# Patient Record
Sex: Male | Born: 1940 | Race: White | Hispanic: No | Marital: Married | Smoking: Never smoker
Health system: Southern US, Community
[De-identification: ages and names within clinical notes are randomized; demographics above are authoritative.]

## PROBLEM LIST (undated history)

## (undated) DIAGNOSIS — B029 Zoster without complications: Secondary | ICD-10-CM

## (undated) DIAGNOSIS — D649 Anemia, unspecified: Secondary | ICD-10-CM

## (undated) DIAGNOSIS — I5030 Unspecified diastolic (congestive) heart failure: Secondary | ICD-10-CM

## (undated) DIAGNOSIS — I482 Chronic atrial fibrillation, unspecified: Secondary | ICD-10-CM

## (undated) DIAGNOSIS — Z96641 Presence of right artificial hip joint: Secondary | ICD-10-CM

## (undated) DIAGNOSIS — R600 Localized edema: Secondary | ICD-10-CM

## (undated) HISTORY — PX: TOTAL HIP ARTHROPLASTY: SHX124

## (undated) HISTORY — PX: APPENDECTOMY: SHX54

## (undated) HISTORY — PX: CHOLECYSTECTOMY: SHX55

---

## 1998-08-22 ENCOUNTER — Encounter: Payer: Self-pay | Admitting: General Surgery

## 1998-08-27 ENCOUNTER — Ambulatory Visit (HOSPITAL_COMMUNITY): Admission: RE | Admit: 1998-08-27 | Discharge: 1998-08-27 | Payer: Self-pay | Admitting: General Surgery

## 2000-07-31 ENCOUNTER — Encounter: Payer: Self-pay | Admitting: Emergency Medicine

## 2000-07-31 ENCOUNTER — Emergency Department (HOSPITAL_COMMUNITY): Admission: EM | Admit: 2000-07-31 | Discharge: 2000-07-31 | Payer: Self-pay | Admitting: Emergency Medicine

## 2001-12-19 ENCOUNTER — Inpatient Hospital Stay (HOSPITAL_COMMUNITY): Admission: EM | Admit: 2001-12-19 | Discharge: 2001-12-21 | Payer: Self-pay | Admitting: *Deleted

## 2002-01-16 ENCOUNTER — Inpatient Hospital Stay (HOSPITAL_COMMUNITY): Admission: EM | Admit: 2002-01-16 | Discharge: 2002-01-25 | Payer: Self-pay | Admitting: Emergency Medicine

## 2002-02-20 ENCOUNTER — Ambulatory Visit (HOSPITAL_COMMUNITY): Admission: RE | Admit: 2002-02-20 | Discharge: 2002-02-20 | Payer: Self-pay | Admitting: *Deleted

## 2002-02-20 ENCOUNTER — Encounter: Payer: Self-pay | Admitting: *Deleted

## 2002-06-01 ENCOUNTER — Encounter: Payer: Self-pay | Admitting: Endocrinology

## 2002-06-01 ENCOUNTER — Ambulatory Visit (HOSPITAL_COMMUNITY): Admission: RE | Admit: 2002-06-01 | Discharge: 2002-06-01 | Payer: Self-pay | Admitting: Endocrinology

## 2002-06-04 ENCOUNTER — Emergency Department (HOSPITAL_COMMUNITY): Admission: EM | Admit: 2002-06-04 | Discharge: 2002-06-04 | Payer: Self-pay | Admitting: *Deleted

## 2003-08-27 ENCOUNTER — Ambulatory Visit (HOSPITAL_BASED_OUTPATIENT_CLINIC_OR_DEPARTMENT_OTHER): Admission: RE | Admit: 2003-08-27 | Discharge: 2003-08-27 | Payer: Self-pay | Admitting: Cardiology

## 2003-10-12 ENCOUNTER — Ambulatory Visit (HOSPITAL_COMMUNITY): Admission: AD | Admit: 2003-10-12 | Discharge: 2003-10-14 | Payer: Self-pay | Admitting: Internal Medicine

## 2003-12-11 ENCOUNTER — Inpatient Hospital Stay (HOSPITAL_COMMUNITY): Admission: AD | Admit: 2003-12-11 | Discharge: 2003-12-14 | Payer: Self-pay | Admitting: Internal Medicine

## 2004-08-07 ENCOUNTER — Ambulatory Visit: Payer: Self-pay | Admitting: Cardiology

## 2004-09-19 ENCOUNTER — Ambulatory Visit: Payer: Self-pay | Admitting: Internal Medicine

## 2005-01-15 ENCOUNTER — Ambulatory Visit: Payer: Self-pay

## 2005-02-26 ENCOUNTER — Ambulatory Visit: Payer: Self-pay | Admitting: Cardiology

## 2005-04-06 ENCOUNTER — Ambulatory Visit: Payer: Self-pay | Admitting: Cardiology

## 2005-04-23 ENCOUNTER — Ambulatory Visit: Payer: Self-pay | Admitting: Internal Medicine

## 2005-06-23 ENCOUNTER — Ambulatory Visit: Payer: Self-pay | Admitting: *Deleted

## 2005-08-19 ENCOUNTER — Ambulatory Visit: Payer: Self-pay | Admitting: Cardiology

## 2005-09-02 ENCOUNTER — Ambulatory Visit: Payer: Self-pay | Admitting: Cardiology

## 2005-10-07 ENCOUNTER — Ambulatory Visit: Payer: Self-pay | Admitting: Cardiovascular Disease

## 2005-11-25 ENCOUNTER — Ambulatory Visit: Payer: Self-pay | Admitting: Cardiology

## 2006-01-08 ENCOUNTER — Ambulatory Visit: Payer: Self-pay | Admitting: Internal Medicine

## 2006-02-24 ENCOUNTER — Ambulatory Visit: Payer: Self-pay | Admitting: Cardiology

## 2006-04-12 ENCOUNTER — Ambulatory Visit: Payer: Self-pay | Admitting: Cardiology

## 2006-05-21 ENCOUNTER — Ambulatory Visit: Payer: Self-pay | Admitting: Cardiology

## 2006-07-09 ENCOUNTER — Ambulatory Visit: Payer: Self-pay | Admitting: Internal Medicine

## 2006-09-17 ENCOUNTER — Ambulatory Visit: Payer: Self-pay | Admitting: Cardiology

## 2006-11-05 ENCOUNTER — Ambulatory Visit: Payer: Self-pay | Admitting: Internal Medicine

## 2006-11-12 ENCOUNTER — Ambulatory Visit: Payer: Self-pay | Admitting: Internal Medicine

## 2006-11-23 ENCOUNTER — Encounter: Payer: Self-pay | Admitting: Internal Medicine

## 2006-11-23 ENCOUNTER — Inpatient Hospital Stay (HOSPITAL_COMMUNITY): Admission: EM | Admit: 2006-11-23 | Discharge: 2006-11-24 | Payer: Self-pay | Admitting: Emergency Medicine

## 2006-11-23 ENCOUNTER — Ambulatory Visit: Payer: Self-pay | Admitting: Internal Medicine

## 2006-12-20 ENCOUNTER — Ambulatory Visit: Payer: Self-pay | Admitting: Cardiovascular Disease

## 2006-12-20 ENCOUNTER — Ambulatory Visit: Payer: Self-pay | Admitting: Internal Medicine

## 2007-02-11 ENCOUNTER — Ambulatory Visit: Payer: Self-pay | Admitting: Cardiovascular Disease

## 2007-04-01 ENCOUNTER — Ambulatory Visit: Payer: Self-pay | Admitting: Cardiology

## 2007-04-29 ENCOUNTER — Ambulatory Visit: Payer: Self-pay | Admitting: Cardiology

## 2007-05-27 ENCOUNTER — Ambulatory Visit: Payer: Self-pay | Admitting: Cardiology

## 2007-06-23 ENCOUNTER — Ambulatory Visit: Payer: Self-pay | Admitting: Cardiology

## 2007-06-23 ENCOUNTER — Ambulatory Visit: Payer: Self-pay | Admitting: Internal Medicine

## 2007-06-23 LAB — CONVERTED CEMR LAB
BUN: 23 mg/dL (ref 6–23)
Basophils Relative: 0.8 % (ref 0.0–1.0)
CO2: 27 meq/L (ref 19–32)
Chloride: 109 meq/L (ref 96–112)
Creatinine, Ser: 1.3 mg/dL (ref 0.4–1.5)
Eosinophils Absolute: 0.2 10*3/uL (ref 0.0–0.6)
Eosinophils Relative: 3.4 % (ref 0.0–5.0)
GFR calc Af Amer: 71 mL/min
GFR calc non Af Amer: 59 mL/min
Glucose, Bld: 176 mg/dL — ABNORMAL HIGH (ref 70–99)
HCT: 32.7 % — ABNORMAL LOW (ref 39.0–52.0)
Hemoglobin: 11.4 g/dL — ABNORMAL LOW (ref 13.0–17.0)
INR: 1.9 — ABNORMAL HIGH (ref 0.8–1.0)
Lymphocytes Relative: 22.8 % (ref 12.0–46.0)
MCHC: 35 g/dL (ref 30.0–36.0)
MCV: 92.7 fL (ref 78.0–100.0)
Monocytes Absolute: 0.5 10*3/uL (ref 0.2–0.7)
Monocytes Relative: 8.8 % (ref 3.0–11.0)
Neutro Abs: 3.5 10*3/uL (ref 1.4–7.7)
Neutrophils Relative %: 64.2 % (ref 43.0–77.0)
Platelets: 167 10*3/uL (ref 150–400)
Potassium: 3.9 meq/L (ref 3.5–5.1)
Prothrombin Time: 16.9 s — ABNORMAL HIGH (ref 10.9–13.3)
RBC: 3.53 M/uL — ABNORMAL LOW (ref 4.22–5.81)
Sodium: 142 meq/L (ref 135–145)
WBC: 5.5 10*3/uL (ref 4.5–10.5)
aPTT: 32.7 s — ABNORMAL HIGH (ref 21.7–29.8)

## 2007-06-27 ENCOUNTER — Ambulatory Visit: Payer: Self-pay | Admitting: Cardiology

## 2007-07-08 ENCOUNTER — Ambulatory Visit: Payer: Self-pay | Admitting: Cardiovascular Disease

## 2007-08-11 ENCOUNTER — Ambulatory Visit: Payer: Self-pay | Admitting: Cardiology

## 2007-09-08 ENCOUNTER — Ambulatory Visit: Payer: Self-pay | Admitting: Cardiology

## 2007-10-28 ENCOUNTER — Ambulatory Visit: Payer: Self-pay | Admitting: Internal Medicine

## 2008-01-06 ENCOUNTER — Ambulatory Visit: Payer: Self-pay | Admitting: Internal Medicine

## 2008-01-13 ENCOUNTER — Ambulatory Visit: Payer: Self-pay | Admitting: Cardiology

## 2008-02-23 ENCOUNTER — Ambulatory Visit: Payer: Self-pay | Admitting: Internal Medicine

## 2008-04-13 ENCOUNTER — Ambulatory Visit: Payer: Self-pay | Admitting: Cardiovascular Disease

## 2008-06-01 ENCOUNTER — Ambulatory Visit: Payer: Self-pay | Admitting: Internal Medicine

## 2008-07-02 ENCOUNTER — Ambulatory Visit: Payer: Self-pay | Admitting: Internal Medicine

## 2008-07-20 ENCOUNTER — Ambulatory Visit: Payer: Self-pay | Admitting: Cardiology

## 2008-09-19 ENCOUNTER — Ambulatory Visit: Payer: Self-pay | Admitting: Cardiology

## 2008-11-16 ENCOUNTER — Ambulatory Visit: Payer: Self-pay | Admitting: Internal Medicine

## 2008-11-26 ENCOUNTER — Ambulatory Visit: Payer: Self-pay | Admitting: Internal Medicine

## 2008-12-28 ENCOUNTER — Ambulatory Visit: Payer: Self-pay | Admitting: Cardiology

## 2009-01-15 ENCOUNTER — Telehealth (INDEPENDENT_AMBULATORY_CARE_PROVIDER_SITE_OTHER): Payer: Self-pay | Admitting: *Deleted

## 2009-01-15 DIAGNOSIS — I4891 Unspecified atrial fibrillation: Secondary | ICD-10-CM

## 2009-01-15 HISTORY — DX: Unspecified atrial fibrillation: I48.91

## 2009-01-29 ENCOUNTER — Telehealth (INDEPENDENT_AMBULATORY_CARE_PROVIDER_SITE_OTHER): Payer: Self-pay | Admitting: Radiology

## 2009-01-30 ENCOUNTER — Encounter: Payer: Self-pay | Admitting: Cardiology

## 2009-01-30 ENCOUNTER — Ambulatory Visit: Payer: Self-pay

## 2009-01-31 ENCOUNTER — Ambulatory Visit: Payer: Self-pay

## 2009-02-06 ENCOUNTER — Telehealth (INDEPENDENT_AMBULATORY_CARE_PROVIDER_SITE_OTHER): Payer: Self-pay | Admitting: *Deleted

## 2009-02-06 DIAGNOSIS — E1321 Other specified diabetes mellitus with diabetic nephropathy: Secondary | ICD-10-CM | POA: Insufficient documentation

## 2009-02-06 DIAGNOSIS — I10 Essential (primary) hypertension: Secondary | ICD-10-CM | POA: Insufficient documentation

## 2009-02-06 DIAGNOSIS — E669 Obesity, unspecified: Secondary | ICD-10-CM

## 2009-02-06 DIAGNOSIS — E119 Type 2 diabetes mellitus without complications: Secondary | ICD-10-CM

## 2009-02-06 HISTORY — DX: Other specified diabetes mellitus with diabetic nephropathy: E13.21

## 2009-02-06 HISTORY — DX: Essential (primary) hypertension: I10

## 2009-02-06 HISTORY — DX: Obesity, unspecified: E66.9

## 2009-02-07 ENCOUNTER — Ambulatory Visit: Payer: Self-pay | Admitting: Internal Medicine

## 2009-02-07 DIAGNOSIS — I5032 Chronic diastolic (congestive) heart failure: Secondary | ICD-10-CM

## 2009-02-07 HISTORY — DX: Chronic diastolic (congestive) heart failure: I50.32

## 2009-02-15 ENCOUNTER — Ambulatory Visit: Payer: Self-pay | Admitting: Cardiology

## 2009-02-15 LAB — CONVERTED CEMR LAB
POC INR: 3.2
Protime: 21.6

## 2009-02-19 ENCOUNTER — Encounter: Payer: Self-pay | Admitting: *Deleted

## 2009-03-27 ENCOUNTER — Encounter: Payer: Self-pay | Admitting: *Deleted

## 2009-04-30 ENCOUNTER — Encounter: Payer: Self-pay | Admitting: Cardiology

## 2009-05-07 ENCOUNTER — Encounter (INDEPENDENT_AMBULATORY_CARE_PROVIDER_SITE_OTHER): Payer: Self-pay | Admitting: *Deleted

## 2009-06-07 ENCOUNTER — Ambulatory Visit: Payer: Self-pay | Admitting: Internal Medicine

## 2009-06-07 LAB — CONVERTED CEMR LAB: POC INR: 2.9

## 2009-07-09 ENCOUNTER — Ambulatory Visit: Payer: Self-pay | Admitting: Cardiology

## 2009-07-09 ENCOUNTER — Ambulatory Visit: Payer: Self-pay | Admitting: Internal Medicine

## 2009-07-09 LAB — CONVERTED CEMR LAB: POC INR: 2.5

## 2009-07-18 ENCOUNTER — Telehealth: Payer: Self-pay | Admitting: Internal Medicine

## 2009-08-27 ENCOUNTER — Ambulatory Visit: Payer: Self-pay | Admitting: Cardiovascular Disease

## 2009-08-27 LAB — CONVERTED CEMR LAB: POC INR: 1.6

## 2009-10-15 ENCOUNTER — Ambulatory Visit: Payer: Self-pay | Admitting: Cardiovascular Disease

## 2009-10-15 LAB — CONVERTED CEMR LAB
INR: 2.5
POC INR: 2.5

## 2009-11-18 ENCOUNTER — Encounter: Payer: Self-pay | Admitting: Internal Medicine

## 2009-12-03 ENCOUNTER — Ambulatory Visit: Payer: Self-pay | Admitting: Cardiology

## 2009-12-03 LAB — CONVERTED CEMR LAB: POC INR: 3.1

## 2009-12-18 ENCOUNTER — Ambulatory Visit: Payer: Self-pay | Admitting: Internal Medicine

## 2009-12-18 ENCOUNTER — Inpatient Hospital Stay (HOSPITAL_COMMUNITY): Admission: EM | Admit: 2009-12-18 | Discharge: 2009-12-20 | Payer: Self-pay | Admitting: Emergency Medicine

## 2009-12-19 ENCOUNTER — Encounter: Payer: Self-pay | Admitting: Internal Medicine

## 2009-12-24 ENCOUNTER — Ambulatory Visit: Payer: Self-pay | Admitting: Internal Medicine

## 2009-12-25 ENCOUNTER — Telehealth: Payer: Self-pay | Admitting: Internal Medicine

## 2009-12-26 ENCOUNTER — Encounter: Payer: Self-pay | Admitting: Internal Medicine

## 2010-01-01 ENCOUNTER — Encounter: Payer: Self-pay | Admitting: Internal Medicine

## 2010-02-06 ENCOUNTER — Encounter (INDEPENDENT_AMBULATORY_CARE_PROVIDER_SITE_OTHER): Payer: Self-pay | Admitting: Pharmacist

## 2010-04-01 ENCOUNTER — Telehealth: Payer: Self-pay | Admitting: Internal Medicine

## 2010-04-01 ENCOUNTER — Encounter (INDEPENDENT_AMBULATORY_CARE_PROVIDER_SITE_OTHER): Payer: Self-pay | Admitting: Pharmacist

## 2010-04-07 ENCOUNTER — Telehealth (INDEPENDENT_AMBULATORY_CARE_PROVIDER_SITE_OTHER): Payer: Self-pay

## 2010-04-22 ENCOUNTER — Ambulatory Visit: Payer: Self-pay | Admitting: Cardiology

## 2010-04-22 LAB — CONVERTED CEMR LAB: POC INR: 1.5

## 2010-04-28 ENCOUNTER — Telehealth: Payer: Self-pay | Admitting: Internal Medicine

## 2010-06-10 ENCOUNTER — Ambulatory Visit: Payer: Self-pay | Admitting: Cardiology

## 2010-06-10 ENCOUNTER — Ambulatory Visit: Payer: Self-pay | Admitting: Internal Medicine

## 2010-06-10 LAB — CONVERTED CEMR LAB: POC INR: 1.6

## 2010-06-11 ENCOUNTER — Encounter: Payer: Self-pay | Admitting: Internal Medicine

## 2010-06-25 ENCOUNTER — Telehealth: Payer: Self-pay | Admitting: Internal Medicine

## 2010-10-17 ENCOUNTER — Encounter
Admission: RE | Admit: 2010-10-17 | Discharge: 2010-10-17 | Payer: Self-pay | Source: Home / Self Care | Attending: Endocrinology | Admitting: Endocrinology

## 2010-10-19 LAB — CONVERTED CEMR LAB
BUN: 27 mg/dL — ABNORMAL HIGH (ref 6–23)
CO2: 28 meq/L (ref 19–32)
Calcium: 9.8 mg/dL (ref 8.4–10.5)
Chloride: 103 meq/L (ref 96–112)
Creatinine, Ser: 1.5 mg/dL (ref 0.4–1.5)
GFR calc non Af Amer: 48.92 mL/min (ref >60)
Glucose, Bld: 96 mg/dL (ref 70–99)
Magnesium: 2.1 mg/dL (ref 1.5–2.5)
Potassium: 5 meq/L (ref 3.5–5.1)
Sodium: 139 meq/L (ref 135–145)

## 2010-10-23 NOTE — Medication Information (Signed)
Summary: coumadin check/mt  Anticoagulant Therapy  Managed by: Porfirio Oar, PharmD Referring MD: Parthenia Ames MD: Olevia Perches MD, Bruce Indication 1: Atrial Fibrillation (ICD-427.31) Lab Used: [facility] Site: [facility] INR POC 1.5 INR RANGE 2 - 3  Dietary changes: no    Health status changes: no    Bleeding/hemorrhagic complications: no    Recent/future hospitalizations: no    Any changes in medication regimen? no    Recent/future dental: no  Any missed doses?: no       Is patient compliant with meds? yes      Comments: Pt will be gone to [place] until September.  Will have neighbor check INR in 1-2 weeks.    Allergies: 1)  ! * Ivp Dye 2)  ! Sulfa  Anticoagulation Management History:      The patient is taking warfarin and comes in today for a routine follow up visit.  Positive risk factors for bleeding include an age of 70 years or older and presence of serious comorbidities.  The bleeding index is 'intermediate risk'.  Positive CHADS2 values include History of CHF, History of HTN, and History of Diabetes.  Negative CHADS2 values include Age > 21 years old.  The start date was 12/20/2001.  His last INR was 2.5.  Anticoagulation responsible provider: Olevia Perches MD, Darnell Level.  INR POC: 1.5.  Cuvette Lot#: [id number].  Exp: 06/2011.    Anticoagulation Management Assessment/Plan:      The patient's current anticoagulation dose is Coumadin 5 mg tabs: Take as directed by coumadin clinic..  The target INR is 2 - 3.  The next INR is due 06/10/2010.  Anticoagulation instructions were given to patient.  Results were reviewed/authorized by Porfirio Oar, PharmD.  He was notified by Porfirio Oar PharmD.         Prior Anticoagulation Instructions: INR 3.1  Hold today's dose.  Then continue taking 1 tablet daily.  Return to clinic in 7 weeks.   Current Anticoagulation Instructions: INR 1.5  Take 2 tablets today, 1 1/2 tablets on Wednesday then resume same dose of 1 tablet every day.     Prescriptions: COUMADIN 5 MG TABS (WARFARIN SODIUM) Take as directed by coumadin clinic.  #90 x 1   Entered by:   Porfirio Oar PharmD   Authorized by:   Nikki Dom, MD, [facility]   Signed by:   Porfirio Oar PharmD on 04/22/2010   Method used:   Electronically to        [facility]* (retail)             ,          Ph: [phone]       Fax: [phone]   RxID:   [id number]

## 2010-10-23 NOTE — Letter (Signed)
Summary: [facility]  [facility]   Imported By: Marilynne Drivers 03/19/2010 09:27:15  _____________________________________________________________________  External Attachment:    Type:   Image     Comment:   External Document

## 2010-10-23 NOTE — Letter (Signed)
Summary: [facility] - Delinquent Coumadin 1  Coumadin  [address] [place]   [place], [place] [address]   Phone: [phone]  Fax: [phone]     Feb 06, 2010 MRN: [id number]   [place], [place]  [address]   Dear Eric [name],  This letter is being sent to you as a reminder that it is necessary for you to get your INR/PT checked regularly so that we can optimize your care.  Our records indicate that you were scheduled to have a test done recently.  As of today, we have not received the results of this test.  It is very important that you have your INR checked.  Please call our office at the number listed above to schedule an appointment at your earliest convenience.    If you have recently had your protime checked or have discontinued this medication, please contact our office at the above phone number to clarify this issue.  Thank you for this prompt attention to this important health care matter.  Sincerely,   [facility]

## 2010-10-23 NOTE — Medication Information (Signed)
Summary: rov/sp  Anticoagulant Therapy  Managed by: Mammie Lorenzo, PharmD Referring MD: Parthenia Ames MD: Verl Blalock MD,Thomas Indication 1: Atrial Fibrillation (ICD-427.31) Lab Used: [place] Site: [facility] INR POC 1.6 INR RANGE 2 - 3  Dietary changes: no    Health status changes: no    Bleeding/hemorrhagic complications: no    Recent/future hospitalizations: no    Any changes in medication regimen? no    Recent/future dental: no  Any missed doses?: no       Is patient compliant with meds? yes      Comments: Patient lives out of town. 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 Strongly advised him to come back sooner than 7 weeks so we can recheck. He declined and stated that 7 weeks was the best he could do. Has a retired physician that lives next door with an INR machine. Told him to have the physician check it and to call us with the result in 2 weeks to make sure he is back up.   Allergies: 1)  ! * Ivp Dye 2)  ! Sulfa  Anticoagulation Management History:      The patient is taking warfarin and comes in today for a routine follow up visit.  Positive risk factors for bleeding include an age of 89 years or older and presence of serious comorbidities.  The bleeding index is 'intermediate risk'.  Positive CHADS2 values include History of CHF, History of HTN, and History of Diabetes.  Negative CHADS2 values include Age > 2 years old.  The start date was 12/20/2001.  His last INR was 2.5.  Anticoagulation responsible Clarrissa Shimkus: Wall MD,Thomas.  INR POC: 1.6.  Cuvette Lot#: [id number].  Exp: 06/2011.    Anticoagulation Management Assessment/Plan:      The patient's current anticoagulation dose is Coumadin 5 mg tabs: Take as directed by coumadin clinic..  The target INR is 2 - 3.  The next INR is due 07/29/2010.  Anticoagulation instructions were given to patient.  Results were reviewed/authorized by Mammie Lorenzo, PharmD.         Prior Anticoagulation Instructions: INR  1.5  Take 2 tablets today, 1 1/2 tablets on Wednesday then resume same dose of 1 tablet every day.    Current Anticoagulation Instructions: INR 1.6  Take 2 tablets today as a 1 time booster. Change your wednesday dose to 1.5 tablets instead of just 1 tablet. We'll see you in 7 weeks.

## 2010-10-23 NOTE — Assessment & Plan Note (Signed)
Summary: 4 month return.amber   Visit Type:  Follow-up Referring Martise Waddell:  Elsie Saas Primary Tonnie Friedel:  Margaretmary Dys   History of Present Illness: Eric [name] is seen in followup for atrial fibrillation occurring in the context of diabetes hypertension and morbid obesity. He is taking Tikosyn.   Review of hospital  records and office labs demonstrates no K since April   He  has been actively engaged in physical therapy and has lost 30 lbs  His goal is anothe 50   His atrial fibrillation is quite paroxysmal; he is quite well able to discern when he is in sinus and when he is not. He does not have significant symptoms associated with his episodes of atrial fibrillation; this dancing distinction to his presentation 7 or 8 years ago when he was quite sick with atrial fibrillation. His records will need to be reviewed   Current Medications (verified): 1)  Tikosyn 250 Mcg Caps (Dofetilide) .... Take One Tablet Every 12 Hours 2)  Celebrex 200 Mg Caps (Celecoxib) .... Take One Tablet Once Daily 3)  Simvastatin 80 Mg Tabs (Simvastatin) .... Take One Tablet Daily With Dinner Meal 4)  Diltiazem Hcl Cr 240 Mg Xr24h-Cap (Diltiazem Hcl) .... Take One Tablet Two Times A Day 5)  Levoxyl 75 Mcg Tabs (Levothyroxine Sodium) .... Take One Tablet Once Daily 6)  Spironolactone 50 Mg Tabs (Spironolactone) .... Take One Tablet Once Daily 7)  Actos 45 Mg Tabs (Pioglitazone Hcl) .... Take One Tablet Once Daily 8)  Furosemide 40 Mg Tabs (Furosemide) .... Take One Tablet Am 9)  Allopurinol 300 Mg Tabs (Allopurinol) .... Take One Tablet Once Daily 10)  Omeprazole 20 Mg Cpdr (Omeprazole) .... Take One Tablet Once Daily 11)  Lantus 100 Unit/ml Soln (Insulin Glargine) .... Take 34 Units Once Daily 12)  Novolog 100 Unit/ml Soln (Insulin Aspart) .... Take 12 Units Before Every Meal 13)  Aspirin Adult Low Strength 81 Mg Tbec (Aspirin) .... Take One Tablet Once Daily 14)  Eq Fiber Therapy 500 Mg Tabs  (Methylcellulose (Laxative)) .... Take 2 Tablets Daily 15)  Multivitamins  Tabs (Multiple Vitamin) .... Take One Tablet Once Daily 16)  Mag-Oxide 400 Mg Tabs (Magnesium Oxide) .... Take 2 Tablets With Breakfast 17)  Calcium 600 600 Mg Tabs (Calcium Carbonate) .... Take One Tablet Once Daily 18)  Tylenol Pm Extra Strength 500-25 Mg Tabs (Diphenhydramine-Apap (Sleep)) .... Take Two Tablets At Bedtime 19)  Coumadin 5 Mg Tabs (Warfarin Sodium) .... Take As Directed By Coumadin Clinic.  Allergies: 1)  ! * Ivp Dye 2)  ! Sulfa  Past History:  Past Medical History: Last updated: 02/06/2009  1. Cardiac catheterization, March 2003, 30% proximal LAD, 60% mid LAD,      30% mid RCA, LV pressure 138/38.  2. Echocardiogram, May 2003, left ventricular ejection fraction 50% to      55%, dilated left atrium, mild pulmonary regurgitation.  3. 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  4. History of congestive heart failure, likely secondary to diastolic      dysfunction.  5. Diabetes mellitus type 2.  6. Hypertension.  7. Obstructive sleep apnea.  8. Gout.  9. Hypothyroidism.  Vital Signs:  Patient profile:   70 year old male Height:      72 inches Weight:      297 pounds BMI:     40.43 Pulse rate:   60 / minute BP  sitting:   132 / 60  (left arm)  Vitals Entered By: Margaretmary Bayley CMA (June 10, 2010 3:22 PM)  Physical Exam  General:  The patient morbidly obese was alert and oriented in no acute distress. HEENT Normal.  Neck veins were flat, carotids were brisk.  Lungs were clear.  Heart sounds were regular without murmurs or gallops.  Abdomen was soft with active bowel sounds. There is no clubbing cyanosis or edema. Skin Warm and dry with venous insufficiency changes    EKG  Procedure date:  06/10/2010  Findings:      sinus rhythm at 60 Intervals 0.21/0.10/0.44 Otherwise  normal  Impression & Recommendations:  Problem # 1:  ATRIAL FIBRILLATION (ICD-427.31) Mr. Hundertmark will continue on Tikosyn. We will check his magnesium and b-met for Tikosyn.  a long discussion today regarding Coumadin versus Pradaxa including the risks and benefits. He would like to begin to take Pradaxa. We have given him one month of samples.  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) heart failure is stable at this point he is euvolemic Orders: TLB-BMP (Basic Metabolic Panel-BMET) (99991111) TLB-Magnesium (Mg) (83735-MG)  Problem # 3:  ESSENTIAL HYPERTENSION, BENIGN (ICD-401.1) blood pressure is stable; will continue current meds  Orders: TLB-BMP (Basic Metabolic Panel-BMET) (99991111) TLB-Magnesium (Mg) (83735-MG)  Problem # 4:  OBESITY (ICD-278.00) continue to work on losing weight  Patient Instructions: 1)  Your physician recommends that you schedule a follow-up appointment in: 6 months 2)  Your physician has recommended you make the following change in your medication: stop aspirin and began Pradaxa side effects reviewed

## 2010-10-23 NOTE — Medication Information (Signed)
Summary: rov/ez  Anticoagulant Therapy  Managed by: Margaretha Sheffield, PharmD Referring MD: Parthenia Ames MD: Ron Parker MD, Dellis Filbert Indication 1: Atrial Fibrillation (ICD-427.31) Lab Used: [facility] Site: [facility] INR POC 3.1 INR RANGE 2 - 3  Dietary changes: no    Health status changes: no    Bleeding/hemorrhagic complications: no    Recent/future hospitalizations: no    Any changes in medication regimen? no    Recent/future dental: no  Any missed doses?: no       Is patient compliant with meds? yes       Allergies: 1)  ! * Ivp Dye  Anticoagulation Management History:      The patient is taking warfarin and comes in today for a routine follow up visit.  Positive risk factors for bleeding include an age of 70 years or older and presence of serious comorbidities.  The bleeding index is 'intermediate risk'.  Positive CHADS2 values include History of CHF, History of HTN, and History of Diabetes.  Negative CHADS2 values include Age > 70 years old.  The start date was 12/20/2001.  His last INR was 2.5.  Anticoagulation responsible provider: Ron Parker MD, Dellis Filbert.  INR POC: 3.1.  Cuvette Lot#: [id number].  Exp: 01/2011.    Anticoagulation Management Assessment/Plan:      The patient's current anticoagulation dose is Coumadin 5 mg tabs: Take as directed by coumadin clinic..  The target INR is 2 - 3.  The next INR is due 01/21/2010.  Anticoagulation instructions were given to patient.  Results were reviewed/authorized by Margaretha Sheffield, PharmD.  He was notified by Margaretha Sheffield.         Prior Anticoagulation Instructions: INR: 2.5 Continue with same dosage of 5mg  tablet daily Recheck in 4 weeks Patient would like to come in 7 weeks because lives in [place] and has a Dr to check INR every 3 weeks  Current Anticoagulation Instructions: INR 3.1  Hold today's dose.  Then continue taking 1 tablet daily.  Return to clinic in 7 weeks.

## 2010-10-23 NOTE — Letter (Signed)
Summary: Physical Therapy Form  Physical Therapy Form   Imported By: Sallee Provencal 12/31/2009 14:10:55  _____________________________________________________________________  External Attachment:    Type:   Image     Comment:   External Document

## 2010-10-23 NOTE — Progress Notes (Signed)
Summary: refill meds  Phone Note Call from Patient Call back at Home Phone [phone]   Caller: Patient Reason for Call: Talk to Nurse Summary of Call: per pt he is on pradaxa. pt needs his pradaxa called [facility]. he was taking samples for now. please call in ASAP. He has about 12 days left take 7 days to get this to him Initial call taken by: Lorraine Lax,  June 25, 2010 3:35 PM  Follow-up for Phone Call        06/25/10 1600 pt line busy. Joan Flores, RN, BSN  Additional Follow-up for Phone Call Additional follow up Details #1::        pt meds ordered. Joan Flores, RN, BSN    New/Updated Medications: PRADAXA 150 MG CAPS (DABIGATRAN ETEXILATE MESYLATE) two times a day Prescriptions: PRADAXA 150 MG CAPS (DABIGATRAN ETEXILATE MESYLATE) two times a day  #180 x 3   Entered by:   Joan Flores RN   Authorized by:   Nikki Dom, MD, [employer]   Signed by:   Joan Flores RN on 06/25/2010   Method used:   Electronically to        [facility]* (retail)             ,          Ph: [phone]       Fax: [phone]   RxID:   [id number]

## 2010-10-23 NOTE — Progress Notes (Signed)
Summary: wants letter to get off jury duty  Phone Note Call from Patient   Caller: Patient Reason for Call: Talk to Nurse Summary of Call: pt calling to see if he can get a letter to get off jury duty-pls call [phone] Initial call taken by: Lorenda Hatchet,  April 28, 2010 11:00 AM  Follow-up for Phone Call        pt would like letter to excuse him from jury duty, he is sch to report on 8/30 to [facility], his Juror # [phone], will have Dr Caryl Comes completed and pt request letter then be mailed to him at his home so he can attach it to his form to mail in [place], RN  April 28, 2010 12:10 PM   Additional Follow-up for Phone Call Additional follow up Details #1::        not sure taht medical justifiaton for relaease is indicated  thanks Additional Follow-up by: Nikki Dom, MD, [facility],  April 28, 2010 2:05 PM    Additional Follow-up for Phone Call Additional follow up Details #2::    pt aware Fredia Beets, RN  April 29, 2010 11:12 AM

## 2010-10-23 NOTE — Letter (Signed)
Summary: [facility] - Delinquent Coumadin 2  Coumadin  [address] [place]   [place], [place] [address]   Phone: [phone]  Fax: [phone]     April 01, 2010 MRN: [id number]   Eric [name],   [id number]   Dear [name],  We have attempted to contact you by phone and letter on multiple occasions to contact our office for important blood work associated with the blood thinner, warfarin (Coumadin).  Warfarin is a very important drug that can cause life threatening side effects including, bleeding, and thus requires close laboratory monitoring.  We are unable to accept responsibility for blood thinner-related health problems you may develop because you have not followed our recommendations for appropriate monitoring.  These may include abnormal bleeding occurrences and/or development of blood clots (stroke, heart attack, blood clots in legs or lungs, etc.).  We need for you to contact this office at the number listed above to schedule and complete this very important blood work.  Thank you for your assistance in this urgent matter.  Sincerely,  [facility]

## 2010-10-23 NOTE — Progress Notes (Signed)
Summary: refill meds  Phone Note Refill Request Call back at Home Phone [phone] Message from:  Patient on April 01, 2010 11:27 AM  Refills Requested: Medication #1:  TIKOSYN 250 MCG CAPS take one tablet every 12 hours   Supply Requested: 3 months  Medication #2:  FUROSEMIDE 40 MG TABS take one tablet am   Supply Requested: 3 months [facility] mail order 90 day supply with 3 refill    Method Requested: Fax to [facility] Initial call taken by: Neil Crouch,  April 01, 2010 11:27 AM  Follow-up for Phone Call        Rx faxed to pharmacy Follow-up by: Sidney Ace,  April 01, 2010 2:11 PM    Prescriptions: FUROSEMIDE 40 MG TABS (FUROSEMIDE) take one tablet am  #90 Tablet x 2   Entered by:   Sidney Ace   Authorized by:   Nikki Dom, MD, [facility]   Signed by:   Sidney Ace on 04/01/2010   Method used:   Faxed to ...       [facility]* (retail)             ,          Ph: [phone]       Fax: [phone]   RxID:   [id number] TIKOSYN 250 MCG CAPS (DOFETILIDE) take one tablet every 12 hours  #180 Capsule x 0   Entered by:   Sidney Ace   Authorized by:   Nikki Dom, MD, [facility]   Signed by:   Sidney Ace on 04/01/2010   Method used:   Faxed to ...       [facility] (retail)             ,          Ph: [phone]       Fax: [phone]   RxID:   [id number]

## 2010-10-23 NOTE — Medication Information (Signed)
Summary: rov/ewj  Anticoagulant Therapy  Managed by: Alinda Deem, PharmD, BCPS, CPP Referring MD: Parthenia Ames MD: Burt Knack MD, Legrand Como Indication 1: Atrial Fibrillation (ICD-427.31) Lab Used: [facility] Site: [facility] INR POC 2.5 INR RANGE 2 - 3  Dietary changes: no    Health status changes: no    Bleeding/hemorrhagic complications: no    Recent/future hospitalizations: no    Any changes in medication regimen? no    Recent/future dental: no  Any missed doses?: no       Is patient compliant with meds? yes       Allergies (verified): 1)  ! * Ivp Dye  Anticoagulation Management History:      The patient is taking warfarin and comes in today for a routine follow up visit.  Positive risk factors for bleeding include an age of 70 years or older and presence of serious comorbidities.  The bleeding index is 'intermediate risk'.  Positive CHADS2 values include History of CHF, History of HTN, and History of Diabetes.  Negative CHADS2 values include Age > 39 years old.  The start date was 12/20/2001.  His last INR was 1.9 RATIO and today's INR is 2.5.  Anticoagulation responsible provider: Burt Knack MD, Legrand Como.  INR POC: 2.5.  Cuvette Lot#: [id number].  Exp: 12/2010.    Anticoagulation Management Assessment/Plan:      The patient's current anticoagulation dose is Coumadin 5 mg tabs: Take as directed by coumadin clinic..  The target INR is 2 - 3.  The next INR is due 12/03/2009.  Anticoagulation instructions were given to patient.  Results were reviewed/authorized by Alinda Deem, PharmD, BCPS, CPP.  He was notified by Merlyn Albert, Pharm D Candidate.         Prior Anticoagulation Instructions: INR 1.6  Take 1.5 tablets today then resume same dosage 5mg  daily.    Current Anticoagulation Instructions: INR: 2.5 Continue with same dosage of 5mg  tablet daily Recheck in 4 weeks Patient would like to come in 7 weeks because lives in [place] and has a Dr to check INR every 3  weeks

## 2010-10-23 NOTE — Medication Information (Signed)
Summary: Coumadin Clinic  Anticoagulant Therapy  Managed by: Inactive Referring MD: Olin Pia Supervising MD: Verl Blalock MD,Thomas Indication 1: Atrial Fibrillation (ICD-427.31) Lab Used: [facility] Site: [facility] INR RANGE 2 - 3          Comments: Pt saw Dr. Caryl Comes on 9/20.  Switched from Coumadin to Pradaxa.   Allergies: 1)  ! * Ivp Dye 2)  ! Sulfa  Anticoagulation Management History:      Positive risk factors for bleeding include an age of 70 years or older and presence of serious comorbidities.  The bleeding index is 'intermediate risk'.  Positive CHADS2 values include History of CHF, History of HTN, and History of Diabetes.  Negative CHADS2 values include Age > 75 years old.  The start date was 12/20/2001.  His last INR was 2.5.  Anticoagulation responsible Avaiah Stempel: Wall MD,Thomas.  Exp: 06/2011.    Anticoagulation Management Assessment/Plan:      The patient's current anticoagulation dose is Coumadin 5 mg tabs: Take as directed by coumadin clinic..  The target INR is 2 - 3.  The next INR is due 07/29/2010.  Anticoagulation instructions were given to patient.  Results were reviewed/authorized by Inactive.         Prior Anticoagulation Instructions: INR 1.6  Take 2 tablets today as a 1 time booster. Change your wednesday dose to 1.5 tablets instead of just 1 tablet. We'll see you in 7 weeks.

## 2010-10-23 NOTE — Letter (Signed)
Summary: [facility]  [facility]   Imported By: Marilynne Drivers 12/27/2009 09:30:10  _____________________________________________________________________  External Attachment:    Type:   Image     Comment:   External Document

## 2010-10-23 NOTE — Progress Notes (Signed)
Summary: Overdue for coumadin follow-up  Phone Note Outgoing Call   Call placed by: Freddrick March RN,  April 07, 2010 11:20 AM Call placed to: Patient Summary of Call: Called spoke with pt.  Pt states he has recently undergone surgery for CA and is recovering at present.  Pt has been having PT/INR checked in [place], his next door neighbor checks and he has been right target.  Pt is in town at present and he is going to schedule a coumadin visit and an appt with Dr Caryl Comes on same day in the next week or so.   Initial call taken by: Freddrick March RN,  April 07, 2010 11:27 AM

## 2010-10-23 NOTE — Assessment & Plan Note (Signed)
Summary: per check out/sf      Allergies Added: ! SULFA  Referring Provider:  Elsie Saas  CC:  abdominal pain.  History of Present Illness: Eric [name] is seen in followup for atrial fibrillation occurring in the context of diabetes hypertension and morbid obesity. He is taking Tikosyn.   History salient hospitalized for acute renal cell that occurred in the context of Celebrex, antibiotics, and rapid atrial fibrillation.  That all resolved and he was discharged a few days ago. His atrial fibrillation is quite paroxysmal; he is quite well able to discern when he is in sinus and when he is not. He does not have significant symptoms associated with his episodes of atrial fibrillation; this dancing distinction to his presentation 7 or 8 years ago when he was quite sick with atrial fibrillation. His records will need to be reviewed  Current Medications (verified): 1)  Tikosyn 250 Mcg Caps (Dofetilide) .... Take One Tablet Every 12 Hours 2)  Celebrex 200 Mg Caps (Celecoxib) .... Take One Tablet Once Daily 3)  Simvastatin 80 Mg Tabs (Simvastatin) .... Take One Tablet Daily With Dinner Meal 4)  Diltiazem Hcl Cr 240 Mg Xr24h-Cap (Diltiazem Hcl) .... Take One Tablet Two Times A Day 5)  Levoxyl 75 Mcg Tabs (Levothyroxine Sodium) .... Take One Tablet Once Daily 6)  Spironolactone 50 Mg Tabs (Spironolactone) .... Take One Tablet Once Daily 7)  Actos 45 Mg Tabs (Pioglitazone Hcl) .... Take One Tablet Once Daily 8)  Furosemide 40 Mg Tabs (Furosemide) .... Take One Tablet Am 9)  Allopurinol 300 Mg Tabs (Allopurinol) .... Take One Tablet Once Daily 10)  Omeprazole 20 Mg Cpdr (Omeprazole) .... Take One Tablet Once Daily 11)  Lantus 100 Unit/ml Soln (Insulin Glargine) .... Take 34 Units Once Daily 12)  Novolog 100 Unit/ml Soln (Insulin Aspart) .... Take 12 Units Before Every Meal 13)  Aspirin Adult Low Strength 81 Mg Tbec (Aspirin) .... Take One Tablet Once Daily 14)  Eq Fiber Therapy 500 Mg Tabs  (Methylcellulose (Laxative)) .... Take 2 Tablets Daily 15)  Multivitamins  Tabs (Multiple Vitamin) .... Take One Tablet Once Daily 16)  Mag-Oxide 400 Mg Tabs (Magnesium Oxide) .... Take 2 Tablets With Breakfast 17)  Calcium 600 600 Mg Tabs (Calcium Carbonate) .... Take One Tablet Once Daily 18)  Tylenol Pm Extra Strength 500-25 Mg Tabs (Diphenhydramine-Apap (Sleep)) .... Take Two Tablets At Bedtime 19)  Coumadin 5 Mg Tabs (Warfarin Sodium) .... Take As Directed By Coumadin Clinic.  Allergies (verified): 1)  ! * Ivp Dye 2)  ! Sulfa  Past History:  Past Medical History: Last updated: 02-19-2009  1. Cardiac catheterization, March 2003, 30% proximal LAD, 60% mid LAD,      30% mid RCA, LV pressure 138/38.  2. Echocardiogram, May 2003, left ventricular ejection fraction 50% to      55%, dilated left atrium, mild pulmonary regurgitation.  3. 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  4. History of congestive heart failure, likely secondary to diastolic      dysfunction.  5. Diabetes mellitus type 2.  6. Hypertension.  7. Obstructive sleep apnea.  8. Gout.  9. Hypothyroidism.  Family History: Last updated: 02/19/2009  Mother died at age 57 of congestive heart failure.  Father died of Alzheimer's disease at an older age.  He is an only  child.  Social History: Last updated: 2009/02/19 Retired - [employer] Married  Tobacco Use - Yes. - 30-40 ppy Alcohol Use - yes-admits often binging Regular Exercise - no Drug Use - no  Vital Signs:  Patient profile:   70 year old male Height:      72 inches Weight:      308 pounds BMI:     41.92 Pulse rate:   61 / minute Pulse rhythm:   regular BP sitting:   123 / 58  (left arm) Cuff size:   large  Vitals Entered By: Doug Sou CMA (December 24, 2009 3:26 PM)  Physical Exam  General:  The patient morbidly obese was alert and oriented in  no acute distress. HEENT Normal.  Neck veins were flat, carotids were brisk.  Lungs were clear.  Heart sounds were regular without murmurs or gallops.  Abdomen was soft with active bowel sounds. There is no clubbing cyanosis or edema. Skin Warm and dry     EKG  Procedure date:  12/24/2009  Findings:      sus rhythm at 65 Intervals 0.21/0.10/0.42 Axis is 52 otherwise  Impression & Recommendations:  Problem # 1:  ATRIAL FIBRILLATION (ICD-427.31) The patient has paroxysmal atrial fibrillation. He is on Coumadin. We will discontinue his aspirin.  We will review at his next visit the old data regarding his symptoms when he presented with atrial fibrillation. This may help [place] make a decision regarding rate versus rhythm control.  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

## 2010-10-23 NOTE — Progress Notes (Signed)
Summary: NEED A PRESCRIPTION FOR PHYSICAL THERAPY   Phone Note From Other Clinic Call back at [phone]   Caller: [facility] Summary of Call: MRS.JACKIE IS CALLING TO GET A ORDER FOR PT TO HAVE PHYSICAL THERAPY NEED A( PRESCRIPTION 0 Initial call taken by: Delsa Sale,  December 25, 2009 2:56 PM  Follow-up for Phone Call        Spoke with Sharyn Lull at above number. She states pt would like to have PT at their office. She will fax referral form to [place].  May also send prescription in place of referral.  Fax number is [phone].  Will discuss with Dr. Caryl Comes. Thompson Grayer, RN, BSN  December 26, 2009 8:42 AM Spoke with pt and verified he would like to attend PT at [facility].  Referral faxed. Follow-up by: Thompson Grayer, RN, BSN,  December 27, 2009 9:09 AM

## 2010-11-14 ENCOUNTER — Encounter (INDEPENDENT_AMBULATORY_CARE_PROVIDER_SITE_OTHER): Payer: Self-pay | Admitting: *Deleted

## 2010-11-18 NOTE — Letter (Signed)
Summary: Appointment - [place], [place]  [address]   [place], [place] [address]   Phone: [phone]  Fax: [phone]     November 14, 2010 MRN: [id number]   Eric [name] [address] [place], [place]  [address]   Dear Mr. LOOK,   Due to a change in our office schedule, your appointment on 11-26-2010                     at  3:30 p.m.  must be changed.  It is very important that we reach you to reschedule this appointment. We look forward to participating in your health care needs. Please contact us at the number listed above at your earliest convenience to reschedule this appointment.     Sincerely,      [place] Scheduling Team

## 2010-11-26 ENCOUNTER — Ambulatory Visit: Payer: Self-pay | Admitting: Internal Medicine

## 2010-11-27 ENCOUNTER — Telehealth (INDEPENDENT_AMBULATORY_CARE_PROVIDER_SITE_OTHER): Payer: Self-pay | Admitting: *Deleted

## 2010-12-02 NOTE — Progress Notes (Signed)
  Lov,Cath faxed to [facility] @ [phone] [facility]  November 27, 2010 10:34 AM

## 2010-12-10 LAB — BASIC METABOLIC PANEL
BUN: 25 mg/dL — ABNORMAL HIGH (ref 6–23)
CO2: 23 mEq/L (ref 19–32)
Calcium: 8.8 mg/dL (ref 8.4–10.5)
Chloride: 109 mEq/L (ref 96–112)
Creatinine, Ser: 1.48 mg/dL (ref 0.4–1.5)
GFR calc Af Amer: 57 mL/min — ABNORMAL LOW (ref >60)
GFR calc non Af Amer: 47 mL/min — ABNORMAL LOW (ref >60)
Glucose, Bld: 165 mg/dL — ABNORMAL HIGH (ref 70–99)
Potassium: 4.6 mEq/L (ref 3.5–5.1)
Sodium: 137 mEq/L (ref 135–145)

## 2010-12-10 LAB — CBC
HCT: 29 % — ABNORMAL LOW (ref 39.0–52.0)
Hemoglobin: 10.2 g/dL — ABNORMAL LOW (ref 13.0–17.0)
MCHC: 35.1 g/dL (ref 30.0–36.0)
MCV: 90 fL (ref 78.0–100.0)
Platelets: 139 10*3/uL — ABNORMAL LOW (ref 150–400)
RBC: 3.22 MIL/uL — ABNORMAL LOW (ref 4.22–5.81)
RDW: 19.6 % — ABNORMAL HIGH (ref 11.5–15.5)
WBC: 5.6 10*3/uL (ref 4.0–10.5)

## 2010-12-10 LAB — GLUCOSE, CAPILLARY
Glucose-Capillary: 190 mg/dL — ABNORMAL HIGH (ref 70–99)
Glucose-Capillary: 194 mg/dL — ABNORMAL HIGH (ref 70–99)

## 2010-12-12 LAB — CROSSMATCH
ABO/RH(D): O POS
Antibody Screen: NEGATIVE

## 2010-12-12 LAB — URINALYSIS, ROUTINE W REFLEX MICROSCOPIC
Bilirubin Urine: NEGATIVE
Hgb urine dipstick: NEGATIVE
Nitrite: NEGATIVE
Specific Gravity, Urine: 1.011 (ref 1.005–1.030)
pH: 7.5 (ref 5.0–8.0)

## 2010-12-12 LAB — BASIC METABOLIC PANEL
Calcium: 8.3 mg/dL — ABNORMAL LOW (ref 8.4–10.5)
GFR calc Af Amer: 30 mL/min — ABNORMAL LOW (ref >60)
GFR calc Af Amer: 34 mL/min — ABNORMAL LOW (ref >60)
GFR calc non Af Amer: 24 mL/min — ABNORMAL LOW (ref >60)
GFR calc non Af Amer: 28 mL/min — ABNORMAL LOW (ref >60)
Glucose, Bld: 312 mg/dL — ABNORMAL HIGH (ref 70–99)
Potassium: 4.7 mEq/L (ref 3.5–5.1)
Sodium: 133 mEq/L — ABNORMAL LOW (ref 135–145)
Sodium: 138 mEq/L (ref 135–145)

## 2010-12-12 LAB — CBC
HCT: 29.2 % — ABNORMAL LOW (ref 39.0–52.0)
Hemoglobin: 10.4 g/dL — ABNORMAL LOW (ref 13.0–17.0)
Hemoglobin: 8.7 g/dL — ABNORMAL LOW (ref 13.0–17.0)
MCHC: 35.6 g/dL (ref 30.0–36.0)
MCV: 91.2 fL (ref 78.0–100.0)
Platelets: 163 10*3/uL (ref 150–400)
RBC: 2.73 MIL/uL — ABNORMAL LOW (ref 4.22–5.81)
RBC: 3.21 MIL/uL — ABNORMAL LOW (ref 4.22–5.81)
RDW: 19.1 % — ABNORMAL HIGH (ref 11.5–15.5)
WBC: 6 10*3/uL (ref 4.0–10.5)
WBC: 6.8 10*3/uL (ref 4.0–10.5)

## 2010-12-12 LAB — POCT I-STAT, CHEM 8
BUN: 61 mg/dL — ABNORMAL HIGH (ref 6–23)
Calcium, Ion: 1.16 mmol/L (ref 1.12–1.32)
TCO2: 21 mmol/L (ref 0–100)

## 2010-12-12 LAB — PROTIME-INR
INR: 2.85 — ABNORMAL HIGH (ref 0.00–1.49)
INR: 3.02 — ABNORMAL HIGH (ref 0.00–1.49)
Prothrombin Time: 29.7 seconds — ABNORMAL HIGH (ref 11.6–15.2)
Prothrombin Time: 31.1 seconds — ABNORMAL HIGH (ref 11.6–15.2)

## 2010-12-12 LAB — HEMOGLOBIN A1C
Hgb A1c MFr Bld: 8.2 % — ABNORMAL HIGH (ref 4.6–6.1)
Mean Plasma Glucose: 189 mg/dL

## 2010-12-12 LAB — DIFFERENTIAL
Basophils Absolute: 0.1 10*3/uL (ref 0.0–0.1)
Basophils Relative: 1 % (ref 0–1)
Eosinophils Absolute: 0.2 10*3/uL (ref 0.0–0.7)
Eosinophils Relative: 3 % (ref 0–5)
Lymphocytes Relative: 16 % (ref 12–46)
Lymphs Abs: 1.1 10*3/uL (ref 0.7–4.0)
Monocytes Absolute: 0.5 10*3/uL (ref 0.1–1.0)
Monocytes Relative: 8 % (ref 3–12)
Neutro Abs: 4.9 10*3/uL (ref 1.7–7.7)
Neutrophils Relative %: 73 % (ref 43–77)

## 2010-12-12 LAB — TSH: TSH: 5.553 u[IU]/mL — ABNORMAL HIGH (ref 0.350–4.500)

## 2010-12-12 LAB — GLUCOSE, CAPILLARY
Glucose-Capillary: 205 mg/dL — ABNORMAL HIGH (ref 70–99)
Glucose-Capillary: 220 mg/dL — ABNORMAL HIGH (ref 70–99)

## 2010-12-12 LAB — TROPONIN I: Troponin I: <0.01 ng/mL (ref 0.00–0.06)

## 2010-12-12 LAB — POTASSIUM: Potassium: 6.2 mEq/L — ABNORMAL HIGH (ref 3.5–5.1)

## 2010-12-24 ENCOUNTER — Encounter: Payer: Self-pay | Admitting: Internal Medicine

## 2011-01-08 ENCOUNTER — Telehealth: Payer: Self-pay | Admitting: Internal Medicine

## 2011-01-08 NOTE — Telephone Encounter (Signed)
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  His physician at [facility] is not enrolled in [facility] program and for that reason I gave one fill until pt can contact new cardio office and get physician that can fill.

## 2011-02-03 NOTE — Assessment & Plan Note (Signed)
[place]                         ELECTROPHYSIOLOGY OFFICE NOTE   NAME:COBLEJerin, [name]                          MRN:          [id number]  DATE:07/02/2008                            DOB:          06-02-1941    Eric [name] came in followup for obesity and atrial fibrillation with  controlled ventricular response.  He has no complaints at this point of  chest pain or shortness of breath.  He has intercurrently stopped  drinking alcohol.  His medication list is reviewed.  I cannot tell if he  is any longer on Tikosyn.   His weight was 321, his blood pressure was 147/62, his pulse was 66 and  irregular.  His lungs were clear.  Heart sounds were irregular.  Extremities had trace edema.   Electrocardiogram demonstrated atrial fibrillation at a rate of 78 with  intervals of -/0.10/0.40.  The axis was 53 degrees.   IMPRESSION:  1. Paroxysmal, now persistent, atrial fibrillation.  2. Obesity.  3. Diabetes.  4. Hypertension.  5. Coumadin therapy for the above.   I have encouraged Eric [name] to work on [employer] as his next major project.  He agrees to this.  We will plan to see him again in one year's time.     Deboraha Sprang, MD, [facility]  Electronically Signed    SCK/MedQ  DD: 07/02/2008  DT: 07/03/2008  Job #: [id number]

## 2011-02-03 NOTE — Assessment & Plan Note (Signed)
[facility] OFFICE NOTE   NAME:COBLEKyland, [name]                          MRN:          [id number]  DATE:06/23/2007                            DOB:          [birth year]    [name] comes in today.  He is in atrial fibrillation.  He has had  problems with paroxysmal atrial fibrillation which has been going on for  sometime. The involvement that he had with the police in March has gone  through a variety of stages and he suggests that these episodes have  more frequent since then.  He was not aware of his atrial fibrillation  until he was told about it today.   CURRENT MEDICATIONS:  Aspirin, insulin, Amaryl, Zocor, cardia 240, Actos  5, allopurinol, Coumadin, and Tikosyn 250 twice daily.   PHYSICAL EXAMINATION:  VITAL SIGNS: Blood pressure 115/78, pulse 114 and  regular.  LUNGS:  Clear.  CARDIAC:  Heart sounds were regular.  EXTREMITIES:  Without edema.   IMPRESSION:  1. Paroxysmal atrial fibrillation.  2. Morbid obesity.  3. Diabetes.  4. Hypertension.   Mr. Zajac is back in atrial fibrillation.  His INR is subtherapeutic at  1.7.  We will plan to bring him to the hospital next Tuesday for TEE  guided cardioversion.  Will need to plan to check his INR on Monday to  make sure that it is therapeutic.   I will see him again otherwise in 12 months.     Deboraha Sprang, MD, [facility]  Electronically Signed    SCK/MedQ  DD: 06/23/2007  DT: 06/23/2007  Job #: [id number]

## 2011-02-06 NOTE — Discharge Summary (Signed)
NAME:  Eric [name], Eric [name]                           ACCOUNT NO.:  [id number]   MEDICAL RECORD NO.:  [id number]                   PATIENT TYPE:  INP   LOCATION:  [id number]                                 FACILITY:  [facility]   PHYSICIAN:  Deboraha Sprang, M.D.               DATE OF BIRTH:  April 11, 1941   DATE OF ADMISSION:  12/11/2003  DATE OF DISCHARGE:  12/14/2003                                 DISCHARGE SUMMARY   DISCHARGE DIAGNOSES:  1. Atrial flutter.     a. Status post failed ablation.     b. Tikosyn therapy initiated this admission.     c. Status post direct current cardioversion December 13, 2003, normal sinus        rhythm.  2. History of atrial fibrillation.  3. Anticoagulation therapy with Coumadin.  4. History of diastolic dysfunction.  5. Nonobstructive coronary artery disease.  6. Normal left ventricular systolic function.  7. Diabetes mellitus type 2.  8. Obstructive sleep apnea.  9. Obesity.   HOSPITAL COURSE:  Please see the office note from Dr. Caryl Comes on November 26, 2003, for complete details.  Briefly, this 70 year old male patient followed  by Dr. Caryl Comes with a history of atrial flutter had recently undergone  attempted flutter ablation.  The attempt failed.  The patient opted for  proceeding with antiarrhythmic therapy.  He was brought into the hospital  electively on December 11, 2003, for initiation of Tikosyn therapy.  This was  started at 500 mcg b.i.d.  Dr. Caryl Comes added Aldactone to the patient's  medical therapy on December 12, 2003, as the patient does have a history of  diastolic dysfunction.  Potassium supplementation was added to his medical  regimen as well secondary to hypokalemia.  Dr. Electa Sniff followed the patient  for his diabetes and his anemia while in the hospital.  The patient's QTC  was prolonged on December 13, 2003, at 520 msec.  His Tikosyn dose was  decreased to 250 mcg b.i.d.  On the morning of March 24 his QTC was 450 msec  and his INR was 2.2.  The patient  underwent DC cardioversion and converted  to normal sterile fashion.  On the morning of December 14, 2003, the patient  was seen by Dr. Verl Blalock.  The patient remained in normal sinus rhythm.  His  potassium was normal at 4.2.  The patient was discharged to home and would  need early follow-up with Dr. Caryl Comes.   LABORATORY DATA:  On December 14, 2003, white count 5400, hemoglobin 10.8,  hematocrit 30.2, platelets 159,000.  At discharge, INR 2.1.  Sodium 137,  potassium 4.2, chloride 102, CO2 28, glucose 143, BUN 27, creatinine 1.4,  calcium 9.1.  Magnesium on admission 2.2.   DISCHARGE MEDICATIONS:  1. Tikosyn 250 mcg b.i.d.  2. Spironolactone 25 mg daily.  3. K-Dur 20 mEq daily.  4. Nexium 40 mg daily.  5.  Actos 45 mg daily.  6. Allopurinol 300 mg daily.  7. Aspirin 81 mg daily.  8. Atacand 32 mg daily.  9. Amaryl 4 mg daily.  10.      Zocor 40 mg q.h.s.  11.      Cartia XT 240 mg b.i.d.  12.      Furosemide 40 mg daily.  13.      Novolin 70/30 insulin.  14.      Coumadin as taken previously.  15.      Niaspan 500 mg q.h.s.  16.      Ambien 10 mg q.h.s.   DIET:  He is to continue his same diabetic diet.   FOLLOW-UP:  The patient will follow up with Dr. Caryl Comes in the next couple of  weeks.  He has been asked to call our office on Monday to arrange that  appointment.      Richardson Dopp, P.A.                        Deboraha Sprang, M.D.    SW/MEDQ  D:  12/14/2003  T:  12/14/2003  Job:  [id number]   cc:   Parke Poisson. Electa Sniff, M.D.  [id number] [place]  [place] [address]  Fax: [phone]

## 2011-02-06 NOTE — H&P (Signed)
[place]  Patient:    Eric [name], Eric [name] Visit Number: [id number] MRN: [id number]          Service Type: MED Location: [phone] Attending Physician:  Eric [name] Dictated by:   Eric [name], M.D. [facility] Admit Date:  01/16/2002 Discharge Date: 01/25/2002                           History and Physical  HISTORY OF PRESENT ILLNESS: Eric [name] is a 70 year old patient of Dr. Lyndel [name] and Dr. Electa [name].  He has a history of atrial fibrillation and he just had a recent TE-guided cardioversion and is on Coumadin.  His EF is in the 45-50% range but he has an elevated EDP with significant history of diastolic dysfunction.  He has been catheterized in the near past and does not have significant coronary disease.  The patient has had recurrent A fib.  He saw Dr. Electa [name] in the office on Monday.  He has been having increasing shortness of breath and clearly has developed a pneumonia or bronchitis with a fever and purulent sputum.  He has not had any blood in his sputum and his INRs have been therapeutic.  He presents to the ER for work-up of his shortness of breath.  PAST MEDICAL HISTORY:  1. Insulin-dependent diabetes.  2. Gout.  SOCIAL HISTORY: He is married.  He quit cigarettes 10 years ago.  He is a retired Radio producer.  MEDICATIONS: As listed in the chart.  1. He is on multiple inhalers including Pulmicort.  2. Lasix 40 mg b.i.d.  3. Accupril 20 mg q.d.  4. Actos 45 mg in the morning.  5. Aspirin q.d.  6. Coumadin 10 mg q.d.  7. Amaryl 4 mg q.d.  8. Nexium 40 mg q.d.  PHYSICAL EXAMINATION:  VITAL SIGNS: Remarkable for low-grade fever of 100.8 degrees.  He is in A fib at a rate of 90.  CHEST: His lungs have significant rhonchi at both bases, left greater than right.  CARDIAC: Heart sounds are distant.  ABDOMEN: Benign.  EXTREMITIES: Intact pulses.  Trace edema.  LABORATORY DATA: His EKG shows atrial  fibrillation with nonspecific ST-T wave changes.  His laboratories and chest x-ray are pending.  IMPRESSION: Eric [name] appears to have flipped back into atrial fibrillation due to pneumonia.  We will start him on Tequin and have Dr. Melvyn [name] see him as he has significant chronic obstructive pulmonary disease with obvious bronchitis pneumonia.  In regard to his atrial fibrillation, we will continue to rate control him with calcium blockers.  We will avoid Sotalol or beta-blockers given the significant chronic obstructive pulmonary disease.  Dr. Melvyn [name] will take care of his pulmonary status as well as bronchitis pneumonia.  Tequin is started in the emergency room.  Will have to watch his pro time while he is being treated for his pneumonia.  In the long run, since he does not have critical coronary disease and low-normal ejection fraction, he may be a candidate for an alternative antiarrhythmic such as flecainide.  Further recommendations to be made by Dr. Lyndel [name] when he sees the patient in the morning. Dictated by:   Eric [name], M.D. [place] Attending Physician:  Eric [name] DD:  01/16/02 TD:  01/17/02 Job: [id number]

## 2011-02-06 NOTE — Op Note (Signed)
NAMEDELONTE, NEDEAU                             ACCOUNT NO.:  [id number]   MEDICAL RECORD NO.:  [id number]                   PATIENT TYPE:  OIB   LOCATION:  2907                                 FACILITY:  [facility]   PHYSICIAN:  Deboraha Sprang, M.D.               DATE OF BIRTH:  [birth year]   DATE OF PROCEDURE:  10/12/2003  DATE OF DISCHARGE:                                 OPERATIVE REPORT   PREOPERATIVE DIAGNOSIS:  Atrial flutter.   POSTOPERATIVE DIAGNOSIS:  Atrial flutter.   PROCEDURE:  Invasive electrophysiological study, electroanatomical mapping,  radiofrequency catheter ablation, and therapy reassessment.   Following the obtaining of informed consent the patient was brought to the  electrophysiology laboratory and placed on the fluoroscopic table in supine  position.  He was submitted to general anesthesia under the care of Dr.  Glennon Mac.  After routine prep and drape, cardiac catheterization was  performed.  A 5 French quadripolar catheter was inserted via the left  femoral vein, which turned out to be a branch of the femoral junction to the  AV junction.  The left femoral vein was again cannulated more medially with  a 10 French sheath, which allowed for passage of the ESI endocardial array  catheter to the right atrium.  A 6 French octapolar catheter was inserted  via the right femoral vein to the coronary sinus.  A 7 French duodecapolar  catheter was inserted via the left femoral vein (AV junction sheath) to  sites in the tricuspid annulus.  A 7 French 8 mm deflectable-tip ablation  catheter was ablated via a Louretta Shorten as well as a flutter sheath to mapping  sites in the posterior septal space.   Surface leads I, aVF, and V1 were monitored continuously throughout the  procedure.  Following the insertion of the catheters a stimulation protocol  included incremental atrial pacing, incremental ventricular pacing, single  atrial extrastimuli at a pace cycle length of 600 msec,  entrainment mapping  from the coronary sinus and from the tricuspid annulus.   RESULTS:   SURFACE ELECTROCARDIOGRAM:  Rhythm:  Atrial flutter (initial), sinus rhythm  (final).  Cycle length:  495 msec (initial), 765 msec (final).  PR interval:  N/A (initial), 184 msec (final).  QRS duration:  84 msec (initial), 88 msec (final).  QT interval:  384 msec (initial), 385 msec (final).  P-wave duration:  N/A (initial), 100 msec (final).  Bundle branch block:  Absent (initial), absent (final).  Pre-excitation:  Absent (initial), absent (final).   There was transient right bundle branch block with catheter manipulation,  which resolved spontaneously.   The AH interval was measured but, unfortunately, not recorded on the sheets  and the computer is now off, but the HV interval was 59 msec at the end of  the procedure.   AV Wenckebach was 500 msec, VA conduction was dissociated at 600 msec.   The  AV nodal effective refractory period at a pace cycle length of 600 msec  was 380 msec, and conduction was continuous.   There was no evidence of accessory pathway.   ARRHYTHMIAS INDUCED:  The patient presented to the lab in atrial flutter.  Electroanatomical mapping as well as activation mapping demonstrated isthmus  dependence, although there was very low-amplitude signal and I wonder  whether we did not miss some of the right atrium with our map.  The atrial  cycle length was 250 msec, and it could be entrained from the tricuspid  annulus but not from the coronary sinus.  At one application of RF energy,  the patient developed atrial fibrillation and was subsequently cardioverted  x2 with 200 joules and 360 joules to restore sinus rhythm.   RADIOFREQUENCY ENERGY APPLICATIONS:  A total of 38 minutes 19 seconds of  radio wave energy was applied in multiple applications between the tricuspid  annulus and the inferior vena cava.  Lines were drawn at 4 o'clock, 5  o'clock, 6 o'clock, and 7  o'clock, all of which failed to terminate atrial  flutter.  Double potentials could be seen in the proximal (IVC) portion of  the isthmus but not in the more distal portion.  Despite multiple  applications of energy and identification by the [facility] system as to location,  significant atrial amplitude could still be seen.  There was a clear  eustachian ridge, and I interpreted this as there being a thick eustachian  ridge through which we could not successfully create a transmural lesion.  This persisted across the scope of time as outlined above.   FLUOROSCOPY TIME:  A time of 55 minutes of fluoroscopy time was utilized at  7-1/2 frames per second.   IMPRESSION:  1. Normal sinus function.  2. Sustained atrial flutter, which was cardioverted but isthmus block could     not be obtained.  3. Normal AV nodal function.  4. Normal His-Purkinje system function.  5. No accessory pathway.  6. Normal ventricular response to programmed stimulation as above.   SUMMARY AND CONCLUSION:  The results of electrophysiological testing  confirmed isthmus-dependent flutter as the mechanism underlying Mr. Aguilar's  arrhythmia.  Cardioversion was successful; however, interruption of  cavotricuspid isthmus conduction could not be accomplished.  This was  secondary to a thick eustachian ridge which prevented [place] from successfully  creating transmural lesions.   We will plan to follow him up over time and see how it is that he tolerates  this and the frequency of his episodes.  As he has atrial fibrillation and  amiodarone for same, there is no anticipated discontinuation of his  Coumadin, which might prompt a more aggressive interventional approach.                                               Deboraha Sprang, M.D.    SCK/MEDQ  D:  10/12/2003  T:  10/13/2003  Job:  [id number]   cc:   Parke Poisson. Electa Sniff, M.D.  [id number], [place] [address]  Fax: [phone]  Electrophysiology  Laboratory

## 2011-02-06 NOTE — H&P (Signed)
[place]  Patient:    Eric [name], Eric [name] Visit Number: [id number] MRN: [id number]          Service Type: MED Location: [phone] Attending Physician:  Christy Sartorius Dictated by:   Christy Sartorius, M.D. [facility] Admit Date:  12/19/2001   CC:         Parke Poisson. Electa Sniff, M.D.   History and Physical  CHIEF COMPLAINT:  Shortness of breath, fatigue and lower extremity edema.  HISTORY:  The patient is a 70 year old white male with no prior cardiac history, who presents with four- to six-week history of increasing fatigue, weakness, nondescript chest discomfort and lower extremity edema.  The patient notes a flu-like symptom approximately four to six weeks ago that lasted two weeks; this was associated with head congestion, headaches, upper respiratory congestion, postnasal drip and nonproductive cough; he also felt some shortness of breath.  This resolved after two weeks, however, he continued to have dyspnea on exertion, paroxysmal nocturnal dyspnea and fatigue.  Over the ensuing weeks, he had progressive weight gain, lower extremity edema and poor exercise tolerance.  He had chest pressure at night but no exertional chest pain.  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  He notes just going out for approximately 3 to 4 seconds and he feels he has had poor memory and recall since.  He denies any palpitations.  No exertional chest pain.  No shortness of breath at rest.  He does not significant diuresis, particularly at night, more with his dyspnea.  He feels that he may have had a fever approximately four to six weeks ago when he had his initial flu-like symptoms; he has not had any recently.  He denies any nausea or vomiting.  Review of systems is otherwise noncontributory.  PAST MEDICAL HISTORY:  Notable for diabetes since 1994, gout.  ALLERGIES:   None.  MEDICATIONS: 1. Accupril 20 mg a day. 2. Actos 45 mg q.d. 3. Albuterol MDI as needed. 4. Rhinocort nasal spray. 5. Glucophage 5 mg b.i.d. 6. Allopurinol 300 mg q.d. 7. Amaryl 4 mg q.d.  SOCIAL HISTORY:  The patient lives in [place].  He is married.  He is a retired Engineer, agricultural; he does work part-time as a Surveyor, mining.  He has a history of tobacco use, quit in the 1990s.  He uses alcohol socially.  He denies drug use.  FAMILY HISTORY:  Family history is notable for congestive heart failure in his mother.  PHYSICAL EXAMINATION:  GENERAL:  He is a well-developed, well-nourished white male in no acute distress.  VITAL SIGNS:  Temperature 96.9; pulse of 120, in atrial fibrillation; blood pressure is 142/96; respirations 18.  HEENT:  Pupils are equal, round and reactive to light.  Extraocular muscles are intact.  Oropharynx:  No lesion.  Cranial nerves II-XII are intact.  NECK:  Supple.  He does have mild jugular venous distention.  HEART:  Exam reveals a regular rate, tachycardic.  No murmurs.  LUNGS:  Relatively clear to auscultation.  ABDOMEN:  Soft and nontender.  EXTREMITIES:  There is 2+ pitting edema to the knees.  Peripheral pulses are 2+ and equal bilaterally.  Motor exam is 5/5.  Sensory is intact to touch. Skin shows no rashes.  LABORATORY AND ACCESSORY DATA:  Chest x-ray shows mild cardiac enlargement with mild congestive heart failure.  ECG shows atrial fibrillation of 116 with no  acute ischemic changes.  Laboratory data shows a white count of 4.8, hemoglobin 11.5, hematocrit 32.6, platelets of 164,000.  Sodium is 141, potassium 4.0, chloride 106, bicarb 28, BUN is 14, creatinine 1.0, glucose 185.  BNP is 145.  LFTs within normal limits.  Albumin is 4.0.  CK is 4.1.  PTT is 35, PT is 16.4, INR is 1.5.  ASSESSMENT AND PLAN:  The patient is a 70 year old gentleman who presents with new-onset atrial fibrillation.  It is unclear whether he had  initial respiratory infection as the inciting event or whether his initial symptoms were due to fibrillation.  The patient will be admitted to the hospital for rate control with calcium and beta blockers as tolerated.  We will continue oxygen if needed.  We will diurese patient with Lasix and follow his potassium closely.  We will check an echocardiogram for left ventricular and valvular function.  We will also anticoagulate patient with Lovenox.  We will check patients hemoglobin A1c, given his history of diabetes, and follow his blood sugars while hospitalized.  We will also guaiac his stools to rule out any significant gastrointestinal blood loss.  Further assessment of patients coronary arteries with a cardiac catheterization will be indicated to rule out coronary ischemia as an inciting event.  We will also check his CPKs to ensure that he has not had a recent myocardial event.  We will then consider early versus late transesophageal echocardiography and direct-current cardioversion. The patient is agreeable to early transesophageal echocardiography and as he has been symptomatic with fluid retention and poor exercise tolerance, I believe that it would be beneficial to return him to normal sinus rhythm as soon as possible, provided he has no intracardiac thrombus.  We will also obtain thyroid function tests.  The risks, benefits and alternatives of cardiac catheterization were discussed with patient and he understands and agrees to proceed, as well as the risks of atrial fibrillation, direct-current cardioversion and anticoagulation. Dictated by:   Christy Sartorius, M.D. [place] Attending Physician:  Christy Sartorius DD:  12/19/01 TD:  12/19/01 Job: [id number]

## 2011-02-06 NOTE — Discharge Summary (Signed)
Eric [name], Eric [name]                 ACCOUNT NO.:  [id number]   MEDICAL RECORD NO.:  [id number]          PATIENT TYPE:  INP   LOCATION:  [id number]                         FACILITY:  [facility]   PHYSICIAN:  Deboraha Sprang, MD, FACCDATE OF BIRTH:  09-18-1941   DATE OF ADMISSION:  11/23/2006  DATE OF DISCHARGE:  11/24/2006                               DISCHARGE SUMMARY   THIS PATIENT HAS ALLERGY TO CONTRAST MEDIA WHICH CAUSE RENAL FAILURE.   Dictation ended at this point.      Sueanne Margarita, [place]      Deboraha Sprang, MD, [facility]  Electronically Signed    GM/MEDQ  D:  11/24/2006  T:  11/24/2006  Job:  [id number]   cc:   Parke Poisson. Electa Sniff, M.D.
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
## 2011-02-06 NOTE — Op Note (Signed)
NAME:  Eric [name], Eric [name]                           ACCOUNT NO.:  [id number]   MEDICAL RECORD NO.:  [id number]                   PATIENT TYPE:  INP   LOCATION:  [id number]                                 FACILITY:  [facility]   PHYSICIAN:  Deboraha Sprang, M.D.               DATE OF BIRTH:  07-Jul-1941   DATE OF PROCEDURE:  12/13/2003  DATE OF DISCHARGE:                                 OPERATIVE REPORT   PREOPERATIVE DIAGNOSIS:  Atrial flutter.   POSTOPERATIVE DIAGNOSIS:  Sinus rhythm.   PROCEDURE:  DC cardioversion.   The patient submitted to anesthesia under the care of Dr. Sherren Kerns.  The  patient received a total of 350 mg of sodium Pentothal.   Initially, 125 joule shock was delivered synchronously into atrial flutter,  failing to terminate atrial flutter.   The anterior patch was moved a little bit more medially and a 200 joule  shock was then delivered synchronously with biphasic wave forms, terminating  atrial flutter and restoring sinus rhythm.   The patient tolerated the procedure with modest scream, otherwise without  complication.                                               Deboraha Sprang, M.D.    SCK/MEDQ  D:  12/13/2003  T:  12/14/2003  Job:  [id number]

## 2011-02-06 NOTE — Discharge Summary (Signed)
[place]. [facility]  Patient:    Eric [name], Eric [name] Visit Number: [id number] MRN: [id number]          Service Type: MED Location: [phone] Attending Physician:  Bosie Clos Dictated by:   Darnelle Going, N.P. Admit Date:  01/17/2072 Discharge Date: 01/26/2072   CC:         Parke Poisson. Electa Sniff, M.D.   Discharge Summary  DATE OF BIRTH:  07/06/1941.  REASON FOR ADMISSION:  Dyspnea and fatigue in the setting of history of atrial fibrillation.  DISCHARGE DIAGNOSES: 1. Atrial fibrillation, status post transesophageal-guided cardioversion this    admission with failure to revert to normal sinus rhythm. 2. Pneumonia treated this admission with antibiotic therapy with good    resolution. 3. Coumadin therapy for atrial fibrillation, therapeutic INRs. 4. Insulin-dependent diabetes mellitus. 5. Gout. 6. Gastroesophageal reflux disease. 7. Chronic obstructive pulmonary disease.  HISTORY OF PRESENT ILLNESS:  This delightful 70 year old gentleman with medical history outlined above is a patient of Dr. Christy Sartorius and Dr. Parke Poisson. [place].  He has had recurrent atrial fibrillation and the most recent onset precipitating this admission was combined with increasing shortness of breath and development of pneumonia with a fever and purulent sputum.  Given his history, it was elected to be prudent to admit him.  HOSPITAL COURSE:  The patient was admitted and his rate was controlled with calcium channel blockers.  Dr. Christena Deem. Wert was called in to see him for treatment of his significant COPD and treated his bronchitis with antibiotic and inhaled steroid therapy.  Amiodarone was started for the patients rate as well as gentle diuresis.  The patients coagulation labs were followed very closely due to the variety of medications that he was on.  The patient experienced some transient renal insufficiency after the initial diuresis was completed with BUN of  46 and creatinine of 3.2 on day three of this admission.  His Glucophage and Reglan were held and his IV antibiotic therapy was reduced.  Both his Cozaar and allopurinol were held.  His renal insufficiency gradually improved over the course of his hospitalization.  The patient also experienced some transient anemia for which iron studies and stool guaiac were performed.  He was treated for this with ferrous sulfate with modest improvement.  In order to more fully evaluate his renal insufficiency a MRA of the abdomen was performed to image the renal arteries and there was no evidence found of renal artery stenosis or significant vascular disease.  An echocardiogram was performed and it was within normal limits.  An ejection fraction being 50-55%.  The patient had a variety of laboratory abnormalities throughout his hospitalization including elevated CBGs and transient hypokalemia and hyponatremia.  These were all treated, adjusted, and improved by the day of discharge.  On Jan 25, 2072, an direct current cardioversion was attempted by Dr. Collier Salina C. Nishan.  The patient converted twice for a short period of time and then returned to atrial fibrillation.  It was decided at this point to reattempt cardioversion in four weeks using a biphasic defibrillator.  This plan was explained in detail to the patients wife.  PHYSICAL EXAMINATION:  GENERAL:  On the day of discharge, the patient offered no complaints of chest pain or shortness of breath.  He did remain in atrial fibrillation.  VITAL SIGNS:  Blood pressure 110/80, pulse 72, respirations 20, temperature afebrile.  Oxygen saturation 97% on room air.  GENERAL:  NAD.  NECK:  Without JVD or bruit.  CHEST:  Some inspiratory and expiratory wheezes heard bilaterally at the bases.  No rhonchi or rales.  HEART:  Irregularly irregular.  Clear S1 and S2.  No obvious murmur.  ABDOMEN:  Soft and nontender.  Bowel sounds  present.  EXTREMITIES:  There is 1/2+ pitting edema bilaterally with some dependent rubor observed.  LABORATORY DATA:  A 12-lead EKG shows atrial fibrillation with a competing junctional pacemaker rate of 78.  Chest x-ray revealed cardiomegaly and bilateral pulmonary infiltrates.  A CT of the sinus revealed acute sinusitis involving the ethmoids bilaterally with mucosal thickening.  The MRA of the abdomen is as above.  No evidence of renal artery stenosis.  INR at discharge is 2.0.  PT is 21.0.  BMET at discharge shows sodium 139, potassium 4.4, chloride 108, carbon dioxide 26, BUN 28, creatinine 1.4, glucose 152.  Hemogram at discharge shows WBC 7.0, hemoglobin 10.2, hematocrit 28.6, platelets 148,000.  Beta natriuretic peptide is 101.  CK-MBs were negative x 12.  Hemoglobin A1C is 5.8.  Liver laboratory panel within normal limits.  Total bilirubin elevated at 2.0.  TSH is within normal limits.  DISPOSITION:  The patient is discharged to home in the care of his wife on the following medications.  DISCHARGE MEDICATIONS:  1. Actos 45 mg 1 q.d.  2. Metformin as before.  3. Allopurinol 300 mg 1 q.d.  4. Aspirin 325 mg 1 q.d.  5. Humibid DM 2 tablets b.i.d. with plenty of water.  6. Ferrous sulfate 325 mg 1 t.i.d.  7. Insulin 70/30 as before.  8. Pepcid 20 mg b.i.d.  9. Afrin nasal spray 1 spray prior to Nizoral nasal spray 2 sprays each     nostril q.d. 10. Advair 1 puff b.i.d. 11. Cardizem CD 180 mg 1 b.i.d. 12. Lasix 40 mg 1 q.d. 13. Amiodarone 400 mg 1 b.i.d. 14. Ceftin 500 mg 1 b.i.d. 15. Levsin times five days. 16. Coumadin 7.5 mg q.d.  ACTIVITY:  Gentle exercise for 30 minutes a day.  DIET:  Recommended low salt, low cholesterol.  SPECIAL INSTRUCTIONS:  The patient has had trouble contacting the office before.  So, he was given both numbers and instructed to contact us if he has chest pain, shortness of breath, or any  signs of worsening of pulmonary  status.  He  will follow up with the P.A. on Feb 17, 2072 at [place] and he will go to the [facility] on Friday, Jan 28, 2072.  His INR sheet is faxed to [facility] and a message is left for her to call him with a precise time.  He was call the interim with any problems, questions, concerns, change or increasing symptoms. Dictated by:   Darnelle Going, N.P. Attending Physician:  Bosie Clos DD:  01/25/02 TD:  01/25/02 Job: [id number]

## 2011-02-06 NOTE — Discharge Summary (Signed)
Eric [name], Eric [name]                 ACCOUNT NO.:  [id number]   MEDICAL RECORD NO.:  [id number]          PATIENT TYPE:  INP   LOCATION:  [id number]                         FACILITY:  [facility]   PHYSICIAN:  Sueanne Margarita, PA   DATE OF BIRTH:  11/26/1940   DATE OF ADMISSION:  11/22/2006  DATE OF DISCHARGE:  11/24/2006                               DISCHARGE SUMMARY   ADDENDUM TO DISCHARGE SUMMARY:   HISTORY OF PRESENT ILLNESS:  The patient was admitted with chest pain  having been ruled out for myocardial infarction.  A 2-D echocardiogram  was performed showing ejection fraction 60-65%.  Troponin I studies were  negative x3.  The patient will return to the office for outpatient  stress test on March 11 at 12:30.  The reason for this dictation is that  the patient was placed on lisinopril 5 mg daily when he presented to the  emergency room.  This was not one of his home medications, but the  patient does have diastolic dysfunction and I had discussed with the  patient that this might be an apt medication for this patient to take,  but that I would reserve regimen on this until the patient saw Dr. Caryl Comes  March 31, and that I would dictate a note to that effect which I am  doing right now.  The patient may benefit from ACE I inhibitors.      Sueanne Margarita, PA     GM/MEDQ  D:  11/24/2006  T:  11/24/2006  Job:  [id number]   cc:   Deboraha Sprang, MD, [facility]
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
## 2011-02-06 NOTE — Discharge Summary (Signed)
[place]. [facility]  Patient:    Eric [name], Eric [name] Visit Number: [id number] MRN: [id number]          Service Type: MED Location: [phone] Attending Physician:  Christy Sartorius Dictated by:   Darnelle Going, N.P. Admit Date:  12/19/2001 Disc. Date: 12/21/01   CC:         Parke Poisson. Electa Sniff, M.D.   Discharge Summary  DATE OF BIRTH:  Aug 13, 1941  REASON FOR ADMISSION:  Dyspnea, fatigue and lower extremity edema.  HISTORY OF PRESENT ILLNESS:  This delightful, 70 year old gentleman with no prior cardiac history presented with four to six weeks of increasing dyspnea and fatigue with chest discomfort accompanied by some progressive lower extremity edema.  He also had at the time some associated head congestion, headaches and upper respiratory symptoms and thought the dyspnea was related to seasonal allergies.  However, his symptoms persisted past resolution of an upper respiratory infection.  There was some chest pressure at night, but no exertional chest pain.  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  He presented to our service for further evaluation and treatment.  HOSPITAL COURSE:  On arrival, the patient was found to be in atrial fibrillation with RVR.  He was given an IV Cardizem bolus and drip.  Some gentle diuresis was begun and a 2D echocardiogram was scheduled.  He was placed on Lovenox per pharmacy protocol.  His cardiac enzymes were normal.  He was taken to the catheterization lab for angiography to rule out ischemia, given his multiple cardiac risk factors.  He was found to have an increased left ventricular end-diastolic pressure and moderate coronary artery disease deemed insignificantly hemodynamically.  He recovered from his catheterization uneventfully.  Dr. Electa Sniff consulted on the patient prior to a planned TEE cardioversion to investigate the  patients history of intermittent solid food dysphagia.  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  The patient was taken to TEE by Dr. Stanford Breed on April 1.  Findings were mild global reduction and left ventricular function, mild left atrial enlargement with no left atrial thrombus seen.  There was a small pericardial effusion. The patient was subsequently cardioverted with restoration of normal sinus rhythm.  The patient tolerated all procedures well.  PHYSICAL EXAMINATION:  GENERAL:  On the day of discharge, the patient reported no complaints of chest pain or shortness of breath.  He felt quite a bit better, but still had some lower extremity edema.  VITAL SIGNS:  Blood pressure 130/80, pulse 75, respiratory rate 20, saturations 95% on room air, temperature 98.1.  HEART:  Regular rate and rhythm without murmurs, rubs or gallops.  LUNGS:  Clear to auscultation bilaterally.  ABDOMEN:  Nontender.  EXTREMITIES:  1+ bilateral pitting edema.  LABORATORY DATA AND X-RAY FINDINGS:  Chemistries with sodium 140, potassium 3.6, chloride 104, CO2 25, BUN 19, creatinine 1.1, glucose 87.  PT at discharge was 16.4, INR 1.5.  Troponins were negative x3.  Lipid profile with total cholesterol 137, triglycerides high at 201, HDL low at 30, LDL 67. Vitamin B12 318.  FT4 1.15, T3 108.5.  Total bilirubin high at 2.4 on liver panel, direct bilirubin high at 0.4 and indirect bilirubin high at 2.0.  AST, ALT and Alk phos within normal limits.  EKG obtained on  admission showed atrial fibrillation with a rapid ventricular response, rate of 116 with intraventricular conduction delay.  This is new from previous studies.  Transesophageal echocardiogram results as reported above.  DISPOSITION:  The patient is discharged to home in the care of his wife.  DISCHARGE MEDICATIONS:  1. Coumadin  10 mg q.d.  2. Aspirin 325 mg one q.d.  3. Amaryl 4 mg one q.d.  4. Accupril 20 mg one q.d.  5. Actos 45 mg one q.d.  6. Allopurinol 300 mg one q.d.  7. Pulmicort inhaler two puffs b.i.d.  8. Insulin as before.  9. Lasix 40 mg b.i.d. 10. Lovenox 140 mg subcu b.i.d. x1 week.  The patients prescription plan does     cover this in 150 mg syringes and he agrees to waste 10 mg with each     syringe.  ACTIVITY:  No driving, heavy lifting, strenuous activity or tub baths x2 days.  DIET:  Low fat, low salt cholesterol.  SPECIAL INSTRUCTIONS:  The patient agrees to call the office is groin wound becomes hard or painful.  FOLLOWUP:  He will followup Thursday at the Coumadin clinic on April 4, at 11:30 a.m. and see Stanton Kidney.  Records were faxed to her today.  Follow up with Dr. Lyndel Safe on May 1, at 10:45 a.m.  He will come in early at 8:30 a.m. for an echocardiogram, EKG and BMP blood work.  He knows to call in the interim with any problems, questions or concerns, change or increase in symptoms. Dictated by:   Darnelle Going, N.P. Attending Physician:  Christy Sartorius DD:  12/21/01 TD:  12/21/01 Job: [id number]

## 2011-02-06 NOTE — Cardiovascular Report (Signed)
[place]. [facility]  Patient:    Eric [name], Eric [name] Visit Number: [id number] MRN: [id number]          Service Type: MED Location: [phone] Attending Physician:  Christy Sartorius Dictated by:   Allene Dillon, M.D. [facility] Proc. Date: 12/19/01 Admit Date:  12/19/2001   CC:         Parke Poisson. Electa Sniff, M.D.  Christy Sartorius, M.D. [facility]  Cardiac Catheterization Lab   Cardiac Catheterization  PROCEDURES PERFORMED:  Left heart catheterization with coronary angiography.  CARDIOLOGIST:  Allene Dillon, M.D.  INDICATIONS:  [name] is a 70 year old male who presented with atrial fibrillation, congestive heart failure and chest pain.  He was referred by Dr. Lyndel Safe for cardiac catheterization.  DESCRIPTION OF PROCEDURE:  A 6 French sheath was placed in the right femoral artery.  artery.  Standard Judkins 6 French catheters were utilized.  Left ventricular end-diastolic pressure was measured with an angled pigtail catheter.  Left ventriculography was not performed due to significantly elevated left ventricular end-diastolic pressure.   Contrast was Omnipaque. There were no complications.  RESULTS:  HEMODYNAMICS:  Left ventricular pressure 138/38, aortic pressure 138/90.  LEFT VENTRICULOGRAM:  The left ventriculography was not performed as described above.  CORONARY ARTERIOGRAPHY:  (Right dominant).  Left main:  Normal.  Left anterior descending:  The LAD has a 30% stenosis in the proximal vessel and a tubular 60% stenosis in the mid to distal vessel.  The LAD gives rise to a small first diagonal and a normal size second diagonal.  The second diagonal has a 20% stenosis.  Left circumflex:  The left circumflex coronary gives rise to a small first obtuse marginal, small second obtuse marginal and a large third obtuse marginal.  The OM-III has a 20% stenosis proximally.  Right coronary artery:  The right coronary artery is a dominant vessel.  There is a diffuse 30%  stenosis in the mid vessel.  The distal vessel has a tubular 30% stenosis.  The distal right coronary artery gives rise to a small posterior descending artery and two normal size posterior lateral branches.  IMPRESSION: 1. Significantly elevated left ventricular end-diastolic pressure. 2. Moderate coronary artery disease involving the left anterior descending    artery as described.  This does not appear to be hemodynamically    significant.  However, as there is a concern for possible ischemia I    would consider a stress Cardiolite scan.  PLAN:  The patient will be managed medically at this.  A 2-D echocardiogram will be obtained to assess the patients left ventricular systolic function. Dictated by:   Allene Dillon, M.D. [place] Attending Physician:  Christy Sartorius DD:  12/19/01 TD:  12/19/01 Job: [id number]
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
## 2011-09-11 ENCOUNTER — Other Ambulatory Visit: Payer: Self-pay | Admitting: Family Medicine

## 2011-09-11 ENCOUNTER — Ambulatory Visit
Admission: RE | Admit: 2011-09-11 | Discharge: 2011-09-11 | Disposition: A | Discharge status: Home / Self Care | Payer: Medicare Other | Source: Ambulatory Visit | Attending: Family Medicine | Admitting: Family Medicine

## 2011-09-11 DIAGNOSIS — R911 Solitary pulmonary nodule: Secondary | ICD-10-CM

## 2013-02-25 DIAGNOSIS — N1832 Chronic kidney disease, stage 3b: Secondary | ICD-10-CM

## 2013-02-25 DIAGNOSIS — M1610 Unilateral primary osteoarthritis, unspecified hip: Secondary | ICD-10-CM

## 2013-02-25 DIAGNOSIS — M461 Sacroiliitis, not elsewhere classified: Secondary | ICD-10-CM

## 2013-02-25 DIAGNOSIS — E1122 Type 2 diabetes mellitus with diabetic chronic kidney disease: Secondary | ICD-10-CM

## 2013-02-25 DIAGNOSIS — Z794 Long term (current) use of insulin: Secondary | ICD-10-CM | POA: Insufficient documentation

## 2013-02-25 HISTORY — DX: Type 2 diabetes mellitus with diabetic chronic kidney disease: E11.22

## 2013-02-25 HISTORY — DX: Unilateral primary osteoarthritis, unspecified hip: M16.10

## 2013-02-25 HISTORY — DX: Sacroiliitis, not elsewhere classified: M46.1

## 2013-02-25 HISTORY — DX: Chronic kidney disease, stage 3b: N18.32

## 2014-03-21 DIAGNOSIS — L03119 Cellulitis of unspecified part of limb: Secondary | ICD-10-CM

## 2014-03-21 DIAGNOSIS — L989 Disorder of the skin and subcutaneous tissue, unspecified: Secondary | ICD-10-CM | POA: Insufficient documentation

## 2014-03-21 HISTORY — DX: Cellulitis of unspecified part of limb: L03.119

## 2014-03-21 HISTORY — DX: Disorder of the skin and subcutaneous tissue, unspecified: L98.9

## 2014-04-24 DIAGNOSIS — M5136 Other intervertebral disc degeneration, lumbar region: Secondary | ICD-10-CM

## 2014-04-24 DIAGNOSIS — E039 Hypothyroidism, unspecified: Secondary | ICD-10-CM

## 2014-04-24 DIAGNOSIS — J3489 Other specified disorders of nose and nasal sinuses: Secondary | ICD-10-CM

## 2014-04-24 DIAGNOSIS — M25559 Pain in unspecified hip: Secondary | ICD-10-CM | POA: Insufficient documentation

## 2014-04-24 DIAGNOSIS — M169 Osteoarthritis of hip, unspecified: Secondary | ICD-10-CM

## 2014-04-24 DIAGNOSIS — E1142 Type 2 diabetes mellitus with diabetic polyneuropathy: Secondary | ICD-10-CM | POA: Insufficient documentation

## 2014-04-24 DIAGNOSIS — E1143 Type 2 diabetes mellitus with diabetic autonomic (poly)neuropathy: Secondary | ICD-10-CM | POA: Insufficient documentation

## 2014-04-24 DIAGNOSIS — K8020 Calculus of gallbladder without cholecystitis without obstruction: Secondary | ICD-10-CM | POA: Insufficient documentation

## 2014-04-24 DIAGNOSIS — R945 Abnormal results of liver function studies: Secondary | ICD-10-CM

## 2014-04-24 DIAGNOSIS — IMO0002 Reserved for concepts with insufficient information to code with codable children: Secondary | ICD-10-CM

## 2014-04-24 DIAGNOSIS — E785 Hyperlipidemia, unspecified: Secondary | ICD-10-CM

## 2014-04-24 DIAGNOSIS — I2510 Atherosclerotic heart disease of native coronary artery without angina pectoris: Secondary | ICD-10-CM

## 2014-04-24 DIAGNOSIS — N289 Disorder of kidney and ureter, unspecified: Secondary | ICD-10-CM

## 2014-04-24 DIAGNOSIS — M109 Gout, unspecified: Secondary | ICD-10-CM | POA: Insufficient documentation

## 2014-04-24 DIAGNOSIS — N138 Other obstructive and reflux uropathy: Secondary | ICD-10-CM | POA: Insufficient documentation

## 2014-04-24 DIAGNOSIS — I509 Heart failure, unspecified: Secondary | ICD-10-CM

## 2014-04-24 DIAGNOSIS — E1149 Type 2 diabetes mellitus with other diabetic neurological complication: Secondary | ICD-10-CM | POA: Insufficient documentation

## 2014-04-24 DIAGNOSIS — N401 Enlarged prostate with lower urinary tract symptoms: Secondary | ICD-10-CM

## 2014-04-24 DIAGNOSIS — M51369 Other intervertebral disc degeneration, lumbar region without mention of lumbar back pain or lower extremity pain: Secondary | ICD-10-CM

## 2014-04-24 DIAGNOSIS — M5126 Other intervertebral disc displacement, lumbar region: Secondary | ICD-10-CM

## 2014-04-24 DIAGNOSIS — H9190 Unspecified hearing loss, unspecified ear: Secondary | ICD-10-CM | POA: Insufficient documentation

## 2014-04-24 HISTORY — DX: Abnormal results of liver function studies: R94.5

## 2014-04-24 HISTORY — DX: Type 2 diabetes mellitus with diabetic polyneuropathy: E11.42

## 2014-04-24 HISTORY — DX: Atherosclerotic heart disease of native coronary artery without angina pectoris: I25.10

## 2014-04-24 HISTORY — DX: Type 2 diabetes mellitus with other diabetic neurological complication: E11.49

## 2014-04-24 HISTORY — DX: Unspecified hearing loss, unspecified ear: H91.90

## 2014-04-24 HISTORY — DX: Calculus of gallbladder without cholecystitis without obstruction: K80.20

## 2014-04-24 HISTORY — DX: Gout, unspecified: M10.9

## 2014-04-24 HISTORY — DX: Other specified disorders of nose and nasal sinuses: J34.89

## 2014-04-24 HISTORY — DX: Heart failure, unspecified: I50.9

## 2014-04-24 HISTORY — DX: Hyperlipidemia, unspecified: E78.5

## 2014-04-24 HISTORY — DX: Hypothyroidism, unspecified: E03.9

## 2014-04-24 HISTORY — DX: Reserved for concepts with insufficient information to code with codable children: IMO0002

## 2014-04-24 HISTORY — DX: Pain in unspecified hip: M25.559

## 2014-04-24 HISTORY — DX: Osteoarthritis of hip, unspecified: M16.9

## 2014-04-24 HISTORY — DX: Other obstructive and reflux uropathy: N40.1

## 2014-04-24 HISTORY — DX: Other obstructive and reflux uropathy: N13.8

## 2014-04-24 HISTORY — DX: Disorder of kidney and ureter, unspecified: N28.9

## 2014-04-24 HISTORY — DX: Other intervertebral disc degeneration, lumbar region: M51.36

## 2014-04-24 HISTORY — DX: Other intervertebral disc degeneration, lumbar region without mention of lumbar back pain or lower extremity pain: M51.369

## 2014-04-24 HISTORY — DX: Type 2 diabetes mellitus with diabetic autonomic (poly)neuropathy: E11.43

## 2014-04-24 HISTORY — DX: Other intervertebral disc displacement, lumbar region: M51.26

## 2014-06-06 DIAGNOSIS — M170 Bilateral primary osteoarthritis of knee: Secondary | ICD-10-CM | POA: Insufficient documentation

## 2014-06-06 DIAGNOSIS — M25559 Pain in unspecified hip: Secondary | ICD-10-CM

## 2014-06-06 HISTORY — DX: Pain in unspecified hip: M25.559

## 2014-06-06 HISTORY — DX: Bilateral primary osteoarthritis of knee: M17.0

## 2014-07-18 DIAGNOSIS — M778 Other enthesopathies, not elsewhere classified: Secondary | ICD-10-CM | POA: Insufficient documentation

## 2014-07-18 HISTORY — DX: Other enthesopathies, not elsewhere classified: M77.8

## 2014-09-19 DIAGNOSIS — D519 Vitamin B12 deficiency anemia, unspecified: Secondary | ICD-10-CM

## 2014-09-19 HISTORY — DX: Vitamin B12 deficiency anemia, unspecified: D51.9

## 2014-09-26 DIAGNOSIS — E538 Deficiency of other specified B group vitamins: Secondary | ICD-10-CM

## 2014-09-26 HISTORY — DX: Deficiency of other specified B group vitamins: E53.8

## 2014-12-19 DIAGNOSIS — F5101 Primary insomnia: Secondary | ICD-10-CM | POA: Insufficient documentation

## 2014-12-19 HISTORY — DX: Primary insomnia: F51.01

## 2017-11-01 DIAGNOSIS — J101 Influenza due to other identified influenza virus with other respiratory manifestations: Secondary | ICD-10-CM | POA: Insufficient documentation

## 2017-11-01 HISTORY — DX: Influenza due to other identified influenza virus with other respiratory manifestations: J10.1

## 2018-04-06 DIAGNOSIS — R600 Localized edema: Secondary | ICD-10-CM | POA: Insufficient documentation

## 2018-04-06 HISTORY — DX: Localized edema: R60.0

## 2018-05-26 DIAGNOSIS — R195 Other fecal abnormalities: Secondary | ICD-10-CM

## 2018-05-26 HISTORY — DX: Other fecal abnormalities: R19.5

## 2018-06-01 DIAGNOSIS — Z860100 Personal history of colon polyps, unspecified: Secondary | ICD-10-CM

## 2018-06-01 HISTORY — DX: Personal history of colonic polyps: Z86.010

## 2018-06-01 HISTORY — DX: Personal history of colon polyps, unspecified: Z86.0100

## 2018-06-28 DIAGNOSIS — I129 Hypertensive chronic kidney disease with stage 1 through stage 4 chronic kidney disease, or unspecified chronic kidney disease: Secondary | ICD-10-CM | POA: Insufficient documentation

## 2018-06-28 DIAGNOSIS — N1832 Chronic kidney disease, stage 3b: Secondary | ICD-10-CM

## 2018-06-28 DIAGNOSIS — R809 Proteinuria, unspecified: Secondary | ICD-10-CM | POA: Insufficient documentation

## 2018-06-28 DIAGNOSIS — N184 Chronic kidney disease, stage 4 (severe): Secondary | ICD-10-CM | POA: Insufficient documentation

## 2018-06-28 HISTORY — DX: Chronic kidney disease, stage 3b: N18.32

## 2018-06-28 HISTORY — DX: Proteinuria, unspecified: R80.9

## 2018-06-28 HISTORY — DX: Hypertensive chronic kidney disease with stage 1 through stage 4 chronic kidney disease, or unspecified chronic kidney disease: I12.9

## 2018-07-08 DIAGNOSIS — K635 Polyp of colon: Secondary | ICD-10-CM | POA: Insufficient documentation

## 2018-07-08 DIAGNOSIS — K648 Other hemorrhoids: Secondary | ICD-10-CM | POA: Insufficient documentation

## 2018-07-08 HISTORY — DX: Other hemorrhoids: K64.8

## 2018-07-08 HISTORY — DX: Polyp of colon: K63.5

## 2018-07-09 DIAGNOSIS — R748 Abnormal levels of other serum enzymes: Secondary | ICD-10-CM

## 2018-07-09 HISTORY — DX: Abnormal levels of other serum enzymes: R74.8

## 2018-07-13 DIAGNOSIS — E875 Hyperkalemia: Secondary | ICD-10-CM

## 2018-07-13 HISTORY — DX: Hyperkalemia: E87.5

## 2018-08-23 DIAGNOSIS — L853 Xerosis cutis: Secondary | ICD-10-CM | POA: Insufficient documentation

## 2018-08-23 HISTORY — DX: Xerosis cutis: L85.3

## 2018-09-21 DIAGNOSIS — K829 Disease of gallbladder, unspecified: Secondary | ICD-10-CM | POA: Insufficient documentation

## 2018-09-21 HISTORY — DX: Disease of gallbladder, unspecified: K82.9

## 2018-10-12 DIAGNOSIS — Z7901 Long term (current) use of anticoagulants: Secondary | ICD-10-CM

## 2018-10-12 HISTORY — DX: Long term (current) use of anticoagulants: Z79.01

## 2018-12-19 DIAGNOSIS — M1A09X0 Idiopathic chronic gout, multiple sites, without tophus (tophi): Secondary | ICD-10-CM

## 2018-12-19 HISTORY — DX: Idiopathic chronic gout, multiple sites, without tophus (tophi): M1A.09X0

## 2019-06-29 DIAGNOSIS — N179 Acute kidney failure, unspecified: Secondary | ICD-10-CM

## 2019-06-29 HISTORY — DX: Acute kidney failure, unspecified: N17.9

## 2019-10-21 ENCOUNTER — Ambulatory Visit: Payer: Medicare Other

## 2020-02-12 ENCOUNTER — Emergency Department (HOSPITAL_COMMUNITY): Payer: Medicare Other

## 2020-02-12 ENCOUNTER — Inpatient Hospital Stay (HOSPITAL_COMMUNITY)
Admission: EM | Admit: 2020-02-12 | Discharge: 2020-02-19 | DRG: 291 | Disposition: A | Discharge status: Skilled Nursing Facility | Payer: Medicare Other | Attending: Internal Medicine | Admitting: Internal Medicine

## 2020-02-12 ENCOUNTER — Other Ambulatory Visit: Payer: Self-pay

## 2020-02-12 DIAGNOSIS — N289 Disorder of kidney and ureter, unspecified: Secondary | ICD-10-CM

## 2020-02-12 DIAGNOSIS — Z6833 Body mass index (BMI) 33.0-33.9, adult: Secondary | ICD-10-CM

## 2020-02-12 DIAGNOSIS — Z7901 Long term (current) use of anticoagulants: Secondary | ICD-10-CM | POA: Diagnosis not present

## 2020-02-12 DIAGNOSIS — I10 Essential (primary) hypertension: Secondary | ICD-10-CM | POA: Diagnosis present

## 2020-02-12 DIAGNOSIS — L03115 Cellulitis of right lower limb: Secondary | ICD-10-CM | POA: Diagnosis present

## 2020-02-12 DIAGNOSIS — N179 Acute kidney failure, unspecified: Secondary | ICD-10-CM | POA: Diagnosis not present

## 2020-02-12 DIAGNOSIS — Z20822 Contact with and (suspected) exposure to covid-19: Secondary | ICD-10-CM | POA: Diagnosis present

## 2020-02-12 DIAGNOSIS — N183 Chronic kidney disease, stage 3 unspecified: Secondary | ICD-10-CM | POA: Diagnosis present

## 2020-02-12 DIAGNOSIS — I4819 Other persistent atrial fibrillation: Secondary | ICD-10-CM | POA: Diagnosis present

## 2020-02-12 DIAGNOSIS — R0602 Shortness of breath: Secondary | ICD-10-CM | POA: Diagnosis present

## 2020-02-12 DIAGNOSIS — D649 Anemia, unspecified: Secondary | ICD-10-CM | POA: Diagnosis present

## 2020-02-12 DIAGNOSIS — I130 Hypertensive heart and chronic kidney disease with heart failure and stage 1 through stage 4 chronic kidney disease, or unspecified chronic kidney disease: Secondary | ICD-10-CM | POA: Diagnosis not present

## 2020-02-12 DIAGNOSIS — I5031 Acute diastolic (congestive) heart failure: Secondary | ICD-10-CM | POA: Diagnosis not present

## 2020-02-12 DIAGNOSIS — E1122 Type 2 diabetes mellitus with diabetic chronic kidney disease: Secondary | ICD-10-CM | POA: Diagnosis present

## 2020-02-12 DIAGNOSIS — I4891 Unspecified atrial fibrillation: Secondary | ICD-10-CM | POA: Diagnosis not present

## 2020-02-12 DIAGNOSIS — I872 Venous insufficiency (chronic) (peripheral): Secondary | ICD-10-CM | POA: Diagnosis not present

## 2020-02-12 DIAGNOSIS — D696 Thrombocytopenia, unspecified: Secondary | ICD-10-CM | POA: Diagnosis present

## 2020-02-12 DIAGNOSIS — E875 Hyperkalemia: Secondary | ICD-10-CM | POA: Diagnosis present

## 2020-02-12 DIAGNOSIS — Z9111 Patient's noncompliance with dietary regimen: Secondary | ICD-10-CM

## 2020-02-12 DIAGNOSIS — Z96641 Presence of right artificial hip joint: Secondary | ICD-10-CM | POA: Diagnosis present

## 2020-02-12 DIAGNOSIS — I5033 Acute on chronic diastolic (congestive) heart failure: Secondary | ICD-10-CM | POA: Diagnosis not present

## 2020-02-12 DIAGNOSIS — R17 Unspecified jaundice: Secondary | ICD-10-CM

## 2020-02-12 DIAGNOSIS — Z79899 Other long term (current) drug therapy: Secondary | ICD-10-CM | POA: Diagnosis not present

## 2020-02-12 DIAGNOSIS — Z794 Long term (current) use of insulin: Secondary | ICD-10-CM | POA: Diagnosis not present

## 2020-02-12 DIAGNOSIS — Z79890 Hormone replacement therapy (postmenopausal): Secondary | ICD-10-CM

## 2020-02-12 DIAGNOSIS — Z833 Family history of diabetes mellitus: Secondary | ICD-10-CM

## 2020-02-12 DIAGNOSIS — R609 Edema, unspecified: Secondary | ICD-10-CM | POA: Diagnosis not present

## 2020-02-12 DIAGNOSIS — E1321 Other specified diabetes mellitus with diabetic nephropathy: Secondary | ICD-10-CM

## 2020-02-12 DIAGNOSIS — L03119 Cellulitis of unspecified part of limb: Secondary | ICD-10-CM | POA: Diagnosis not present

## 2020-02-12 DIAGNOSIS — M109 Gout, unspecified: Secondary | ICD-10-CM | POA: Diagnosis present

## 2020-02-12 HISTORY — DX: Acute on chronic diastolic (congestive) heart failure: I50.33

## 2020-02-12 HISTORY — DX: Presence of right artificial hip joint: Z96.641

## 2020-02-12 LAB — COMPREHENSIVE METABOLIC PANEL
ALT: 34 U/L (ref 0–44)
AST: 22 U/L (ref 15–41)
Albumin: 4.2 g/dL (ref 3.5–5.0)
Alkaline Phosphatase: 108 U/L (ref 38–126)
Anion gap: 9 (ref 5–15)
BUN: 68 mg/dL — ABNORMAL HIGH (ref 8–23)
CO2: 20 mmol/L — ABNORMAL LOW (ref 22–32)
Calcium: 9.1 mg/dL (ref 8.9–10.3)
Chloride: 107 mmol/L (ref 98–111)
Creatinine, Ser: 2.16 mg/dL — ABNORMAL HIGH (ref 0.61–1.24)
GFR calc Af Amer: 33 mL/min — ABNORMAL LOW (ref >60)
GFR calc non Af Amer: 28 mL/min — ABNORMAL LOW (ref >60)
Glucose, Bld: 340 mg/dL — ABNORMAL HIGH (ref 70–99)
Potassium: 4.7 mmol/L (ref 3.5–5.1)
Sodium: 136 mmol/L (ref 135–145)
Total Bilirubin: 2 mg/dL — ABNORMAL HIGH (ref 0.3–1.2)
Total Protein: 6.6 g/dL (ref 6.5–8.1)

## 2020-02-12 LAB — URINALYSIS, ROUTINE W REFLEX MICROSCOPIC
Bacteria, UA: NONE SEEN
Bilirubin Urine: NEGATIVE
Glucose, UA: 150 mg/dL — AB
Ketones, ur: NEGATIVE mg/dL
Leukocytes,Ua: NEGATIVE
Nitrite: NEGATIVE
Protein, ur: NEGATIVE mg/dL
Specific Gravity, Urine: 1.005 (ref 1.005–1.030)
pH: 5 (ref 5.0–8.0)

## 2020-02-12 LAB — CBC WITH DIFFERENTIAL/PLATELET
Abs Immature Granulocytes: 0.01 10*3/uL (ref 0.00–0.07)
Basophils Absolute: 0 10*3/uL (ref 0.0–0.1)
Basophils Relative: 1 %
Eosinophils Absolute: 0 10*3/uL (ref 0.0–0.5)
Eosinophils Relative: 1 %
HCT: 29.2 % — ABNORMAL LOW (ref 39.0–52.0)
Hemoglobin: 9.9 g/dL — ABNORMAL LOW (ref 13.0–17.0)
Immature Granulocytes: 0 %
Lymphocytes Relative: 14 %
Lymphs Abs: 0.8 10*3/uL (ref 0.7–4.0)
MCH: 30.8 pg (ref 26.0–34.0)
MCHC: 33.9 g/dL (ref 30.0–36.0)
MCV: 91 fL (ref 80.0–100.0)
Monocytes Absolute: 0.5 10*3/uL (ref 0.1–1.0)
Monocytes Relative: 8 %
Neutro Abs: 4.5 10*3/uL (ref 1.7–7.7)
Neutrophils Relative %: 76 %
Platelets: 88 10*3/uL — ABNORMAL LOW (ref 150–400)
RBC: 3.21 MIL/uL — ABNORMAL LOW (ref 4.22–5.81)
RDW: 15.6 % — ABNORMAL HIGH (ref 11.5–15.5)
WBC: 5.9 10*3/uL (ref 4.0–10.5)
nRBC: 0 % (ref 0.0–0.2)

## 2020-02-12 LAB — SARS CORONAVIRUS 2 BY RT PCR (HOSPITAL ORDER, PERFORMED IN ~~LOC~~ HOSPITAL LAB): SARS Coronavirus 2: NEGATIVE

## 2020-02-12 LAB — HEMOGLOBIN A1C
Hgb A1c MFr Bld: 8.4 % — ABNORMAL HIGH (ref 4.8–5.6)
Mean Plasma Glucose: 194.38 mg/dL

## 2020-02-12 LAB — CBG MONITORING, ED
Glucose-Capillary: 304 mg/dL — ABNORMAL HIGH (ref 70–99)
Glucose-Capillary: 352 mg/dL — ABNORMAL HIGH (ref 70–99)

## 2020-02-12 LAB — GLUCOSE, CAPILLARY: Glucose-Capillary: 225 mg/dL — ABNORMAL HIGH (ref 70–99)

## 2020-02-12 MED ORDER — VITAMIN D 25 MCG (1000 UNIT) PO TABS
2000.0000 [IU] | ORAL_TABLET | Freq: Every day | ORAL | Status: DC
  Administered 2020-02-12 – 2020-02-19 (×8): 2000 [IU] via ORAL
  Filled 2020-02-12 (×8): qty 2

## 2020-02-12 MED ORDER — INSULIN ASPART 100 UNIT/ML ~~LOC~~ SOLN
0.0000 [IU] | Freq: Every day | SUBCUTANEOUS | Status: DC
  Administered 2020-02-12 – 2020-02-14 (×2): 2 [IU] via SUBCUTANEOUS

## 2020-02-12 MED ORDER — FERROUS SULFATE 325 (65 FE) MG PO TABS
325.0000 mg | ORAL_TABLET | Freq: Every day | ORAL | Status: DC
  Administered 2020-02-13 – 2020-02-19 (×7): 325 mg via ORAL
  Filled 2020-02-12 (×7): qty 1

## 2020-02-12 MED ORDER — NON FORMULARY: Freq: Every day | Status: DC

## 2020-02-12 MED ORDER — SIMVASTATIN 20 MG PO TABS
20.0000 mg | ORAL_TABLET | Freq: Every day | ORAL | Status: DC
  Administered 2020-02-12 – 2020-02-18 (×7): 20 mg via ORAL
  Filled 2020-02-12 (×7): qty 1

## 2020-02-12 MED ORDER — DILTIAZEM HCL ER COATED BEADS 180 MG PO CP24
360.0000 mg | ORAL_CAPSULE | Freq: Every day | ORAL | Status: DC
  Administered 2020-02-12 – 2020-02-19 (×8): 360 mg via ORAL
  Filled 2020-02-12 (×6): qty 2
  Filled 2020-02-12: qty 1
  Filled 2020-02-12: qty 2
  Filled 2020-02-12: qty 3

## 2020-02-12 MED ORDER — ONDANSETRON HCL 4 MG/2ML IJ SOLN: 4.0000 mg | Freq: Four times a day (QID) | INTRAMUSCULAR | Status: DC | PRN

## 2020-02-12 MED ORDER — SILODOSIN 8 MG PO CAPS
8.0000 mg | ORAL_CAPSULE | Freq: Every day | ORAL | Status: DC
  Administered 2020-02-13 – 2020-02-19 (×7): 8 mg via ORAL
  Filled 2020-02-12 (×7): qty 1

## 2020-02-12 MED ORDER — FUROSEMIDE 10 MG/ML IJ SOLN
40.0000 mg | Freq: Two times a day (BID) | INTRAMUSCULAR | Status: DC
  Administered 2020-02-12 – 2020-02-15 (×6): 40 mg via INTRAVENOUS
  Filled 2020-02-12 (×6): qty 4

## 2020-02-12 MED ORDER — RISAQUAD PO CAPS
1.0000 | ORAL_CAPSULE | Freq: Every day | ORAL | Status: DC
  Administered 2020-02-12 – 2020-02-19 (×8): 1 via ORAL
  Filled 2020-02-12 (×9): qty 1

## 2020-02-12 MED ORDER — INSULIN GLARGINE 100 UNIT/ML ~~LOC~~ SOLN
40.0000 [IU] | Freq: Every day | SUBCUTANEOUS | Status: DC
  Administered 2020-02-12 – 2020-02-19 (×8): 40 [IU] via SUBCUTANEOUS
  Filled 2020-02-12 (×9): qty 0.4

## 2020-02-12 MED ORDER — DILTIAZEM HCL ER BEADS 240 MG PO CP24: 360.0000 mg | ORAL_CAPSULE | Freq: Every day | ORAL | Status: DC

## 2020-02-12 MED ORDER — SODIUM CHLORIDE 0.9% FLUSH
3.0000 mL | Freq: Once | INTRAVENOUS | Status: AC
  Administered 2020-02-12: 3 mL via INTRAVENOUS

## 2020-02-12 MED ORDER — LEVOTHYROXINE SODIUM 100 MCG PO TABS: 100.0000 ug | ORAL_TABLET | ORAL | Status: DC

## 2020-02-12 MED ORDER — SODIUM CHLORIDE 0.9% FLUSH: 3.0000 mL | INTRAVENOUS | Status: DC | PRN

## 2020-02-12 MED ORDER — APIXABAN 5 MG PO TABS
5.0000 mg | ORAL_TABLET | Freq: Two times a day (BID) | ORAL | Status: DC
  Administered 2020-02-12 – 2020-02-19 (×15): 5 mg via ORAL
  Filled 2020-02-12 (×17): qty 1

## 2020-02-12 MED ORDER — FUROSEMIDE 10 MG/ML IJ SOLN
40.0000 mg | Freq: Once | INTRAMUSCULAR | Status: AC
  Administered 2020-02-12: 40 mg via INTRAVENOUS
  Filled 2020-02-12: qty 4

## 2020-02-12 MED ORDER — LEVOTHYROXINE SODIUM 50 MCG PO TABS
50.0000 ug | ORAL_TABLET | ORAL | Status: DC
  Administered 2020-02-18: 50 ug via ORAL
  Filled 2020-02-12: qty 1

## 2020-02-12 MED ORDER — LEVOTHYROXINE SODIUM 100 MCG PO TABS
100.0000 ug | ORAL_TABLET | ORAL | Status: DC
  Administered 2020-02-13 – 2020-02-19 (×6): 100 ug via ORAL
  Filled 2020-02-12 (×6): qty 1

## 2020-02-12 MED ORDER — SPIRONOLACTONE 25 MG PO TABS
50.0000 mg | ORAL_TABLET | Freq: Every day | ORAL | Status: DC
  Administered 2020-02-12 – 2020-02-13 (×2): 50 mg via ORAL
  Filled 2020-02-12 (×3): qty 2

## 2020-02-12 MED ORDER — ALLOPURINOL 300 MG PO TABS
300.0000 mg | ORAL_TABLET | Freq: Every day | ORAL | Status: DC
  Administered 2020-02-12 – 2020-02-15 (×4): 300 mg via ORAL
  Filled 2020-02-12: qty 3
  Filled 2020-02-12 (×4): qty 1

## 2020-02-12 MED ORDER — SODIUM CHLORIDE 0.9 % IV SOLN: 250.0000 mL | INTRAVENOUS | Status: DC | PRN

## 2020-02-12 MED ORDER — VITAMIN D 50 MCG (2000 UT) PO CAPS: 2000.0000 [IU] | ORAL_CAPSULE | Freq: Every day | ORAL | Status: DC

## 2020-02-12 MED ORDER — INSULIN ASPART 100 UNIT/ML ~~LOC~~ SOLN
0.0000 [IU] | Freq: Three times a day (TID) | SUBCUTANEOUS | Status: DC
  Administered 2020-02-12: 15 [IU] via SUBCUTANEOUS
  Administered 2020-02-12: 11 [IU] via SUBCUTANEOUS
  Administered 2020-02-13: 2 [IU] via SUBCUTANEOUS
  Administered 2020-02-13 – 2020-02-14 (×2): 5 [IU] via SUBCUTANEOUS
  Administered 2020-02-14: 11 [IU] via SUBCUTANEOUS
  Administered 2020-02-15: 5 [IU] via SUBCUTANEOUS
  Administered 2020-02-15 (×2): 3 [IU] via SUBCUTANEOUS
  Administered 2020-02-16: 8 [IU] via SUBCUTANEOUS
  Administered 2020-02-16: 2 [IU] via SUBCUTANEOUS
  Administered 2020-02-16: 8 [IU] via SUBCUTANEOUS
  Administered 2020-02-17: 5 [IU] via SUBCUTANEOUS
  Administered 2020-02-17: 3 [IU] via SUBCUTANEOUS
  Administered 2020-02-18: 2 [IU] via SUBCUTANEOUS
  Administered 2020-02-18: 3 [IU] via SUBCUTANEOUS
  Administered 2020-02-18 – 2020-02-19 (×2): 5 [IU] via SUBCUTANEOUS
  Administered 2020-02-19: 3 [IU] via SUBCUTANEOUS

## 2020-02-12 MED ORDER — SODIUM CHLORIDE 0.9% FLUSH
3.0000 mL | Freq: Two times a day (BID) | INTRAVENOUS | Status: DC
  Administered 2020-02-12 – 2020-02-19 (×12): 3 mL via INTRAVENOUS

## 2020-02-12 MED ORDER — ACETAMINOPHEN 325 MG PO TABS
650.0000 mg | ORAL_TABLET | ORAL | Status: DC | PRN
  Administered 2020-02-13 – 2020-02-19 (×4): 650 mg via ORAL
  Filled 2020-02-12 (×4): qty 2

## 2020-02-12 MED ORDER — PROBIOTIC ACIDOPHILUS BEADS PO CAPS: ORAL_CAPSULE | Freq: Every day | ORAL | Status: DC

## 2020-02-12 MED ORDER — INSULIN ASPART 100 UNIT/ML ~~LOC~~ SOLN
8.0000 [IU] | Freq: Three times a day (TID) | SUBCUTANEOUS | Status: DC
  Administered 2020-02-12 – 2020-02-19 (×18): 8 [IU] via SUBCUTANEOUS

## 2020-02-12 NOTE — H&P (Signed)
Date: 02/12/2020               Patient Name:  Eric [name] MRN: [id number]  DOB: 1941-07-05 Age / Sex: 79 y.o., male   PCP: [name]         Medical Service: Internal Medicine Teaching Service         Attending Physician: Dr. Velna Ochs, MD    First Contact: Dr. Marianna Payment Pager: [phone]  Second Contact: Dr. Koleen Distance Pager: [phone]       After Hours (After 5p/  First Contact Pager: [phone]  weekends / holidays): Second Contact Pager: [phone]   Chief Complaint: Leg swelling  History of Present Illness: Eric [name] is a 79 y.o male with diabetes mellitus on insulin therapy, morbid obesity, hypertension, persistent atrial fibrillation on Eliquis, and diastolic heart failure who presented to the emergency department with two weeks of bilateral lower extremity swelling. History was obtained via the patient and through chart review.  The patient states that he usually follows strict fluid restriction and low sodium diet; however, as the weather has become hotter he is started to drink more fluids. Over the last two weeks his noticed increasing bilateral lower extremity edema has now moved up to his hips. With this he is noticed that his legs are now weeping and forming blisters. He is typically on torsemide 20 mg daily and he has increase this to 40 mg daily. He feels like even with the increase he has been unable to decrease his swelling. He elevates his legs 2 to 3 times daily but has not found this to be helpful. He has noticed some dyspnea on exertion. 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 For the last couple days he has lived a sedentary life. In addition he has noticed some orthopnea. He has been taught his doctors multiple times over the last two weeks and given his inability to control his symptoms felt that he needed  to be evaluated at the hospital.  He denies fevers/chills, headaches, rhinorrhea, sore throat, chest pain, palpitations, abdominal pain, nausea/vomiting, early satiety, decreased urine output, lethargy, weakness, myalgias, arthralgias.  Meds: Current Meds  Medication Sig  . allopurinol (ZYLOPRIM) 300 MG tablet Take 300 mg by mouth daily.  . Cholecalciferol (VITAMIN D) 50 MCG (2000 UT) CAPS Take 2,000 Units by mouth daily.  Marland Kitchen diltiazem (TIAZAC) 360 MG 24 hr capsule Take 360 mg by mouth daily.  Marland Kitchen ELIQUIS 5 MG TABS tablet Take 5 mg by mouth 2 (two) times daily. Taking once daily  . ferrous sulfate 325 (65 FE) MG tablet Take 325 mg by mouth daily with breakfast.  . insulin glargine, 1 Unit Dial, (TOUJEO SOLOSTAR) 300 UNIT/ML Solostar Pen Inject 46 Units into the skin daily.  . insulin lispro (HUMALOG) 100 UNIT/ML KwikPen Inject 14 Units into the skin 3 (three) times daily.   Marland Kitchen levothyroxine (SYNTHROID) 100 MCG tablet Take 100 mcg by mouth See admin instructions. Taking 1/2 tablet (78mcg) on Sunday only.  . Probiotic Product (PROBIOTIC ACIDOPHILUS BEADS PO) Take 1 tablet by mouth daily.  . silodosin (RAPAFLO) 8 MG CAPS capsule Take 8 mg by mouth daily.  . simvastatin (ZOCOR) 20 MG tablet Take 20 mg by mouth at bedtime.  Marland Kitchen spironolactone (ALDACTONE) 50 MG tablet Take 50 mg by mouth daily.  Marland Kitchen torsemide (  DEMADEX) 20 MG tablet Take 20 mg by mouth daily.  . Vitamins C E (VITAMIN C & E COMPLEX PO) Take 1 tablet by mouth daily. Also has vit A 487mcg,, zinc   Allergies: Allergies as of 02/12/2020 - Review Complete 02/12/2020  Allergen Reaction Noted  . Hydrocodone Nausea And Vomiting 07/13/2018  . Iohexol  11/22/2006  . Morphine and related  02/12/2020  . Sulfa antibiotics  02/12/2020  . Sulfonamide derivatives     No past medical history on file.  Family History: Family history is significant for diabetes and hypertension.  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 He lives at home with his wife and their one dog. They recently moved to a townhouse community in [place]. Previously used alcohol and tobacco but has not used them in several years. Denies use of illicit substances.  Review of Systems: A complete ROS was negative except as per HPI.   Physical Exam: Blood pressure (!) 167/84, pulse 62, temperature 97.8 F (36.6 C), temperature source Oral, resp. rate 14, height 6' (1.829 m), weight 122.5 kg, SpO2 100 %.  General: Obese male in no acute distress HENT: Normocephalic, atraumatic, moist mucus membranes Pulm: Good air movement with no wheezing or crackles  CV: Irregular rhythm, no murmurs, no rubs, + JVD  Abdomen: Active bowel sounds, soft, non-distended, no tenderness to palpation  Extremities: Pulses palpable in all extremities, 4+ BLE edema  Skin: Warm and dry  Neuro: Alert and oriented x 3  EKG: personally reviewed my interpretation is atrial fibrillation   CXR: personally reviewed my interpretation is right pleural effusion with cardiomegaly and increased pulmonary vascular congestion.   Assessment & Plan by Problem: Principal Problem:   Diabetes mellitus ([place]) Active Problems:   Essential hypertension, benign   Atrial fibrillation ([name])   Acute on chronic diastolic heart failure ([name])  Eric [name] is a 79 y.o male with diabetes mellitus on insulin therapy, morbid obesity, hypertension, persistent atrial fibrillation on Eliquis, and diastolic heart failure who presented to the emergency department with two weeks of bilateral lower extremity swelling in the setting of dietary nonadherence. He was subsequently admitted for acute on chronic diastolic heart failure needing IV diuretics.  Acute on chronic diastolic heart failure - Hemodynamically stable with 4+ pitting edema to the mid thighs - Has had great UOP with IV furosemide 40 mg. Will continue BID dosing  - Strict I&O's    - Daily weights  - Continue spironolactone  - Repeat echocardiogram   Chronic Atrial Fibrillation  - Continue Diltiazem for rate control and Eliquis for anticoagulation   DM  - Start Lantus 40 units QD + Novolog 8 units TID WC + SSI - Carb modified diet  CKD Stage III - Renal function at baseline  - Monitor closely   Anemia and Thrombocytopenia  - At baseline  - Continue to monitor   Diet: Carb mod with fluid restriction  VTE ppx: Eliquis  CODE STATUS: Full   Dispo: Admit patient to Inpatient with expected length of stay greater than 2 midnights.  SignedIna Homes, MD 02/12/2020, 10:40 AM  Pager: [phone]

## 2020-02-12 NOTE — Progress Notes (Signed)
[facility]  Consult Note  Presented to the bedside to evaluate the patient and offer enrollment into the [facility] program for Acute on Chronic HFpEF needing diureses. Unfortunately, the patient declined enrollment.   Please do not hesitate to call with questions/concerns.   Ina Homes, MD 02/12/2020, 8:27 AM  Cell [phone]

## 2020-02-12 NOTE — ED Notes (Signed)
Labs timed at 0357, specimens landed at lab at [place]. Lactic will need to be recollected

## 2020-02-12 NOTE — ED Provider Notes (Signed)
[facility] EMERGENCY DEPARTMENT Provider Note   CSN: [id number] Arrival date & time: 02/12/20  0998   History Chief Complaint  Patient presents with  . Leg Swelling  . Hyperglycemia    Eric [name] is a 79 y.o. male.  The history is provided by the patient.  Hyperglycemia He has history of hypertension, diabetes, atrial fibrillation anticoagulated on apixaban, chronic diastolic heart failure, chronic kidney disease comes in because of progressive swelling of his lower legs.  He had seen his dermatologist who gave him a steroid ointment because he felt there were warts underneath the skin.  Patient states that since starting on the steroid, his blood sugar has been running high and his legs have been weeping.  He has tried increasing his torsemide without any improvement.  He denies chest pain, heaviness, tightness, pressure.  He denies dyspnea.  No past medical history on file.  Patient Active Problem List   Diagnosis Date Noted  . DIASTOLIC HEART FAILURE, CHRONIC 02/07/2009  . DM 02/06/2009  . OBESITY 02/06/2009  . ESSENTIAL HYPERTENSION, BENIGN 02/06/2009  . ATRIAL FIBRILLATION 01/15/2009    ** The histories are not reviewed yet. Please review them in the "History" navigator section and refresh this [place].     No family history on file.  Social History   Tobacco Use  . Smoking status: Not on file  Substance Use Topics  . Alcohol use: Not on file  . Drug use: Not on file    Home Medications Prior to Admission medications   Not on File    Allergies    Iohexol, Morphine and related, Sulfa antibiotics, and Sulfonamide derivatives  Review of Systems   Review of Systems  All other systems reviewed and are negative.   Physical Exam Updated Vital Signs BP (!) 172/75   Pulse 64   Temp 97.8 F (36.6 C) (Oral)   Resp 16   Ht 6' (1.829 m)   Wt 122.5 kg   SpO2 100%   BMI 36.62 kg/m   Physical Exam Vitals and nursing note reviewed.    79 year old male, resting comfortably and in no acute distress. Vital signs are significant for elevated blood pressure. Oxygen saturation is 100%, which is normal. Head is normocephalic and atraumatic. PERRLA, EOMI. Oropharynx is clear. Neck is nontender and supple without adenopathy or JVD. Back is nontender and there is no CVA tenderness.  There is 2+ presacral edema. Lungs are clear without rales, wheezes, or rhonchi. Chest is nontender. Heart has regular rate and rhythm without murmur. Abdomen is soft, flat, nontender without masses or hepatosplenomegaly and peristalsis is normoactive. Extremities have 4+ edema, full range of motion is present.  Moderate venous stasis changes are present. Skin is warm and dry without other rash. Neurologic: Mental status is normal, cranial nerves are intact, there are no motor or sensory deficits.     ED Results / Procedures / Treatments   Labs (all labs ordered are listed, but only abnormal results are displayed) Labs Reviewed  COMPREHENSIVE METABOLIC PANEL - Abnormal; Notable for the following components:      Result Value   CO2 20 (*)    Glucose, Bld 340 (*)    BUN 68 (*)    Creatinine, Ser 2.16 (*)    Total Bilirubin 2.0 (*)    GFR calc non Af Amer 28 (*)    GFR calc Af Amer 33 (*)    All other components within normal limits  CBC WITH DIFFERENTIAL/PLATELET -  Abnormal; Notable for the following components:   RBC 3.21 (*)    Hemoglobin 9.9 (*)    HCT 29.2 (*)    RDW 15.6 (*)    Platelets 88 (*)    All other components within normal limits  LACTIC ACID, PLASMA  URINALYSIS, ROUTINE W REFLEX MICROSCOPIC    EKG EKG Interpretation  Date/Time:  Monday Feb 12 2020 03:48:53 EDT Ventricular Rate:  56 PR Interval:    QRS Duration: 84 QT Interval:  420 QTC Calculation: 405 R Axis:   103 Text Interpretation: Atrial fibrillation with slow ventricular response Rightward axis Low voltage QRS Septal infarct , age undetermined Abnormal  ECG When compared with ECG of 12/19/2009, QT has shortened HEART RATE has decreased Confirmed by Delora Fuel ([id number]) on 02/12/2020 7:09:38 AM   Radiology DG Chest 2 View  Result Date: 02/12/2020 CLINICAL DATA:  Shortness of breath EXAM: CHEST - 2 VIEW COMPARISON:  08/07/2019 FINDINGS: Small right pleural effusion which was also seen on prior. Cardiomegaly. There is no edema, consolidation, or pneumothorax. IMPRESSION: Cardiomegaly and small right pleural effusion, which was also seen on a 2020 chest x-ray. Electronically Signed   By: Monte Fantasia M.D.   On: 02/12/2020 04:23    Procedures Procedures  Medications Ordered in ED Medications  sodium chloride flush (NS) 0.9 % injection 3 mL (3 mLs Intravenous Given 02/12/20 0606)    ED Course  I have reviewed the triage vital signs and the nursing notes.  Pertinent labs & imaging results that were available during my care of the patient were reviewed by me and considered in my medical decision making (see chart for details).  MDM Rules/Calculators/A&P Worsening peripheral edema which is now 4+.  I believe that the weeping is purely secondary to edema and not related to the topical steroid.  However, hyperglycemia may be related to the topical steroid.  Chest x-ray shows cardiomegaly and a small right pleural effusion which are not significantly changed from prior x-ray.  ECG shows atrial fibrillation with slow ventricular response, but vital signs have shown adequate ventricular response.  Labs show mild to moderate hyperglycemia and moderate anemia which are unchanged from recent values.  Thrombocytopenia is present and not significantly changed from recent value.  Total bilirubin is elevated and not significantly worse than prior values.  For labs, all priors were noted at [facility] and seen on care everywhere.  He clearly needs additional diuresis.  He is given a dose of intravenous furosemide and will be observed for  response.  Case is signed out to Dr. Rex Kras.  Final Clinical Impression(s) / ED Diagnoses Final diagnoses:  Acute on chronic diastolic heart failure ([place])  Renal insufficiency  Normochromic normocytic anemia  Thrombocytopenia ([name])  Serum total bilirubin elevated    Rx / DC Orders ED Discharge Orders    None       Delora Fuel, MD 29/92/42 [phone]

## 2020-02-12 NOTE — ED Triage Notes (Addendum)
Pt in w/BLE edema, pain and weeping. States he has been seeing a dermatologist for "warts" on his lower extremities. Puts 2 topical creams on for trx, and he states the wounds started to open last week, began weeping heavily. Denies any fevers, states his CBG's have been >400 PTA. No cp, says he's a little sob. Takes Turosemide. Not currently on abx.

## 2020-02-12 NOTE — ED Provider Notes (Signed)
I received pt in signout from Dr. Roxanne Mins. Had presented w/ persistent leg swelling and h/o CHF. At time of signout, received lasix and awaiting reassessment. Labs show Cr 2.16, most recent Cr 2.09.  Pt evaluated by [facility] hospital at home team as he would  Be good candidate for at home lasix program, however he refused stating home treatment doesn't work. He doesn't feel like he is capable of managing his CHF and leg swelling with weeping at home. Discussed admission w/ Internal Med teaching service.   Little, Wenda Overland, MD 02/12/20 1010

## 2020-02-12 NOTE — ED Notes (Signed)
ED TO INPATIENT HANDOFF REPORT  ED Nurse Name and Phone #: [phone]  S Name/Age/Gender Eric [name] 79 y.o. male Room/Bed: 014C/014C  Code Status   Code Status: Full Code  Home/SNF/Other Home Patient oriented to: self, place, time and situation Is this baseline? Yes   Triage Complete: Triage complete  Chief Complaint Acute on chronic diastolic heart failure ([place]) [I50.33]  Triage Note Pt in w/BLE edema, pain and weeping. States he has been seeing a dermatologist for "warts" on his lower extremities. Puts 2 topical creams on for trx, and he states the wounds started to open last week, began weeping heavily. Denies any fevers, states his CBG's have been >400 PTA. No cp, says he's a little sob. Takes Turosemide. Not currently on abx.     Allergies Allergies  Allergen Reactions  . Hydrocodone Nausea And Vomiting  . Iohexol      Desc: "shuts kidneys down"   . Morphine And Related   . Sulfa Antibiotics   . Sulfonamide Derivatives     Level of Care/Admitting Diagnosis ED Disposition    ED Disposition Condition [facility] Area: [place] [[id number]]  Level of Care: Telemetry Medical [104]  May admit patient to [facility] or [facility] if equivalent level of care is available:: No  Covid Evaluation: Asymptomatic Screening Protocol (No Symptoms)  Diagnosis: Acute on chronic diastolic heart failure ([place]) [428.33.ICD-9-CM]  Admitting Physician: Velna Ochs [[id number]]  Attending Physician: Velna Ochs [[id number]]  Estimated length of stay: past midnight tomorrow  Certification:: I certify this patient will need inpatient services for at least 2 midnights       B Medical/Surgery History No past medical history on file.    A IV Location/Drains/Wounds Patient Lines/Drains/Airways Status   Active Line/Drains/Airways    Name:   Placement date:   Placement time:   Site:   Days:   Peripheral IV 02/12/20 Right Forearm   02/12/20     0605    Forearm   less than 1          Intake/Output Last 24 hours  Intake/Output Summary (Last 24 hours) at 02/12/2020 1934 Last data filed at 02/12/2020 1543 Gross per 24 hour  Intake --  Output 1650 ml  Net -1650 ml    Labs/Imaging Results for orders placed or performed during the hospital encounter of 02/12/20 (from the past 48 hour(s))  Comprehensive metabolic panel     Status: Abnormal   Collection Time: 02/12/20  3:57 AM  Result Value Ref Range   Sodium 136 135 - 145 mmol/L   Potassium 4.7 3.5 - 5.1 mmol/L   Chloride 107 98 - 111 mmol/L   CO2 20 (L) 22 - 32 mmol/L   Glucose, Bld 340 (H) 70 - 99 mg/dL    Comment: Glucose reference range applies only to samples taken after fasting for at least 8 hours.   BUN 68 (H) 8 - 23 mg/dL   Creatinine, Ser 2.16 (H) 0.61 - 1.24 mg/dL   Calcium 9.1 8.9 - 10.3 mg/dL   Total Protein 6.6 6.5 - 8.1 g/dL   Albumin 4.2 3.5 - 5.0 g/dL   AST 22 15 - 41 U/L   ALT 34 0 - 44 U/L   Alkaline Phosphatase 108 38 - 126 U/L   Total Bilirubin 2.0 (H) 0.3 - 1.2 mg/dL   GFR calc non Af Amer 28 (L) >60 mL/min   GFR calc Af Amer 33 (L) >60 mL/min  Anion gap 9 5 - 15    Comment: Performed at [facility] [address], [place], [place] [address]  CBC with Differential     Status: Abnormal   Collection Time: 02/12/20  3:57 AM  Result Value Ref Range   WBC 5.9 4.0 - 10.5 K/uL   RBC 3.21 (L) 4.22 - 5.81 MIL/uL   Hemoglobin 9.9 (L) 13.0 - 17.0 g/dL   HCT 29.2 (L) 39.0 - 52.0 %   MCV 91.0 80.0 - 100.0 fL   MCH 30.8 26.0 - 34.0 pg   MCHC 33.9 30.0 - 36.0 g/dL   RDW 15.6 (H) 11.5 - 15.5 %   Platelets 88 (L) 150 - 400 K/uL    Comment: Immature Platelet Fraction may be clinically indicated, consider ordering this additional test [id number] PLATELET COUNT CONFIRMED BY SMEAR REPEATED TO VERIFY    nRBC 0.0 0.0 - 0.2 %   Neutrophils Relative % 76 %   Neutro Abs 4.5 1.7 - 7.7 K/uL   Lymphocytes Relative 14 %   Lymphs Abs 0.8 0.7 - 4.0 K/uL    Monocytes Relative 8 %   Monocytes Absolute 0.5 0.1 - 1.0 K/uL   Eosinophils Relative 1 %   Eosinophils Absolute 0.0 0.0 - 0.5 K/uL   Basophils Relative 1 %   Basophils Absolute 0.0 0.0 - 0.1 K/uL   Immature Granulocytes 0 %   Abs Immature Granulocytes 0.01 0.00 - 0.07 K/uL    Comment: Performed at [facility], [place] [address] [place], [place] [address]  Hemoglobin A1c     Status: Abnormal   Collection Time: 02/12/20  3:57 AM  Result Value Ref Range   Hgb A1c MFr Bld 8.4 (H) 4.8 - 5.6 %    Comment: (NOTE) Pre diabetes:          5.7%-6.4% Diabetes:              >6.4% Glycemic control for   <7.0% adults with diabetes    Mean Plasma Glucose 194.38 mg/dL    Comment: Performed at [facility] [address] [place], [place] [address]  Urinalysis, Routine w reflex microscopic     Status: Abnormal   Collection Time: 02/12/20  7:52 AM  Result Value Ref Range   Color, Urine COLORLESS (A) YELLOW   APPearance CLEAR CLEAR   Specific Gravity, Urine 1.005 1.005 - 1.030   pH 5.0 5.0 - 8.0   Glucose, UA 150 (A) NEGATIVE mg/dL   Hgb urine dipstick SMALL (A) NEGATIVE   Bilirubin Urine NEGATIVE NEGATIVE   Ketones, ur NEGATIVE NEGATIVE mg/dL   Protein, ur NEGATIVE NEGATIVE mg/dL   Nitrite NEGATIVE NEGATIVE   Leukocytes,Ua NEGATIVE NEGATIVE   RBC / HPF 0-5 0 - 5 RBC/hpf   Bacteria, UA NONE SEEN NONE SEEN   Squamous Epithelial / LPF 0-5 0 - 5   Hyaline Casts, UA PRESENT     Comment: Performed at [facility] [address] [place], [place] [address]  CBG monitoring, ED     Status: Abnormal   Collection Time: 02/12/20 11:42 AM  Result Value Ref Range   Glucose-Capillary 304 (H) 70 - 99 mg/dL    Comment: Glucose reference range applies only to samples taken after fasting for at least 8 hours.  SARS Coronavirus 2 by RT PCR (hospital order, performed in [facility] hospital lab) Nasopharyngeal Nasopharyngeal Swab     Status: None   Collection Time: 02/12/20 11:46 AM  Specimen: Nasopharyngeal Swab  Result Value Ref Range   SARS Coronavirus 2 NEGATIVE NEGATIVE    Comment: (NOTE) SARS-CoV-2 target nucleic acids are NOT DETECTED. The SARS-CoV-2 RNA is generally detectable in upper and lower respiratory specimens during the acute phase of infection. The lowest concentration of SARS-CoV-2 viral copies this assay can detect is 250 copies / mL. A negative result does not preclude SARS-CoV-2 infection and should not be used as the sole basis for treatment or other patient management decisions.  A negative result may occur with improper specimen collection / handling, submission of specimen other than nasopharyngeal swab, presence of viral mutation(s) within the areas targeted by this assay, and inadequate number of viral copies (<250 copies / mL). A negative result must be combined with clinical observations, patient history, and epidemiological information. Fact Sheet for Patients:   StrictlyIdeas.no Fact Sheet for Healthcare Providers: BankingDealers.co.za This test is not yet approved or cleared  by the [place] FDA and has been authorized for detection and/or diagnosis of SARS-CoV-2 by FDA under an Emergency Use Authorization (EUA).  This EUA will remain in effect (meaning this test can be used) for the duration of the COVID-19 declaration under Section 564(b)(1) of the Act, 21 U.S.C. section 360bbb-3(b)(1), unless the authorization is terminated or revoked sooner. Performed at [facility], [place] [address] [place], [place] [address]   CBG monitoring, ED     Status: Abnormal   Collection Time: 02/12/20  5:18 PM  Result Value Ref Range   Glucose-Capillary 352 (H) 70 - 99 mg/dL    Comment: Glucose reference range applies only to samples taken after fasting for at least 8 hours.   DG Chest 2 View  Result Date: 02/12/2020 CLINICAL DATA:  Shortness of breath EXAM: CHEST - 2 VIEW COMPARISON:   08/07/2019 FINDINGS: Small right pleural effusion which was also seen on prior. Cardiomegaly. There is no edema, consolidation, or pneumothorax. IMPRESSION: Cardiomegaly and small right pleural effusion, which was also seen on a 2020 chest x-ray. Electronically Signed   By: Monte Fantasia M.D.   On: 02/12/2020 04:23    Pending Labs Unresulted Labs (From admission, onward)    Start     Ordered   02/13/20 4481  Basic metabolic panel  Daily,   R     02/12/20 1020          Vitals/Pain Today's Vitals   02/12/20 1600 02/12/20 1700 02/12/20 1800 02/12/20 1900  BP: (!) 148/56 (!) 147/62 (!) 159/56 (!) 156/60  Pulse: 68 (!) 52 69 (!) 54  Resp: 17 18 18  (!) 21  Temp:      TempSrc:      SpO2: 100% 99% 100% 99%  Weight:      Height:      PainSc:        Isolation Precautions No active isolations  Medications Medications  furosemide (LASIX) injection 40 mg (40 mg Intravenous Given 02/12/20 1543)  allopurinol (ZYLOPRIM) tablet 300 mg (300 mg Oral Given 02/12/20 1203)  apixaban (ELIQUIS) tablet 5 mg (5 mg Oral Given 02/12/20 1542)  ferrous sulfate tablet 325 mg (has no administration in time range)  insulin glargine (LANTUS) injection 40 Units (40 Units Subcutaneous Given 02/12/20 1720)  insulin aspart (novoLOG) injection 8 Units (8 Units Subcutaneous Given 02/12/20 1752)  simvastatin (ZOCOR) tablet 20 mg (has no administration in time range)  spironolactone (ALDACTONE) tablet 50 mg (50 mg Oral Given 02/12/20 1204)  sodium chloride flush (NS) 0.9 % injection 3  mL (3 mLs Intravenous Given 02/12/20 1209)  sodium chloride flush (NS) 0.9 % injection 3 mL (has no administration in time range)  0.9 %  sodium chloride infusion (has no administration in time range)  acetaminophen (TYLENOL) tablet 650 mg (has no administration in time range)  ondansetron (ZOFRAN) injection 4 mg (has no administration in time range)  insulin aspart (novoLOG) injection 0-5 Units (has no administration in time range)   insulin aspart (novoLOG) injection 0-15 Units (15 Units Subcutaneous Given 02/12/20 1736)  diltiazem (CARDIZEM CD) 24 hr capsule 360 mg (360 mg Oral Given 02/12/20 1204)  cholecalciferol (VITAMIN D3) tablet 2,000 Units (2,000 Units Oral Given 02/12/20 1204)  acidophilus (RISAQUAD) capsule 1 capsule (1 capsule Oral Given 02/12/20 1542)  silodosin (RAPAFLO) capsule 8 mg (has no administration in time range)  levothyroxine (SYNTHROID) tablet 100 mcg (has no administration in time range)    And  levothyroxine (SYNTHROID) tablet 50 mcg (has no administration in time range)  sodium chloride flush (NS) 0.9 % injection 3 mL (3 mLs Intravenous Given 02/12/20 0606)  furosemide (LASIX) injection 40 mg (40 mg Intravenous Given 02/12/20 0705)    Mobility walks with device     Focused Assessments Cardiac Assessment Handoff:    Lab Results  Component Value Date   CKTOTAL 62 12/18/2009   CKMB 1.4 12/18/2009   TROPONINI <0.01        NO INDICATION OF MYOCARDIAL INJURY. 12/18/2009   No results found for: DDIMER Does the Patient currently have chest pain? No     R Recommendations: See Admitting Provider Note  Report given to:   Additional Notes:

## 2020-02-13 ENCOUNTER — Other Ambulatory Visit: Payer: Self-pay

## 2020-02-13 ENCOUNTER — Inpatient Hospital Stay (HOSPITAL_COMMUNITY): Payer: Medicare Other

## 2020-02-13 ENCOUNTER — Encounter (HOSPITAL_COMMUNITY): Payer: Self-pay | Admitting: Internal Medicine

## 2020-02-13 DIAGNOSIS — I5033 Acute on chronic diastolic (congestive) heart failure: Secondary | ICD-10-CM

## 2020-02-13 DIAGNOSIS — I5031 Acute diastolic (congestive) heart failure: Secondary | ICD-10-CM

## 2020-02-13 DIAGNOSIS — L03119 Cellulitis of unspecified part of limb: Secondary | ICD-10-CM

## 2020-02-13 LAB — ECHOCARDIOGRAM COMPLETE
Height: 72 in
Weight: 4033.6 oz

## 2020-02-13 LAB — BASIC METABOLIC PANEL
Anion gap: 9 (ref 5–15)
BUN: 62 mg/dL — ABNORMAL HIGH (ref 8–23)
CO2: 23 mmol/L (ref 22–32)
Calcium: 9.1 mg/dL (ref 8.9–10.3)
Chloride: 107 mmol/L (ref 98–111)
Creatinine, Ser: 1.99 mg/dL — ABNORMAL HIGH (ref 0.61–1.24)
GFR calc Af Amer: 36 mL/min — ABNORMAL LOW (ref >60)
GFR calc non Af Amer: 31 mL/min — ABNORMAL LOW (ref >60)
Glucose, Bld: 174 mg/dL — ABNORMAL HIGH (ref 70–99)
Potassium: 4.4 mmol/L (ref 3.5–5.1)
Sodium: 139 mmol/L (ref 135–145)

## 2020-02-13 LAB — GLUCOSE, CAPILLARY
Glucose-Capillary: 213 mg/dL — ABNORMAL HIGH (ref 70–99)
Glucose-Capillary: 128 mg/dL — ABNORMAL HIGH (ref 70–99)
Glucose-Capillary: 89 mg/dL (ref 70–99)
Glucose-Capillary: 115 mg/dL — ABNORMAL HIGH (ref 70–99)

## 2020-02-13 MED ORDER — CEPHALEXIN 500 MG PO CAPS
500.0000 mg | ORAL_CAPSULE | Freq: Four times a day (QID) | ORAL | Status: DC
  Administered 2020-02-13 – 2020-02-15 (×7): 500 mg via ORAL
  Filled 2020-02-13 (×5): qty 1

## 2020-02-13 MED ORDER — OXYCODONE HCL 5 MG PO TABS
10.0000 mg | ORAL_TABLET | ORAL | Status: DC | PRN
  Administered 2020-02-13 – 2020-02-17 (×4): 10 mg via ORAL
  Filled 2020-02-13 (×4): qty 2

## 2020-02-13 NOTE — Progress Notes (Signed)
  Echocardiogram 2D Echocardiogram has been performed.  Eric [name] 02/13/2020, 10:18 AM

## 2020-02-13 NOTE — Progress Notes (Signed)
HD#1 Subjective:  Overnight Events: admitted  Eric [name] states that he had pain in his legs and has shortness of breath.   Objective:  Vital signs in last 24 hours: Vitals:   02/13/20 0427 02/13/20 0813 02/13/20 1147 02/13/20 1421  BP: (!) 160/66 (!) 170/79 (!) 136/48 (!) 105/48  Pulse:  72 84   Resp: 18 16 17    Temp: 98 F (36.7 C) 97.8 F (36.6 C) 98 F (36.7 C)   TempSrc: Oral Oral Oral   SpO2: 99% 98% 98% 99%  Weight:      Height:       Supplemental O2: RA SpO2: 99 %   Physical Exam:  Physical Exam  Filed Weights   02/12/20 0332 02/12/20 2034  Weight: 122.5 kg 114.4 kg     Intake/Output Summary (Last 24 hours) at 02/13/2020 1936 Last data filed at 02/13/2020 1736 Gross per 24 hour  Intake 320 ml  Output 3215 ml  Net -2895 ml   Net IO Since Admission: -4,545 mL [02/13/20 1936]  Pertinent Labs: CBC Latest Ref Rng & Units 02/12/2020 12/20/2009 12/19/2009  WBC 4.0 - 10.5 K/uL 5.9 5.6 6.0  Hemoglobin 13.0 - 17.0 g/dL 9.9(L) 10.2(L) 8.7(L)  Hematocrit 39.0 - 52.0 % 29.2(L) 29.0(L) 24.8(L)  Platelets 150 - 400 K/uL 88(L) 139(L) 146(L)    CMP Latest Ref Rng & Units 02/13/2020 02/12/2020 06/10/2010  Glucose 70 - 99 mg/dL 174(H) 340(H) 96  BUN 8 - 23 mg/dL 62(H) 68(H) 27(H)  Creatinine 0.61 - 1.24 mg/dL 1.99(H) 2.16(H) 1.5  Sodium 135 - 145 mmol/L 139 136 139  Potassium 3.5 - 5.1 mmol/L 4.4 4.7 5.0  Chloride 98 - 111 mmol/L 107 107 103  CO2 22 - 32 mmol/L 23 20(L) 28  Calcium 8.9 - 10.3 mg/dL 9.1 9.1 9.8  Total Protein 6.5 - 8.1 g/dL - 6.6 -  Total Bilirubin 0.3 - 1.2 mg/dL - 2.0(H) -  Alkaline Phos 38 - 126 U/L - 108 -  AST 15 - 41 U/L - 22 -  ALT 0 - 44 U/L - 34 -     Pending Labs: none  Imaging:  Echo: 1. Left ventricular ejection fraction, by estimation, is 60 to 65%. The left ventricle has normal function. The left ventricle has no regional wall motion abnormalities. Left ventricular diastolic parameters are indeterminate.   2. Right  ventricular systolic function is normal. The right ventricular size is mildly enlarged. Tricuspid regurgitation signal is inadequate for assessing PA pressure.   3. Left atrial size was severely dilated.   4. Right atrial size was moderately dilated.   5. The mitral valve is normal in structure. Trivial mitral valve regurgitation. No evidence of mitral stenosis.  6. The aortic valve is tricuspid. Aortic valve regurgitation is not visualized. Mild aortic valve stenosis. Aortic valve area, by VTI measures 1.71 cm. Aortic valve mean gradient measures 18.0 mmHg.   7. The inferior vena cava is dilated in size with <50% respiratory variability, suggesting right atrial pressure of 15 mmHg.   8. Small pericardial effusion.   9. The patient was in atrial fibrillation. FINDINGS  Left Ventricle: Left ventricular ejection fraction, by estimation, is 60 to 65%. The left ventricle has normal function. The left ventricle has no regional wall motion abnormalities. The left ventricular internal cavity size was normal in size.    Assessment/Plan:   Principal Problem:   Diabetes mellitus ([place]) Active Problems:   Essential hypertension, benign   Atrial fibrillation ([name])  Acute on chronic diastolic heart failure ([name])   Cellulitis of lower extremity    Patient Summary: Eric [name] is a 79 y.o. who has a pertinent past HTN, Type II DM, persistent atrial fibrillation on Eliquis, and chronic diastolic heart failure, who presented with hypervolemia and admit for acute on chronic HF 2/2 to dietary nonadherence on hospital day 1  Acute on chronic diastolic heart failure - Patient had good diuresis overnight -4.5 L since admission  - Continue lasix 40 mg BID - Strict I&O's  - Daily weights  - Continue spironolactone   Cellulitis: -Patient has warmth and tenderness of the right LE concerning for cellulitis -Keflex 500 mg every 6 hours for 5 days  Chronic Atrial Fibrillation  - Continue diltiazem and  apixaban  DM  -Continue current diabetic management  CKD Stage III -Avoid nephrotoxic agents when possible   Diet: Carb-Modified IVF: None,None VTE: NOAC Code: Full PT/OT recs: Pending TOC recs: none   Dispo: Anticipated discharge pending further evaluation and management .    Marianna Payment, D.O. [facility], PGY-1 Date 02/13/2020 Time 7:36 PM Pager: [phone]

## 2020-02-14 ENCOUNTER — Encounter (HOSPITAL_COMMUNITY): Payer: Medicare Other

## 2020-02-14 DIAGNOSIS — I10 Essential (primary) hypertension: Secondary | ICD-10-CM

## 2020-02-14 DIAGNOSIS — I4891 Unspecified atrial fibrillation: Secondary | ICD-10-CM

## 2020-02-14 LAB — BASIC METABOLIC PANEL
Anion gap: 10 (ref 5–15)
BUN: 67 mg/dL — ABNORMAL HIGH (ref 8–23)
CO2: 21 mmol/L — ABNORMAL LOW (ref 22–32)
Calcium: 8.9 mg/dL (ref 8.9–10.3)
Chloride: 108 mmol/L (ref 98–111)
Creatinine, Ser: 2.37 mg/dL — ABNORMAL HIGH (ref 0.61–1.24)
GFR calc Af Amer: 29 mL/min — ABNORMAL LOW (ref >60)
GFR calc non Af Amer: 25 mL/min — ABNORMAL LOW (ref >60)
Glucose, Bld: 135 mg/dL — ABNORMAL HIGH (ref 70–99)
Potassium: 5.2 mmol/L — ABNORMAL HIGH (ref 3.5–5.1)
Sodium: 139 mmol/L (ref 135–145)

## 2020-02-14 LAB — CBC
HCT: 28.1 % — ABNORMAL LOW (ref 39.0–52.0)
Hemoglobin: 9.4 g/dL — ABNORMAL LOW (ref 13.0–17.0)
MCH: 30.5 pg (ref 26.0–34.0)
MCHC: 33.5 g/dL (ref 30.0–36.0)
MCV: 91.2 fL (ref 80.0–100.0)
Platelets: 78 10*3/uL — ABNORMAL LOW (ref 150–400)
RBC: 3.08 MIL/uL — ABNORMAL LOW (ref 4.22–5.81)
RDW: 15.7 % — ABNORMAL HIGH (ref 11.5–15.5)
WBC: 9.9 10*3/uL (ref 4.0–10.5)
nRBC: 0 % (ref 0.0–0.2)

## 2020-02-14 LAB — GLUCOSE, CAPILLARY
Glucose-Capillary: 204 mg/dL — ABNORMAL HIGH (ref 70–99)
Glucose-Capillary: 334 mg/dL — ABNORMAL HIGH (ref 70–99)
Glucose-Capillary: 240 mg/dL — ABNORMAL HIGH (ref 70–99)

## 2020-02-14 NOTE — Progress Notes (Signed)
I tried paging the attending twice - Dr. Philipp Ovens at [phone] and [phone] regarding the ABI order - no reply and called the nurse also. The vascular lab needs clarification on the order due to patient's conditions.  Oda Cogan, BS, RDMS, RVT

## 2020-02-14 NOTE — Progress Notes (Signed)
HD#2 Subjective:  Overnight Events: No events overnight  Eric [name] was evaluated this morning on rounds.  He was resting comfortably sitting on side of the bed.  States that he feels overall weaker than usual but states that he has a poor functional status at baseline.  He states he is only able to ambulate around the house and short distances.  He states that he continues to have bilateral leg pain with right greater than left.  Otherwise he denies any symptoms at this time.  Objective:  Vital signs in last 24 hours: Vitals:   02/13/20 1147 02/13/20 1421 02/13/20 2026 02/14/20 0326  BP: (!) 136/48 (!) 105/48 (!) 146/50 (!) 138/44  Pulse: 84  70 67  Resp: 17  18 20   Temp: 98 F (36.7 C)  98.4 F (36.9 C) 98.6 F (37 C)  TempSrc: Oral  Oral Oral  SpO2: 98% 99% 98% 99%  Weight:    110.7 kg  Height:       Supplemental O2: RA SpO2: 99 %   Physical Exam:  Physical Exam  Filed Weights   02/12/20 0332 02/12/20 2034 02/14/20 0326  Weight: 122.5 kg 114.4 kg 110.7 kg     Intake/Output Summary (Last 24 hours) at 02/14/2020 1140 Last data filed at 02/14/2020 1100 Gross per 24 hour  Intake 240 ml  Output 1225 ml  Net -985 ml   Net IO Since Admission: -4,825 mL [02/14/20 1140]  Pertinent Labs: CBC Latest Ref Rng & Units 02/14/2020 02/12/2020 12/20/2009  WBC 4.0 - 10.5 K/uL 9.9 5.9 5.6  Hemoglobin 13.0 - 17.0 g/dL 9.4(L) 9.9(L) 10.2(L)  Hematocrit 39.0 - 52.0 % 28.1(L) 29.2(L) 29.0(L)  Platelets 150 - 400 K/uL 78(L) 88(L) 139(L)    CMP Latest Ref Rng & Units 02/14/2020 02/13/2020 02/12/2020  Glucose 70 - 99 mg/dL 135(H) 174(H) 340(H)  BUN 8 - 23 mg/dL 67(H) 62(H) 68(H)  Creatinine 0.61 - 1.24 mg/dL 2.37(H) 1.99(H) 2.16(H)  Sodium 135 - 145 mmol/L 139 139 136  Potassium 3.5 - 5.1 mmol/L 5.2(H) 4.4 4.7  Chloride 98 - 111 mmol/L 108 107 107  CO2 22 - 32 mmol/L 21(L) 23 20(L)  Calcium 8.9 - 10.3 mg/dL 8.9 9.1 9.1  Total Protein 6.5 - 8.1 g/dL - - 6.6  Total Bilirubin 0.3 -  1.2 mg/dL - - 2.0(H)  Alkaline Phos 38 - 126 U/L - - 108  AST 15 - 41 U/L - - 22  ALT 0 - 44 U/L - - 34    Pending Labs: None  Imaging: No results found.  Assessment/Plan:   Principal Problem:   Diabetes mellitus ([place]) Active Problems:   Essential hypertension, benign   Atrial fibrillation ([name])   Acute on chronic diastolic heart failure ([name])   Cellulitis of lower extremity    Patient Summary: Eric [name] is a 79 y.o. who  has a past medical history of Gall bladder disease (2020) and History of right hip replacement., HTN, Type II DM, persistent atrial fibrillation on Eliquis, and chronic diastolic heart failure presented with hypervolemia and admit for acute on chronic heart failure on hospital day 2  Acute on chronic diastolic heart failure - Patient had only 980 cc diuresis yesterday with a total of 4.8 L off since admission. -Patient continues to have significant lower extremity edema that I believe is secondary to venous insufficiency.  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 -We will continue Lasix 40 mg twice daily and   Hyperkalemia: -We  will continue Lasix 40 mg twice daily, this will hopefully improve his potassium. -Continue to monitor with daily BMPs.  Lower extremity edema likely secondary to venous insufficiency -Patient has lower extremity edema bilaterally with warmth, and venous stasis dermatitis. -Continues to have pain and swelling despite ongoing diuresis. -We will do ABIs today.  If that is negative for peripheral artery disease will place Unna boots to help manage his lower extremity edema.  Cellulitis: -Patient has some concern for right lower extremity cellulitis.  Is more likely that this is related to his ongoing venous stasis. -Nevertheless, we will continue Keflex 500 mg every 6 hours. -Patient is on day 2 of 5 days.  Chronic Atrial Fibrillation  - Continue diltiazem and apixaban  DM  -Continue current diabetic  management  CKD Stage III -Avoid nephrotoxic agents when possible   Diet: Heart Healthy IVF: None,None VTE: Heparin Code: Full PT/OT recs: Pending TOC recs: pending   Dispo: Anticipated discharge pending further evaluation and management, like 1-2 days.     Marianna Payment, D.O. [facility], PGY-1 Date 02/14/2020 Time 11:40 AM Pager: [phone]

## 2020-02-14 NOTE — Evaluation (Signed)
Physical Therapy Evaluation Patient Details Name: Eric [name] MRN: [id number] DOB: 06-03-1941 Today's Date: 02/14/2020   History of Present Illness  Eric [name] is a 79 y.o. who  has a past medical history of Gall bladder disease (2020) and History of right hip replacement., HTN, Type II DM, persistent atrial fibrillation on Eliquis, and chronic diastolic heart failure presented with hypervolemia and admit for acute on chronic heart failure on hospital day 2  Clinical Impression  Pt admitted with above diagnosis. Pt was able to ambulate with RW a short distance with min assist and cues for safety.  Should progress well when edema subsides some in LES.   Pt currently with functional limitations due to the deficits listed below (see PT Problem List). Pt will benefit from skilled PT to increase their independence and safety with mobility to allow discharge to the venue listed below.      Follow Up Recommendations No PT follow up;Supervision/Assistance - 24 hour    Equipment Recommendations  None recommended by PT    Recommendations for Other Services       Precautions / Restrictions Precautions Precautions: Fall Restrictions Weight Bearing Restrictions: No      Mobility  Bed Mobility Overal bed mobility: Independent             General bed mobility comments: took incr time but no assist  Transfers Overall transfer level: Needs assistance Equipment used: Rolling walker (2 wheeled) Transfers: Sit to/from Stand Sit to Stand: Min assist;Mod assist;+2 safety/equipment;From elevated surface         General transfer comment: Needed bed elevated and incr assist to power up as well as cues for hand placement  Ambulation/Gait Ambulation/Gait assistance: Min assist Gait Distance (Feet): 20 Feet Assistive device: Rolling walker (2 wheeled) Gait Pattern/deviations: Step-through pattern;Decreased stride length;Trunk flexed   Gait velocity interpretation: <1.31 ft/sec, indicative  of household ambulator General Gait Details: Pt was able to ambulate around bed wtih RW with bil knee instability needing steadyingassist as well as cues to stay close to RW.   Stairs            Wheelchair Mobility    Modified Rankin (Stroke Patients Only)       Balance Overall balance assessment: Needs assistance Sitting-balance support: No upper extremity supported;Feet supported Sitting balance-Leahy Scale: Fair     Standing balance support: Bilateral upper extremity supported;During functional activity Standing balance-Leahy Scale: Poor Standing balance comment: relies on UE support and external support                             Pertinent Vitals/Pain Pain Assessment: No/denies pain    Home Living Family/patient expects to be discharged to:: Private residence Living Arrangements: Spouse/significant other Available Help at Discharge: Family;Available 24 hours/day Type of Home: House Home Access: Ramped entrance     Home Layout: Able to live on main level with bedroom/bathroom;Multi-level Home Equipment: Walker - 2 wheels;Walker - 4 wheels;Cane - single point;Grab bars - toilet;Grab bars - tub/shower;Shower seat - built in;Wheelchair - manual      Prior Function Level of Independence: Needs assistance   Gait / Transfers Assistance Needed: Did not use equipment - independent with walking around house and pt still travels. Still sells firearms   ADL's / Homemaking Assistance Needed: B/D self PTA        Hand Dominance   Dominant Hand: Right    Extremity/Trunk Assessment   Upper Extremity Assessment Upper Extremity  Assessment: Defer to OT evaluation    Lower Extremity Assessment Lower Extremity Assessment: Generalized weakness    Cervical / Trunk Assessment Cervical / Trunk Assessment: Normal  Communication   Communication: No difficulties  Cognition Arousal/Alertness: Awake/alert Behavior During Therapy: WFL for tasks  assessed/performed Overall Cognitive Status: Within Functional Limits for tasks assessed                                        General Comments General comments (skin integrity, edema, etc.): 75 bpm    Exercises General Exercises - Lower Extremity Ankle Circles/Pumps: AROM;Both;10 reps;Seated Long Arc Quad: AROM;Both;10 reps;Seated   Assessment/Plan    PT Assessment Patient needs continued PT services  PT Problem List Decreased activity tolerance;Decreased balance;Decreased mobility;Decreased knowledge of use of DME;Decreased safety awareness;Decreased knowledge of precautions;Cardiopulmonary status limiting activity       PT Treatment Interventions DME instruction;Gait training;Functional mobility training;Therapeutic activities;Therapeutic exercise;Balance training;Patient/family education    PT Goals (Current goals can be found in the Care Plan section)  Acute Rehab PT Goals Patient Stated Goal: to go home PT Goal Formulation: With patient Time For Goal Achievement: 02/28/20 Potential to Achieve Goals: Good    Frequency Min 3X/week   Barriers to discharge        Co-evaluation               AM-PAC PT "6 Clicks" Mobility  Outcome Measure Help needed turning from your back to your side while in a flat bed without using bedrails?: None Help needed moving from lying on your back to sitting on the side of a flat bed without using bedrails?: None Help needed moving to and from a bed to a chair (including a wheelchair)?: A Little Help needed standing up from a chair using your arms (e.g., wheelchair or bedside chair)?: A Lot Help needed to walk in hospital room?: A Lot Help needed climbing 3-5 steps with a railing? : A Lot 6 Click Score: 17    End of Session Equipment Utilized During Treatment: Gait belt Activity Tolerance: Patient limited by fatigue Patient left: in chair;with call bell/phone within reach;with chair alarm set Nurse Communication:  Mobility status PT Visit Diagnosis: Unsteadiness on feet (R26.81);Muscle weakness (generalized) (M62.81)    Time: 5170-0174 PT Time Calculation (min) (ACUTE ONLY): 16 min   Charges:   PT Evaluation $PT Eval Moderate Complexity: 1 Mod          Edris Schneck W,PT Acute Rehabilitation Services Pager:  [phone]  Office:  [place] 02/14/2020, 1:43 PM

## 2020-02-15 ENCOUNTER — Inpatient Hospital Stay (HOSPITAL_COMMUNITY): Payer: Medicare Other

## 2020-02-15 DIAGNOSIS — R609 Edema, unspecified: Secondary | ICD-10-CM

## 2020-02-15 DIAGNOSIS — I872 Venous insufficiency (chronic) (peripheral): Secondary | ICD-10-CM

## 2020-02-15 LAB — BASIC METABOLIC PANEL
Anion gap: 9 (ref 5–15)
BUN: 79 mg/dL — ABNORMAL HIGH (ref 8–23)
CO2: 22 mmol/L (ref 22–32)
Calcium: 8.6 mg/dL — ABNORMAL LOW (ref 8.9–10.3)
Chloride: 105 mmol/L (ref 98–111)
Creatinine, Ser: 2.78 mg/dL — ABNORMAL HIGH (ref 0.61–1.24)
GFR calc Af Amer: 24 mL/min — ABNORMAL LOW (ref >60)
GFR calc non Af Amer: 21 mL/min — ABNORMAL LOW (ref >60)
Glucose, Bld: 159 mg/dL — ABNORMAL HIGH (ref 70–99)
Potassium: 4.5 mmol/L (ref 3.5–5.1)
Sodium: 136 mmol/L (ref 135–145)

## 2020-02-15 LAB — CBC
HCT: 24.6 % — ABNORMAL LOW (ref 39.0–52.0)
Hemoglobin: 8 g/dL — ABNORMAL LOW (ref 13.0–17.0)
MCH: 29.9 pg (ref 26.0–34.0)
MCHC: 32.5 g/dL (ref 30.0–36.0)
MCV: 91.8 fL (ref 80.0–100.0)
Platelets: 73 10*3/uL — ABNORMAL LOW (ref 150–400)
RBC: 2.68 MIL/uL — ABNORMAL LOW (ref 4.22–5.81)
RDW: 15.5 % (ref 11.5–15.5)
WBC: 7.3 10*3/uL (ref 4.0–10.5)
nRBC: 0 % (ref 0.0–0.2)

## 2020-02-15 LAB — GLUCOSE, CAPILLARY
Glucose-Capillary: 156 mg/dL — ABNORMAL HIGH (ref 70–99)
Glucose-Capillary: 219 mg/dL — ABNORMAL HIGH (ref 70–99)
Glucose-Capillary: 186 mg/dL — ABNORMAL HIGH (ref 70–99)
Glucose-Capillary: 166 mg/dL — ABNORMAL HIGH (ref 70–99)

## 2020-02-15 LAB — RETICULOCYTES
Immature Retic Fract: 12.7 % (ref 2.3–15.9)
RBC.: 2.67 MIL/uL — ABNORMAL LOW (ref 4.22–5.81)
Retic Count, Absolute: 73.4 10*3/uL (ref 19.0–186.0)
Retic Ct Pct: 2.8 % (ref 0.4–3.1)

## 2020-02-15 LAB — FERRITIN: Ferritin: 201 ng/mL (ref 24–336)

## 2020-02-15 LAB — TECHNOLOGIST SMEAR REVIEW: Plt Morphology: DECREASED

## 2020-02-15 MED ORDER — CEPHALEXIN 500 MG PO CAPS: 500.0000 mg | ORAL_CAPSULE | Freq: Two times a day (BID) | ORAL | Status: DC

## 2020-02-15 NOTE — Progress Notes (Signed)
Physical Therapy Treatment Patient Details Name: Eric [name] MRN: [id number] DOB: 11-27-1940 Today's Date: 02/15/2020    History of Present Illness Axil Copeman is a 79 y.o. who  has a past medical history of Gall bladder disease (2020) and History of right hip replacement., HTN, Type II DM, persistent atrial fibrillation on Eliquis, and chronic diastolic heart failure presented with hypervolemia and admit for acute on chronic heart failure Admitted 02/12/20    PT Comments    Pt supine in bed on entry with c/o of LE pain R>L due to edema and weeping. Pt reports that plans is for placement of Unna Boots. Pt reports getting up with nursing this morning unable to bear weight due to pain. Pt is agreeable to bed level exercise today. PT anticipates pt will need HHPT at discharge to improve mobility. PT will continue to follow acutely.     Follow Up Recommendations  Supervision/Assistance - 24 hour;Home health PT     Equipment Recommendations  None recommended by PT       Precautions / Restrictions Precautions Precautions: Fall Restrictions Weight Bearing Restrictions: No    Mobility  Bed Mobility               General bed mobility comments: Pt reports trying to get up with nursing this morning and was unable due to R LE pain. Pt request no mobility             Cognition Arousal/Alertness: Awake/alert Behavior During Therapy: WFL for tasks assessed/performed Overall Cognitive Status: Within Functional Limits for tasks assessed                                        Exercises General Exercises - Lower Extremity Ankle Circles/Pumps: AROM;Both;10 reps;Seated(R ankle movement increases pain on able to complete toe move) Quad Sets: AROM;Right;10 reps;Supine Long Arc Quad: AROM;10 reps;Seated;Left Heel Slides: AROM;Left;10 reps;Supine Hip ABduction/ADduction: AROM;Both;10 reps;Supine Straight Leg Raises: AROM;Both;10 reps;Supine    General Comments  VSS on  RA      Pertinent Vitals/Pain Pain Assessment: 0-10 Pain Score: 8  Pain Location: R LE due to edema Pain Descriptors / Indicators: Heaviness;Throbbing;Tightness;Pressure Pain Intervention(s): Limited activity within patient's tolerance;Monitored during session;Repositioned           PT Goals (current goals can now be found in the care plan section) Acute Rehab PT Goals Patient Stated Goal: to go home PT Goal Formulation: With patient Time For Goal Achievement: 02/28/20 Potential to Achieve Goals: Good Progress towards PT goals: Not progressing toward goals - comment(limited by LE pain )    Frequency    Min 3X/week      PT Plan Discharge plan needs to be updated    Co-evaluation              AM-PAC PT "6 Clicks" Mobility   Outcome Measure  Help needed turning from your back to your side while in a flat bed without using bedrails?: None Help needed moving from lying on your back to sitting on the side of a flat bed without using bedrails?: None Help needed moving to and from a bed to a chair (including a wheelchair)?: A Little Help needed standing up from a chair using your arms (e.g., wheelchair or bedside chair)?: A Lot Help needed to walk in hospital room?: A Lot Help needed climbing 3-5 steps with a railing? : A Lot 6 Click Score:  17    End of Session   Activity Tolerance: Patient limited by pain Patient left: with call bell/phone within reach;with chair alarm set;in bed Nurse Communication: Mobility status PT Visit Diagnosis: Unsteadiness on feet (R26.81);Muscle weakness (generalized) (M62.81)     Time: 1610-9604 PT Time Calculation (min) (ACUTE ONLY): 22 min  Charges:  $Therapeutic Exercise: 8-22 mins                     Cherie Lasalle B. Migdalia Dk PT, DPT Acute Rehabilitation Services Pager [phone] Office [phone]    [place] 02/15/2020, 10:43 AM

## 2020-02-15 NOTE — Care Management Important Message (Signed)
Important Message  Patient Details  Name: Eric [name] MRN: [id number] Date of Birth: Aug 21, 1941   Medicare Important Message Given:  Yes     [name] 02/15/2020, 2:42 PM

## 2020-02-15 NOTE — Progress Notes (Signed)
PHARMACY NOTE:  ANTIMICROBIAL RENAL DOSAGE ADJUSTMENT  Current antimicrobial regimen includes a mismatch between antimicrobial dosage and estimated renal function.  As per policy approved by the Pharmacy & Therapeutics and Medical Executive Committees, the antimicrobial dosage will be adjusted accordingly.  Current antimicrobial dosage:  keflex 500mg  po q6h (ends on 02/18/20)  Indication: cellulitis  Renal Function:  Estimated Creatinine Clearance: 28.4 mL/min (A) (by C-G formula based on SCr of 2.78 mg/dL (H)).     Antimicrobial dosage has been changed to:   keflex 500mg  po q12h (ends on 02/18/20)  Additional comments: Please call if any concerns ([phone])   Thank you for allowing pharmacy to be a part of this patient's care.  Hildred Laser, PharmD Clinical Pharmacist **Pharmacist phone directory can now be found on [email or web address] ([email or web address]).  Listed under [place].

## 2020-02-15 NOTE — Progress Notes (Signed)
Results for ALIJA, RIANO (MRN [id number]) as of 02/15/2020 14:23  Ref. Range 02/14/2020 11:40 02/14/2020 17:05 02/14/2020 21:24 02/15/2020 06:58 02/15/2020 11:12  Glucose-Capillary Latest Ref Range: 70 - 99 mg/dL 204 (H) 334 (H) 240 (H) 156 (H) 219 (H)  Noted that postprandial blood sugars continue to be greater 180 mg/dl.  Recommend increasing Novolog meal coverage to 10 units TID to be given if patient eats at least 50% of meal and blood sugars continue to be elevated.  Harvel Ricks RN BSN CDE Diabetes Coordinator Pager: [phone]  8am-5pm

## 2020-02-15 NOTE — Plan of Care (Signed)
  Problem: Education: Goal: Knowledge of General Education information will improve Description: Including pain rating scale, medication(s)/side effects and non-pharmacologic comfort measures Outcome: Progressing   Problem: Nutrition: Goal: Adequate nutrition will be maintained Outcome: Progressing   

## 2020-02-15 NOTE — Care Management (Signed)
Added Heart Failure education with special attention to eating with Heart failure in discharge instructions

## 2020-02-15 NOTE — Plan of Care (Signed)

## 2020-02-15 NOTE — Progress Notes (Signed)
Venous duplex and ABI.       have been completed. Preliminary results can be found under CV proc through chart review. June Leap, BS, RDMS, RVT

## 2020-02-15 NOTE — Progress Notes (Addendum)
HD#3 Subjective:  Overnight Events: none  Eric [name] Patient states that he is feeling a little worse today than he did yesterday. Still reports having pain in his feet and legs. He states that he does have a primary care doctor over at [facility] that he sees, but is thinking about seeking another PCP. Patient asks if the staff can avoid petroleum jelly as it seems to spike his blood sugars into the "500s" All questions and concerns were addressed.  Objective:  Vital signs in last 24 hours: Vitals:   02/13/20 2026 02/14/20 0326 02/14/20 1946 02/15/20 0434  BP: (!) 146/50 (!) 138/44 (!) 126/46 (!) 137/45  Pulse: 70 67 71 65  Resp: 18 20 20 17   Temp: 98.4 F (36.9 C) 98.6 F (37 C) 99 F (37.2 C) 97.8 F (36.6 C)  TempSrc: Oral Oral Oral   SpO2: 98% 99% 100% 97%  Weight:  110.7 kg  112.7 kg  Height:        Physical Exam:   General: awake, alert, lying in bed in NAD MSK: BLE erythematous, edematous, R>L, clear weeping ulcerations. Right calf tender.   Filed Weights   02/12/20 2034 02/14/20 0326 02/15/20 0434  Weight: 114.4 kg 110.7 kg 112.7 kg     Intake/Output Summary (Last 24 hours) at 02/15/2020 0656 Last data filed at 02/14/2020 2000 Gross per 24 hour  Intake 360 ml  Output 400 ml  Net -40 ml   Net IO Since Admission: -4,585 mL [02/15/20 0656]  Pertinent Labs: CBC Latest Ref Rng & Units 02/15/2020 02/14/2020 02/12/2020  WBC 4.0 - 10.5 K/uL 7.3 9.9 5.9  Hemoglobin 13.0 - 17.0 g/dL 8.0(L) 9.4(L) 9.9(L)  Hematocrit 39.0 - 52.0 % 24.6(L) 28.1(L) 29.2(L)  Platelets 150 - 400 K/uL 73(L) 78(L) 88(L)    CMP Latest Ref Rng & Units 02/15/2020 02/14/2020 02/13/2020  Glucose 70 - 99 mg/dL 159(H) 135(H) 174(H)  BUN 8 - 23 mg/dL 79(H) 67(H) 62(H)  Creatinine 0.61 - 1.24 mg/dL 2.78(H) 2.37(H) 1.99(H)  Sodium 135 - 145 mmol/L 136 139 139  Potassium 3.5 - 5.1 mmol/L 4.5 5.2(H) 4.4  Chloride 98 - 111 mmol/L 105 108 107  CO2 22 - 32 mmol/L 22 21(L) 23  Calcium 8.9 - 10.3  mg/dL 8.6(L) 8.9 9.1  Total Protein 6.5 - 8.1 g/dL - - -  Total Bilirubin 0.3 - 1.2 mg/dL - - -  Alkaline Phos 38 - 126 U/L - - -  AST 15 - 41 U/L - - -  ALT 0 - 44 U/L - - -     Assessment/Plan:   Principal Problem:   Diabetes mellitus ([name]) Active Problems:   Essential hypertension, benign   Atrial fibrillation ([name])   Acute on chronic diastolic heart failure ([name])   Cellulitis of lower extremity    Patient Summary:  Eric [name] is a 79 y/o male with history of HTN, type II DM, persistent afib on Eliquis, and chronic diastolic heart failure who presented with volume overload due to HFpEF exacerbation in the setting of dietary indiscretion.   Acute on chronic diastolic heart failure -diuresis has slowed and creatinine has bumped; suspect remainder of fluid is secondary to chronic venous insufficiency -holding diuresis today and will continue to monitor renal function   Chronic venous insufficiency Venous stasis dermatitis  -Continues to have pain and swelling despite adequate diuresis -obtaining ABIs and if normal will place unna boots  -obtaining DVT u/s on right given right calf pain  Cellulitis: -Patient has some concern for right lower extremity cellulitis.  Is more likely that this is related to his ongoing venous stasis. -will stop Keflex 500 today and monitor response -Patient is on day 2 of 5 days.  Acute on chronic thrombocytopenia -patient appears to have a chronic thrombocytopenia of unknown etiology which acutely worsened during hospitalization -medication list reviewed; top 2 possibilities are allopurinol and cephalosporin which was started for possible concurrent lower extremity cellulitis. These have been discontinued  -will have peripheral smear reviewed and continue to trend CBCs off of these medications   Chronic Atrial Fibrillation - Continuediltiazem and apixaban  DM -Continue current diabetic management  CKD Stage III -creatinine  slightly up from baseline likely from diuresis -continue to trend BMPs   Dispo: Anticipated discharge in 1-2 days.    Marianna Payment, D.O. [facility], PGY-1 Date 02/15/2020 Time 6:56 AM Pager: [phone]   Internal Medicine Attending Attestation:   I have seen and evaluated this patient and I have discussed the plan of care with the house staff. Please see their note for complete details.  Oda Kilts, MD

## 2020-02-15 NOTE — Discharge Instructions (Signed)
Heart Failure, Self Care °Heart failure is a serious condition. This sheet explains things you need to do to take care of yourself at home. To help you stay as healthy as possible, you may be asked to change your diet, take certain medicines, and make other changes in your life. Your doctor may also give you more specific instructions. If you have problems or questions, call your doctor. °What are the risks? °Having heart failure makes it more likely for you to have some problems. These problems can get worse if you do not take good care of yourself. Problems may include: °· Blood clotting problems. This may cause a stroke. °· Damage to the kidneys, liver, or lungs. °· Abnormal heart rhythms. °Supplies needed: °· Scale for weighing yourself. °· Blood pressure monitor. °· Notebook. °· Medicines. °How to care for yourself when you have heart failure °Medicines °Take over-the-counter and prescription medicines only as told by your doctor. Take your medicines every day. °· Do not stop taking your medicine unless your doctor tells you to do so. °· Do not skip any medicines. °· Get your prescriptions refilled before you run out of medicine. This is important. °Eating and drinking ° °· Eat heart-healthy foods. Talk with a diet specialist (dietitian) to create an eating plan. °· Choose foods that: °? Have no trans fat. °? Are low in saturated fat and cholesterol. °· Choose healthy foods, such as: °? Fresh or frozen fruits and vegetables. °? Fish. °? Low-fat (lean) meats. °? Legumes, such as beans, peas, and lentils. °? Fat-free or low-fat dairy products. °? Whole-grain foods. °? High-fiber foods. °· Limit salt (sodium) if told by your doctor. Ask your diet specialist to tell you which seasonings are healthy for your heart. °· Cook in healthy ways instead of frying. Healthy ways of cooking include roasting, grilling, broiling, baking, poaching, steaming, and stir-frying. °· Limit how much fluid you drink, if told by your  doctor. °Alcohol use °· Do not drink alcohol if: °? Your doctor tells you not to drink. °? Your heart was damaged by alcohol, or you have very bad heart failure. °? You are pregnant, may be pregnant, or are planning to become pregnant. °· If you drink alcohol: °? Limit how much you use to: °§ 0-1 drink a day for women. °§ 0-2 drinks a day for men. °? Be aware of how much alcohol is in your drink. In the [place] one drink equals one 12 oz bottle of beer (355 mL), one 5 oz glass of wine (148 mL), or one 1½ oz glass of hard liquor (44 mL). °Lifestyle ° °· Do not use any products that contain nicotine or tobacco, such as cigarettes, e-cigarettes, and chewing tobacco. If you need help quitting, ask your doctor. °? Do not use nicotine gum or patches before talking to your doctor. °· Do not use illegal drugs. °· Lose weight if told by your doctor. °· Do physical activity if told by your doctor. Talk to your doctor before you begin an exercise if: °? You are an older adult. °? You have very bad heart failure. °· Learn to manage stress. If you need help, ask your doctor. °· Get rehab (rehabilitation) to help you stay independent and to help with your quality of life. °· Plan time to rest when you get tired. °Check weight and blood pressure ° °· Weigh yourself every day. This will help you to know if fluid is building up in your body. °? Weigh yourself every morning   after you pee (urinate) and before you eat breakfast. ? Wear the same amount of clothing each time. ? Write down your daily weight. Give your record to your doctor.  Check and write down your blood pressure as told by your doctor.  Check your pulse as told by your doctor. Dealing with very hot and very cold weather  If it is very hot: ? Avoid activities that take a lot of energy. ? Use air conditioning or fans, or find a cooler place. ? Avoid caffeine and alcohol. ? Wear clothing that is loose-fitting, lightweight, and light-colored.  If it is very  cold: ? Avoid activities that take a lot of energy. ? Layer your clothes. ? Wear mittens or gloves, a hat, and a scarf when you go outside. ? Avoid alcohol. Follow these instructions at home:  Stay up to date with shots (vaccines). Get pneumococcal and flu (influenza) shots.  Keep all follow-up visits as told by your doctor. This is important. Contact a doctor if:  You gain weight quickly.  You have increasing shortness of breath.  You cannot do your normal activities.  You get tired easily.  You cough a lot.  You don't feel like eating or feel like you may vomit (nauseous).  You become puffy (swell) in your hands, feet, ankles, or belly (abdomen).  You cannot sleep well because it is hard to breathe.  You feel like your heart is beating fast (palpitations).  You get dizzy when you stand up. Get help right away if:  You have trouble breathing.  You or someone else notices a change in your behavior, such as having trouble staying awake.  You have chest pain or discomfort.  You pass out (faint). These symptoms may be an emergency. Do not wait to see if the symptoms will go away. Get medical help right away. Call your local emergency services (911 in the [place] Do not drive yourself to the hospital. Summary  Heart failure is a serious condition. To care for yourself, you may have to change your diet, take medicines, and make other lifestyle changes.  Take your medicines every day. Do not stop taking them unless your doctor tells you to do so.  Eat heart-healthy foods, such as fresh or frozen fruits and vegetables, fish, lean meats, legumes, fat-free or low-fat dairy products, and whole-grain or high-fiber foods.  Ask your doctor if you can drink alcohol. You may have to stop alcohol use if you have very bad heart failure.  Contact your doctor if you gain weight quickly or feel that your heart is beating too fast. Get help right away if you pass out, or have chest pain  or trouble breathing. This information is not intended to replace advice given to you by your health care provider. Make sure you discuss any questions you have with your health care provider. Document Revised: 12/20/2018 Document Reviewed: 12/21/2018 Elsevier Patient Education  [place].   Heart Failure Eating Plan Heart failure, also called congestive heart failure, occurs when your heart does not pump blood well enough to meet your body's needs for oxygen-rich blood. Heart failure is a long-term (chronic) condition. Living with heart failure can be challenging. However, following your health care provider's instructions about a healthy lifestyle and working with a diet and nutrition specialist (dietitian) to choose the right foods may help to improve your symptoms. What are tips for following this plan? Reading food labels  Check food labels for the amount of sodium per  serving. Choose foods that have less than 140 mg (milligrams) of sodium in each serving.  Check food labels for the number of calories per serving. This is important if you need to limit your daily calorie intake to lose weight.  Check food labels for the serving size. If you eat more than one serving, you will be eating more sodium and calories than what is listed on the label.  Look for foods that are labeled as "sodium-free," "very low sodium," or "low sodium." ? Foods labeled as "reduced sodium" or "lightly salted" may still have more sodium than what is recommended for you. Cooking  Avoid adding salt when cooking. Ask your health care provider or dietitian before using salt substitutes.  Season food with salt-free seasonings, spices, or herbs. Check the label of seasoning mixes to make sure they do not contain salt.  Cook with heart-healthy oils, such as olive, canola, soybean, or sunflower oil.  Do not fry foods. Cook foods using low-fat methods, such as baking, boiling, grilling, and broiling.  Limit  unhealthy fats when cooking by: ? Removing the skin from poultry, such as chicken. ? Removing all visible fats from meats. ? Skimming the fat off from stews, soups, and gravies before serving them. Meal planning   Limit your intake of: ? Processed, canned, or pre-packaged foods. ? Foods that are high in trans fat, such as fried foods. ? Sweets, desserts, sugary drinks, and other foods with added sugar. ? Full-fat dairy products, such as whole milk.  Eat a balanced diet that includes: ? 4-5 servings of fruit each day and 4-5 servings of vegetables each day. At each meal, try to fill half of your plate with fruits and vegetables. ? Up to 6-8 servings of whole grains each day. ? Up to 2 servings of lean meat, poultry, or fish each day. One serving of meat is equal to 3 oz. This is about the same size as a deck of cards. ? 2 servings of low-fat dairy each day. ? Heart-healthy fats. Healthy fats called omega-3 fatty acids are found in foods such as flaxseed and cold-water fish like sardines, salmon, and mackerel.  Aim to eat 25-35 g (grams) of fiber a day. Foods that are high in fiber include apples, broccoli, carrots, beans, peas, and whole grains.  Do not add salt or condiments that contain salt (such as soy sauce) to foods before eating.  When eating at a restaurant, ask that your food be prepared with less salt or no salt, if possible.  Try to eat 2 or more vegetarian meals each week.  Eat more home-cooked food and eat less restaurant, buffet, and fast food. General information  Do not eat more than 2,300 mg of salt (sodium) a day. The amount of sodium that is recommended for you may be lower, depending on your condition.  Maintain a healthy body weight as directed. Ask your health care provider what a healthy weight is for you. ? Check your weight every day. ? Work with your health care provider and dietitian to make a plan that is right for you to lose weight or maintain your  current weight.  Limit how much fluid you drink. Ask your health care provider or dietitian how much fluid you can have each day.  Limit or avoid alcohol as told by your health care provider or dietitian. Recommended foods The items listed may not be a complete list. Talk with your dietitian about what dietary choices are best for you.  Fruits All fresh, frozen, and canned fruits. Dried fruits, such as raisins, prunes, and cranberries. Vegetables All fresh vegetables. Vegetables that are frozen without sauce or added salt. Low-sodium or sodium-free canned vegetables. Grains Bread with less than 80 mg of sodium per slice. Whole-wheat pasta, quinoa, and brown rice. Oats and oatmeal. Barley. [place]. Grits and cream of wheat. Whole-grain and whole-wheat cold cereal. Meats and other protein foods Lean cuts of meat. Skinless chicken and [place]. Fish with high omega-3 fatty acids, such as salmon, sardines, and other cold-water fishes. Eggs. Dried beans, peas, and edamame. Unsalted nuts and nut butters. Dairy Low-fat or nonfat (skim) milk and dried milk. Rice milk, soy milk, and almond milk. Low-fat or nonfat yogurt. Small amounts of reduced-sodium block cheese. Low-sodium cottage cheese. Fats and oils Olive, canola, soybean, flaxseed, or sunflower oil. Avocado. Sweets and desserts Apple sauce. Granola bars. Sugar-free pudding and gelatin. Frozen fruit bars. Seasoning and other foods Fresh and dried herbs. Lemon or lime juice. Vinegar. Low-sodium ketchup. Salt-free marinades, salad dressings, sauces, and seasonings. The items listed above may not be a complete list of foods and beverages you can eat. Contact a dietitian for more information. Foods to avoid The items listed may not be a complete list. Talk with your dietitian about what dietary choices are best for you. Fruits Fruits that are dried with sodium-containing preservatives. Vegetables Canned vegetables. Frozen vegetables with sauce or  seasonings. Creamed vegetables. [place] fries. Onion rings. Pickled vegetables and sauerkraut. Grains Bread with more than 80 mg of sodium per slice. Hot or cold cereal with more than 140 mg sodium per serving. Salted pretzels and crackers. Pre-packaged breadcrumbs. Bagels, croissants, and biscuits. Meats and other protein foods Ribs and chicken wings. Bacon, ham, pepperoni, bologna, salami, and packaged luncheon meats. Hot dogs, bratwurst, and sausage. Canned meat. Smoked meat and fish. Salted nuts and seeds. Dairy Whole milk, half-and-half, and cream. Buttermilk. Processed cheese, cheese spreads, and cheese curds. Regular cottage cheese. Feta cheese. Shredded cheese. String cheese. Fats and oils Butter, lard, shortening, ghee, and bacon fat. Canned and packaged gravies. Seasoning and other foods Onion salt, garlic salt, table salt, and sea salt. Marinades. Regular salad dressings. Relishes, pickles, and olives. Meat flavorings and tenderizers, and bouillon cubes. Horseradish, ketchup, and mustard. Worcestershire sauce. Teriyaki sauce, soy sauce (including reduced sodium). Hot sauce and Tabasco sauce. Steak sauce, fish sauce, oyster sauce, and cocktail sauce. Taco seasonings. Barbecue sauce. Tartar sauce. The items listed above may not be a complete list of foods and beverages you should avoid. Contact a dietitian for more information. Summary  A heart failure eating plan includes changes that limit your intake of sodium and unhealthy fat, and it may help you lose weight or maintain a healthy weight. Your health care provider may also recommend limiting how much fluid you drink.  Most people with heart failure should eat no more than 2,300 mg of salt (sodium) a day. The amount of sodium that is recommended for you may be lower, depending on your condition.  Contact your health care provider or dietitian before making any major changes to your diet. This information is not intended to replace  advice given to you by your health care provider. Make sure you discuss any questions you have with your health care provider. Document Revised: 11/03/2018 Document Reviewed: 01/22/2017 Elsevier Patient Education  [place] With Heart Failure  Heart failure is a long-term (chronic) condition in which the heart cannot pump enough blood through  the body. When this happens, parts of the body do not get the blood and oxygen they need. There is no cure for heart failure at this time, so it is important for you to take good care of yourself and follow the treatment plan set by your health care provider. If you are living with heart failure, there are ways to help you manage the disease. Follow these instructions at home: Living with heart failure requires you to make changes in your life. Your health care team will teach you about the changes you need to make in order to relieve your symptoms and lower your risk of going to the hospital. Follow the treatment plan as set by your health care provider. Medicines Medicines are important in reducing your heart's workload, slowing the progression of heart failure, and improving your symptoms.  Take over-the-counter and prescription medicines only as told by your health care provider.  Do not stop taking your medicine unless your health care provider tells you to do that.  Do not skip any dose of your medicine.  Refill prescriptions before you run out of medicine. You need your medicines every day. Eating and drinking   Eat heart-healthy foods. Talk with a dietitian to make an eating plan that is right for you. ? If directed by your health care provider:  Limit salt (sodium). Lowering your sodium intake may reduce symptoms of heart failure. Ask a dietitian to recommend heart-healthy seasonings.  Limit your fluid intake. Fluid restriction may reduce symptoms of heart failure. ? Use low-fat cooking methods instead of frying. Low-fat  methods include roasting, grilling, broiling, baking, poaching, steaming, and stir-frying. ? Choose foods that contain no trans fat and are low in saturated fat and cholesterol. Healthy choices include fresh or frozen fruits and vegetables, fish, lean meats, legumes, fat-free or low-fat dairy products, and whole-grain or high-fiber foods.  Limit alcohol intake to no more than 1 drink a day for nonpregnant women and 2 drinks a day for men. One drink equals 12 oz of beer, 5 oz of wine, or 1 oz of hard liquor. ? Drinking more than that is harmful to your heart. Tell your health care provider if you drink alcohol several times a week. ? Talk with your health care provider about whether any level of alcohol use is safe for you. Activity   Ask your health care provider about attending cardiac rehabilitation. These programs include aerobic physical activity, which provides many benefits for your heart.  If no cardiac rehabilitation program is available, ask your health care provider what aerobic exercises are safe for you to do. Lifestyle Make the lifestyle changes recommended by your health care provider. In general:  Lose weight if your health care provider tells you to do that. Weight loss may reduce symptoms of heart failure.  Do not use any products that contain nicotine or tobacco, such as cigarettes or e-cigarettes. If you need help quitting, ask your health care provider.  Do not use street (illegal) drugs.  Return to your normal activities as told by your health care provider. Ask your health care provider what activities are safe for you. General instructions   Make sure you weigh yourself every day to track your weight. Rapid weight gain may indicate an increase in fluid in your body and may increase the workload of your heart. ? Weigh yourself every morning. Do this after you urinate but before you eat breakfast. ? Wear the same type of clothing, without shoes, each time  you weigh  yourself. ? Weigh yourself on the same scale and in the same spot each time.  Living with chronic heart failure often leads to emotions such as fear, stress, anxiety, and depression. If you feel any of these emotions and need help coping, contact your health care provider. Other ways to get help include: ? Talking to friends and family members about your condition. They can give you support and guidance. Explain your symptoms to them and, if comfortable, invite them to attend appointments or rehabilitation with you. ? Joining a support group for people with chronic heart failure. Talking with other people who have the same symptoms may give you new ways of coping with your disease and your emotions.  Stay up to date with your shots (vaccines). Staying current on pneumococcal and influenza vaccines is especially important in preventing germs from attacking your airways (respiratory infections).  Keep all follow-up visits as told by your health care provider. This is important. How to recognize changes in your condition You and your family members need to know what changes to watch for in your condition. Watch for the following changes and report them to your health care provider:  Sudden weight gain. Ask your health care provider what amount of weight gain to report.  Shortness of breath: ? Feeling short of breath while at rest, with no exercise or activity that required great effort. ? Feeling breathless with activity.  Swelling of your lower legs or ankles.  Difficulty sleeping: ? You wake up feeling short of breath. ? You have to use more pillows to raise your head in order to sleep.  Frequent, dry, hacking cough.  Loss of appetite.  Feeling more tired all the time.  Depression or feelings of sadness or hopelessness.  Bloating in the stomach. Where to find more information  Local support groups. Ask your health care provider about groups near you.  The American Heart  Association: www.heart.org Contact a health care provider if:  You have a rapid weight gain.  You have increasing shortness of breath that is unusual for you.  You are unable to participate in your usual physical activities.  You tire easily.  You cough more than normal, especially with physical activity.  You have any swelling or more swelling in areas such as your hands, feet, ankles, or abdomen.  You feel like your heart is beating quickly (palpitations).  You become dizzy or light-headed when you stand up. Get help right away if:  You have difficulty breathing.  You notice or your family notices a change in your awareness, such as having trouble staying awake or having difficulty with concentration.  You have pain or discomfort in your chest.  You have an episode of fainting (syncope). Summary  There is no cure for heart failure, so it is important for you to take good care of yourself and follow the treatment plan set by your health care provider.  Medicines are important in reducing your heart's workload, slowing the progression of heart failure, and improving your symptoms.  Living with chronic heart failure often leads to emotions such as fear, stress, anxiety, and depression. If you are feeling any of these emotions and need help coping, contact your health care provider. This information is not intended to replace advice given to you by your health care provider. Make sure you discuss any questions you have with your health care provider. Document Revised: 08/20/2017 Document Reviewed: 01/20/2017 Elsevier Patient Education  2020 [employer].

## 2020-02-16 LAB — GLUCOSE, CAPILLARY
Glucose-Capillary: 300 mg/dL — ABNORMAL HIGH (ref 70–99)
Glucose-Capillary: 282 mg/dL — ABNORMAL HIGH (ref 70–99)
Glucose-Capillary: 138 mg/dL — ABNORMAL HIGH (ref 70–99)
Glucose-Capillary: 110 mg/dL — ABNORMAL HIGH (ref 70–99)

## 2020-02-16 LAB — BASIC METABOLIC PANEL
Anion gap: 10 (ref 5–15)
BUN: 85 mg/dL — ABNORMAL HIGH (ref 8–23)
CO2: 24 mmol/L (ref 22–32)
Calcium: 8.6 mg/dL — ABNORMAL LOW (ref 8.9–10.3)
Chloride: 104 mmol/L (ref 98–111)
Creatinine, Ser: 2.8 mg/dL — ABNORMAL HIGH (ref 0.61–1.24)
GFR calc Af Amer: 24 mL/min — ABNORMAL LOW (ref >60)
GFR calc non Af Amer: 21 mL/min — ABNORMAL LOW (ref >60)
Glucose, Bld: 196 mg/dL — ABNORMAL HIGH (ref 70–99)
Potassium: 5.1 mmol/L (ref 3.5–5.1)
Sodium: 138 mmol/L (ref 135–145)

## 2020-02-16 LAB — CBC
HCT: 23.7 % — ABNORMAL LOW (ref 39.0–52.0)
Hemoglobin: 7.8 g/dL — ABNORMAL LOW (ref 13.0–17.0)
MCH: 30.7 pg (ref 26.0–34.0)
MCHC: 32.9 g/dL (ref 30.0–36.0)
MCV: 93.3 fL (ref 80.0–100.0)
Platelets: 79 10*3/uL — ABNORMAL LOW (ref 150–400)
RBC: 2.54 MIL/uL — ABNORMAL LOW (ref 4.22–5.81)
RDW: 15.3 % (ref 11.5–15.5)
WBC: 7 10*3/uL (ref 4.0–10.5)
nRBC: 0 % (ref 0.0–0.2)

## 2020-02-16 LAB — LACTATE DEHYDROGENASE: LDH: 149 U/L (ref 98–192)

## 2020-02-16 NOTE — Progress Notes (Signed)
Orthopedic Tech Progress Note Patient Details:  Quadarius Henton Jan 18, 1941 [id number]  Ortho Devices Type of Ortho Device: Haematologist Ortho Device/Splint Location: BLE Ortho Device/Splint Interventions: Ordered, Application   Post Interventions Patient Tolerated: Well Instructions Provided: Adjustment of device, Care of device   Whittley Carandang 02/16/2020, 3:50 PM

## 2020-02-16 NOTE — NC FL2 (Signed)
[place] LEVEL OF CARE SCREENING TOOL     IDENTIFICATION  Patient Name: Eric [name] Birthdate: 1941-08-24 Sex: male Admission Date (Current Location): 02/12/2020  [facility] and [place] Number:  Herbalist and Address:  Helene Kelp)      Provider Number: [id number]  Attending Physician Name and Address:  Oda Kilts, MD  Relative Name and Phone Number:  Center    Current Level of Care: Hospital Recommended Level of Care: [place] Prior Approval Number:    Date Approved/Denied:   PASRR Number: [id number]  Discharge Plan: SNF    Current Diagnoses: Patient Active Problem List   Diagnosis Date Noted  . Cellulitis of lower extremity   . Acute on chronic diastolic heart failure ([place]) 02/12/2020  . DIASTOLIC HEART FAILURE, CHRONIC 02/07/2009  . Diabetes mellitus ([place]) 02/06/2009  . OBESITY 02/06/2009  . Essential hypertension, benign 02/06/2009  . Atrial fibrillation ([place]) 01/15/2009    Orientation RESPIRATION BLADDER Height & Weight     Time, Situation, Place  Normal Continent Weight: 110.4 kg Height:  6' (182.9 cm)  BEHAVIORAL SYMPTOMS/MOOD NEUROLOGICAL BOWEL NUTRITION STATUS      Continent Diet(Diet Carb Modified Fluid consistency: Thin;)  AMBULATORY STATUS COMMUNICATION OF NEEDS Skin   Supervision Verbally Other (Comment)(LE pain R>L due to edema and weeping. Plan is for placement of Retail buyer.)                       Personal Care Assistance Level of Assistance  Bathing, Feeding, Dressing, Total care Bathing Assistance: Limited assistance Feeding assistance: Independent Dressing Assistance: Limited assistance Total Care Assistance: Limited assistance   Functional Limitations Info  Sight, Hearing, Speech Sight Info: Adequate Hearing Info: Adequate Speech Info: Adequate    SPECIAL CARE FACTORS FREQUENCY  PT (By licensed PT), OT (By licensed OT)     PT Frequency: 5 x week OT  Frequency: 5 x week            Contractures Contractures Info: Not present    Additional Factors Info  Code Status, Allergies, Psychotropic, Insulin Sliding Scale Code Status Info: Full Code Allergies Info: Hydrocodone, Lohexol, Morphine adn Related, Sulfa Antibiotics, Sulfonamide Derivatives. Psychotropic Info: none Insulin Sliding Scale Info: insulin glargine (LANTUS) injection 40 Units  :  Dose 40 Units  Subcutaneous  Daily, insulin aspart (novoLOG) injection 8 Units  Subcutaneous 3 times daily with meals, insulin aspart (novoLOG) injection 0-5 Units  :  Dose 0-5 Units Subcutaneous  Daily at bedtime       Current Medications (02/16/2020):  This is the current hospital active medication list Current Facility-Administered Medications  Medication Dose Route Frequency Provider Last Rate Last Admin  . 0.9 %  sodium chloride infusion  250 mL Intravenous PRN Ina Homes, MD      . acetaminophen (TYLENOL) tablet 650 mg  650 mg Oral Q4H PRN Ina Homes, MD   650 mg at 02/14/20 2041  . acidophilus (RISAQUAD) capsule 1 capsule  1 capsule Oral Daily Velna Ochs, MD   1 capsule at 02/16/20 0907  . apixaban (ELIQUIS) tablet 5 mg  5 mg Oral BID Ina Homes, MD   5 mg at 02/16/20 0908  . cholecalciferol (VITAMIN D3) tablet 2,000 Units  2,000 Units Oral Daily Ina Homes, MD   2,000 Units at 02/16/20 0908  . diltiazem (CARDIZEM CD) 24 hr capsule 360 mg  360 mg Oral Daily Ina Homes, MD   360 mg at 02/16/20 0907  .  ferrous sulfate tablet 325 mg  325 mg Oral Q breakfast Ina Homes, MD   325 mg at 02/16/20 0908  . insulin aspart (novoLOG) injection 0-15 Units  0-15 Units Subcutaneous TID WC Ina Homes, MD   8 Units at 02/16/20 1137  . insulin aspart (novoLOG) injection 0-5 Units  0-5 Units Subcutaneous QHS Ina Homes, MD   2 Units at 02/14/20 2150  . insulin aspart (novoLOG) injection 8 Units  8 Units Subcutaneous TID WC Ina Homes, MD   8 Units at 02/16/20  1137  . insulin glargine (LANTUS) injection 40 Units  40 Units Subcutaneous Daily Ina Homes, MD   40 Units at 02/16/20 0908  . levothyroxine (SYNTHROID) tablet 100 mcg  100 mcg Oral Once per day on Mon Tue Wed Thu Fri Sat Velna Ochs, MD   100 mcg at 02/16/20 7078   And  . [START ON 02/18/2020] levothyroxine (SYNTHROID) tablet 50 mcg  50 mcg Oral Once per day on Sun Guilloud, Carolyn, MD      . ondansetron [facility]) injection 4 mg  4 mg Intravenous Q6H PRN Ina Homes, MD      . oxyCODONE (Oxy IR/ROXICODONE) immediate release tablet 10 mg  10 mg Oral Q4H PRN [place], Carley D, DO   10 mg at 02/16/20 0205  . silodosin (RAPAFLO) capsule 8 mg  8 mg Oral Q breakfast Velna Ochs, MD   8 mg at 02/16/20 0908  . simvastatin (ZOCOR) tablet 20 mg  20 mg Oral QHS Ina Homes, MD   20 mg at 02/15/20 2246  . sodium chloride flush (NS) 0.9 % injection 3 mL  3 mL Intravenous Q12H Ina Homes, MD   3 mL at 02/16/20 0910  . sodium chloride flush (NS) 0.9 % injection 3 mL  3 mL Intravenous PRN Ina Homes, MD         Discharge Medications: Please see discharge summary for a list of discharge medications.  Relevant Imaging Results:  Relevant Lab Results:   Additional Information Social Security #: [id number]  Bethena Roys, RN

## 2020-02-16 NOTE — Plan of Care (Signed)

## 2020-02-16 NOTE — TOC Progression Note (Signed)
Transition of Care [facility]) - Progression Note    Patient Details  Name: Eric [name] MRN: [id number] Date of Birth: 08-12-1941  Transition of Care [facility]) CM/SW Contact  Graves-Bigelow, Ocie Cornfield, RN Phone Number: 02/16/2020, 3:06 PM  Clinical Narrative:  Case Manager called [facility] to initiate authorization for skilled nursing facility (SNF)- Reference # is [id number]. Case Manager faxed clinicals to [facility] at [phone]. Will await to hear back from [place] with authorization.   Expected Discharge Plan: [place] Barriers to Discharge: No Barriers Identified  Expected Discharge Plan and Services Expected Discharge Plan: [place] In-house Referral: NA Discharge Planning Services: CM Consult   Living arrangements for the past 2 months: Single Family Home                 DME Arranged: N/A HH Agency: NA   Readmission Risk Interventions No flowsheet data found.

## 2020-02-16 NOTE — Progress Notes (Signed)
Physical Therapy Treatment Patient Details Name: Prithvi Kooi MRN: [id number] DOB: Sep 27, 1940 Today's Date: 02/16/2020    History of Present Illness Jahkari Maclin is a 79 y.o. who  has a past medical history of Gall bladder disease (2020) and History of right hip replacement., HTN, Type II DM, persistent atrial fibrillation on Eliquis, and chronic diastolic heart failure presented with hypervolemia and admit for acute on chronic heart failure Admitted 02/12/20    PT Comments    Pt was seen for mobility on RW, standing with short stepping effort and then to strengthen legs in sitting.  Pain on RLE is a limitation for all effort, but pt is very interested in getting home to feel better and continue recovery.  Follow acutely for these needs, and monitor pain management of RLE.   Follow Up Recommendations  Home health PT;Supervision/Assistance - 24 hour     Equipment Recommendations  None recommended by PT    Recommendations for Other Services       Precautions / Restrictions Precautions Precautions: Fall Restrictions Weight Bearing Restrictions: No    Mobility  Bed Mobility               General bed mobility comments: in chair when PT arrived  Transfers Overall transfer level: Needs assistance Equipment used: Rolling walker (2 wheeled) Transfers: Sit to/from Stand Sit to Stand: Min assist;Mod assist;From elevated surface         General transfer comment: hand placement cued  Ambulation/Gait Ambulation/Gait assistance: Min assist Gait Distance (Feet): 5 Feet Assistive device: Rolling walker (2 wheeled) Gait Pattern/deviations: Step-to pattern   Gait velocity interpretation: <1.31 ft/sec, indicative of household Metallurgist Rankin (Stroke Patients Only)       Balance Overall balance assessment: Needs assistance Sitting-balance support: Feet supported Sitting balance-Leahy Scale: Good      Standing balance support: Bilateral upper extremity supported;During functional activity Standing balance-Leahy Scale: Fair Standing balance comment: fair once up                             Cognition Arousal/Alertness: Awake/alert Behavior During Therapy: WFL for tasks assessed/performed Overall Cognitive Status: Within Functional Limits for tasks assessed                                        Exercises General Exercises - Lower Extremity Ankle Circles/Pumps: AAROM;5 reps Quad Sets: AROM;10 reps Gluteal Sets: AROM;10 reps Long Arc Quad: AROM;Strengthening;10 reps Heel Slides: Strengthening;10 reps Hip ABduction/ADduction: Strengthening;10 reps    General Comments        Pertinent Vitals/Pain Pain Score: 6  Pain Location: R LE due to edema Pain Intervention(s): Monitored during session;Premedicated before session;Repositioned    Home Living                      Prior Function            PT Goals (current goals can now be found in the care plan section)      Frequency    Min 3X/week      PT Plan Current plan remains appropriate    Co-evaluation              AM-PAC PT "6 Clicks" Mobility  Outcome Measure  Help needed turning from your back to your side while in a flat bed without using bedrails?: None Help needed moving from lying on your back to sitting on the side of a flat bed without using bedrails?: None Help needed moving to and from a bed to a chair (including a wheelchair)?: A Little Help needed standing up from a chair using your arms (e.g., wheelchair or bedside chair)?: A Little Help needed to walk in hospital room?: A Lot Help needed climbing 3-5 steps with a railing? : A Lot 6 Click Score: 18    End of Session Equipment Utilized During Treatment: Gait belt Activity Tolerance: Patient limited by pain Patient left: with call bell/phone within reach;with chair alarm set;in bed Nurse Communication:  Mobility status PT Visit Diagnosis: Unsteadiness on feet (R26.81);Muscle weakness (generalized) (M62.81)     Time: 3159-4585 PT Time Calculation (min) (ACUTE ONLY): 34 min  Charges:  $Therapeutic Exercise: 8-22 mins $Therapeutic Activity: 8-22 mins                 Ramond Dial 02/16/2020, 11:50 PM  Mee Hives, PT MS Acute Rehab Dept. Number: [employer]

## 2020-02-16 NOTE — Progress Notes (Addendum)
HD#4 Subjective:  Overnight Events: none  Eric [name] states that he is doing better than yesterday. He states he was able to walk, with assistance, yesterday to use the restroom. He states that his legs look like "two dead fish" on appearance today, and still appears to be swollen. We discussed leg wraps today to see if it alleviates the swelling. Patient amendable to plan.   Objective:  Vital signs in last 24 hours: Vitals:   02/15/20 0824 02/15/20 1654 02/15/20 2100 02/16/20 0525  BP: (!) 119/49  (!) 145/63 (!) 130/55  Pulse:  65 (!) 51 63  Resp:   18 18  Temp:  98.4 F (36.9 C) 98.1 F (36.7 C) 99.1 F (37.3 C)  TempSrc:  Oral Oral   SpO2:  98% 99% 100%  Weight:      Height:       Supplemental O2:  ra SpO2: 100 %   Physical Exam:  Physical Exam  Constitutional: He is oriented to person, place, and time and well-developed, well-nourished, and in no distress.  HENT:  Head: Normocephalic and atraumatic.  Eyes: EOM are normal.  Neck: No tracheal deviation present.  Cardiovascular: Normal rate and intact distal pulses. Exam reveals no gallop and no friction rub.  No murmur heard. Pulmonary/Chest: Effort normal and breath sounds normal. No respiratory distress.  Abdominal: Soft. Bowel sounds are normal. He exhibits no distension. There is no abdominal tenderness.  Musculoskeletal:        General: Tenderness (bilateral LE) and edema (Bilateral, weeping) present. Normal range of motion.  Neurological: He is alert and oriented to person, place, and time.  Skin: Skin is warm and dry.    Filed Weights   02/12/20 2034 02/14/20 0326 02/15/20 0434  Weight: 114.4 kg 110.7 kg 112.7 kg     Intake/Output Summary (Last 24 hours) at 02/16/2020 0542 Last data filed at 02/16/2020 0520 Gross per 24 hour  Intake 400 ml  Output 500 ml  Net -100 ml   Net IO Since Admission: -4,885 mL [02/16/20 0542]  Pertinent Labs: CBC Latest Ref Rng & Units 02/15/2020 02/14/2020 02/12/2020    WBC 4.0 - 10.5 K/uL 7.3 9.9 5.9  Hemoglobin 13.0 - 17.0 g/dL 8.0(L) 9.4(L) 9.9(L)  Hematocrit 39.0 - 52.0 % 24.6(L) 28.1(L) 29.2(L)  Platelets 150 - 400 K/uL 73(L) 78(L) 88(L)    CMP Latest Ref Rng & Units 02/15/2020 02/14/2020 02/13/2020  Glucose 70 - 99 mg/dL 159(H) 135(H) 174(H)  BUN 8 - 23 mg/dL 79(H) 67(H) 62(H)  Creatinine 0.61 - 1.24 mg/dL 2.78(H) 2.37(H) 1.99(H)  Sodium 135 - 145 mmol/L 136 139 139  Potassium 3.5 - 5.1 mmol/L 4.5 5.2(H) 4.4  Chloride 98 - 111 mmol/L 105 108 107  CO2 22 - 32 mmol/L 22 21(L) 23  Calcium 8.9 - 10.3 mg/dL 8.6(L) 8.9 9.1  Total Protein 6.5 - 8.1 g/dL - - -  Total Bilirubin 0.3 - 1.2 mg/dL - - -  Alkaline Phos 38 - 126 U/L - - -  AST 15 - 41 U/L - - -  ALT 0 - 44 U/L - - -     Pending Labs:  Imaging: VAS [place] ABI WITH/WO TBI Summary:  Normal pedal artery waveforms. Unable to obtain blood pressures for Index due to weeping skin/ patient pain level. Normal Right TBI, Abnormal Left TBI.  *See table(s) above for measurements and observations.  Electronically signed by Servando Snare MD on 02/15/2020 at 3:33:54 PM.   Final  Vas [place] Lower Extremity: RIGHT: - There is no evidence of deep vein thrombosis in the lower extremity. However, portions of this examination were limited- see technologist comments above.  - No cystic structure found in the popliteal fossa. - Ultrasound characteristics of enlarged lymph nodes are noted in the groin   Assessment/Plan:   Principal Problem:   Diabetes mellitus ([place]) Active Problems:   Essential hypertension, benign   Atrial fibrillation ([name])   Acute on chronic diastolic heart failure ([name])   Cellulitis of lower extremity    Patient Summary: Eric [name] is a 79 y.o. with a pertinent PMH of HTN, type II DM, persistent afib on Eliquis, chronic diastolic heart failure, presented with volume overload due to HFpEF exacerbation in the setting of dietary indiscretion on hospital day 4  Acute on chronic diastolic  heart failure -Diuresis discontinued overnight we will continue to monitor fluid status to determine need to restart Lasix tomorrow. -Patient does appear to be euvolemic and asymptomatic but continues to have bilateral lower extremity edema. -Lower extremity edema appears to be due to venous insufficiency -We will stop diuretics at this time due to concern for worsening renal function.   Chronic venous insufficiency Venous stasis dermatitis  -Place Unna boots today.   Acute on chronic thrombocytopenia -No apparent cause of thrombocytopenia.  Patient will likely need further evaluation management in the outpatient setting by hematology.   Chronic Atrial Fibrillation  -Continue diltiazem and apixaban   DM  -Continue current diabetic management  CKD Stage III -Avoid nephrotoxic medications when possible  Diet: Heart Healthy IVF: None,None VTE: Enoxaparin Code: Full PT/OT recs: SNF for Subacute PT TOC recs: pending   Dispo: Anticipated discharge in 1 to 2 days.    Marianna Payment, D.O. [facility], PGY-1 Date 02/16/2020 Time 5:42 AM Pager: [phone]

## 2020-02-16 NOTE — TOC Initial Note (Signed)
Transition of Care [facility]) - Initial/Assessment Note    Patient Details  Name: Eric [name] MRN: [id number] Date of Birth: May 29, 1941  Transition of Care [facility]) CM/SW Contact:    Bethena Roys, RN Phone Number: 02/16/2020, 1:52 PM  Clinical Narrative:  Patient presented for increased leg swelling. Prior to arrival patient is from home with spouse. Case Manager discussed home health services with the patient and he feels like home health may not be reliable with showing up. Patient now wants to go to [facility]. Case Manager offered Medicare.gov list and patient only wants to be faxed out to [facility] He is familiar with [facility]. Patient states he will discuss with wife. Case Manager did speak with Physician Landry Mellow to make him aware of plan of care. Case Manager will fax the patient out to facility.               Expected Discharge Plan: Skilled Nursing Facility Barriers to Discharge: No Barriers Identified   Patient Goals and CMS Choice Patient states their goals for this hospitalization and ongoing recovery are:: to go to rehab to get stronger before going home CMS Medicare.gov Compare Post Acute Care list provided to:: Patient    Expected Discharge Plan and Services Expected Discharge Plan: [place] In-house Referral: NA Discharge Planning Services: CM Consult   Living arrangements for the past 2 months: Single Family Home                 DME Arranged: N/A HH Agency: NA    Prior Living Arrangements/Services Living arrangements for the past 2 months: Single Family Home Lives with:: Spouse Patient language and need for interpreter reviewed:: Yes Do you feel safe going back to the place where you live?: Yes      Need for Family Participation in Patient Care: Yes (Comment) Care giver support system in place?: Yes (comment)   Criminal Activity/Legal Involvement Pertinent to Current Situation/Hospitalization: No - Comment as  needed  Activities of Daily Living Home Assistive Devices/Equipment: None ADL Screening (condition at time of admission) Patient's cognitive ability adequate to safely complete daily activities?: Yes Is the patient deaf or have difficulty hearing?: Yes Does the patient have difficulty seeing, even when wearing glasses/contacts?: No Does the patient have difficulty concentrating, remembering, or making decisions?: No Patient able to express need for assistance with ADLs?: Yes Does the patient have difficulty dressing or bathing?: No Independently performs ADLs?: Yes (appropriate for developmental age) Does the patient have difficulty walking or climbing stairs?: Yes Weakness of Legs: Both Weakness of Arms/Hands: None  Permission Sought/Granted Permission sought to share information with : Family Supports, Chartered certified accountant granted to share information with : Yes, Verbal Permission Granted     Permission granted to share info w AGENCY: [facility]        Emotional Assessment Appearance:: Appears stated age Attitude/Demeanor/Rapport: Engaged Affect (typically observed): Appropriate Orientation: : Oriented to Situation, Oriented to  Time, Oriented to Place, Oriented to Self Alcohol / Substance Use: Not Applicable Psych Involvement: No (comment)  Admission diagnosis:  Acute on chronic diastolic heart failure ([name]) [I50.33] Thrombocytopenia ([name]) [D69.6] Renal insufficiency [N28.9] Serum total bilirubin elevated [R17] Normochromic normocytic anemia [D64.9] Patient Active Problem List   Diagnosis Date Noted  . Cellulitis of lower extremity   . Acute on chronic diastolic heart failure ([place]) 02/12/2020  . DIASTOLIC HEART FAILURE, CHRONIC 02/07/2009  . Diabetes mellitus ([place]) 02/06/2009  . OBESITY 02/06/2009  . Essential hypertension, benign 02/06/2009  .  Atrial fibrillation ([place]) 01/15/2009   PCP:  Forrestine Him, PA-C Pharmacy:   [id number] -  [place], [address] [place] [address] Phone: [phone] Fax: [phone]  Readmission Risk Interventions No flowsheet data found.

## 2020-02-17 LAB — COMPREHENSIVE METABOLIC PANEL
ALT: 21 U/L (ref 0–44)
AST: 15 U/L (ref 15–41)
Albumin: 3.1 g/dL — ABNORMAL LOW (ref 3.5–5.0)
Alkaline Phosphatase: 108 U/L (ref 38–126)
Anion gap: 11 (ref 5–15)
BUN: 87 mg/dL — ABNORMAL HIGH (ref 8–23)
CO2: 20 mmol/L — ABNORMAL LOW (ref 22–32)
Calcium: 8.6 mg/dL — ABNORMAL LOW (ref 8.9–10.3)
Chloride: 103 mmol/L (ref 98–111)
Creatinine, Ser: 2.63 mg/dL — ABNORMAL HIGH (ref 0.61–1.24)
GFR calc Af Amer: 26 mL/min — ABNORMAL LOW (ref >60)
GFR calc non Af Amer: 22 mL/min — ABNORMAL LOW (ref >60)
Glucose, Bld: 244 mg/dL — ABNORMAL HIGH (ref 70–99)
Potassium: 4.7 mmol/L (ref 3.5–5.1)
Sodium: 134 mmol/L — ABNORMAL LOW (ref 135–145)
Total Bilirubin: 1.7 mg/dL — ABNORMAL HIGH (ref 0.3–1.2)
Total Protein: 6.1 g/dL — ABNORMAL LOW (ref 6.5–8.1)

## 2020-02-17 LAB — CBC
HCT: 24.7 % — ABNORMAL LOW (ref 39.0–52.0)
Hemoglobin: 8.2 g/dL — ABNORMAL LOW (ref 13.0–17.0)
MCH: 30.1 pg (ref 26.0–34.0)
MCHC: 33.2 g/dL (ref 30.0–36.0)
MCV: 90.8 fL (ref 80.0–100.0)
Platelets: 102 10*3/uL — ABNORMAL LOW (ref 150–400)
RBC: 2.72 MIL/uL — ABNORMAL LOW (ref 4.22–5.81)
RDW: 15.3 % (ref 11.5–15.5)
WBC: 5.6 10*3/uL (ref 4.0–10.5)
nRBC: 0 % (ref 0.0–0.2)

## 2020-02-17 LAB — PATHOLOGIST SMEAR REVIEW

## 2020-02-17 LAB — GLUCOSE, CAPILLARY
Glucose-Capillary: 224 mg/dL — ABNORMAL HIGH (ref 70–99)
Glucose-Capillary: 193 mg/dL — ABNORMAL HIGH (ref 70–99)
Glucose-Capillary: 106 mg/dL — ABNORMAL HIGH (ref 70–99)
Glucose-Capillary: 122 mg/dL — ABNORMAL HIGH (ref 70–99)

## 2020-02-17 LAB — HAPTOGLOBIN: Haptoglobin: 116 mg/dL (ref 34–355)

## 2020-02-17 MED ORDER — TORSEMIDE 20 MG PO TABS
20.0000 mg | ORAL_TABLET | Freq: Every day | ORAL | Status: DC
  Administered 2020-02-17 – 2020-02-19 (×3): 20 mg via ORAL
  Filled 2020-02-17 (×3): qty 1

## 2020-02-17 NOTE — TOC Progression Note (Addendum)
Transition of Care [facility]) - Progression Note    Patient Details  Name: Eric [name] MRN: [id number] Date of Birth: 09-25-1940  Transition of Care [facility]) CM/SW [name], [place] Phone Number: [phone] 02/17/2020, 8:50 AM  Clinical Narrative:     CSW checked authorization with [facility] and authorization is still pending.  4:39 pm: Insurance authorization is still pending.  TOC team will continue to assist with discharge planning needs.  Expected Discharge Plan: [place] Barriers to Discharge: No Barriers Identified  Expected Discharge Plan and Services Expected Discharge Plan: [place] In-house Referral: NA Discharge Planning Services: CM Consult   Living arrangements for the past 2 months: Single Family Home                 DME Arranged: N/A           HH Agency: NA         Social Determinants of Health (SDOH) Interventions    Readmission Risk Interventions No flowsheet data found.

## 2020-02-17 NOTE — Progress Notes (Signed)
HD#5 Subjective:  Overnight Events: no events overnight  Eric [name] was resting comfortable in bed. He states that he continues to have some pain in his legs, but too early to tell if the wraps are helping. Otherwise, he denies chest pain, shortness of breath, abdominal bloating.   Objective:  Vital signs in last 24 hours: Vitals:   02/15/20 2100 02/16/20 0525 02/16/20 2120 02/17/20 0443  BP: (!) 145/63 (!) 130/55 (!) 153/62 (!) 132/52  Pulse: (!) 51 63 67 66  Resp: 18 18 19 16   Temp: 98.1 F (36.7 C) 99.1 F (37.3 C) 98.3 F (36.8 C) 98.7 F (37.1 C)  TempSrc: Oral  Oral Oral  SpO2: 99% 100% 100% 98%  Weight:  110.4 kg  110.9 kg  Height:       Supplemental O2: RA SpO2: 98 %  Physical Exam:  Physical Exam  Constitutional: He is oriented to person, place, and time and well-developed, well-nourished, and in no distress.  HENT:  Head: Normocephalic and atraumatic.  Eyes: EOM are normal.  Neck: No tracheal deviation present.  Cardiovascular: Normal rate and intact distal pulses. Exam reveals no gallop and no friction rub.  No murmur heard. Pulmonary/Chest: Effort normal and breath sounds normal. No respiratory distress. He has no wheezes. He has no rales. He exhibits no tenderness.  Abdominal: Soft. Bowel sounds are normal. He exhibits no distension. There is no abdominal tenderness.  Musculoskeletal:        General: Edema (R>L, wheeping, with worsening erythema on the upper calf) present. No tenderness. Normal range of motion.  Neurological: He is alert and oriented to person, place, and time.  Skin: Skin is warm and dry.    Filed Weights   02/15/20 0434 02/16/20 0525 02/17/20 0443  Weight: 112.7 kg 110.4 kg 110.9 kg     Intake/Output Summary (Last 24 hours) at 02/17/2020 0545 Last data filed at 02/17/2020 0400 Gross per 24 hour  Intake 240 ml  Output 600 ml  Net -360 ml   Net IO Since Admission: -5,245 mL [02/17/20 0545]  Pertinent Labs: CBC Latest Ref  Rng & Units 02/16/2020 02/15/2020 02/14/2020  WBC 4.0 - 10.5 K/uL 7.0 7.3 9.9  Hemoglobin 13.0 - 17.0 g/dL 7.8(L) 8.0(L) 9.4(L)  Hematocrit 39.0 - 52.0 % 23.7(L) 24.6(L) 28.1(L)  Platelets 150 - 400 K/uL 79(L) 73(L) 78(L)    CMP Latest Ref Rng & Units 02/16/2020 02/15/2020 02/14/2020  Glucose 70 - 99 mg/dL 196(H) 159(H) 135(H)  BUN 8 - 23 mg/dL 85(H) 79(H) 67(H)  Creatinine 0.61 - 1.24 mg/dL 2.80(H) 2.78(H) 2.37(H)  Sodium 135 - 145 mmol/L 138 136 139  Potassium 3.5 - 5.1 mmol/L 5.1 4.5 5.2(H)  Chloride 98 - 111 mmol/L 104 105 108  CO2 22 - 32 mmol/L 24 22 21(L)  Calcium 8.9 - 10.3 mg/dL 8.6(L) 8.6(L) 8.9  Total Protein 6.5 - 8.1 g/dL - - -  Total Bilirubin 0.3 - 1.2 mg/dL - - -  Alkaline Phos 38 - 126 U/L - - -  AST 15 - 41 U/L - - -  ALT 0 - 44 U/L - - -    Pending Labs: Serum light chains SPEP  Imaging: No results found.   Assessment/Plan:   Principal Problem:   Diabetes mellitus ([place]) Active Problems:   Essential hypertension, benign   Atrial fibrillation ([name])   Acute on chronic diastolic heart failure ([name])   Cellulitis of lower extremity   Patient Summary: Eric [name] is a 79 y.o.  with a pertinent PMH of HTN, type II DM, persistent afib on Eliquis, chronic diastolic heart failure, presented with volume overload due to HFpEF exacerbation in the setting of dietary indiscretion on hospital day 5  Acute on chronic diastolic heart failure - Net diuresis of 5.2 L.  - Continue to hold diuretics in the setting of AKI - Patient appear euvolemic, but continues to have lower extremity edema - Monitor electrolyte changes and replete as needed.   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  - Pending renal function labs today.  - Avoiding nephrotoxic medications in the setting of this acute change.  Chronicvenous insufficiency Venous stasis dermatitis - Imagine negative for DVT in the right leg, ABIs were limited due to pain, but no apparent PAD -  Unna boots in place.  - Patient continues to have LE pain. I am hopeful that this will improve with improvement of his edema. - Continue to monitor for signs of infections.   Acute on chronic thrombocytopenia - Continue to monitor with daily CBC - Avoid iatrogenic causes of thrombocytopenia  Chronic Atrial Fibrillation - Continue diltiazem and apixaban.  DM - Continue current DM management   Diet: Heart Healthy IVF: None,None VTE: Enoxaparin Code: Full PT/OT recs: SNF for Subacute PT TOC recs: consulted for PCP referral and SNF placement  Dispo: Anticipated discharge in 1-2 days.    Marianna Payment, D.O. [facility], PGY-1 Date 02/17/2020 Time 5:45 AM Pager: [phone]

## 2020-02-18 LAB — BASIC METABOLIC PANEL
Anion gap: 9 (ref 5–15)
BUN: 86 mg/dL — ABNORMAL HIGH (ref 8–23)
CO2: 22 mmol/L (ref 22–32)
Calcium: 8.3 mg/dL — ABNORMAL LOW (ref 8.9–10.3)
Chloride: 105 mmol/L (ref 98–111)
Creatinine, Ser: 2.47 mg/dL — ABNORMAL HIGH (ref 0.61–1.24)
GFR calc Af Amer: 28 mL/min — ABNORMAL LOW (ref >60)
GFR calc non Af Amer: 24 mL/min — ABNORMAL LOW (ref >60)
Glucose, Bld: 136 mg/dL — ABNORMAL HIGH (ref 70–99)
Potassium: 4.6 mmol/L (ref 3.5–5.1)
Sodium: 136 mmol/L (ref 135–145)

## 2020-02-18 LAB — CBC
HCT: 22.9 % — ABNORMAL LOW (ref 39.0–52.0)
Hemoglobin: 7.6 g/dL — ABNORMAL LOW (ref 13.0–17.0)
MCH: 30.2 pg (ref 26.0–34.0)
MCHC: 33.2 g/dL (ref 30.0–36.0)
MCV: 90.9 fL (ref 80.0–100.0)
Platelets: 101 10*3/uL — ABNORMAL LOW (ref 150–400)
RBC: 2.52 MIL/uL — ABNORMAL LOW (ref 4.22–5.81)
RDW: 15.2 % (ref 11.5–15.5)
WBC: 4.4 10*3/uL (ref 4.0–10.5)
nRBC: 0 % (ref 0.0–0.2)

## 2020-02-18 LAB — SARS CORONAVIRUS 2 (TAT 6-24 HRS): SARS Coronavirus 2: NEGATIVE

## 2020-02-18 LAB — GLUCOSE, CAPILLARY
Glucose-Capillary: 188 mg/dL — ABNORMAL HIGH (ref 70–99)
Glucose-Capillary: 215 mg/dL — ABNORMAL HIGH (ref 70–99)
Glucose-Capillary: 145 mg/dL — ABNORMAL HIGH (ref 70–99)
Glucose-Capillary: 99 mg/dL (ref 70–99)

## 2020-02-18 NOTE — TOC Progression Note (Signed)
Transition of Care [facility]) - Progression Note    Patient Details  Name: Eric [name] MRN: [id number] Date of Birth: 01/13/1941  Transition of Care [facility]) CM/SW [place],  Phone Number: [phone] 02/18/2020, 9:54 AM  Clinical Narrative:     CSW checked an authorization is back. CSW will follow up in regards to patient being medically stable for discharge.  Authorization # [id number] Dates: 5/29-6/1  [facility] team will continue to monitor for discharge planning needs. Expected Discharge Plan: [place] Barriers to Discharge: No Barriers Identified  Expected Discharge Plan and Services Expected Discharge Plan: [place] In-house Referral: NA Discharge Planning Services: CM Consult   Living arrangements for the past 2 months: Single Family Home                 DME Arranged: N/A           HH Agency: NA         Social Determinants of Health (SDOH) Interventions    Readmission Risk Interventions No flowsheet data found.

## 2020-02-18 NOTE — Progress Notes (Signed)
HD#6 Subjective:  Overnight Events: none  Eric [name] was seen and evaluated at bedside on morning rounds. Still having some leg pain. We discussed that his swelling is multifactorial in terms of his heart and his venous insufficiency.  He has some concerns that he is not sure he can rehab within the usual 21 days.   Objective:  Vital signs in last 24 hours: Vitals:   02/18/20 0438 02/18/20 0443 02/18/20 0733 02/18/20 1117  BP:  (!) 128/58 (!) 141/55 (!) 135/44  Pulse:   73 71  Resp: 18  16 18   Temp: 98.1 F (36.7 C)  97.7 F (36.5 C) 98 F (36.7 C)  TempSrc: Oral  Oral Oral  SpO2: 99%  98% 98%  Weight: 110.2 kg     Height:       Supplemental O2: RA SpO2: 98 %   Physical Exam:  Physical Exam  Constitutional: He is oriented to person, place, and time and well-developed, well-nourished, and in no distress.  HENT:  Head: Normocephalic and atraumatic.  Eyes: EOM are normal.  Neck: No tracheal deviation present.  Cardiovascular: Normal rate and intact distal pulses. Exam reveals no gallop and no friction rub.  No murmur heard. Pulmonary/Chest: Effort normal and breath sounds normal. No respiratory distress.  Abdominal: Soft. Bowel sounds are normal. He exhibits no distension. There is no abdominal tenderness.  Musculoskeletal:        General: Tenderness (Bilateral LE) and edema (R>L) present. Normal range of motion.  Neurological: He is alert and oriented to person, place, and time.  Skin: Skin is warm and dry.    Filed Weights   02/16/20 0525 02/17/20 0443 02/18/20 0438  Weight: 110.4 kg 110.9 kg 110.2 kg     Intake/Output Summary (Last 24 hours) at 02/18/2020 1218 Last data filed at 02/18/2020 0735 Gross per 24 hour  Intake 203 ml  Output 700 ml  Net -497 ml   Net IO Since Admission: -5,967 mL [02/18/20 1218]  Pertinent Labs: CBC Latest Ref Rng & Units 02/18/2020 02/17/2020 02/16/2020  WBC 4.0 - 10.5 K/uL 4.4 5.6 7.0  Hemoglobin 13.0 - 17.0 g/dL 7.6(L)  8.2(L) 7.8(L)  Hematocrit 39.0 - 52.0 % 22.9(L) 24.7(L) 23.7(L)  Platelets 150 - 400 K/uL 101(L) 102(L) 79(L)    CMP Latest Ref Rng & Units 02/18/2020 02/17/2020 02/16/2020  Glucose 70 - 99 mg/dL 136(H) 244(H) 196(H)  BUN 8 - 23 mg/dL 86(H) 87(H) 85(H)  Creatinine 0.61 - 1.24 mg/dL 2.47(H) 2.63(H) 2.80(H)  Sodium 135 - 145 mmol/L 136 134(L) 138  Potassium 3.5 - 5.1 mmol/L 4.6 4.7 5.1  Chloride 98 - 111 mmol/L 105 103 104  CO2 22 - 32 mmol/L 22 20(L) 24  Calcium 8.9 - 10.3 mg/dL 8.3(L) 8.6(L) 8.6(L)  Total Protein 6.5 - 8.1 g/dL - 6.1(L) -  Total Bilirubin 0.3 - 1.2 mg/dL - 1.7(H) -  Alkaline Phos 38 - 126 U/L - 108 -  AST 15 - 41 U/L - 15 -  ALT 0 - 44 U/L - 21 -    Pending Labs: none  Imaging: No results found.   Assessment/Plan:   Principal Problem:   Diabetes mellitus ([place]) Active Problems:   Essential hypertension, benign   Atrial fibrillation ([name])   Acute on chronic diastolic heart failure ([name])   Cellulitis of lower extremity   Patient Summary: Eric [name] a 79 y.o.with a pertinent PMH of HTN, type II DM, persistent afib on Eliquis,chronic diastolic heart failure, presented withvolume overload  due to HFpEF exacerbation in the setting of dietary indiscretionon hospital day5  Acute on chronic diastolic heart failure -Net diuresis of 5.7 L since admission -Restarted home dose of torsemide 20 mg to maintain euvolemia -Continue to monitor electrolytes and replace as needed.  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  Creatinine of 1. -This is likely prerenal azotemia on top of his chronic kidney disease.  We will continue to monitor it daily.  Chronicvenous insufficiency Venous stasis dermatitis -Continues to complain of pain in his bilateral lower extremities. -Unna boots in place with minimal weeping from either leg. -Monitor for signs of infection.  Acute on chronic thrombocytopenia - Stable continue to avoid  iatrogenic causes as possible.   Chronic Atrial Fibrillation -Diltiazem and apixaban  DM - Continue current DM management   Diet: Heart Healthy IVF: None,None VTE: NOAC Code: Full PT/OT recs: Pending TOC recs: Pending   Dispo: Anticipated discharge tomorrow.    Eric [name], D.O. MCIMTP, PGY-1 Date 02/18/2020 Time 12:18 PM Pager: [phone]

## 2020-02-18 NOTE — TOC Progression Note (Signed)
Transition of Care [facility]) - Progression Note    Patient Details  Name: Kevork Joyce MRN: [id number] Date of Birth: 1941-03-30  Transition of Care [facility]) CM/SW [place], [place] Phone Number: [phone] 02/18/2020, 3:21 PM  Clinical Narrative:     CSW spoke with facility and they asked to follow up tomorrow for bed readiness. Patient will need updated COVID. CSW alerted RN on discharge status.  TOC team will continue to monitor for discharge planing needs. Expected Discharge Plan: [place] Barriers to Discharge: No Barriers Identified  Expected Discharge Plan and Services Expected Discharge Plan: [place] In-house Referral: NA Discharge Planning Services: CM Consult   Living arrangements for the past 2 months: Single Family Home                 DME Arranged: N/A           HH Agency: NA         Social Determinants of Health (SDOH) Interventions    Readmission Risk Interventions No flowsheet data found.

## 2020-02-19 DIAGNOSIS — I872 Venous insufficiency (chronic) (peripheral): Secondary | ICD-10-CM

## 2020-02-19 HISTORY — DX: Venous insufficiency (chronic) (peripheral): I87.2

## 2020-02-19 LAB — CBC
HCT: 22.2 % — ABNORMAL LOW (ref 39.0–52.0)
Hemoglobin: 7.3 g/dL — ABNORMAL LOW (ref 13.0–17.0)
MCH: 29.6 pg (ref 26.0–34.0)
MCHC: 32.9 g/dL (ref 30.0–36.0)
MCV: 89.9 fL (ref 80.0–100.0)
Platelets: 102 10*3/uL — ABNORMAL LOW (ref 150–400)
RBC: 2.47 MIL/uL — ABNORMAL LOW (ref 4.22–5.81)
RDW: 15.1 % (ref 11.5–15.5)
WBC: 4.4 10*3/uL (ref 4.0–10.5)
nRBC: 0 % (ref 0.0–0.2)

## 2020-02-19 LAB — BASIC METABOLIC PANEL
Anion gap: 10 (ref 5–15)
BUN: 82 mg/dL — ABNORMAL HIGH (ref 8–23)
CO2: 20 mmol/L — ABNORMAL LOW (ref 22–32)
Calcium: 8.3 mg/dL — ABNORMAL LOW (ref 8.9–10.3)
Chloride: 108 mmol/L (ref 98–111)
Creatinine, Ser: 2.45 mg/dL — ABNORMAL HIGH (ref 0.61–1.24)
GFR calc Af Amer: 28 mL/min — ABNORMAL LOW (ref >60)
GFR calc non Af Amer: 24 mL/min — ABNORMAL LOW (ref >60)
Glucose, Bld: 109 mg/dL — ABNORMAL HIGH (ref 70–99)
Potassium: 4.3 mmol/L (ref 3.5–5.1)
Sodium: 138 mmol/L (ref 135–145)

## 2020-02-19 LAB — GLUCOSE, CAPILLARY
Glucose-Capillary: 192 mg/dL — ABNORMAL HIGH (ref 70–99)
Glucose-Capillary: 239 mg/dL — ABNORMAL HIGH (ref 70–99)

## 2020-02-19 NOTE — TOC Progression Note (Signed)
Transition of Care [facility]) - Progression Note    Patient Details  Name: Eric [name] MRN: [id number] Date of Birth: 07-23-1941  Transition of Care [facility]) CM/SW [place], [place] Phone Number: 02/19/2020, 12:57 PM  Clinical Narrative:     CSW spoke with [place] from [facility] authorization has been approved for patient. Insurance reference number [id number] is 5/29-6/1. Patient has bed at [facility] when medically ready.    Expected Discharge Plan: [place] Barriers to Discharge: No Barriers Identified  Expected Discharge Plan and Services Expected Discharge Plan: [place] In-house Referral: NA Discharge Planning Services: CM Consult   Living arrangements for the past 2 months: Single Family Home                 DME Arranged: N/A           HH Agency: NA         Social Determinants of Health (SDOH) Interventions    Readmission Risk Interventions No flowsheet data found.

## 2020-02-19 NOTE — Progress Notes (Signed)
I attempted to call [facility]. Neoma Laming picked up the phone but said nobody was answering. She took my phone number and stated someone would call me back

## 2020-02-19 NOTE — Discharge Summary (Addendum)
Name: Eric [name] MRN: [id number] DOB: 03-Jan-1941 79 y.o. PCP: [name]  Date of Admission: 02/12/2020  3:32 AM Date of Discharge: 02/19/20 Attending Physician: Oda Kilts, MD  Discharge Diagnosis: 1. Acute on chronic heart failure 2. Venous insufficiency  3. Gout 4. Acute on chronic kidney disease 5. Thrombocytopenia  Discharge Medications: Allergies as of 02/19/2020       Reactions   Hydrocodone Nausea And Vomiting   Iohexol     Desc: "shuts kidneys down"   Morphine And Related    Sulfa Antibiotics    Sulfonamide Derivatives         Medication List     STOP taking these medications    allopurinol 300 MG tablet Commonly known as: ZYLOPRIM       TAKE these medications    diltiazem 360 MG 24 hr capsule Commonly known as: TIAZAC Take 360 mg by mouth daily.   Eliquis 5 MG Tabs tablet Generic drug: apixaban Take 5 mg by mouth 2 (two) times daily. Taking once daily   ferrous sulfate 325 (65 FE) MG tablet Take 325 mg by mouth daily with breakfast.   insulin lispro 100 UNIT/ML KwikPen Commonly known as: HUMALOG Inject 14 Units into the skin 3 (three) times daily.   levothyroxine 100 MCG tablet Commonly known as: SYNTHROID Take 100 mcg by mouth See admin instructions. Taking 1/2 tablet (31mcg) on Sunday only.   PROBIOTIC ACIDOPHILUS BEADS PO Take 1 tablet by mouth daily.   silodosin 8 MG Caps capsule Commonly known as: RAPAFLO Take 8 mg by mouth daily.   simvastatin 20 MG tablet Commonly known as: ZOCOR Take 20 mg by mouth at bedtime.   spironolactone 50 MG tablet Commonly known as: ALDACTONE Take 50 mg by mouth daily.   torsemide 20 MG tablet Commonly known as: DEMADEX Take 20 mg by mouth daily.   Toujeo SoloStar 300 UNIT/ML Solostar Pen Generic drug: insulin glargine (1 Unit Dial) Inject 46 Units into the skin daily.   VITAMIN C & E COMPLEX PO Take 1 tablet by mouth daily. Also has vit A 447mcg,, zinc   Vitamin D 50  MCG (2000 UT) Caps Take 2,000 Units by mouth daily.        Disposition and follow-up:   [name] was discharged from [facility] in Stable condition.  At the hospital follow up visit please address:  1.  Follow-up:  A.  Heart failure -make sure patient is taking spironolactone and torsemide, recheck electrolytes and kidney function  B.  Acute on chronic kidney disease -patient developed an AKI and will need follow-up to make sure his prerenal azotemia is resolved in the outpatient setting.  Repeat BMP  C.  Thrombocytopenia -make sure patient goes to his appointment for hematology.  Referral was placed in hospital.  D.  Venous insufficiency -patient will need to continue to have his Unna boot replaced, check for infection periodically as he is at high risk, make sure patient goes to his pain clinic appointment.  Referral was placed in hospital.   E.  Gout -patient's allopurinol was stopped during his hospitalization due to concern for worsening kidney function.  He will need to uric acid level and restart his allopurinol in the outpatient setting.  2.  Labs / imaging needed at time of follow-up: CBC, BMP, uric acid level  3.  Pending labs/ test needing follow-up: None  Follow-up Appointments:    Hospital Course by problem list: 1.  Acute on  chronic diastolic heart failure -patient presented with significant lower extremity edema and signs of hypervolemia.  Initially tried to set up patient for hospital at home program for home Lasix.  Patient declined this.  Patient was given IV diuretics with net diuresis of 6.9 L.  Patient was transitioned back to home torsemide dosing prior to discharge.  2.  Venous insufficiency -patient has significant lower extremity edema secondary to multifactorial.  He likely has a component of congestive heart failure.  Patient does show signs of significant venous stasis dermatitis with weeping lower extremities.  There was some  concern for DVT considering his right leg was larger than his left.  Vascular ultrasound did not show any thrombus.  ABIs were performed although the study was not effectively able to be performed due to pain, but TBI did appear low on the left.  Patient's legs were wrapped with Unna boots.  Outpatient referral was set up for vein clinic as he will need further evaluation and management of his lower extremity edema.  Patient's legs have significant weeping at times and is at high risk for infection.  He will need consistent surveillance for cellulitis moving forward.  3.  Thrombocytopenia -patient has chronic thrombocytopenia for unknown etiology.  He was started on Keflex due to concern for cellulitis in the left leg.  Patient's platelet count acutely became lower.  He was discontinued on this medication and his platelet count improved.  I put in an outpatient referral for hematology for further evaluation and management of his platelet count.  4.  Acute on chronic kidney disease - CKD stage III at baseline.  Patient developed worsening renal function likely secondary to prerenal azotemia.  Patient is kidney function has improved once stopping IV diuresis.  Is not fully resolved at this time will need to be checked in the outpatient setting.  Discharge Vitals:   BP (!) 131/53 (BP Location: Left Arm)   Pulse 64   Temp 97.9 F (36.6 C) (Oral)   Resp 19   Ht 6' (1.829 m)   Wt 110.4 kg   SpO2 100%   BMI 33.00 kg/m   Pertinent Labs, Studies, and Procedures:  CBC Latest Ref Rng & Units 02/19/2020 02/18/2020 02/17/2020  WBC 4.0 - 10.5 K/uL 4.4 4.4 5.6  Hemoglobin 13.0 - 17.0 g/dL 7.3(L) 7.6(L) 8.2(L)  Hematocrit 39.0 - 52.0 % 22.2(L) 22.9(L) 24.7(L)  Platelets 150 - 400 K/uL 102(L) 101(L) 102(L)   CMP Latest Ref Rng & Units 02/19/2020 02/18/2020 02/17/2020  Glucose 70 - 99 mg/dL 109(H) 136(H) 244(H)  BUN 8 - 23 mg/dL 82(H) 86(H) 87(H)  Creatinine 0.61 - 1.24 mg/dL 2.45(H) 2.47(H) 2.63(H)  Sodium 135  - 145 mmol/L 138 136 134(L)  Potassium 3.5 - 5.1 mmol/L 4.3 4.6 4.7  Chloride 98 - 111 mmol/L 108 105 103  CO2 22 - 32 mmol/L 20(L) 22 20(L)  Calcium 8.9 - 10.3 mg/dL 8.3(L) 8.3(L) 8.6(L)  Total Protein 6.5 - 8.1 g/dL - - 6.1(L)  Total Bilirubin 0.3 - 1.2 mg/dL - - 1.7(H)  Alkaline Phos 38 - 126 U/L - - 108  AST 15 - 41 U/L - - 15  ALT 0 - 44 U/L - - 21     Discharge Instructions: Discharge Instructions     Ambulatory referral to Hematology   Complete by: As directed    Ambulatory referral to Vascular Surgery   Complete by: As directed    Diet - low sodium heart healthy   Complete by: As  directed    Increase activity slowly   Complete by: As directed        Signed: Mosetta Anis, MD 02/19/2020, 1:14 PM   Pager: [phone]

## 2020-02-19 NOTE — Progress Notes (Signed)
Orthopedic Tech Progress Note Patient Details:  Eric [name] 10-Mar-1941 [id number]  Ortho Devices Type of Ortho Device: Haematologist Ortho Device/Splint Location: BLE Ortho Device/Splint Interventions: Ordered, Application   Post Interventions Patient Tolerated: Well Instructions Provided: Care of device   Janit Pagan 02/19/2020, 1:49 PM

## 2020-02-19 NOTE — Progress Notes (Addendum)
HD#7 Subjective:  Overnight Events: no overnight events.  Eric [name] sitting on the edge of the bed. He is anxious about his long term prognosis and he need for more skilled nursing care. Otherwise he continues to have lower extremity edema and pain.   Objective:  Vital signs in last 24 hours: Vitals:   02/18/20 2005 02/19/20 0027 02/19/20 0431 02/19/20 0846  BP: (!) 136/52 (!) 145/46 (!) 131/48 (!) 130/45  Pulse: (!) 55 64 64   Resp: 20 16 19    Temp: 98.8 F (37.1 C) 98.4 F (36.9 C) 97.7 F (36.5 C)   TempSrc: Oral Oral Oral   SpO2: 98% 98% 100%   Weight:   110.4 kg   Height:       Supplemental O2: Room air SpO2: 100 %   Physical Exam:  Physical Exam  Constitutional: He is oriented to person, place, and time and well-developed, well-nourished, and in no distress.  HENT:  Head: Normocephalic and atraumatic.  Eyes: EOM are normal.  Neck: No tracheal deviation present.  Cardiovascular: Normal rate and intact distal pulses. Exam reveals no gallop and no friction rub.  No murmur heard. Pulmonary/Chest: Effort normal and breath sounds normal. No respiratory distress.  Abdominal: Soft. Bowel sounds are normal. He exhibits no distension. There is no abdominal tenderness.  Musculoskeletal:        General: Tenderness and edema (R>L) present. Normal range of motion.  Neurological: He is alert and oriented to person, place, and time.  Skin: Skin is warm and dry.    Filed Weights   02/17/20 0443 02/18/20 0438 02/19/20 0431  Weight: 110.9 kg 110.2 kg 110.4 kg     Intake/Output Summary (Last 24 hours) at 02/19/2020 0849 Last data filed at 02/19/2020 0750 Gross per 24 hour  Intake --  Output 1000 ml  Net -1000 ml   Net IO Since Admission: -6,967 mL [02/19/20 0849]  Pertinent Labs: CBC Latest Ref Rng & Units 02/19/2020 02/18/2020 02/17/2020  WBC 4.0 - 10.5 K/uL 4.4 4.4 5.6  Hemoglobin 13.0 - 17.0 g/dL 7.3(L) 7.6(L) 8.2(L)  Hematocrit 39.0 - 52.0 % 22.2(L) 22.9(L)  24.7(L)  Platelets 150 - 400 K/uL 102(L) 101(L) 102(L)    CMP Latest Ref Rng & Units 02/19/2020 02/18/2020 02/17/2020  Glucose 70 - 99 mg/dL 109(H) 136(H) 244(H)  BUN 8 - 23 mg/dL 82(H) 86(H) 87(H)  Creatinine 0.61 - 1.24 mg/dL 2.45(H) 2.47(H) 2.63(H)  Sodium 135 - 145 mmol/L 138 136 134(L)  Potassium 3.5 - 5.1 mmol/L 4.3 4.6 4.7  Chloride 98 - 111 mmol/L 108 105 103  CO2 22 - 32 mmol/L 20(L) 22 20(L)  Calcium 8.9 - 10.3 mg/dL 8.3(L) 8.3(L) 8.6(L)  Total Protein 6.5 - 8.1 g/dL - - 6.1(L)  Total Bilirubin 0.3 - 1.2 mg/dL - - 1.7(H)  Alkaline Phos 38 - 126 U/L - - 108  AST 15 - 41 U/L - - 15  ALT 0 - 44 U/L - - 21    Pending Labs: None  Imaging: No results found.  Assessment/Plan:   Principal Problem:   Diabetes mellitus ([place]) Active Problems:   Essential hypertension, benign   Atrial fibrillation ([name])   Acute on chronic diastolic heart failure ([name])   Cellulitis of lower extremity   Venous insufficiency    Patient Summary: Eric [name] a 79 y.o.with a pertinent PMH of HTN, type II DM, persistent afib on Eliquis,chronic diastolic heart failure, presented withvolume overload due to HFpEF exacerbation in the setting of dietary  indiscretionon hospital day7.  Acute on chronic diastolic heart failure -Diuresis since admission of 6.9 mL -Patient appears euvolemic and restarted on his home dose of torsemide 20 mg to maintain fluid status -Continue to monitor electrolytes and replete as needed  Acute onCKD Stage III -Patient's kidney function continues to improve slowly.  Patient will need repeat labs in the outpatient center to make sure his kidney function has returned to normal.  Chronicvenous insufficiency Venous stasis dermatitis -Continues to have pain in this lower extremities.  Unna boots in place.  They will be replaced today prior to discharge to SNF. -Patient will likely need close outpatient follow-up with vascular for further evaluation management of  his chronic lower extremity edema.  Acute on chronic thrombocytopenia - Stable - Will place referral to hematology in the outpatient setting..   Chronic Atrial Fibrillation -Controlled -Continue diltiazem and apixaban  DM -Continue current diabetic management.  Diet: Heart Healthy IVF: None,None VTE: NOAC Code: Full PT/OT recs: SNF for Subacute PT TOC recs: SNF placement   Dispo: Anticipated discharge today.    Marianna Payment, D.O. [facility], PGY-1 Date 02/19/2020 Time 8:49 AM Pager: [phone]

## 2020-02-19 NOTE — Progress Notes (Signed)
Report given to Samantha

## 2020-02-19 NOTE — TOC Transition Note (Signed)
Transition of Care [facility]) - CM/SW Discharge Note   Patient Details  Name: Eric [name] MRN: [id number] Date of Birth: 1941/02/04  Transition of Care [facility]) CM/SW Contact:  Eric [name], Eric [name] Phone Number: 02/19/2020, 1:20 PM   Clinical Narrative:     Patient will DC to: [facility]   Anticipated DC date: 02/19/2020  Family notified: Eric [name]  Transport by: Eric [name]  ?  Per MD patient ready for DC to [name] . RN, patient, patient's family, and facility notified of DC. Discharge Summary sent to facility. RN given number for report tele# [phone] RM#[phone]. DC packet on chart. Ambulance transport requested for patient.  CSW signing off.  Final next level of care: Skilled Nursing Facility Barriers to Discharge: No Barriers Identified   Patient Goals and CMS Choice Patient states their goals for this hospitalization and ongoing recovery are:: SNF CMS Medicare.gov Compare Post Acute Care list provided to:: Patient Choice offered to / list presented to : Patient  Discharge Placement              Patient chooses bed at: [facility] Patient to be transferred to facility by: [place] Name of family member notified: Eric [name] Patient and family notified of of transfer: 02/19/20  Discharge Plan and Services In-house Referral: NA Discharge Planning Services: CM Consult            DME Arranged: N/A           HH Agency: NA        Social Determinants of Health ([place]) Interventions     Readmission Risk Interventions No flowsheet data found.

## 2020-02-19 NOTE — Progress Notes (Signed)
Report given, patient stable and transported via [facility] with belongings

## 2020-02-19 NOTE — TOC Progression Note (Signed)
Transition of Care [facility]) - Progression Note    Patient Details  Name: Eric [name] MRN: [id number] Date of Birth: Jun 04, 1941  Transition of [facility]) CM/SW [place], [place] Phone Number: 02/19/2020, 10:22 AM  Clinical Narrative:     Insurance authorization is pending reference number is [id number]. CSW called [facility] to follow up with possible SNF placement for patient. Physical therapy will reevaluate patient today for SNF recommendation.  CSW will continue to follow.   Expected Discharge Plan: [place] Barriers to Discharge: No Barriers Identified  Expected Discharge Plan and Services Expected Discharge Plan: [place] In-house Referral: NA Discharge Planning Services: CM Consult   Living arrangements for the past 2 months: Single Family Home                 DME Arranged: N/A           HH Agency: NA         Social Determinants of Health (SDOH) Interventions    Readmission Risk Interventions No flowsheet data found.

## 2020-02-19 NOTE — Progress Notes (Signed)
Physical Therapy Treatment Patient Details Name: Eric [name] MRN: [id number] DOB: Apr 13, 1941 Today's Date: 02/19/2020    History of Present Illness [name] is a 79 y.o. who  has a past medical history of Gall bladder disease (2020) and History of right hip replacement., HTN, Type II DM, persistent atrial fibrillation on Eliquis, and chronic diastolic heart failure presented with hypervolemia and admit for acute on chronic heart failure Admitted 02/12/20    PT Comments    Pt supine in bed on entry, agreeable to therapy, but states he does not think he can get to the chair due to the pain in his R LE. Pt LE in UNNA boot, however skin above the boot is noted to have increased edema, erythemia and is warm to the touch. Pt requires min A to sit EoB, reporting increased in R LE pain to 10/10 with R LE in dependent position. In propped position, pt able to tolerate sitting and gentle AAROM of R LE. After 5 minutes pt request return to supine. Pt requires modA for R LE support as pt scoots laterally towards HoB, and modA for returning to supine. Once in supine pt able to perform LE therapeutic exercise. At end of session, elevated LE above heart in supine and encouraged continued ankle pumps to aid in edema reduction. Due to decreased mobility tolerance, PT now recommending SNF level rehab at discharge to improve strength and mobility for ultimate discharge home. PT will continue to follow acutely.    Follow Up Recommendations  SNF     Equipment Recommendations  None recommended by PT       Precautions / Restrictions Precautions Precautions: Fall Restrictions Weight Bearing Restrictions: No    Mobility  Bed Mobility Overal bed mobility: Needs Assistance Bed Mobility: Supine to Sit;Sit to Supine     Supine to sit: Min assist Sit to supine: Mod assist   General bed mobility comments: min A for pt to pull up against therapist to come to sitting EoB, pt does not tolerate placement of R LE  on floor, tolerated sitting EoB for 5 minutes with R LE propped on trashcan. Pt requires modA for management of LE back into bed  Transfers Overall transfer level: Needs assistance   Transfers: Lateral/Scoot Transfers          Lateral/Scoot Transfers: Mod assist General transfer comment: pt refuses coming to upright due to pain, able to scoot up towards HoB with PT supporting R LE       Balance Overall balance assessment: Needs assistance Sitting-balance support: Feet supported Sitting balance-Leahy Scale: Good                                      Cognition Arousal/Alertness: Awake/alert Behavior During Therapy: WFL for tasks assessed/performed Overall Cognitive Status: Within Functional Limits for tasks assessed                                        Exercises General Exercises - Lower Extremity Ankle Circles/Pumps: AAROM;Right;10 reps;Seated;AROM;Left;Supine Short Arc Quad: AROM;Right;5 reps;Supine Heel Slides: AROM;Left;10 reps;Supine Hip Flexion/Marching: AROM;Right;5 reps;Supine;Left;10 reps    General Comments General comments (skin integrity, edema, etc.): Pt with bilateral Unna Boots, edema in L LE much improved since last time PT saw pt, R LE continues to exhibit, dolor, rubor, tumor and calor  limiting pt ablitity to have LE in dependent postion       Pertinent Vitals/Pain Pain Assessment: 0-10 Pain Score: 10-Worst pain ever Pain Location: R LE due to edema, especially in dependent position  Pain Descriptors / Indicators: Burning;Throbbing Pain Intervention(s): Limited activity within patient's tolerance;Monitored during session;Repositioned           PT Goals (current goals can now be found in the care plan section) Acute Rehab PT Goals PT Goal Formulation: With patient Time For Goal Achievement: 02/28/20 Potential to Achieve Goals: Fair Progress towards PT goals: Not progressing toward goals - comment(limited by LE pain  )    Frequency    Min 2X/week      PT Plan Discharge plan needs to be updated;Frequency needs to be updated       AM-PAC PT "6 Clicks" Mobility   Outcome Measure  Help needed turning from your back to your side while in a flat bed without using bedrails?: None Help needed moving from lying on your back to sitting on the side of a flat bed without using bedrails?: None Help needed moving to and from a bed to a chair (including a wheelchair)?: A Little Help needed standing up from a chair using your arms (e.g., wheelchair or bedside chair)?: A Little Help needed to walk in hospital room?: A Lot Help needed climbing 3-5 steps with a railing? : A Lot 6 Click Score: 18    End of Session Equipment Utilized During Treatment: Gait belt Activity Tolerance: Patient limited by pain Patient left: with call bell/phone within reach;in bed;with bed alarm set Nurse Communication: Mobility status PT Visit Diagnosis: Unsteadiness on feet (R26.81);Muscle weakness (generalized) (M62.81)     Time: 2229-7989 PT Time Calculation (min) (ACUTE ONLY): 21 min  Charges:  $Therapeutic Exercise: 8-22 mins                     Hieu Herms B. Migdalia Dk PT, DPT Acute Rehabilitation Services Pager [phone] Office [phone]   [place] 02/19/2020, 12:47 PM

## 2020-02-20 ENCOUNTER — Encounter: Payer: Self-pay | Admitting: Internal Medicine

## 2020-02-20 ENCOUNTER — Non-Acute Institutional Stay (SKILLED_NURSING_FACILITY): Payer: Medicare Other | Admitting: Internal Medicine

## 2020-02-20 ENCOUNTER — Telehealth: Payer: Self-pay | Admitting: Hematology

## 2020-02-20 DIAGNOSIS — I5033 Acute on chronic diastolic (congestive) heart failure: Secondary | ICD-10-CM | POA: Diagnosis not present

## 2020-02-20 DIAGNOSIS — I4891 Unspecified atrial fibrillation: Secondary | ICD-10-CM

## 2020-02-20 DIAGNOSIS — I872 Venous insufficiency (chronic) (peripheral): Secondary | ICD-10-CM | POA: Diagnosis not present

## 2020-02-20 DIAGNOSIS — D696 Thrombocytopenia, unspecified: Secondary | ICD-10-CM

## 2020-02-20 DIAGNOSIS — E1321 Other specified diabetes mellitus with diabetic nephropathy: Secondary | ICD-10-CM

## 2020-02-20 DIAGNOSIS — R29818 Other symptoms and signs involving the nervous system: Secondary | ICD-10-CM

## 2020-02-20 DIAGNOSIS — R4189 Other symptoms and signs involving cognitive functions and awareness: Secondary | ICD-10-CM

## 2020-02-20 HISTORY — DX: Other symptoms and signs involving the nervous system: R29.818

## 2020-02-20 HISTORY — DX: Thrombocytopenia, unspecified: D69.6

## 2020-02-20 LAB — KAPPA/LAMBDA LIGHT CHAINS
Kappa free light chain: 49.7 mg/L — ABNORMAL HIGH (ref 3.3–19.4)
Kappa, lambda light chain ratio: 1.24 (ref 0.26–1.65)
Lambda free light chains: 40.1 mg/L — ABNORMAL HIGH (ref 5.7–26.3)

## 2020-02-20 LAB — PROTEIN ELECTROPHORESIS, SERUM
A/G Ratio: 1.2 (ref 0.7–1.7)
Albumin ELP: 2.9 g/dL (ref 2.9–4.4)
Alpha-1-Globulin: 0.4 g/dL (ref 0.0–0.4)
Alpha-2-Globulin: 0.7 g/dL (ref 0.4–1.0)
Beta Globulin: 0.8 g/dL (ref 0.7–1.3)
Gamma Globulin: 0.7 g/dL (ref 0.4–1.8)
Globulin, Total: 2.5 g/dL (ref 2.2–3.9)
M-Spike, %: 0.3 g/dL — ABNORMAL HIGH
Total Protein ELP: 5.4 g/dL — ABNORMAL LOW (ref 6.0–8.5)

## 2020-02-20 NOTE — Assessment & Plan Note (Addendum)
KN:[id number] , sodium restriction & Unna boots BMET will be monitored at the SNF.

## 2020-02-20 NOTE — Assessment & Plan Note (Signed)
Unna boots will be continued at the SNF under the supervision of the wound care nurse.  Outpatient follow-up clinic in the vein clinic will be arranged. It is anticipated that the ABIs will be reattempted ;results were incomplete during hospitalization due to pain.

## 2020-02-20 NOTE — Progress Notes (Signed)
NURSING HOME LOCATION:  [facility] ROOM NUMBER:  [id number]  CODE STATUS:  FULL CODE  PCP:  Forrestine Him, PA-C  [address] [place] [address]   This is a comprehensive admission note to [facility] performed on this date less than 30 days from date of admission. Included are preadmission medical/surgical history; reconciled medication list; family history; social history and comprehensive review of systems.  Corrections and additions to the records were documented. Comprehensive physical exam was also performed. Additionally a clinical summary was entered for each active diagnosis pertinent to this admission in the Problem List to enhance continuity of care.  HPI: Patient was hospitalized 5/24-5/31/2021 with acute on chronic heart failure in the context of venous insufficiency and acute on CKD.   He presented with significant bilateral lower extremity edema with clinical signs of hypervolemia.  Right lower extremity edema was worse than the left; but Ultrasound did not show DVT.  ABIs could not be completed satisfactorily because of pain in the lower extremities.  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  Initially arrangements were attempted for "hospital at home" participation with home Lasix; but patient declined this.   With aggressive IV diuresis he diuresed 6.9 L.  He was transitioned to his maintenance torsemide therapy prior to discharge. Unna boot was employed for the venous insufficiency. Outpatient referral to the Vein Clinic was to be pursued for further evaluation.  The edema has been complicated by AKI on CKD, but renal function did improve once IV diuresis was stopped.  Significant weeping was presnt at times with associated high risk for infection. Thrombocytopenia was documented and outpatient follow-up with hematology was scheduled for 02/29/2020. Allopurinol was discontinued because of the worsening renal function.  Uric acid was to be repeated as an  outpatient with reassessment of need for allopurinol therapy.  Past medical and surgical history: Includes history of gout, chronic pain syndrome, insulin-dependent diabetes, hypothyroidism, dyslipidemia, essential hypertension, vitamin D deficiency, and history of gallbladder disease.  Surgeries include total hip replacement.  Social history: Never smoked.  Family history: Noncontributory due to advanced age.   Review of systems: Responses are somewhat in question based on his mental status testing revealing significant dementia.  As noted this is in the context of his stating he had "Covid from November until March". Nasal swabs were negative 5/24 & 02/18/2020. He stated that he is "not feeling too good" because "my blood is all whacked out and not going back and forth".  He made the comment that "it goes down one leg but did not come back up the other".  He is anxious to see the vascular surgeon.  He stated that the fluid is "busting out my toes" and turning them "black".  He describes his right foot as burning. He states remotely he was diagnosed with sleep anea but refused to wear the CPAP because "it sucked out my lip".  He also made a derogatory comment about the technician's sexual orientation. He describes slight constipation with the passage of increased volumes of air when he tries to have a bowel movement. He denies any active bleeding dyscrasias.  Constitutional: No fever, significant weight change  Eyes: No redness, discharge, pain, vision change ENT/mouth: No nasal congestion, purulent discharge, earache, change in hearing, sore throat  Cardiovascular: No chest pain, palpitations, paroxysmal nocturnal dyspnea Respiratory: No cough, sputum production, hemoptysis, significant snoring, apnea Gastrointestinal: No heartburn, dysphagia, abdominal pain, nausea /vomiting, rectal bleeding, melena Genitourinary: No dysuria, hematuria,  pyuria, incontinence, nocturia Musculoskeletal: No joint  stiffness, joint swelling Neurologic: No dizziness, headache, syncope, seizures Psychiatric: No significant anxiety, depression, insomnia, anorexia Endocrine: No change in hair/skin/nails, excessive thirst, excessive hunger, excessive urination  Hematologic/lymphatic: No  lymphadenopathy, abnormal bleeding Allergy/immunology: No itchy/watery eyes, significant sneezing, urticaria, angioedema  Physical exam:  Pertinent or positive findings: He is well-groomed and has immaculate dentition.  Despite his denial of any active bleeding; dried blood was noted in the nares to a scant degree.  He was noted to have some difficulty with word retrieval.  Heart rhythm and rate are irregular with accentuation of the heart sounds.  A brief systolic murmur is noted.  Pedal pulses are decreased to palpation.  The feet are Ace wrapped.  There is hyperpigmentation above the Ace wraps as well as over the dorsum of the feet, especially on the right.  The large toenails are deformed and thickened.  There is marked fusiform changes in the knees.  General appearance: Adequately nourished; no acute distress, increased work of breathing is present.   Lymphatic: No lymphadenopathy about the head, neck, axilla. Eyes: No conjunctival inflammation or lid edema is present. There is no scleral icterus. Ears:  External ear exam shows no significant lesions or deformities.   Nose:  External nasal examination shows no deformity or inflammation. Nasal mucosa are pink and moist without lesions, exudates Oral exam: Lips and gums are healthy appearing.There is no oropharyngeal erythema or exudate. Neck:  No thyromegaly, masses, tenderness noted.    Heart:  No gallop,  click, rub.  Lungs:  without wheezes, rhonchi, rales, rubs. Abdomen: Bowel sounds are normal.  Abdomen is soft and nontender with no organomegaly, hernias, masses. GU: Deferred  Extremities:  No cyanosis, clubbing Neurologic exam:  Balance, Rhomberg, finger to nose  testing could not be completed due to clinical state Skin: Warm & dry w/o tenting.  See clinical summary under each active problem in the Problem List with associated updated therapeutic plan

## 2020-02-20 NOTE — Assessment & Plan Note (Addendum)
Neurocog reassessment by PCP indicated once stable at home.

## 2020-02-20 NOTE — Assessment & Plan Note (Addendum)
Rate controlled On lifelong Eliquis

## 2020-02-20 NOTE — Telephone Encounter (Signed)
Received a new hem referral from the hospital for thrombocytopenia. I received a referral from [facility] to schedule Eric [name]'s new pt appt w/ Dr. Irene Limbo on 6/10 at [place] for pt to arrive 15 minutes early.

## 2020-02-20 NOTE — Assessment & Plan Note (Signed)
Monitor for bleeding dyscrasias in the context of Eliquis for A. fib.

## 2020-02-20 NOTE — Assessment & Plan Note (Addendum)
02/12/2020 A1c 8.4% which is adequate control with age & advanced co-morbidities Glucoses at SNF has ranged from 151 in the afternoon to 182 fasting.

## 2020-02-20 NOTE — Patient Instructions (Signed)
See assessment and plan under each diagnosis in the problem list and acutely for this visit 

## 2020-02-26 ENCOUNTER — Encounter: Payer: Self-pay | Admitting: Adult Health

## 2020-02-26 ENCOUNTER — Non-Acute Institutional Stay (SKILLED_NURSING_FACILITY): Payer: Medicare Other | Admitting: Adult Health

## 2020-02-26 DIAGNOSIS — D696 Thrombocytopenia, unspecified: Secondary | ICD-10-CM | POA: Diagnosis not present

## 2020-02-26 DIAGNOSIS — I5033 Acute on chronic diastolic (congestive) heart failure: Secondary | ICD-10-CM | POA: Diagnosis not present

## 2020-02-26 DIAGNOSIS — E1122 Type 2 diabetes mellitus with diabetic chronic kidney disease: Secondary | ICD-10-CM

## 2020-02-26 DIAGNOSIS — I4891 Unspecified atrial fibrillation: Secondary | ICD-10-CM | POA: Diagnosis not present

## 2020-02-26 DIAGNOSIS — N1832 Chronic kidney disease, stage 3b: Secondary | ICD-10-CM

## 2020-02-26 DIAGNOSIS — I872 Venous insufficiency (chronic) (peripheral): Secondary | ICD-10-CM | POA: Diagnosis not present

## 2020-02-26 DIAGNOSIS — R5381 Other malaise: Secondary | ICD-10-CM

## 2020-02-26 DIAGNOSIS — E039 Hypothyroidism, unspecified: Secondary | ICD-10-CM

## 2020-02-26 DIAGNOSIS — E1121 Type 2 diabetes mellitus with diabetic nephropathy: Secondary | ICD-10-CM

## 2020-02-26 DIAGNOSIS — Z794 Long term (current) use of insulin: Secondary | ICD-10-CM

## 2020-02-26 NOTE — Progress Notes (Signed)
Location:  [place] Room Number: 314-B Place of Service:  SNF (31) Provider:  Durenda Age, DNP, FNP-BC  Patient Care Team: Eric [name] as PCP - General [facility] Medicine)  Extended Emergency Contact Information Primary Emergency Contact: [facility] Address: [address] [place], [place] [address] [name] of [place] Phone: [phone] Relation: Spouse  Code Status:  FULL CODE  Goals of care: Advanced Directive information Advanced Directives 02/12/2020  Does Patient Have a Medical Advance Directive? Yes  Type of Advance Directive Living will  Does patient want to make changes to medical advance directive? No - Patient declined     Chief Complaint  Patient presents with   Discharge Note    Patient is seen for discharge from SNF on 02/27/20    HPI:  Pt is a 79 y.o. male who is for discharge home with Home health PT, OT and Nurse for treatment to BLE.   He was admitted to [facility] on 02/19/20 post [facility] hospitalization 02/12/20 to 02/19/20 for acute on chronic heart failure. He presented to the hospital with significant lower extremity edema. He was given IV diuretics with net diuresis of 6.9 L. At discharge, he was transitioned back to home Torsemide. Vasular ultrasound was negative for DVT. ABIs were performed but he was not able to tolerate it due to pain, but TBI did appear low on the left. Bilateral lower legs had significant weeping and were wrapped with Unna boots. He has a chronic thrombocytopenia for unknown etiology. He was started on Keflex due to concern for cellulitis on the left lower leg. Platelet count became lower so Keflex was discontinued. Outpatient referral to hematology was done. He has a PMH of gout, chronic pain syndrome, IDDM, hypothyroidism, dyslipidemia, hypertension, vitamin D deficiency and history of gallbladder disease.  Patient was admitted to this facility for  short-term rehabilitation after the patient's recent hospitalization.  Patient has completed SNF rehabilitation and therapy has cleared the patient for discharge.   Past Medical History:  Diagnosis Date   Gall bladder disease 2020   History of right hip replacement    Pt stated 8-9 yrs ago   Past Surgical History:  Procedure Laterality Date   TOTAL HIP ARTHROPLASTY      Allergies  Allergen Reactions   Hydrocodone Nausea And Vomiting   Iohexol      Desc: "shuts kidneys down"    Morphine And Related    Sulfa Antibiotics    Sulfonamide Derivatives     Outpatient Encounter Medications as of 02/26/2020  Medication Sig   acetaminophen (TYLENOL) 325 MG tablet Take 650 mg by mouth every 6 (six) hours as needed.   Cholecalciferol (VITAMIN D) 50 MCG (2000 UT) CAPS Take 2,000 Units by mouth daily.   diltiazem (TIAZAC) 360 MG 24 hr capsule Take 360 mg by mouth daily.   ELIQUIS 5 MG TABS tablet Take 5 mg by mouth 2 (two) times daily. Taking once daily   ferrous sulfate 325 (65 FE) MG tablet Take 325 mg by mouth daily with breakfast.   insulin glargine, 1 Unit Dial, (TOUJEO SOLOSTAR) 300 UNIT/ML Solostar Pen Inject 46 Units into the skin daily.   insulin lispro (HUMALOG) 100 UNIT/ML KwikPen Inject 14 Units into the skin 3 (three) times daily.    levothyroxine (SYNTHROID) 100 MCG tablet Take 50-100 mcg by mouth See admin instructions. Take 100 mg daily except Sundays, on Sunday take 0.5  tablet to = 50 mg   Multiple Vitamins-Minerals (OCUVITE ADULT 50+) CAPS Take 1 each by mouth daily.   Probiotic Product (PROBIOTIC ACIDOPHILUS BEADS PO) Take 1 tablet by mouth daily.   silodosin (RAPAFLO) 8 MG CAPS capsule Take 8 mg by mouth daily.    simvastatin (ZOCOR) 20 MG tablet Take 20 mg by mouth at bedtime.   spironolactone (ALDACTONE) 50 MG tablet Take 50 mg by mouth daily.   torsemide (DEMADEX) 20 MG tablet Take 20 mg by mouth daily.   No facility-administered encounter  medications on file as of 02/26/2020.    Review of Systems  GENERAL: No change in appetite, no fatigue, no fever, chills or weakness MOUTH and THROAT: Denies oral discomfort, gingival pain or bleeding RESPIRATORY: no cough, SOB, DOE, wheezing, hemoptysis CARDIAC: No chest pain or palpitations GI: No abdominal pain, diarrhea, constipation, heart burn, nausea or vomiting GU: Denies dysuria, frequency, hematuria, incontinence, or discharge NEUROLOGICAL: Denies dizziness, syncope, numbness, or headache PSYCHIATRIC: Denies feelings of depression or anxiety. No report of hallucinations, insomnia, paranoia, or agitation   Immunization History  Administered Date(s) Administered   Influenza, High Dose Seasonal PF 07/10/2015   Influenza,inj,Quad PF,6+ Mos 06/18/2017, 08/08/2018, 05/15/2019   Pneumococcal Conjugate-13 10/03/2014   Pneumococcal Polysaccharide-23 09/21/2013   Td 03/10/2018   Tdap 09/09/2015   Pertinent  Health Maintenance Due  Topic Date Due   FOOT EXAM  Never done   OPHTHALMOLOGY EXAM  Never done   URINE MICROALBUMIN  Never done   INFLUENZA VACCINE  04/21/2020   HEMOGLOBIN A1C  08/14/2020   PNA vac Low Risk Adult  Completed    Vitals:   02/26/20 1538  BP: 128/72  Pulse: 72  Resp: 18  Temp: (!) 97.3 F (36.3 C)  TempSrc: Oral  Weight: 234 lb (106.1 kg)  Height: 6' (1.829 m)   Body mass index is 31.74 kg/m.  Physical Exam  GENERAL APPEARANCE: Well nourished. In no acute distress. Obese SKIN:  Bilateral lower legs wrapped with Unna boots MOUTH and THROAT: Lips are without lesions. Oral mucosa is moist and without lesions. Tongue is normal in shape, size, and color and without lesions RESPIRATORY: Breathing is even & unlabored, BS CTAB CARDIAC:  no murmur,no extra heart sounds GI: Abdomen soft, normal BS, no masses, no tenderness EXTREMITIES:  Able to move X 4 extremities NEUROLOGICAL: There is no tremor. Speech is clear. Alert and oriented X  3. PSYCHIATRIC:  Affect and behavior are appropriate   Labs reviewed: Recent Labs    02/17/20 0828 02/18/20 0611 02/19/20 0424  NA 134* 136 138  K 4.7 4.6 4.3  CL 103 105 108  CO2 20* 22 20*  GLUCOSE 244* 136* 109*  BUN 87* 86* 82*  CREATININE 2.63* 2.47* 2.45*  CALCIUM 8.6* 8.3* 8.3*   Recent Labs    02/12/20 0357 02/17/20 0828  AST 22 15  ALT 34 21  ALKPHOS 108 108  BILITOT 2.0* 1.7*  PROT 6.6 6.1*  ALBUMIN 4.2 3.1*   Recent Labs    02/12/20 0357 02/14/20 0448 02/17/20 0833 02/18/20 0611 02/19/20 0424  WBC 5.9   < > 5.6 4.4 4.4  NEUTROABS 4.5  --   --   --   --   HGB 9.9*   < > 8.2* 7.6* 7.3*  HCT 29.2*   < > 24.7* 22.9* 22.2*  MCV 91.0   < > 90.8 90.9 89.9  PLT 88*   < > 102* 101* 102*   < > =  values in this interval not displayed.   Lab Results  Component Value Date   TSH (H) 12/19/2009    5.553    Lab Results  Component Value Date   HGBA1C 8.4 (H) 02/12/2020   Significant Diagnostic Results in last 30 days:  DG Chest 2 View  Result Date: 02/12/2020 CLINICAL DATA:  Shortness of breath EXAM: CHEST - 2 VIEW COMPARISON:  08/07/2019 FINDINGS: Small right pleural effusion which was also seen on prior. Cardiomegaly. There is no edema, consolidation, or pneumothorax. IMPRESSION: Cardiomegaly and small right pleural effusion, which was also seen on a 2020 chest x-ray. Electronically Signed   By: Monte Fantasia M.D.   On: 02/12/2020 04:23   VAS [place] ABI WITH/WO TBI  Result Date: 02/15/2020 LOWER EXTREMITY DOPPLER STUDY Indications: Venous stasis.  Performing Technologist: June Leap Rvt, Rdms  Examination Guidelines: A complete evaluation includes at minimum, Doppler waveform signals and systolic blood pressure reading at the level of bilateral brachial, anterior tibial, and posterior tibial arteries, when vessel segments are accessible. Bilateral testing is considered an integral part of a complete examination. Photoelectric Plethysmograph (PPG) waveforms  and toe systolic pressure readings are included as required and additional duplex testing as needed. Limited examinations for reoccurring indications may be performed as noted.  ABI Findings: +---------+------------------+-----+---------+--------+  Right     Rt Pressure (mmHg) Index Waveform  Comment   +---------+------------------+-----+---------+--------+  Brachial  141                      triphasic           +---------+------------------+-----+---------+--------+  ATA                                triphasic           +---------+------------------+-----+---------+--------+  PTA                                triphasic           +---------+------------------+-----+---------+--------+  Great Toe 105                0.74  Normal              +---------+------------------+-----+---------+--------+ +---------+------------------+-----+---------+-------+  Left      Lt Pressure (mmHg) Index Waveform  Comment  +---------+------------------+-----+---------+-------+  Brachial  137                      triphasic          +---------+------------------+-----+---------+-------+  ATA                                triphasic          +---------+------------------+-----+---------+-------+  PTA                                triphasic          +---------+------------------+-----+---------+-------+  Great Toe 88                 0.62  Abnormal           +---------+------------------+-----+---------+-------+  Summary:  Normal pedal artery waveforms. Unable to obtain blood pressures for Index due to weeping skin/ patient pain level. Normal Right  TBI, Abnormal Left TBI.  *See table(s) above for measurements and observations.  Electronically signed by Servando Snare MD on 02/15/2020 at 3:33:54 PM.   Final    ECHOCARDIOGRAM COMPLETE  Result Date: 02/13/2020    ECHOCARDIOGRAM REPORT   Patient Name:   Eric [name] Date of Exam: 02/13/2020 Medical Rec #:  [id number]   Height:       72.0 in Accession #:    [id number]  Weight:       252.1 lb  Date of Birth:  [birth year]   BSA:          2.351 m Patient Age:    77 years    BP:           170/79 mmHg Patient Gender: M           HR:           72 bpm. Exam Location:  Inpatient Procedure: 2D Echo, Cardiac Doppler and Color Doppler Indications:    CHF-Acute Diastolic  History:        Patient has prior history of Echocardiogram examinations, most                 recent 11/23/2006. CHF, Arrythmias:Atrial Fibrillation; Risk                 Factors:Diabetes and Hypertension.  Sonographer:    Clayton Lefort RDCS (AE) Referring Phys: [id number] [place]  1. Left ventricular ejection fraction, by estimation, is 60 to 65%. The left ventricle has normal function. The left ventricle has no regional wall motion abnormalities. Left ventricular diastolic parameters are indeterminate.  2. Right ventricular systolic function is normal. The right ventricular size is mildly enlarged. Tricuspid regurgitation signal is inadequate for assessing PA pressure.  3. Left atrial size was severely dilated.  4. Right atrial size was moderately dilated.  5. The mitral valve is normal in structure. Trivial mitral valve regurgitation. No evidence of mitral stenosis.  6. The aortic valve is tricuspid. Aortic valve regurgitation is not visualized. Mild aortic valve stenosis. Aortic valve area, by VTI measures 1.71 cm. Aortic valve mean gradient measures 18.0 mmHg.  7. The inferior vena cava is dilated in size with <50% respiratory variability, suggesting right atrial pressure of 15 mmHg.  8. Small pericardial effusion.  9. The patient was in atrial fibrillation. FINDINGS  Left Ventricle: Left ventricular ejection fraction, by estimation, is 60 to 65%. The left ventricle has normal function. The left ventricle has no regional wall motion abnormalities. The left ventricular internal cavity size was normal in size. There is  no left ventricular hypertrophy. Left ventricular diastolic parameters are indeterminate. Right Ventricle:  The right ventricular size is mildly enlarged. No increase in right ventricular wall thickness. Right ventricular systolic function is normal. Tricuspid regurgitation signal is inadequate for assessing PA pressure. Left Atrium: Left atrial size was severely dilated. Right Atrium: Right atrial size was moderately dilated. Pericardium: A small pericardial effusion is present. Mitral Valve: The mitral valve is normal in structure. Mild mitral annular calcification. Trivial mitral valve regurgitation. No evidence of mitral valve stenosis. Tricuspid Valve: The tricuspid valve is normal in structure. Tricuspid valve regurgitation is trivial. Aortic Valve: The aortic valve is tricuspid. Aortic valve regurgitation is not visualized. Mild aortic stenosis is present. Aortic valve mean gradient measures 18.0 mmHg. Aortic valve peak gradient measures 31.0 mmHg. Aortic valve area, by VTI measures 1.71 cm. Pulmonic Valve: The pulmonic valve was normal in structure. Pulmonic valve regurgitation is  not visualized. Aorta: The aortic root is normal in size and structure. Venous: The inferior vena cava is dilated in size with less than 50% respiratory variability, suggesting right atrial pressure of 15 mmHg. IAS/Shunts: No atrial level shunt detected by color flow Doppler.  LEFT VENTRICLE PLAX 2D LVIDd:         4.98 cm LVIDs:         3.35 cm LV PW:         1.34 cm LV IVS:        1.27 cm LVOT diam:     2.00 cm LV SV:         90 LV SV Index:   38 LVOT Area:     3.14 cm  RIGHT VENTRICLE             IVC RV Basal diam:  4.92 cm     IVC diam: 2.24 cm RV Mid diam:    3.56 cm RV S prime:     16.60 cm/s TAPSE (M-mode): 1.8 cm LEFT ATRIUM              Index       RIGHT ATRIUM           Index LA diam:        4.90 cm  2.08 cm/m  RA Area:     28.10 cm LA Vol (A2C):   159.0 ml 67.64 ml/m RA Volume:   97.20 ml  41.35 ml/m LA Vol (A4C):   133.0 ml 56.58 ml/m LA Biplane Vol: 146.0 ml 62.11 ml/m  AORTIC VALVE AV Area (Vmax):    1.69 cm AV Area  (Vmean):   1.79 cm AV Area (VTI):     1.71 cm AV Vmax:           278.40 cm/s AV Vmean:          192.800 cm/s AV VTI:            0.529 m AV Peak Grad:      31.0 mmHg AV Mean Grad:      18.0 mmHg LVOT Vmax:         150.00 cm/s LVOT Vmean:        109.600 cm/s LVOT VTI:          0.288 m LVOT/AV VTI ratio: 0.54  AORTA Ao Root diam: 3.20 cm Ao Asc diam:  3.10 cm  SHUNTS Systemic VTI:  0.29 m Systemic Diam: 2.00 cm Loralie Champagne MD Electronically signed by Loralie Champagne MD Signature Date/Time: 02/13/2020/1:46:01 PM    Final    VAS [place] LOWER EXTREMITY VENOUS (DVT)  Result Date: 02/15/2020  Lower Venous DVTStudy Indications: Venous stasis, leg pain.  Comparison Study: none Performing Technologist: June Leap RDMS, RVT  Examination Guidelines: A complete evaluation includes B-mode imaging, spectral Doppler, color Doppler, and power Doppler as needed of all accessible portions of each vessel. Bilateral testing is considered an integral part of a complete examination. Limited examinations for reoccurring indications may be performed as noted. The reflux portion of the exam is performed with the patient in reverse Trendelenburg.  +---------+---------------+---------+-----------+----------+--------------+  RIGHT     Compressibility Phasicity Spontaneity Properties Thrombus Aging  +---------+---------------+---------+-----------+----------+--------------+  CFV       Full            Yes       Yes                                    +---------+---------------+---------+-----------+----------+--------------+  SFJ       Full                                                             +---------+---------------+---------+-----------+----------+--------------+  FV Prox   Full                                                             +---------+---------------+---------+-----------+----------+--------------+  FV Mid    Full                                                              +---------+---------------+---------+-----------+----------+--------------+  FV Distal Full                                                             +---------+---------------+---------+-----------+----------+--------------+  PFV       Full                                                             +---------+---------------+---------+-----------+----------+--------------+  POP       Full            Yes       Yes                                    +---------+---------------+---------+-----------+----------+--------------+  PTV                                                        Not visualized  +---------+---------------+---------+-----------+----------+--------------+  PERO                                                       Not visualized  +---------+---------------+---------+-----------+----------+--------------+     Summary: RIGHT: - There is no evidence of deep vein thrombosis in the lower extremity. However, portions of this examination were limited- see technologist comments above.  - No cystic structure found in the popliteal fossa. - Ultrasound characteristics of enlarged lymph nodes are noted in the groin.   *See table(s) above for measurements and observations. Electronically signed by Servando Snare MD on 02/15/2020 at 3:34:01 PM.  Final     Assessment/Plan  1. Acute on chronic diastolic heart failure ([place]) - continue Spironolactone and Torsemide  2. Venous insufficiency - presented to the hospital with significant edema of lower extremities -  Legs wrapped with Unna boots - will have Home health Nurse to do leg wraps  3. Thrombocytopenia ([place]) Lab Results  Component Value Date   PLT 102 (L) 02/19/2020  - was referred to hematology   4. Atrial fibrillation, unspecified type ([place]) - rate-controlled, continue Eliquis for anticoagulation and Diltiazem 24 HR for rate-control  5. Hypothyroidism, unspecified type Lab Results  Component Value Date   TSH (H) 12/19/2009     5.553    - continue Levothyroxine  6. Type 2 diabetes mellitus with stage 3b chronic kidney disease, with long-term current use of insulin ([name]) Lab Results  Component Value Date   HGBA1C 8.4 (H) 02/12/2020   - continue Toujeo, Insulin Lispro and CBG checks   7. Physical deconditioning - for Home health PT and OT, for therapeutic strengthening exercises    Discussed medications and he verbalized that he has all of them at home and does not need any refill.   I have filled out patient's discharge paperwork. Patient will have home health PT, OT and Nursing for treatment.   Treatment: Wash bilateral lower extremity, dry.  Apply Unna boots and coban.  Changed weekly  DME provided: None  Total discharge time: Greater than 30 minutes Greater than 80% was spent in counseling and coordination of care.  Discharge time involved coordination of the discharge process with social worker, nursing staff and therapy department. Medical justification for home health services verified.    Durenda Age, DNP, FNP-BC [facility] [phone] (Monday-Friday 8:00 a.m. - 5:00 p.m.) [phone] (after hours)

## 2020-02-28 ENCOUNTER — Telehealth: Payer: Self-pay | Admitting: Hematology

## 2020-02-28 NOTE — Telephone Encounter (Signed)
[name] called to cancel his appt w/Dr. Irene Limbo on 6/10. Pt has chosen not to reschedule his appt.

## 2020-02-29 ENCOUNTER — Inpatient Hospital Stay: Payer: Medicare Other

## 2020-02-29 ENCOUNTER — Inpatient Hospital Stay: Payer: Medicare Other | Admitting: Hematology

## 2020-03-08 ENCOUNTER — Encounter (HOSPITAL_COMMUNITY): Payer: Self-pay | Admitting: *Deleted

## 2020-03-08 ENCOUNTER — Other Ambulatory Visit: Payer: Self-pay

## 2020-03-08 ENCOUNTER — Emergency Department (HOSPITAL_COMMUNITY)
Admission: EM | Admit: 2020-03-08 | Discharge: 2020-03-08 | Disposition: A | Discharge status: Home / Self Care | Payer: Medicare Other | Attending: Emergency Medicine | Admitting: Emergency Medicine

## 2020-03-08 DIAGNOSIS — N183 Chronic kidney disease, stage 3 unspecified: Secondary | ICD-10-CM

## 2020-03-08 DIAGNOSIS — I130 Hypertensive heart and chronic kidney disease with heart failure and stage 1 through stage 4 chronic kidney disease, or unspecified chronic kidney disease: Secondary | ICD-10-CM | POA: Diagnosis not present

## 2020-03-08 DIAGNOSIS — E875 Hyperkalemia: Secondary | ICD-10-CM

## 2020-03-08 DIAGNOSIS — I5032 Chronic diastolic (congestive) heart failure: Secondary | ICD-10-CM | POA: Insufficient documentation

## 2020-03-08 DIAGNOSIS — Z96641 Presence of right artificial hip joint: Secondary | ICD-10-CM | POA: Diagnosis not present

## 2020-03-08 DIAGNOSIS — D649 Anemia, unspecified: Secondary | ICD-10-CM | POA: Diagnosis not present

## 2020-03-08 DIAGNOSIS — Z79899 Other long term (current) drug therapy: Secondary | ICD-10-CM | POA: Diagnosis not present

## 2020-03-08 DIAGNOSIS — R600 Localized edema: Secondary | ICD-10-CM | POA: Insufficient documentation

## 2020-03-08 DIAGNOSIS — Z794 Long term (current) use of insulin: Secondary | ICD-10-CM | POA: Diagnosis not present

## 2020-03-08 LAB — COMPREHENSIVE METABOLIC PANEL
ALT: 15 U/L (ref 0–44)
AST: 32 U/L (ref 15–41)
Albumin: 3.6 g/dL (ref 3.5–5.0)
Alkaline Phosphatase: 142 U/L — ABNORMAL HIGH (ref 38–126)
Anion gap: 8 (ref 5–15)
BUN: 55 mg/dL — ABNORMAL HIGH (ref 8–23)
CO2: 23 mmol/L (ref 22–32)
Calcium: 8.2 mg/dL — ABNORMAL LOW (ref 8.9–10.3)
Chloride: 102 mmol/L (ref 98–111)
Creatinine, Ser: 2.38 mg/dL — ABNORMAL HIGH (ref 0.61–1.24)
GFR calc Af Amer: 29 mL/min — ABNORMAL LOW (ref >60)
GFR calc non Af Amer: 25 mL/min — ABNORMAL LOW (ref >60)
Glucose, Bld: 311 mg/dL — ABNORMAL HIGH (ref 70–99)
Potassium: 6.4 mmol/L (ref 3.5–5.1)
Sodium: 133 mmol/L — ABNORMAL LOW (ref 135–145)
Total Bilirubin: 1.7 mg/dL — ABNORMAL HIGH (ref 0.3–1.2)
Total Protein: 7.1 g/dL (ref 6.5–8.1)

## 2020-03-08 LAB — CBC WITH DIFFERENTIAL/PLATELET
Abs Immature Granulocytes: 0.03 10*3/uL (ref 0.00–0.07)
Basophils Absolute: 0 10*3/uL (ref 0.0–0.1)
Basophils Relative: 0 %
Eosinophils Absolute: 0.1 10*3/uL (ref 0.0–0.5)
Eosinophils Relative: 1 %
HCT: 23 % — ABNORMAL LOW (ref 39.0–52.0)
Hemoglobin: 7 g/dL — ABNORMAL LOW (ref 13.0–17.0)
Immature Granulocytes: 1 %
Lymphocytes Relative: 14 %
Lymphs Abs: 0.9 10*3/uL (ref 0.7–4.0)
MCH: 29.3 pg (ref 26.0–34.0)
MCHC: 30.4 g/dL (ref 30.0–36.0)
MCV: 96.2 fL (ref 80.0–100.0)
Monocytes Absolute: 0.6 10*3/uL (ref 0.1–1.0)
Monocytes Relative: 10 %
Neutro Abs: 4.5 10*3/uL (ref 1.7–7.7)
Neutrophils Relative %: 74 %
Platelets: 141 10*3/uL — ABNORMAL LOW (ref 150–400)
RBC: 2.39 MIL/uL — ABNORMAL LOW (ref 4.22–5.81)
RDW: 18 % — ABNORMAL HIGH (ref 11.5–15.5)
WBC: 6 10*3/uL (ref 4.0–10.5)
nRBC: 0 % (ref 0.0–0.2)

## 2020-03-08 LAB — TYPE AND SCREEN
ABO/RH(D): O POS
Antibody Screen: NEGATIVE

## 2020-03-08 LAB — POC OCCULT BLOOD, ED: Fecal Occult Bld: NEGATIVE

## 2020-03-08 LAB — BRAIN NATRIURETIC PEPTIDE: B Natriuretic Peptide: 180.7 pg/mL — ABNORMAL HIGH (ref 0.0–100.0)

## 2020-03-08 LAB — ABO/RH: ABO/RH(D): O POS

## 2020-03-08 LAB — POTASSIUM: Potassium: 5.8 mmol/L — ABNORMAL HIGH (ref 3.5–5.1)

## 2020-03-08 MED ORDER — SODIUM ZIRCONIUM CYCLOSILICATE 10 G PO PACK
10.0000 g | PACK | Freq: Once | ORAL | Status: AC
  Administered 2020-03-08: 10 g via ORAL
  Filled 2020-03-08: qty 1

## 2020-03-08 NOTE — ED Provider Notes (Signed)
[facility] DEPT Provider Note   CSN: [id number] Arrival date & time: 03/08/20  1716     History Chief Complaint  Patient presents with  . low hgb    Eric [name] is a 79 y.o. male.  Patient is a 79 year old male with a history of chronic diastolic heart failure, chronic kidney disease, diabetes, obesity, atrial fibrillation on Eliquis who has thrombocytopenia and anemia that has been gradually worsening over the last few months from 10 a few months ago down to 7 and 8 during his most recent hospitalization who is presenting today after being called by the doctor and reporting that his hemoglobin was low.  The lab was drawn on Tuesday but results did not get to his doctor till today.  Patient reports since he has been home things have been improving.  His weight has been stable, his leg swelling has improved and his stamina is improving.  He states he will get dizzy occasionally but nothing regularly and reports his oxygen levels have been good at home but he still gets winded when he gets in the heat and starts walking.  He does intermittently notice some blood when he wipes from hemorrhoids but denies significant change in stool color.  He also had recently been started on iron and was supposed to follow-up with a hematologist but was feeling poorly and canceled the appointment.  His doctor did talk to them about what sounds like potential Epogen shots but patient reported he was not interested based on the side effects he read about.  He states overall things have been getting better and he has no specific complaints today.  He has never required a blood transfusion in the past.  The history is provided by the patient.       Past Medical History:  Diagnosis Date  . Gall bladder disease 2020  . History of right hip replacement    Pt stated 8-9 yrs ago    Patient Active Problem List   Diagnosis Date Noted  . Thrombocytopenia ([place]) 02/20/2020  .  Neurocognitive deficits 02/20/2020  . Venous insufficiency 02/19/2020  . Cellulitis of lower extremity   . Acute on chronic diastolic heart failure ([place]) 02/12/2020  . DIASTOLIC HEART FAILURE, CHRONIC 02/07/2009  . 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 OBESITY 02/06/2009  . Essential hypertension, benign 02/06/2009  . Atrial fibrillation ([place]) 01/15/2009    Past Surgical History:  Procedure Laterality Date  . APPENDECTOMY    . CHOLECYSTECTOMY    . TOTAL HIP ARTHROPLASTY         No family history on file.  Social History   Tobacco Use  . Smoking status: Never Smoker  . Smokeless tobacco: Never Used  Substance Use Topics  . Alcohol use: Never  . Drug use: Never    Home Medications Prior to Admission medications   Medication Sig Start Date End Date Taking? Authorizing Provider  acetaminophen (TYLENOL) 325 MG tablet Take 650 mg by mouth every 6 (six) hours as needed for moderate pain.    Yes [provider]  allopurinol (ZYLOPRIM) 300 MG tablet Take 300 mg by mouth daily.   Yes [provider]  diltiazem (TIAZAC) 360 MG 24 hr capsule Take 360 mg by mouth daily. 11/27/19  Yes [provider]  ELIQUIS 5 MG TABS tablet Take 5 mg by mouth 2 (two) times daily. Taking once daily 01/14/20  Yes [provider]  insulin glargine, 1 Unit Dial, (TOUJEO  SOLOSTAR) 300 UNIT/ML Solostar Pen Inject 46 Units into the skin daily. 08/31/15  Yes [provider]  insulin lispro (HUMALOG) 100 UNIT/ML KwikPen Inject 20 Units into the skin 3 (three) times daily.  04/15/15  Yes [provider]  levothyroxine (SYNTHROID) 100 MCG tablet Take 50-100 mcg by mouth See admin instructions. Take 100 mg daily except Sundays, on Sunday take 0.5 tablet to = 50 mg 01/14/20  Yes [provider]  silodosin (RAPAFLO) 8 MG CAPS capsule Take 8 mg by mouth daily.  12/13/19  Yes [provider]  simvastatin (ZOCOR) 20 MG tablet  Take 20 mg by mouth at bedtime. 11/27/19  Yes [provider]  spironolactone (ALDACTONE) 50 MG tablet Take 50 mg by mouth daily. 11/27/19  Yes [provider]  torsemide (DEMADEX) 20 MG tablet Take 20 mg by mouth daily. 08/31/19  Yes [provider]    Allergies    Hydrocodone, Iohexol, Morphine and related, Sulfa antibiotics, and Sulfonamide derivatives  Review of Systems   Review of Systems  All other systems reviewed and are negative.   Physical Exam Updated Vital Signs BP (!) 148/53 (BP Location: Right Arm)   Pulse 61   Temp 97.6 F (36.4 C) (Oral)   Resp 17   Wt 108.9 kg   SpO2 100%   BMI 32.55 kg/m   Physical Exam Vitals and nursing note reviewed.  Constitutional:      General: He is not in acute distress.    Appearance: He is well-developed.  HENT:     Head: Normocephalic and atraumatic.     Mouth/Throat:     Mouth: Mucous membranes are moist.  Eyes:     Conjunctiva/sclera: Conjunctivae normal.     Pupils: Pupils are equal, round, and reactive to light.  Cardiovascular:     Rate and Rhythm: Normal rate. Rhythm irregularly irregular.     Pulses: Normal pulses.     Heart sounds: No murmur heard.   Pulmonary:     Effort: Pulmonary effort is normal. No respiratory distress.     Breath sounds: Normal breath sounds. No wheezing or rales.  Abdominal:     General: There is no distension.     Palpations: Abdomen is soft.     Tenderness: There is no abdominal tenderness. There is no guarding or rebound.  Genitourinary:    Comments: No notable hemorrhoids present.  Stool is brown Musculoskeletal:        General: No tenderness. Normal range of motion.     Cervical back: Normal range of motion and neck supple.     Right lower leg: Edema present.     Left lower leg: Edema present.     Comments: Pitting edema and skin changes consistent with venous stasis present on both bilateral lower extremities  Skin:    General: Skin is warm and dry.      Findings: No erythema or rash.  Neurological:     General: No focal deficit present.     Mental Status: He is alert and oriented to person, place, and time. Mental status is at baseline.  Psychiatric:        Mood and Affect: Mood normal.        Behavior: Behavior normal.        Thought Content: Thought content normal.     ED Results / Procedures / Treatments   Labs (all labs ordered are listed, but only abnormal results are displayed) Labs Reviewed  CBC WITH DIFFERENTIAL/PLATELET -  Abnormal; Notable for the following components:      Result Value   RBC 2.39 (*)    Hemoglobin 7.0 (*)    HCT 23.0 (*)    RDW 18.0 (*)    Platelets 141 (*)    All other components within normal limits  BRAIN NATRIURETIC PEPTIDE - Abnormal; Notable for the following components:   B Natriuretic Peptide 180.7 (*)    All other components within normal limits  COMPREHENSIVE METABOLIC PANEL - Abnormal; Notable for the following components:   Sodium 133 (*)    Potassium 6.4 (*)    Glucose, Bld 311 (*)    BUN 55 (*)    Creatinine, Ser 2.38 (*)    Calcium 8.2 (*)    Alkaline Phosphatase 142 (*)    Total Bilirubin 1.7 (*)    GFR calc non Af Amer 25 (*)    GFR calc Af Amer 29 (*)    All other components within normal limits  POTASSIUM - Abnormal; Notable for the following components:   Potassium 5.8 (*)    All other components within normal limits  POC OCCULT BLOOD, ED  TYPE AND SCREEN    EKG None  Radiology No results found.  Procedures Procedures (including critical care time)  Medications Ordered in ED Medications - No data to display  ED Course  I have reviewed the triage vital signs and the nursing notes.  Pertinent labs & imaging results that were available during my care of the patient were reviewed by me and considered in my medical decision making (see chart for details).    MDM Rules/Calculators/A&P                          Patient presenting today because his doctor told  him to come to the emergency room because his hemoglobin was 6.9.  Patient reports no new symptoms and states overall things have been getting better including the swelling and his weight has remained stable.  He has had no recent medication changes but does take Eliquis.  He states he occasionally will have some blood on the toilet paper from hemorrhoids but has not noticed any recently.  Patient has never required a blood transfusion but was told that he should get shots to help boost his blood counts which he reported he is not interested in.  Patient's hemoglobin here on 02/19/2020 during his most recent hospitalization was 7.3 which is not significantly different from the test done today which was 7.0.  Discussed with the patient that it does not appear that he needs a blood transfusion today however it is very important for him to follow-up with hematology for this anemia.  10:16 PM Patient's renal function is unchanged from baseline at 2.38 which most recent sick creatinine was from 2 weeks ago and it was 2.45.  Patient's potassium today was elevated at 6.8 however suspect this is hemolysis.  On repeat evaluation it is 5.8.  Patient does not take potassium at home.  He is taking spironolactone and torsemide at home but those have been ongoing medication since he left the hospital.  He seems to be tolerating these well and BNP is improved from prior.  Patient does see Dr. With the resident service and is being followed closely.  Will have patient follow-up for repeat potassium checks and he may need to adjust his spironolactone.  However with him otherwise looking well today feel that he can go home and  follow-up as an outpatient.  He was given dose of lokelma.  Also he was encouraged to follow-up with the hematologist about his ongoing anemia but Hemoccult here today was negative.  MDM Number of Diagnoses or Management Options   Amount and/or Complexity of Data Reviewed Clinical lab tests: ordered and  reviewed Decide to obtain previous medical records or to obtain history from someone other than the patient: yes Obtain history from someone other than the patient: no Review and summarize past medical records: yes Discuss the patient with other providers: no Independent visualization of images, tracings, or specimens: yes  Risk of Complications, Morbidity, and/or Mortality Presenting problems: low Diagnostic procedures: low Management options: low  Patient Progress Patient progress: stable   Final Clinical Impression(s) / ED Diagnoses Final diagnoses:  Anemia, unspecified type  Stage 3 chronic kidney disease, unspecified whether stage 3a or 3b CKD  Hyperkalemia    Rx / DC Orders ED Discharge Orders    None       Blanchie Dessert, MD 03/08/20 2226

## 2020-03-08 NOTE — Discharge Instructions (Signed)
Your potassium was a little bit high today but your kidney function looked good, there was no sign of blood in your stool and your blood counts have not changed significantly they were 7.0 and when you left 2 weeks ago they were 7.3.  However your blood counts have been trending down and it will be important for you to follow-up with the hematologist at the cancer center so you also need to call them on Monday for a follow-up appointment.

## 2020-03-08 NOTE — ED Triage Notes (Signed)
Sent by PCP due to hgb 6.9 on Tuesday.

## 2020-03-08 NOTE — ED Notes (Signed)
CRITICAL VALUE STICKER  CRITICAL VALUE: K+ 6.4  RECEIVER (on-site recipient of call): Jake T RN  DATE & TIME NOTIFIED: 03/08/20  904p  MESSENGER (representative from lab):  Rikki Spearing  MD NOTIFIED: Maryan Rued, MD  TIME OF NOTIFICATION: 905p  RESPONSE: see orders

## 2020-03-12 ENCOUNTER — Telehealth: Payer: Self-pay | Admitting: Hematology and Oncology

## 2020-03-12 NOTE — Telephone Encounter (Signed)
Received a new hem referral from [facility], [place] for thrombocytopenia. [name] has been cld and scheduled to see Dr. Lorenso Courier on 6/30 at 2pm. Pt aware to arrive 15 minutes early.

## 2020-03-19 ENCOUNTER — Telehealth: Payer: Self-pay

## 2020-03-19 NOTE — Progress Notes (Signed)
[place] [phone]   Fax:([phone] [facility] NOTE  Patient Care Team: Forrestine Him, Hershal Coria as PCP - General (Family Medicine)  Hematological/Oncological History # Thrombocytopenia # Normocytic Anemia 1) 02/12/2020: WBC 5.9, Hgb 9.9, MCV 91, Plt 88 2) 02/19/2020: WBC 4.4, Hgb 7.3, MCV 89.9, Plt 102 3) 03/08/2020: WBC 6.0, Hgb 7.0, Plt 141, MCV 96.2 4) 03/20/2020: establish care with Dr. Lorenso Courier    CHIEF COMPLAINTS/PURPOSE OF CONSULTATION:  "Thrombocytopenia "  HISTORY OF PRESENTING ILLNESS:  Eric [name] 79 y.o. male with medical history significant for chronic diastolic heart failure, chronic kidney disease, diabetes, obesity, atrial fibrillation on Eliquis who presents for evaluation of her hematological abnormalities.   On review of the previous records Eric [name] has a longstanding history of anemia dating back to at least 06/23/2007.  At that time he was noted to have a white blood cell count of 5.5, hemoglobin 11.4, MCV of 92.7, and a platelet count of 167.  On 12/20/2009 the patient was found to have white blood cell count of 5.6, hemoglobin 10.2, and a platelet count of 139.  In May 2021 the patient was admitted for acute on chronic CKD as well as systolic heart failure.  At that time he was noted to have white blood cell count of 5.9, hemoglobin 9.9, and a platelet count of 88 with an MCV of 91.  Most recently on 03/08/2020 the patient was seen in the emergency department and found to have white blood cell count of 6.0, hemoglobin 7.0, MCV of 96.2, and a platelet count of 141.  His creatinine at that time was at his baseline of approximately 2.4.  Due to concern for this patient's thrombocytopenia and anemia he was referred to hematology for further evaluation management.  On exam today the patient notes that he has had a rough couple of months.  He reports that he has been anemic for some time but he is not entirely sure when he was first told.  He  notes that it was most apparent within the last several months during his last hospitalization.  He recently had a CBC with a hemoglobin of 6.9 and was told to present the emergency department but when arrived to the ED he was told he had a hemoglobin 7.1 and therefore he did not require transfusion support.  The patient notes that he is somewhat symptomatic from his low hemoglobin with low levels of energy and shortness of breath on exertion.  He does not move around very much and therefore very rarely becomes truly short of breath.  On further discussion he notes that he does occasionally have nosebleeds and that he had cauterization of his bleeding vessels long ago.  He had a whole colonoscopy performed in 2019 with no clear sources of bleeding reported identified at that time.  The patient notes that he was a former smoker and quit in 1992 after smoking since the age of 72.  Additionally he also quit alcohol at that time.  The patient reports that he is an avid meat eater and eats very little in the way of fruits and vegetables.  He reports that he predominantly eats red meat and that he does suffer from constipation as result of this diet.  He notes that whenever he takes medications to help relieve his constipation he ends up with uncontrollable diarrhea.  He currently denies having any issues with fevers, chills, sweats, nausea, vomiting or diarrhea.  A full 10 point ROS is listed  below.  MEDICAL HISTORY:  Past Medical History:  Diagnosis Date  . Gall bladder disease 2020  . History of right hip replacement    Pt stated 8-9 yrs ago    SURGICAL HISTORY: Past Surgical History:  Procedure Laterality Date  . APPENDECTOMY    . CHOLECYSTECTOMY    . TOTAL HIP ARTHROPLASTY      SOCIAL HISTORY: Social History   Socioeconomic History  . Marital status: Married    Spouse name: Not on file  . Number of children: Not on file  . Years of education: Not on file  . Highest education level: Not on  file  Occupational History  . Not on file  Tobacco Use  . Smoking status: Never Smoker  . Smokeless tobacco: Never Used  Substance and Sexual Activity  . Alcohol use: Never  . Drug use: Never  . Sexual activity: Yes    Partners: Female  Other Topics Concern  . Not on file  Social History Narrative  . Not on file   Social Determinants of Health   Financial Resource Strain:   . Difficulty of Paying Living Expenses:   Food Insecurity:   . Worried About Charity fundraiser in the Last Year:   . Arboriculturist in the Last Year:   Transportation Needs:   . Film/video editor (Medical):   Marland Kitchen Lack of Transportation (Non-Medical):   Physical Activity:   . Days of Exercise per Week:   . Minutes of Exercise per Session:   Stress:   . Feeling of Stress :   Social Connections:   . Frequency of Communication with Friends and Family:   . Frequency of Social Gatherings with Friends and Family:   . Attends Religious Services:   . Active Member of Clubs or Organizations:   . Attends Archivist Meetings:   Marland Kitchen Marital Status:   Intimate Partner Violence:   . Fear of Current or Ex-Partner:   . Emotionally Abused:   Marland Kitchen Physically Abused:   . Sexually Abused:     FAMILY HISTORY: No family history on file.  ALLERGIES:  is allergic to hydrocodone, iohexol, morphine and related, sulfa antibiotics, and sulfonamide derivatives.  MEDICATIONS:  Current Outpatient Medications  Medication Sig Dispense Refill  . acetaminophen (TYLENOL) 325 MG tablet Take 650 mg by mouth every 6 (six) hours as needed for moderate pain.     Marland Kitchen allopurinol (ZYLOPRIM) 300 MG tablet Take 300 mg by mouth daily.    Marland Kitchen diltiazem (TIAZAC) 360 MG 24 hr capsule Take 360 mg by mouth daily.    Marland Kitchen ELIQUIS 5 MG TABS tablet Take 5 mg by mouth 2 (two) times daily. Taking once daily    . insulin glargine, 1 Unit Dial, (TOUJEO SOLOSTAR) 300 UNIT/ML Solostar Pen Inject 46 Units into the skin daily.    . insulin lispro  (HUMALOG) 100 UNIT/ML KwikPen Inject 20 Units into the skin 3 (three) times daily.     Marland Kitchen levothyroxine (SYNTHROID) 100 MCG tablet Take 50-100 mcg by mouth See admin instructions. Take 100 mg daily except Sundays, on Sunday take 0.5 tablet to = 50 mg    . silodosin (RAPAFLO) 8 MG CAPS capsule Take 8 mg by mouth daily.     . simvastatin (ZOCOR) 20 MG tablet Take 20 mg by mouth at bedtime.    Marland Kitchen spironolactone (ALDACTONE) 50 MG tablet Take 50 mg by mouth daily.    Marland Kitchen torsemide (DEMADEX) 20 MG tablet Take 20  mg by mouth daily.     No current facility-administered medications for this visit.    REVIEW OF SYSTEMS:   Constitutional: ( - ) fevers, ( - )  chills , ( - ) night sweats Eyes: ( - ) blurriness of vision, ( - ) double vision, ( - ) watery eyes Ears, nose, mouth, throat, and face: ( - ) mucositis, ( - ) sore throat Respiratory: ( - ) cough, ( - ) dyspnea, ( - ) wheezes Cardiovascular: ( - ) palpitation, ( - ) chest discomfort, ( - ) lower extremity swelling Gastrointestinal:  ( - ) nausea, ( - ) heartburn, ( - ) change in bowel habits Skin: ( - ) abnormal skin rashes Lymphatics: ( - ) new lymphadenopathy, ( - ) easy bruising Neurological: ( - ) numbness, ( - ) tingling, ( - ) new weaknesses Behavioral/Psych: ( - ) mood change, ( - ) new changes  All other systems were reviewed with the patient and are negative.  PHYSICAL EXAMINATION:  Vitals:   03/20/20 1417  BP: (!) 122/35  Pulse: 63  Resp: 18  Temp: (!) 97.5 F (36.4 C)  SpO2: 100%   Filed Weights   03/20/20 1417  Weight: 254 lb 4.8 oz (115.3 kg)    GENERAL: well appearing elderly Caucasian male in NAD  SKIN: skin color, texture, turgor are normal, no rashes or significant lesions EYES: conjunctiva are pink and non-injected, sclera clear LUNGS: clear to auscultation and percussion with normal breathing effort HEART: regular rate & rhythm and no murmurs and no lower extremity edema Musculoskeletal: no cyanosis of digits  and no clubbing  PSYCH: alert & oriented x 3, fluent speech NEURO: no focal motor/sensory deficits  LABORATORY DATA:  I have reviewed the data as listed CBC Latest Ref Rng & Units 03/08/2020 02/19/2020 02/18/2020  WBC 4.0 - 10.5 K/uL 6.0 4.4 4.4  Hemoglobin 13.0 - 17.0 g/dL 7.0(L) 7.3(L) 7.6(L)  Hematocrit 39 - 52 % 23.0(L) 22.2(L) 22.9(L)  Platelets 150 - 400 K/uL 141(L) 102(L) 101(L)    CMP Latest Ref Rng & Units 03/08/2020 03/08/2020 02/19/2020  Glucose 70 - 99 mg/dL - 311(H) 109(H)  BUN 8 - 23 mg/dL - 55(H) 82(H)  Creatinine 0.61 - 1.24 mg/dL - 2.38(H) 2.45(H)  Sodium 135 - 145 mmol/L - 133(L) 138  Potassium 3.5 - 5.1 mmol/L 5.8(H) 6.4(HH) 4.3  Chloride 98 - 111 mmol/L - 102 108  CO2 22 - 32 mmol/L - 23 20(L)  Calcium 8.9 - 10.3 mg/dL - 8.2(L) 8.3(L)  Total Protein 6.5 - 8.1 g/dL - 7.1 -  Total Bilirubin 0.3 - 1.2 mg/dL - 1.7(H) -  Alkaline Phos 38 - 126 U/L - 142(H) -  AST 15 - 41 U/L - 32 -  ALT 0 - 44 U/L - 15 -   BLOOD FILM:  Review of the peripheral blood smear showed normal appearing white cells with neutrophils that were appropriately lobated and granulated. There was no predominance of bi-lobed or hyper-segmented neutrophils appreciated. No Dohle bodies were noted. There was no left shifting, immature forms or blasts noted. Lymphocytes remain normal in size without any predominance of large granular lymphocytes. Red cells show no anisopoikilocytosis, macrocytes , microcytes or polychromasia. There were no schistocytes, target cells, echinocytes, acanthocytes, dacrocytes, or stomatocytes.There was no rouleaux formation, nucleated red cells, or intra-cellular inclusions noted. The platelets are normal in size, shape, and color without any clumping evident.  RADIOGRAPHIC STUDIES: No results found.  ASSESSMENT & PLAN Eric Sousa  [name] 79 y.o. male with medical history significant for chronic diastolic heart failure, chronic kidney disease, diabetes, obesity, atrial fibrillation on  Eliquis who presents for evaluation of her hematological abnormalities.  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  The patient did have a bout of thrombocytopenia as well around the time of hospitalization, however he has rebounded to almost normal levels.  Given this I do believe that thrombocytopenia was transient in nature.  In terms of his anemia I do think he would benefit from erythropoietin supplementation, and therefore today we will order CBC and CMP in addition to iron levels and erythropoietin baseline.  In the event that the findings are most consistent with renal dysfunction as the cause with no other nutritional deficiencies or other etiologies made apparent we will proceed with Epogen.  For Epogen dosing, would recommend starting at 20,000 units every other week with target goal Hgb >10.0. This can be considered if endogenous erythropoietin level is <500. The patient was previously recommended Epogen therapy by his nephrologist, but this was reported declined by his insurance for the indication recommended.  In the event we were able to get this started and patient is showing response we could request transfer back to the patient's nephrologist for continued EPO dosing administration.  The patient notes that this would be his preference as it is much closer to home.  # Thrombocytopenia # Normocytic Anemia --today will order repeat CBC, CMP, LDH, and peripheral blood film --additionally will review nutritional labs with iron panel, ferritin, Vitamin b12, and folate --will order baseline erythropoietin level to determine if EPO shots could be a consideration to improve his Hgb --patients platelet count appears to be rebounding, Reason for his drop is not entirely clear, though we will continue to monitor --RTC in 6 weeks, with interval scheduled  erythropoietin shots.   Orders Placed This Encounter  Procedures  . CBC with Differential (Cancer Center Only)    Standing Status:   Future    Number of Occurrences:   1    Standing Expiration Date:   03/20/2021  . Retic Panel    Standing Status:   Future    Number of Occurrences:   1    Standing Expiration Date:   03/20/2021  . CMP ([facility] only)    Standing Status:   Future    Number of Occurrences:   1    Standing Expiration Date:   03/20/2021  . Lactate dehydrogenase (LDH)    Standing Status:   Future    Number of Occurrences:   1    Standing Expiration Date:   03/20/2021  . Iron and TIBC    Standing Status:   Future    Number of Occurrences:   1    Standing Expiration Date:   03/20/2021  . Ferritin    Standing Status:   Future    Number of Occurrences:   1    Standing Expiration Date:   03/20/2021  . Save Smear ([facility])    Standing Status:   Future    Number of Occurrences:   1    Standing Expiration Date:   03/20/2021  . Erythropoietin    Standing Status:   Future    Number of Occurrences:   1    Standing Expiration Date:   03/20/2021  . Vitamin B12    Standing Status:  Future    Number of Occurrences:   1    Standing Expiration Date:   03/20/2021  . Folate, Serum    Standing Status:   Future    Number of Occurrences:   1    Standing Expiration Date:   03/20/2021  . Sample to Blood Bank    Standing Status:   Future    Number of Occurrences:   1    Standing Expiration Date:   03/20/2021    All questions were answered. The patient knows to call the clinic with any problems, questions or concerns.  A total of more than 60 minutes were spent on this encounter and over half of that time was spent on counseling and coordination of care as outlined above.   Ledell Peoples, MD Department of Hematology/Oncology [place] [facility] Phone: [phone] Pager: [phone] Email: [email or web address]  03/20/2020 3:36 PM

## 2020-03-19 NOTE — Telephone Encounter (Signed)
Returned TC to patient in regard to some questions that he had about his first visit to the cancer center tomorrow. I let him know that he is allowed to have 1 visitor with him while seeing the doctor. Also let him know that he comes in through the cancer center enterance right beside the main hospital entrance. Lastly, let him know that we do have valet services that can park his car for him since he was concerned that he's unable to walk very far. Patient verbalized understanding and was appreciative of the call back.

## 2020-03-20 ENCOUNTER — Telehealth: Payer: Self-pay | Admitting: *Deleted

## 2020-03-20 ENCOUNTER — Other Ambulatory Visit: Payer: Self-pay

## 2020-03-20 ENCOUNTER — Emergency Department (HOSPITAL_COMMUNITY)
Admission: EM | Admit: 2020-03-20 | Discharge: 2020-03-21 | Disposition: A | Discharge status: Home / Self Care | Payer: Medicare Other | Attending: Emergency Medicine | Admitting: Emergency Medicine

## 2020-03-20 ENCOUNTER — Encounter (HOSPITAL_COMMUNITY): Payer: Self-pay | Admitting: Emergency Medicine

## 2020-03-20 ENCOUNTER — Encounter: Payer: Self-pay | Admitting: *Deleted

## 2020-03-20 ENCOUNTER — Encounter: Payer: Self-pay | Admitting: Hematology and Oncology

## 2020-03-20 ENCOUNTER — Inpatient Hospital Stay (HOSPITAL_BASED_OUTPATIENT_CLINIC_OR_DEPARTMENT_OTHER): Payer: Medicare Other | Admitting: Hematology and Oncology

## 2020-03-20 ENCOUNTER — Inpatient Hospital Stay: Payer: Medicare Other

## 2020-03-20 VITALS — BP 122/35 | HR 63 | Temp 97.5°F | Resp 18 | Ht 72.0 in | Wt 254.3 lb

## 2020-03-20 DIAGNOSIS — D649 Anemia, unspecified: Secondary | ICD-10-CM | POA: Insufficient documentation

## 2020-03-20 DIAGNOSIS — E119 Type 2 diabetes mellitus without complications: Secondary | ICD-10-CM | POA: Diagnosis not present

## 2020-03-20 DIAGNOSIS — Z7901 Long term (current) use of anticoagulants: Secondary | ICD-10-CM | POA: Insufficient documentation

## 2020-03-20 DIAGNOSIS — K5900 Constipation, unspecified: Secondary | ICD-10-CM | POA: Insufficient documentation

## 2020-03-20 DIAGNOSIS — E669 Obesity, unspecified: Secondary | ICD-10-CM | POA: Insufficient documentation

## 2020-03-20 DIAGNOSIS — I4891 Unspecified atrial fibrillation: Secondary | ICD-10-CM | POA: Insufficient documentation

## 2020-03-20 DIAGNOSIS — I5032 Chronic diastolic (congestive) heart failure: Secondary | ICD-10-CM | POA: Insufficient documentation

## 2020-03-20 DIAGNOSIS — Z87891 Personal history of nicotine dependence: Secondary | ICD-10-CM | POA: Insufficient documentation

## 2020-03-20 DIAGNOSIS — I110 Hypertensive heart disease with heart failure: Secondary | ICD-10-CM | POA: Insufficient documentation

## 2020-03-20 DIAGNOSIS — Z79899 Other long term (current) drug therapy: Secondary | ICD-10-CM | POA: Insufficient documentation

## 2020-03-20 DIAGNOSIS — Z794 Long term (current) use of insulin: Secondary | ICD-10-CM | POA: Insufficient documentation

## 2020-03-20 DIAGNOSIS — N189 Chronic kidney disease, unspecified: Secondary | ICD-10-CM | POA: Insufficient documentation

## 2020-03-20 DIAGNOSIS — E1122 Type 2 diabetes mellitus with diabetic chronic kidney disease: Secondary | ICD-10-CM | POA: Insufficient documentation

## 2020-03-20 DIAGNOSIS — I130 Hypertensive heart and chronic kidney disease with heart failure and stage 1 through stage 4 chronic kidney disease, or unspecified chronic kidney disease: Secondary | ICD-10-CM | POA: Insufficient documentation

## 2020-03-20 LAB — RETIC PANEL
Immature Retic Fract: 25.3 % — ABNORMAL HIGH (ref 2.3–15.9)
RBC.: 2.1 MIL/uL — ABNORMAL LOW (ref 4.22–5.81)
Retic Count, Absolute: 169.1 10*3/uL (ref 19.0–186.0)
Retic Ct Pct: 8.1 % — ABNORMAL HIGH (ref 0.4–3.1)
Reticulocyte Hemoglobin: 28.7 pg (ref >27.9)

## 2020-03-20 LAB — CBC WITH DIFFERENTIAL (CANCER CENTER ONLY)
Abs Immature Granulocytes: 0.05 10*3/uL (ref 0.00–0.07)
Basophils Absolute: 0.1 10*3/uL (ref 0.0–0.1)
Basophils Relative: 1 %
Eosinophils Absolute: 0.1 10*3/uL (ref 0.0–0.5)
Eosinophils Relative: 2 %
HCT: 20.1 % — ABNORMAL LOW (ref 39.0–52.0)
Hemoglobin: 6.2 g/dL — CL (ref 13.0–17.0)
Immature Granulocytes: 1 %
Lymphocytes Relative: 12 %
Lymphs Abs: 0.9 10*3/uL (ref 0.7–4.0)
MCH: 29.4 pg (ref 26.0–34.0)
MCHC: 30.8 g/dL (ref 30.0–36.0)
MCV: 95.3 fL (ref 80.0–100.0)
Monocytes Absolute: 0.7 10*3/uL (ref 0.1–1.0)
Monocytes Relative: 9 %
Neutro Abs: 5.5 10*3/uL (ref 1.7–7.7)
Neutrophils Relative %: 75 %
Platelet Count: 137 10*3/uL — ABNORMAL LOW (ref 150–400)
RBC: 2.11 MIL/uL — ABNORMAL LOW (ref 4.22–5.81)
RDW: 19 % — ABNORMAL HIGH (ref 11.5–15.5)
WBC Count: 7.3 10*3/uL (ref 4.0–10.5)
nRBC: 0 % (ref 0.0–0.2)

## 2020-03-20 LAB — COMPREHENSIVE METABOLIC PANEL
ALT: 12 U/L (ref 0–44)
AST: 12 U/L — ABNORMAL LOW (ref 15–41)
Albumin: 3.7 g/dL (ref 3.5–5.0)
Alkaline Phosphatase: 120 U/L (ref 38–126)
Anion gap: 12 (ref 5–15)
BUN: 79 mg/dL — ABNORMAL HIGH (ref 8–23)
CO2: 19 mmol/L — ABNORMAL LOW (ref 22–32)
Calcium: 8.4 mg/dL — ABNORMAL LOW (ref 8.9–10.3)
Chloride: 103 mmol/L (ref 98–111)
Creatinine, Ser: 2.86 mg/dL — ABNORMAL HIGH (ref 0.61–1.24)
GFR calc Af Amer: 23 mL/min — ABNORMAL LOW (ref >60)
GFR calc non Af Amer: 20 mL/min — ABNORMAL LOW (ref >60)
Glucose, Bld: 278 mg/dL — ABNORMAL HIGH (ref 70–99)
Potassium: 6.2 mmol/L — ABNORMAL HIGH (ref 3.5–5.1)
Sodium: 134 mmol/L — ABNORMAL LOW (ref 135–145)
Total Bilirubin: 2.5 mg/dL — ABNORMAL HIGH (ref 0.3–1.2)
Total Protein: 6.8 g/dL (ref 6.5–8.1)

## 2020-03-20 LAB — CMP (CANCER CENTER ONLY)
ALT: 9 U/L (ref 0–44)
AST: 11 U/L — ABNORMAL LOW (ref 15–41)
Albumin: 3.5 g/dL (ref 3.5–5.0)
Alkaline Phosphatase: 141 U/L — ABNORMAL HIGH (ref 38–126)
Anion gap: 9 (ref 5–15)
BUN: 72 mg/dL — ABNORMAL HIGH (ref 8–23)
CO2: 20 mmol/L — ABNORMAL LOW (ref 22–32)
Calcium: 8.6 mg/dL — ABNORMAL LOW (ref 8.9–10.3)
Chloride: 104 mmol/L (ref 98–111)
Creatinine: 2.86 mg/dL — ABNORMAL HIGH (ref 0.61–1.24)
GFR, Est AFR Am: 23 mL/min — ABNORMAL LOW (ref >60)
GFR, Estimated: 20 mL/min — ABNORMAL LOW (ref >60)
Glucose, Bld: 277 mg/dL — ABNORMAL HIGH (ref 70–99)
Potassium: 5.5 mmol/L — ABNORMAL HIGH (ref 3.5–5.1)
Sodium: 133 mmol/L — ABNORMAL LOW (ref 135–145)
Total Bilirubin: 2.1 mg/dL — ABNORMAL HIGH (ref 0.3–1.2)
Total Protein: 6.7 g/dL (ref 6.5–8.1)

## 2020-03-20 LAB — CBC
HCT: 20 % — ABNORMAL LOW (ref 39.0–52.0)
Hemoglobin: 6.1 g/dL — CL (ref 13.0–17.0)
MCH: 29.2 pg (ref 26.0–34.0)
MCHC: 30.5 g/dL (ref 30.0–36.0)
MCV: 95.7 fL (ref 80.0–100.0)
Platelets: 123 10*3/uL — ABNORMAL LOW (ref 150–400)
RBC: 2.09 MIL/uL — ABNORMAL LOW (ref 4.22–5.81)
RDW: 19 % — ABNORMAL HIGH (ref 11.5–15.5)
WBC: 7.2 10*3/uL (ref 4.0–10.5)
nRBC: 0 % (ref 0.0–0.2)

## 2020-03-20 LAB — CBG MONITORING, ED: Glucose-Capillary: 250 mg/dL — ABNORMAL HIGH (ref 70–99)

## 2020-03-20 LAB — VITAMIN B12: Vitamin B-12: 194 pg/mL (ref 180–914)

## 2020-03-20 LAB — LACTATE DEHYDROGENASE: LDH: 216 U/L — ABNORMAL HIGH (ref 98–192)

## 2020-03-20 LAB — SAMPLE TO BLOOD BANK

## 2020-03-20 LAB — SAVE SMEAR(SSMR), FOR PROVIDER SLIDE REVIEW

## 2020-03-20 LAB — FOLATE: Folate: 8.4 ng/mL (ref >5.9)

## 2020-03-20 MED ORDER — SODIUM CHLORIDE 0.9 % IV SOLN
10.0000 mL/h | Freq: Once | INTRAVENOUS | Status: AC
  Administered 2020-03-21: 10 mL/h via INTRAVENOUS

## 2020-03-20 MED ORDER — INSULIN ASPART 100 UNIT/ML ~~LOC~~ SOLN
19.0000 [IU] | Freq: Once | SUBCUTANEOUS | Status: AC
  Administered 2020-03-21: 19 [IU] via SUBCUTANEOUS
  Filled 2020-03-20: qty 0.19

## 2020-03-20 MED ORDER — SODIUM ZIRCONIUM CYCLOSILICATE 10 G PO PACK
10.0000 g | PACK | Freq: Once | ORAL | Status: AC
  Administered 2020-03-21: 10 g via ORAL
  Filled 2020-03-20: qty 1

## 2020-03-20 MED ORDER — SODIUM BICARBONATE 8.4 % IV SOLN
50.0000 meq | Freq: Once | INTRAVENOUS | Status: AC
  Administered 2020-03-21: 50 meq via INTRAVENOUS
  Filled 2020-03-20: qty 50

## 2020-03-20 NOTE — ED Triage Notes (Signed)
Sent by cancer center D/T HMG of 6.2. Endorses tiredness, patient is pale.

## 2020-03-20 NOTE — Progress Notes (Signed)
CRITICAL VALUE STICKER  CRITICAL VALUE: Hgb 6.2  RECEIVER (on-site recipient of call): Kenyah Luba,RN   DATE & TIME NOTIFIED: 03/20/20 @ 1553  MESSENGER (representative from lab): Suanne Marker  MD NOTIFIED: Dr. Lorenso Courier  TIME OF NOTIFICATION:1555  RESPONSE:

## 2020-03-20 NOTE — Telephone Encounter (Signed)
TCT patient regarding lab results of HGB of 6.2 as pt has already left the clinic. Message left for him to call back asap.   Received call back and advised that he needs to go to [facility] ED for blood transfusion as the clinic closes in 30 minutes. Pt voiced understanding.  Dr. Lorenso Courier notified ED of pt's pending arrival.

## 2020-03-21 ENCOUNTER — Other Ambulatory Visit: Payer: Self-pay | Admitting: *Deleted

## 2020-03-21 ENCOUNTER — Emergency Department (HOSPITAL_COMMUNITY): Payer: Medicare Other

## 2020-03-21 DIAGNOSIS — D649 Anemia, unspecified: Secondary | ICD-10-CM

## 2020-03-21 LAB — CBC
HCT: 22.2 % — ABNORMAL LOW (ref 39.0–52.0)
Hemoglobin: 7.1 g/dL — ABNORMAL LOW (ref 13.0–17.0)
MCH: 29.7 pg (ref 26.0–34.0)
MCHC: 32 g/dL (ref 30.0–36.0)
MCV: 92.9 fL (ref 80.0–100.0)
Platelets: 131 10*3/uL — ABNORMAL LOW (ref 150–400)
RBC: 2.39 MIL/uL — ABNORMAL LOW (ref 4.22–5.81)
RDW: 17.7 % — ABNORMAL HIGH (ref 11.5–15.5)
WBC: 5.6 10*3/uL (ref 4.0–10.5)
nRBC: 0 % (ref 0.0–0.2)

## 2020-03-21 LAB — IRON AND TIBC
Iron: 47 ug/dL (ref 42–163)
Saturation Ratios: 13 % — ABNORMAL LOW (ref 20–55)
TIBC: 353 ug/dL (ref 202–409)
UIBC: 305 ug/dL (ref 117–376)

## 2020-03-21 LAB — BASIC METABOLIC PANEL
Anion gap: 8 (ref 5–15)
BUN: 71 mg/dL — ABNORMAL HIGH (ref 8–23)
CO2: 21 mmol/L — ABNORMAL LOW (ref 22–32)
Calcium: 8 mg/dL — ABNORMAL LOW (ref 8.9–10.3)
Chloride: 105 mmol/L (ref 98–111)
Creatinine, Ser: 2.43 mg/dL — ABNORMAL HIGH (ref 0.61–1.24)
GFR calc Af Amer: 28 mL/min — ABNORMAL LOW (ref >60)
GFR calc non Af Amer: 25 mL/min — ABNORMAL LOW (ref >60)
Glucose, Bld: 218 mg/dL — ABNORMAL HIGH (ref 70–99)
Potassium: 4.8 mmol/L (ref 3.5–5.1)
Sodium: 134 mmol/L — ABNORMAL LOW (ref 135–145)

## 2020-03-21 LAB — PREPARE RBC (CROSSMATCH)

## 2020-03-21 LAB — ERYTHROPOIETIN: Erythropoietin: 215.9 m[IU]/mL — ABNORMAL HIGH (ref 2.6–18.5)

## 2020-03-21 LAB — FERRITIN: Ferritin: 64 ng/mL (ref 24–336)

## 2020-03-21 MED ORDER — FUROSEMIDE 10 MG/ML IJ SOLN
40.0000 mg | Freq: Once | INTRAMUSCULAR | Status: AC
  Administered 2020-03-21: 40 mg via INTRAVENOUS
  Filled 2020-03-21: qty 4

## 2020-03-21 NOTE — Discharge Instructions (Addendum)
1.  Follow-up with your doctor as soon as possible. 2.  Return to the emergency department if you have shortness of breath, lightheadedness, atypical pains or other concerning symptoms.

## 2020-03-21 NOTE — ED Provider Notes (Signed)
Eric [name]   CSN: [id number] Arrival date & time: 03/20/20  1649     History Chief Complaint  Patient presents with  . low hemoglobin    Eric [name] is a 79 y.o. male.  HPI     Past Medical History:  Diagnosis Date  . Gall bladder disease 2020  . History of right hip replacement    Pt stated 8-9 yrs ago    Patient Active Problem List   Diagnosis Date Noted  . Normocytic anemia 03/20/2020  . Thrombocytopenia ([place]) 02/20/2020  . Neurocognitive deficits 02/20/2020  . Venous insufficiency 02/19/2020  . Cellulitis of lower extremity   . Acute on chronic diastolic heart failure ([place]) 02/12/2020  . DIASTOLIC HEART FAILURE, CHRONIC 02/07/2009  . 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 OBESITY 02/06/2009  . Essential hypertension, benign 02/06/2009  . Atrial fibrillation ([place]) 01/15/2009    Past Surgical History:  Procedure Laterality Date  . APPENDECTOMY    . CHOLECYSTECTOMY    . TOTAL HIP ARTHROPLASTY         No family history on file.  Social History   Tobacco Use  . Smoking status: Never Smoker  . Smokeless tobacco: Never Used  Substance Use Topics  . Alcohol use: Never  . Drug use: Never    Home Medications Prior to Admission medications   Medication Sig Start Date End Date Taking? Authorizing Provider  acetaminophen (TYLENOL) 325 MG tablet Take 650 mg by mouth every 6 (six) hours as needed for moderate pain.    Yes [provider]  allopurinol (ZYLOPRIM) 300 MG tablet Take 300 mg by mouth daily.   Yes [provider]  augmented betamethasone dipropionate (DIPROLENE-AF) 0.05 % cream Apply topically 2 (two) times daily as needed (rash).  01/08/20  Yes [provider]  diltiazem (TIAZAC) 360 MG 24 hr capsule Take 360 mg by mouth daily. 11/27/19  Yes [provider]  ELIQUIS 5 MG TABS tablet Take 5 mg by mouth 2 (two) times daily. Taking once  daily 01/14/20  Yes [provider]  insulin glargine, 1 Unit Dial, (TOUJEO SOLOSTAR) 300 UNIT/ML Solostar Pen Inject 55 Units into the skin daily.  08/31/15  Yes [provider]  insulin lispro (HUMALOG) 100 UNIT/ML KwikPen Inject 19 Units into the skin 3 (three) times daily.  04/15/15  Yes [provider]  levothyroxine (SYNTHROID) 100 MCG tablet Take 50-100 mcg by mouth See admin instructions. Takes 100 mg daily except Sundays, on Sunday takes 50 mg 01/14/20  Yes [provider]  silodosin (RAPAFLO) 8 MG CAPS capsule Take 8 mg by mouth daily.  12/13/19  Yes [provider]  simvastatin (ZOCOR) 20 MG tablet Take 20 mg by mouth at bedtime. 11/27/19  Yes [provider]  spironolactone (ALDACTONE) 50 MG tablet Take 50 mg by mouth daily. 11/27/19  Yes [provider]  torsemide (DEMADEX) 20 MG tablet Take 20 mg by mouth daily. 08/31/19  Yes [provider]  traZODone (DESYREL) 50 MG tablet  03/14/20   [provider]    Allergies    Hydrocodone, Iohexol, Morphine and related, Sulfa antibiotics, Sulfonamide derivatives, and Trazodone and nefazodone  Review of Systems   Review of Systems  Constitutional: Positive for activity change.  Respiratory: Positive for shortness of breath.   Cardiovascular: Negative for chest pain.  Gastrointestinal: Negative for nausea and vomiting.  Neurological: Positive for dizziness.  Hematological: Bruises/bleeds easily.  All other  systems reviewed and are negative.   Physical Exam Updated Vital Signs BP (!) 152/46   Pulse 66   Temp 97.9 F (36.6 C) (Oral)   Resp 19   SpO2 100%   Physical Exam Vitals and nursing [name] reviewed.  Constitutional:      Appearance: He is well-developed.  HENT:     Head: Atraumatic.  Cardiovascular:     Rate and Rhythm: Eric rate.  Pulmonary:     Effort: Pulmonary effort is Eric.  Musculoskeletal:     Cervical back: Neck supple.  Skin:     General: Skin is warm.  Neurological:     Mental Status: He is alert and oriented to person, place, and time.     ED Results / Procedures / Treatments   Labs (all labs ordered are listed, but only abnormal results are displayed) Labs Reviewed  COMPREHENSIVE METABOLIC PANEL - Abnormal; Notable for the following components:      Result Value   Sodium 134 (*)    Potassium 6.2 (*)    CO2 19 (*)    Glucose, Bld 278 (*)    BUN 79 (*)    Creatinine, Ser 2.86 (*)    Calcium 8.4 (*)    AST 12 (*)    Total Bilirubin 2.5 (*)    GFR calc non Af Amer 20 (*)    GFR calc Af Amer 23 (*)    All other components within Eric limits  CBC - Abnormal; Notable for the following components:   RBC 2.09 (*)    Hemoglobin 6.1 (*)    HCT 20.0 (*)    RDW 19.0 (*)    Platelets 123 (*)    All other components within Eric limits  CBG MONITORING, ED - Abnormal; Notable for the following components:   Glucose-Capillary 250 (*)    All other components within Eric limits  BASIC METABOLIC PANEL  TYPE AND SCREEN  PREPARE RBC (CROSSMATCH)    EKG EKG Interpretation  Date/Time:  Thursday March 21 2020 00:27:58 EDT Ventricular Rate:  62 PR Interval:    QRS Duration: 104 QT Interval:  427 QTC Calculation: 434 R Axis:   91 Text Interpretation: Atrial fibrillation Right axis deviation Low voltage, precordial leads Probable anteroseptal infarct, old No acute changes No significant change since last tracing Confirmed by Varney Biles [phone]) on 03/21/2020 1:31:15 AM   Radiology DG Chest 1 View  Result Date: 03/21/2020 CLINICAL DATA:  Extremity edema EXAM: CHEST  1 VIEW COMPARISON:  02/12/2020 FINDINGS: Unchanged intermediate sized right pleural effusion with basilar atelectasis. Moderate cardiomegaly. Left lung is clear. IMPRESSION: Unchanged intermediate sized right pleural effusion and moderate cardiomegaly. Electronically Signed   By: Ulyses Jarred M.D.   On: 03/21/2020 00:43     Procedures .Critical Care Performed by: Varney Biles, MD Authorized by: Varney Biles, MD   Critical care provider statement:    Critical care time (minutes):  45   Critical care was necessary to treat or prevent imminent or life-threatening deterioration of the following conditions:  Circulatory failure   Critical care was time spent personally by me on the following activities:  Discussions with consultants, evaluation of patient's response to treatment, examination of patient, ordering and performing treatments and interventions, ordering and review of laboratory studies, ordering and review of radiographic studies, pulse oximetry, re-evaluation of patient's condition, obtaining history from patient or surrogate and review of old charts   (including critical care time)  Medications Ordered in ED Medications  furosemide (  LASIX) injection 40 mg (has no administration in time range)  0.9 %  sodium chloride infusion (10 mL/hr Intravenous New Bag/Given (Non-Interop) 03/21/20 0200)  insulin aspart (novoLOG) injection 19 Units (19 Units Subcutaneous Given 03/21/20 0022)  sodium zirconium cyclosilicate (LOKELMA) packet 10 g (10 g Oral Given 03/21/20 0016)  sodium bicarbonate injection 50 mEq (50 mEq Intravenous Given 03/21/20 0016)    ED Course  I have reviewed the triage vital signs and the nursing notes.  Pertinent labs & imaging results that were available during my care of the patient were reviewed by me and considered in my medical decision making (see chart for details).    MDM Rules/Calculators/A&P                          79 year old comes in with chief complaint of low hemoglobin.  Patient reports that he has been having weakness, shortness of breath with exertion over the last few days.  He was seen by hematology today and noted to have severe anemia.  Patient was advised to come to the ER for transfusion.  Patient is hemoglobin is 6.2.  We will give him 2 units of PRBCs.  I  discussed the case with Dr. Payton Mccallum, oncology.  He thinks that the patient can be followed up as an outpatient.  I have sent a message to both him and Dr. Ronnald Ramp, oncology to ensure that patient gets a close follow-up.  Patient is noted to have a slightly high potassium.  EKG is not showing any concerning findings.  We will give him sodium bicarb and leukemia.  He will get Lasix in between his blood transfusion which should further reduce the potassium.  Patient's care will be signed out to Dr. Dina Rich.  Patient will get repeat basic metabolic profile after his transfusion is completed.  He will be stable for discharge if he does not have any respiratory/pulmonary symptoms or worsening K.  Patient has been made aware of this plan.  Final Clinical Impression(s) / ED Diagnoses Final diagnoses:  None    Rx / DC Orders ED Discharge Orders    None       Varney Biles, MD 03/21/20 0201

## 2020-03-21 NOTE — ED Notes (Signed)
Pt. Documented in error see above note in chart. 

## 2020-03-21 NOTE — ED Provider Notes (Signed)
Patient signed out with plan to complete 2 units of blood transfusion and repeat metabolic panel for potassium and CBC.  If stable, plan for discharge. Physical Exam  BP (!) 147/60 (BP Location: Left Arm)   Pulse 66   Temp 97.9 F (36.6 C) (Oral)   Resp 14   Ht 6' (1.829 m)   Wt 114.3 kg   SpO2 100%   BMI 34.18 kg/m   Physical Exam  ED Course/Procedures     Procedures  MDM  Potassium normal at 4.8.  Hemoglobin at baseline 7.1.  Patient feels well.  Plan for discharge.       Charlesetta Shanks, MD 03/21/20 [id number]

## 2020-03-22 ENCOUNTER — Telehealth: Payer: Self-pay | Admitting: Hematology and Oncology

## 2020-03-22 LAB — TYPE AND SCREEN
ABO/RH(D): O POS
Antibody Screen: NEGATIVE
Unit division: 0
Unit division: 0

## 2020-03-22 LAB — BPAM RBC
Blood Product Expiration Date: 202107262359
Blood Product Expiration Date: 202107262359
ISSUE DATE / TIME: 202107010144
ISSUE DATE / TIME: 202107010431
Unit Type and Rh: 5100
Unit Type and Rh: 5100

## 2020-03-22 NOTE — Telephone Encounter (Signed)
Called and spoke with patient to schedule appts. Patient wants to hold off on scheduling until he know insurance will cover it. Message sent to [facility]

## 2020-03-22 NOTE — Telephone Encounter (Signed)
Scheduled appt per 7/1 sch msg - pt is aware of papt scheduled on 7/9

## 2020-03-26 ENCOUNTER — Telehealth: Payer: Self-pay | Admitting: Hematology and Oncology

## 2020-03-26 NOTE — Telephone Encounter (Signed)
Called and spoke with patient about scheduling appts that he was unsure about due to insurance. Scheduled all appts and confirmed with patient

## 2020-03-27 ENCOUNTER — Other Ambulatory Visit: Payer: Self-pay | Admitting: Hematology and Oncology

## 2020-03-29 ENCOUNTER — Other Ambulatory Visit: Payer: Self-pay

## 2020-03-29 ENCOUNTER — Other Ambulatory Visit: Payer: Self-pay | Admitting: *Deleted

## 2020-03-29 ENCOUNTER — Inpatient Hospital Stay: Payer: Medicare Other | Attending: Hematology and Oncology

## 2020-03-29 ENCOUNTER — Inpatient Hospital Stay: Payer: Medicare Other

## 2020-03-29 VITALS — BP 130/46 | HR 50 | Temp 98.6°F | Resp 18

## 2020-03-29 DIAGNOSIS — D649 Anemia, unspecified: Secondary | ICD-10-CM

## 2020-03-29 DIAGNOSIS — Z79899 Other long term (current) drug therapy: Secondary | ICD-10-CM | POA: Insufficient documentation

## 2020-03-29 LAB — CBC WITH DIFFERENTIAL (CANCER CENTER ONLY)
Abs Immature Granulocytes: 0.02 10*3/uL (ref 0.00–0.07)
Basophils Absolute: 0 10*3/uL (ref 0.0–0.1)
Basophils Relative: 1 %
Eosinophils Absolute: 0.2 10*3/uL (ref 0.0–0.5)
Eosinophils Relative: 2 %
HCT: 22.2 % — ABNORMAL LOW (ref 39.0–52.0)
Hemoglobin: 6.9 g/dL — CL (ref 13.0–17.0)
Immature Granulocytes: 0 %
Lymphocytes Relative: 14 %
Lymphs Abs: 1 10*3/uL (ref 0.7–4.0)
MCH: 28.8 pg (ref 26.0–34.0)
MCHC: 31.1 g/dL (ref 30.0–36.0)
MCV: 92.5 fL (ref 80.0–100.0)
Monocytes Absolute: 0.7 10*3/uL (ref 0.1–1.0)
Monocytes Relative: 9 %
Neutro Abs: 5.1 10*3/uL (ref 1.7–7.7)
Neutrophils Relative %: 74 %
Platelet Count: 101 10*3/uL — ABNORMAL LOW (ref 150–400)
RBC: 2.4 MIL/uL — ABNORMAL LOW (ref 4.22–5.81)
RDW: 17.6 % — ABNORMAL HIGH (ref 11.5–15.5)
WBC Count: 7 10*3/uL (ref 4.0–10.5)
nRBC: 0 % (ref 0.0–0.2)

## 2020-03-29 LAB — SAMPLE TO BLOOD BANK

## 2020-03-29 LAB — PREPARE RBC (CROSSMATCH)

## 2020-03-29 MED ORDER — ACETAMINOPHEN 325 MG PO TABS
ORAL_TABLET | ORAL | Status: AC
  Filled 2020-03-29: qty 2

## 2020-03-29 MED ORDER — SODIUM CHLORIDE 0.9% IV SOLUTION
250.0000 mL | Freq: Once | INTRAVENOUS | Status: AC
  Administered 2020-03-29: 250 mL via INTRAVENOUS
  Filled 2020-03-29: qty 250

## 2020-03-29 MED ORDER — ACETAMINOPHEN 325 MG PO TABS
650.0000 mg | ORAL_TABLET | Freq: Once | ORAL | Status: AC
  Administered 2020-03-29: 650 mg via ORAL

## 2020-03-29 MED ORDER — EPOETIN ALFA-EPBX 40000 UNIT/ML IJ SOLN: 20000.0000 [IU] | Freq: Once | INTRAMUSCULAR | Status: DC

## 2020-03-29 MED ORDER — EPOETIN ALFA-EPBX 10000 UNIT/ML IJ SOLN
20000.0000 [IU] | Freq: Once | INTRAMUSCULAR | Status: AC
  Administered 2020-03-29: 20000 [IU] via SUBCUTANEOUS

## 2020-03-29 MED ORDER — EPOETIN ALFA-EPBX 10000 UNIT/ML IJ SOLN
INTRAMUSCULAR | Status: AC
  Filled 2020-03-29: qty 2

## 2020-03-29 NOTE — Patient Instructions (Signed)
Blood Transfusion, Adult, Care After This sheet gives you information about how to care for yourself after your procedure. Your doctor may also give you more specific instructions. If you have problems or questions, contact your doctor. What can I expect after the procedure? After the procedure, it is common to have:  Bruising and soreness at the IV site.  A fever or chills on the day of the procedure. This may be your body's response to the new blood cells received.  A headache. Follow these instructions at home: Insertion site care      Follow instructions from your doctor about how to take care of your insertion site. This is where an IV tube was put into your vein. Make sure you: ? Wash your hands with soap and water before and after you change your bandage (dressing). If you cannot use soap and water, use hand sanitizer. ? Change your bandage as told by your doctor.  Check your insertion site every day for signs of infection. Check for: ? Redness, swelling, or pain. ? Bleeding from the site. ? Warmth. ? Pus or a bad smell. General instructions  Take over-the-counter and prescription medicines only as told by your doctor.  Rest as told by your doctor.  Go back to your normal activities as told by your doctor.  Keep all follow-up visits as told by your doctor. This is important. Contact a doctor if:  You have itching or red, swollen areas of skin (hives).  You feel worried or nervous (anxious).  You feel weak after doing your normal activities.  You have redness, swelling, warmth, or pain around the insertion site.  You have blood coming from the insertion site, and the blood does not stop with pressure.  You have pus or a bad smell coming from the insertion site. Get help right away if:  You have signs of a serious reaction. This may be coming from an allergy or the body's defense system (immune system). Signs include: ? Trouble breathing or shortness of  breath. ? Swelling of the face or feeling warm (flushed). ? Fever or chills. ? Head, chest, or back pain. ? Dark pee (urine) or blood in the pee. ? Widespread rash. ? Fast heartbeat. ? Feeling dizzy or light-headed. You may receive your blood transfusion in an outpatient setting. If so, you will be told whom to contact to report any reactions. These symptoms may be an emergency. Do not wait to see if the symptoms will go away. Get medical help right away. Call your local emergency services (911 in the [place] Do not drive yourself to the hospital. Summary  Bruising and soreness at the IV site are common.  Check your insertion site every day for signs of infection.  Rest as told by your doctor. Go back to your normal activities as told by your doctor.  Get help right away if you have signs of a serious reaction. This information is not intended to replace advice given to you by your health care provider. Make sure you discuss any questions you have with your health care provider. Document Revised: 03/02/2019 Document Reviewed: 03/02/2019 Elsevier Patient Education  2020 Elsevier Inc.  Epoetin Alfa injection What is this medicine? EPOETIN ALFA (e POE e tin AL fa) helps your body make more red blood cells. This medicine is used to treat anemia caused by chronic kidney disease, cancer chemotherapy, or HIV-therapy. It may also be used before surgery if you have anemia. This medicine may be used   for other purposes; ask your health care provider or pharmacist if you have questions. COMMON BRAND NAME(S): Epogen, Procrit, Retacrit What should I tell my health care provider before I take this medicine? They need to know if you have any of these conditions:  cancer  heart disease  high blood pressure  history of blood clots  history of stroke  low levels of folate, iron, or vitamin B12 in the blood  seizures  an unusual or allergic reaction to erythropoietin, albumin, benzyl  alcohol, hamster proteins, other medicines, foods, dyes, or preservatives  pregnant or trying to get pregnant  breast-feeding How should I use this medicine? This medicine is for injection into a vein or under the skin. It is usually given by a health care professional in a hospital or clinic setting. If you get this medicine at home, you will be taught how to prepare and give this medicine. Use exactly as directed. Take your medicine at regular intervals. Do not take your medicine more often than directed. It is important that you put your used needles and syringes in a special sharps container. Do not put them in a trash can. If you do not have a sharps container, call your pharmacist or healthcare provider to get one. A special MedGuide will be given to you by the pharmacist with each prescription and refill. Be sure to read this information carefully each time. Talk to your pediatrician regarding the use of this medicine in children. While this drug may be prescribed for selected conditions, precautions do apply. Overdosage: If you think you have taken too much of this medicine contact a poison control center or emergency room at once. NOTE: This medicine is only for you. Do not share this medicine with others. What if I miss a dose? If you miss a dose, take it as soon as you can. If it is almost time for your next dose, take only that dose. Do not take double or extra doses. What may interact with this medicine? Interactions have not been studied. This list may not describe all possible interactions. Give your health care provider a list of all the medicines, herbs, non-prescription drugs, or dietary supplements you use. Also tell them if you smoke, drink alcohol, or use illegal drugs. Some items may interact with your medicine. What should I watch for while using this medicine? Your condition will be monitored carefully while you are receiving this medicine. You may need blood work done  while you are taking this medicine. This medicine may cause a decrease in vitamin B6. You should make sure that you get enough vitamin B6 while you are taking this medicine. Discuss the foods you eat and the vitamins you take with your health care professional. What side effects may I notice from receiving this medicine? Side effects that you should report to your doctor or health care professional as soon as possible:  allergic reactions like skin rash, itching or hives, swelling of the face, lips, or tongue  seizures  signs and symptoms of a blood clot such as breathing problems; changes in vision; chest pain; severe, sudden headache; pain, swelling, warmth in the leg; trouble speaking; sudden numbness or weakness of the face, arm or leg  signs and symptoms of a stroke like changes in vision; confusion; trouble speaking or understanding; severe headaches; sudden numbness or weakness of the face, arm or leg; trouble walking; dizziness; loss of balance or coordination Side effects that usually do not require medical attention (report to   your doctor or health care professional if they continue or are bothersome):  chills  cough  dizziness  fever  headaches  joint pain  muscle cramps  muscle pain  nausea, vomiting  pain, redness, or irritation at site where injected This list may not describe all possible side effects. Call your doctor for medical advice about side effects. You may report side effects to FDA at 1-800-FDA-1088. Where should I keep my medicine? Keep out of the reach of children. Store in a refrigerator between 2 and 8 degrees C (36 and 46 degrees F). Do not freeze or shake. Throw away any unused portion if using a single-dose vial. Multi-dose vials can be kept in the refrigerator for up to 21 days after the initial dose. Throw away unused medicine. NOTE: This sheet is a summary. It may not cover all possible information. If you have questions about this medicine,  talk to your doctor, pharmacist, or health care provider.  2020 Elsevier/Gold Standard (2017-04-16 08:35:19)  

## 2020-03-30 LAB — TYPE AND SCREEN
ABO/RH(D): O POS
Antibody Screen: NEGATIVE
Unit division: 0

## 2020-03-30 LAB — BPAM RBC
Blood Product Expiration Date: 202108012359
ISSUE DATE / TIME: 202107091503
Unit Type and Rh: 5100

## 2020-04-01 ENCOUNTER — Telehealth: Payer: Self-pay | Admitting: *Deleted

## 2020-04-01 ENCOUNTER — Telehealth: Payer: Self-pay | Admitting: Hematology and Oncology

## 2020-04-01 NOTE — Telephone Encounter (Signed)
Called to confirm 7/14 appt with pt - pt is aware of appts.

## 2020-04-01 NOTE — Telephone Encounter (Signed)
Received call from patient requesting clarification and confirmation of  upcoming appts. This was done. Pt lives in [place] and is asking of any appts can be made at his Nephrologist's office.  Advised that we have transfusion appts scheduled for him as well and advised him to ask that offcie if they do transfusions there. If they do, we can discuss this further.  Eric [name] states he will call them and find out. His next appt there is in August.

## 2020-04-02 ENCOUNTER — Telehealth: Payer: Self-pay | Admitting: *Deleted

## 2020-04-02 ENCOUNTER — Encounter: Payer: Self-pay | Admitting: Hematology and Oncology

## 2020-04-02 NOTE — Telephone Encounter (Signed)
Patient called requesting to have his blood/iron treatments to be done at the [facility].  He is willing to keep his appointment here for 05/13/2020 that includes a MD follow up.  He states that the [facility] campus is only a couple of minutes away whereas coming to [place] [address] trips over [address].  Routed to MD to advise if he is agreeable for patient to have his treatments done at the [facility].  Pending response.

## 2020-04-03 ENCOUNTER — Other Ambulatory Visit: Payer: Self-pay | Admitting: Hematology and Oncology

## 2020-04-03 ENCOUNTER — Inpatient Hospital Stay: Payer: Medicare Other

## 2020-04-03 ENCOUNTER — Other Ambulatory Visit: Payer: Self-pay

## 2020-04-03 ENCOUNTER — Other Ambulatory Visit: Payer: Self-pay | Admitting: Emergency Medicine

## 2020-04-03 VITALS — BP 134/49 | HR 52 | Temp 97.6°F | Resp 18

## 2020-04-03 DIAGNOSIS — D649 Anemia, unspecified: Secondary | ICD-10-CM

## 2020-04-03 LAB — CBC WITH DIFFERENTIAL (CANCER CENTER ONLY)
Abs Immature Granulocytes: 0.01 10*3/uL (ref 0.00–0.07)
Basophils Absolute: 0 10*3/uL (ref 0.0–0.1)
Basophils Relative: 1 %
Eosinophils Absolute: 0.1 10*3/uL (ref 0.0–0.5)
Eosinophils Relative: 3 %
HCT: 22.9 % — ABNORMAL LOW (ref 39.0–52.0)
Hemoglobin: 7.1 g/dL — ABNORMAL LOW (ref 13.0–17.0)
Immature Granulocytes: 0 %
Lymphocytes Relative: 17 %
Lymphs Abs: 0.6 10*3/uL — ABNORMAL LOW (ref 0.7–4.0)
MCH: 28.2 pg (ref 26.0–34.0)
MCHC: 31 g/dL (ref 30.0–36.0)
MCV: 90.9 fL (ref 80.0–100.0)
Monocytes Absolute: 0.5 10*3/uL (ref 0.1–1.0)
Monocytes Relative: 13 %
Neutro Abs: 2.6 10*3/uL (ref 1.7–7.7)
Neutrophils Relative %: 66 %
Platelet Count: 87 10*3/uL — ABNORMAL LOW (ref 150–400)
RBC: 2.52 MIL/uL — ABNORMAL LOW (ref 4.22–5.81)
RDW: 17.3 % — ABNORMAL HIGH (ref 11.5–15.5)
WBC Count: 3.9 10*3/uL — ABNORMAL LOW (ref 4.0–10.5)
nRBC: 0 % (ref 0.0–0.2)

## 2020-04-03 LAB — CMP (CANCER CENTER ONLY)
ALT: 14 U/L (ref 0–44)
AST: 14 U/L — ABNORMAL LOW (ref 15–41)
Albumin: 3.4 g/dL — ABNORMAL LOW (ref 3.5–5.0)
Alkaline Phosphatase: 136 U/L — ABNORMAL HIGH (ref 38–126)
Anion gap: 9 (ref 5–15)
BUN: 64 mg/dL — ABNORMAL HIGH (ref 8–23)
CO2: 21 mmol/L — ABNORMAL LOW (ref 22–32)
Calcium: 9.4 mg/dL (ref 8.9–10.3)
Chloride: 109 mmol/L (ref 98–111)
Creatinine: 2.29 mg/dL — ABNORMAL HIGH (ref 0.61–1.24)
GFR, Est AFR Am: 31 mL/min — ABNORMAL LOW (ref >60)
GFR, Estimated: 26 mL/min — ABNORMAL LOW (ref >60)
Glucose, Bld: 99 mg/dL (ref 70–99)
Potassium: 5 mmol/L (ref 3.5–5.1)
Sodium: 139 mmol/L (ref 135–145)
Total Bilirubin: 2.2 mg/dL — ABNORMAL HIGH (ref 0.3–1.2)
Total Protein: 6.8 g/dL (ref 6.5–8.1)

## 2020-04-03 LAB — SAMPLE TO BLOOD BANK

## 2020-04-03 LAB — RETIC PANEL
Immature Retic Fract: 19.3 % — ABNORMAL HIGH (ref 2.3–15.9)
RBC.: 2.49 MIL/uL — ABNORMAL LOW (ref 4.22–5.81)
Retic Count, Absolute: 138.9 10*3/uL (ref 19.0–186.0)
Retic Ct Pct: 5.6 % — ABNORMAL HIGH (ref 0.4–3.1)
Reticulocyte Hemoglobin: 31.3 pg (ref >27.9)

## 2020-04-03 LAB — PREPARE RBC (CROSSMATCH)

## 2020-04-03 MED ORDER — EPOETIN ALFA-EPBX 40000 UNIT/ML IJ SOLN: 20000.0000 [IU] | Freq: Once | INTRAMUSCULAR | Status: DC

## 2020-04-03 MED ORDER — SODIUM CHLORIDE 0.9 % IV SOLN
Freq: Once | INTRAVENOUS | Status: AC
  Filled 2020-04-03: qty 250

## 2020-04-03 MED ORDER — SODIUM CHLORIDE 0.9 % IV SOLN
510.0000 mg | Freq: Once | INTRAVENOUS | Status: AC
  Administered 2020-04-03: 510 mg via INTRAVENOUS
  Filled 2020-04-03: qty 17

## 2020-04-03 MED ORDER — SODIUM CHLORIDE 0.9% IV SOLUTION
250.0000 mL | Freq: Once | INTRAVENOUS | Status: AC
  Administered 2020-04-03: 250 mL via INTRAVENOUS
  Filled 2020-04-03: qty 250

## 2020-04-03 NOTE — Patient Instructions (Addendum)
Blood Transfusion, Adult, Care After This sheet gives you information about how to care for yourself after your procedure. Your doctor may also give you more specific instructions. If you have problems or questions, contact your doctor. What can I expect after the procedure? After the procedure, it is common to have:  Bruising and soreness at the IV site.  A fever or chills on the day of the procedure. This may be your body's response to the new blood cells received.  A headache. Follow these instructions at home: Insertion site care      Follow instructions from your doctor about how to take care of your insertion site. This is where an IV tube was put into your vein. Make sure you: ? Wash your hands with soap and water before and after you change your bandage (dressing). If you cannot use soap and water, use hand sanitizer. ? Change your bandage as told by your doctor.  Check your insertion site every day for signs of infection. Check for: ? Redness, swelling, or pain. ? Bleeding from the site. ? Warmth. ? Pus or a bad smell. General instructions  Take over-the-counter and prescription medicines only as told by your doctor.  Rest as told by your doctor.  Go back to your normal activities as told by your doctor.  Keep all follow-up visits as told by your doctor. This is important. Contact a doctor if:  You have itching or red, swollen areas of skin (hives).  You feel worried or nervous (anxious).  You feel weak after doing your normal activities.  You have redness, swelling, warmth, or pain around the insertion site.  You have blood coming from the insertion site, and the blood does not stop with pressure.  You have pus or a bad smell coming from the insertion site. Get help right away if:  You have signs of a serious reaction. This may be coming from an allergy or the body's defense system (immune system). Signs include: ? Trouble breathing or shortness of  breath. ? Swelling of the face or feeling warm (flushed). ? Fever or chills. ? Head, chest, or back pain. ? Dark pee (urine) or blood in the pee. ? Widespread rash. ? Fast heartbeat. ? Feeling dizzy or light-headed. You may receive your blood transfusion in an outpatient setting. If so, you will be told whom to contact to report any reactions. These symptoms may be an emergency. Do not wait to see if the symptoms will go away. Get medical help right away. Call your local emergency services (911 in the [place] Do not drive yourself to the hospital. Summary  Bruising and soreness at the IV site are common.  Check your insertion site every day for signs of infection.  Rest as told by your doctor. Go back to your normal activities as told by your doctor.  Get help right away if you have signs of a serious reaction. This information is not intended to replace advice given to you by your health care provider. Make sure you discuss any questions you have with your health care provider. Document Revised: 03/02/2019 Document Reviewed: 03/02/2019 Elsevier Patient Education  [place].    Ferumoxytol injection What is this medicine? FERUMOXYTOL is an iron complex. Iron is used to make healthy red blood cells, which carry oxygen and nutrients throughout the body. This medicine is used to treat iron deficiency anemia. This medicine may be used for other purposes; ask your health care provider or pharmacist if  you have questions. COMMON BRAND NAME(S): Feraheme What should I tell my health care provider before I take this medicine? They need to know if you have any of these conditions:  anemia not caused by low iron levels  high levels of iron in the blood  magnetic resonance imaging (MRI) test scheduled  an unusual or allergic reaction to iron, other medicines, foods, dyes, or preservatives  pregnant or trying to get pregnant  breast-feeding How should I use this medicine? This  medicine is for injection into a vein. It is given by a health care professional in a hospital or clinic setting. Talk to your pediatrician regarding the use of this medicine in children. Special care may be needed. Overdosage: If you think you have taken too much of this medicine contact a poison control center or emergency room at once. NOTE: This medicine is only for you. Do not share this medicine with others. What if I miss a dose? It is important not to miss your dose. Call your doctor or health care professional if you are unable to keep an appointment. What may interact with this medicine? This medicine may interact with the following medications:  other iron products This list may not describe all possible interactions. Give your health care provider a list of all the medicines, herbs, non-prescription drugs, or dietary supplements you use. Also tell them if you smoke, drink alcohol, or use illegal drugs. Some items may interact with your medicine. What should I watch for while using this medicine? Visit your doctor or healthcare professional regularly. Tell your doctor or healthcare professional if your symptoms do not start to get better or if they get worse. You may need blood work done while you are taking this medicine. You may need to follow a special diet. Talk to your doctor. Foods that contain iron include: whole grains/cereals, dried fruits, beans, or peas, leafy green vegetables, and organ meats (liver, kidney). What side effects may I notice from receiving this medicine? Side effects that you should report to your doctor or health care professional as soon as possible:  allergic reactions like skin rash, itching or hives, swelling of the face, lips, or tongue  breathing problems  changes in blood pressure  feeling faint or lightheaded, falls  fever or chills  flushing, sweating, or hot feelings  swelling of the ankles or feet Side effects that usually do not require  medical attention (report to your doctor or health care professional if they continue or are bothersome):  diarrhea  headache  nausea, vomiting  stomach pain This list may not describe all possible side effects. Call your doctor for medical advice about side effects. You may report side effects to FDA at 1-800-FDA-1088. Where should I keep my medicine? This drug is given in a hospital or clinic and will not be stored at home. NOTE: This sheet is a summary. It may not cover all possible information. If you have questions about this medicine, talk to your doctor, pharmacist, or health care provider.  2020 Elsevier/Gold Standard (2016-10-26 20:21:10)

## 2020-04-03 NOTE — Progress Notes (Signed)
Pt received IV feraheme and 1 unit PRBCs today, tolerated well.  VSS.  Able to eat/drink and use restroom during tx w/out any issues.  Pt stayed for entire post infusion observation period (30 min).

## 2020-04-04 LAB — TYPE AND SCREEN
ABO/RH(D): O POS
Antibody Screen: NEGATIVE
Unit division: 0

## 2020-04-04 LAB — BPAM RBC
Blood Product Expiration Date: 202108062359
ISSUE DATE / TIME: 202107141026
Unit Type and Rh: 5100

## 2020-04-11 ENCOUNTER — Inpatient Hospital Stay: Payer: Medicare Other

## 2020-04-11 ENCOUNTER — Other Ambulatory Visit: Payer: Self-pay

## 2020-04-11 ENCOUNTER — Other Ambulatory Visit: Payer: Self-pay | Admitting: *Deleted

## 2020-04-11 DIAGNOSIS — D649 Anemia, unspecified: Secondary | ICD-10-CM

## 2020-04-11 LAB — SAMPLE TO BLOOD BANK

## 2020-04-11 LAB — CMP (CANCER CENTER ONLY)
ALT: 9 U/L (ref 0–44)
AST: 13 U/L — ABNORMAL LOW (ref 15–41)
Albumin: 4.1 g/dL (ref 3.5–5.0)
Alkaline Phosphatase: 116 U/L (ref 38–126)
Anion gap: 7 (ref 5–15)
BUN: 79 mg/dL — ABNORMAL HIGH (ref 8–23)
CO2: 23 mmol/L (ref 22–32)
Calcium: 9.6 mg/dL (ref 8.9–10.3)
Chloride: 108 mmol/L (ref 98–111)
Creatinine: 2.66 mg/dL — ABNORMAL HIGH (ref 0.61–1.24)
GFR, Est AFR Am: 25 mL/min — ABNORMAL LOW (ref >60)
GFR, Estimated: 22 mL/min — ABNORMAL LOW (ref >60)
Glucose, Bld: 133 mg/dL — ABNORMAL HIGH (ref 70–99)
Potassium: 6 mmol/L — ABNORMAL HIGH (ref 3.5–5.1)
Sodium: 138 mmol/L (ref 135–145)
Total Bilirubin: 2.3 mg/dL — ABNORMAL HIGH (ref 0.3–1.2)
Total Protein: 6.9 g/dL (ref 6.5–8.1)

## 2020-04-11 LAB — CBC WITH DIFFERENTIAL (CANCER CENTER ONLY)
Abs Immature Granulocytes: 0.03 10*3/uL (ref 0.00–0.07)
Basophils Absolute: 0 10*3/uL (ref 0.0–0.1)
Basophils Relative: 0 %
Eosinophils Absolute: 0.1 10*3/uL (ref 0.0–0.5)
Eosinophils Relative: 2 %
HCT: 22.8 % — ABNORMAL LOW (ref 39.0–52.0)
Hemoglobin: 7 g/dL — ABNORMAL LOW (ref 13.0–17.0)
Immature Granulocytes: 1 %
Lymphocytes Relative: 17 %
Lymphs Abs: 0.9 10*3/uL (ref 0.7–4.0)
MCH: 30.2 pg (ref 26.0–34.0)
MCHC: 30.7 g/dL (ref 30.0–36.0)
MCV: 98.3 fL (ref 80.0–100.0)
Monocytes Absolute: 0.5 10*3/uL (ref 0.1–1.0)
Monocytes Relative: 9 %
Neutro Abs: 3.9 10*3/uL (ref 1.7–7.7)
Neutrophils Relative %: 71 %
Platelet Count: 96 10*3/uL — ABNORMAL LOW (ref 150–400)
RBC: 2.32 MIL/uL — ABNORMAL LOW (ref 4.22–5.81)
RDW: 21.5 % — ABNORMAL HIGH (ref 11.5–15.5)
WBC Count: 5.5 10*3/uL (ref 4.0–10.5)
nRBC: 0 % (ref 0.0–0.2)

## 2020-04-12 ENCOUNTER — Other Ambulatory Visit: Payer: Medicare Other

## 2020-04-12 ENCOUNTER — Inpatient Hospital Stay: Payer: Medicare Other

## 2020-04-12 ENCOUNTER — Other Ambulatory Visit: Payer: Self-pay | Admitting: *Deleted

## 2020-04-12 ENCOUNTER — Ambulatory Visit: Payer: Medicare Other

## 2020-04-12 VITALS — BP 119/41 | HR 49 | Temp 97.6°F | Resp 18

## 2020-04-12 DIAGNOSIS — D649 Anemia, unspecified: Secondary | ICD-10-CM

## 2020-04-12 DIAGNOSIS — D696 Thrombocytopenia, unspecified: Secondary | ICD-10-CM

## 2020-04-12 LAB — TYPE AND SCREEN
ABO/RH(D): O POS
Antibody Screen: NEGATIVE
Unit division: 0

## 2020-04-12 LAB — BPAM RBC
Blood Product Expiration Date: 202108232359
Unit Type and Rh: 5100

## 2020-04-12 LAB — PREPARE RBC (CROSSMATCH)

## 2020-04-12 MED ORDER — ACETAMINOPHEN 325 MG PO TABS
650.0000 mg | ORAL_TABLET | Freq: Once | ORAL | Status: AC
  Administered 2020-04-12: 650 mg via ORAL

## 2020-04-12 MED ORDER — EPOETIN ALFA-EPBX 40000 UNIT/ML IJ SOLN
20000.0000 [IU] | Freq: Once | INTRAMUSCULAR | Status: DC
  Administered 2020-04-12: 20000 [IU] via SUBCUTANEOUS

## 2020-04-12 MED ORDER — ACETAMINOPHEN 325 MG PO TABS
ORAL_TABLET | ORAL | Status: AC
  Filled 2020-04-12: qty 2

## 2020-04-12 MED ORDER — EPOETIN ALFA-EPBX 40000 UNIT/ML IJ SOLN: 20000.0000 [IU] | Freq: Once | INTRAMUSCULAR | Status: DC

## 2020-04-12 MED ORDER — SODIUM CHLORIDE 0.9% IV SOLUTION
250.0000 mL | Freq: Once | INTRAVENOUS | Status: AC
  Administered 2020-04-12: 250 mL via INTRAVENOUS
  Filled 2020-04-12: qty 250

## 2020-04-12 NOTE — Patient Instructions (Signed)
Blood Transfusion, Adult, Care After This sheet gives you information about how to care for yourself after your procedure. Your doctor may also give you more specific instructions. If you have problems or questions, contact your doctor. What can I expect after the procedure? After the procedure, it is common to have:  Bruising and soreness at the IV site.  A fever or chills on the day of the procedure. This may be your body's response to the new blood cells received.  A headache. Follow these instructions at home: Insertion site care      Follow instructions from your doctor about how to take care of your insertion site. This is where an IV tube was put into your vein. Make sure you: ? Wash your hands with soap and water before and after you change your bandage (dressing). If you cannot use soap and water, use hand sanitizer. ? Change your bandage as told by your doctor.  Check your insertion site every day for signs of infection. Check for: ? Redness, swelling, or pain. ? Bleeding from the site. ? Warmth. ? Pus or a bad smell. General instructions  Take over-the-counter and prescription medicines only as told by your doctor.  Rest as told by your doctor.  Go back to your normal activities as told by your doctor.  Keep all follow-up visits as told by your doctor. This is important. Contact a doctor if:  You have itching or red, swollen areas of skin (hives).  You feel worried or nervous (anxious).  You feel weak after doing your normal activities.  You have redness, swelling, warmth, or pain around the insertion site.  You have blood coming from the insertion site, and the blood does not stop with pressure.  You have pus or a bad smell coming from the insertion site. Get help right away if:  You have signs of a serious reaction. This may be coming from an allergy or the body's defense system (immune system). Signs include: ? Trouble breathing or shortness of  breath. ? Swelling of the face or feeling warm (flushed). ? Fever or chills. ? Head, chest, or back pain. ? Dark pee (urine) or blood in the pee. ? Widespread rash. ? Fast heartbeat. ? Feeling dizzy or light-headed. You may receive your blood transfusion in an outpatient setting. If so, you will be told whom to contact to report any reactions. These symptoms may be an emergency. Do not wait to see if the symptoms will go away. Get medical help right away. Call your local emergency services (911 in the [place] Do not drive yourself to the hospital. Summary  Bruising and soreness at the IV site are common.  Check your insertion site every day for signs of infection.  Rest as told by your doctor. Go back to your normal activities as told by your doctor.  Get help right away if you have signs of a serious reaction. This information is not intended to replace advice given to you by your health care provider. Make sure you discuss any questions you have with your health care provider. Document Revised: 03/02/2019 Document Reviewed: 03/02/2019 Elsevier Patient Education  [place].    Ferumoxytol injection What is this medicine? FERUMOXYTOL is an iron complex. Iron is used to make healthy red blood cells, which carry oxygen and nutrients throughout the body. This medicine is used to treat iron deficiency anemia. This medicine may be used for other purposes; ask your health care provider or pharmacist if  you have questions. COMMON BRAND NAME(S): Feraheme What should I tell my health care provider before I take this medicine? They need to know if you have any of these conditions:  anemia not caused by low iron levels  high levels of iron in the blood  magnetic resonance imaging (MRI) test scheduled  an unusual or allergic reaction to iron, other medicines, foods, dyes, or preservatives  pregnant or trying to get pregnant  breast-feeding How should I use this medicine? This  medicine is for injection into a vein. It is given by a health care professional in a hospital or clinic setting. Talk to your pediatrician regarding the use of this medicine in children. Special care may be needed. Overdosage: If you think you have taken too much of this medicine contact a poison control center or emergency room at once. NOTE: This medicine is only for you. Do not share this medicine with others. What if I miss a dose? It is important not to miss your dose. Call your doctor or health care professional if you are unable to keep an appointment. What may interact with this medicine? This medicine may interact with the following medications:  other iron products This list may not describe all possible interactions. Give your health care provider a list of all the medicines, herbs, non-prescription drugs, or dietary supplements you use. Also tell them if you smoke, drink alcohol, or use illegal drugs. Some items may interact with your medicine. What should I watch for while using this medicine? Visit your doctor or healthcare professional regularly. Tell your doctor or healthcare professional if your symptoms do not start to get better or if they get worse. You may need blood work done while you are taking this medicine. You may need to follow a special diet. Talk to your doctor. Foods that contain iron include: whole grains/cereals, dried fruits, beans, or peas, leafy green vegetables, and organ meats (liver, kidney). What side effects may I notice from receiving this medicine? Side effects that you should report to your doctor or health care professional as soon as possible:  allergic reactions like skin rash, itching or hives, swelling of the face, lips, or tongue  breathing problems  changes in blood pressure  feeling faint or lightheaded, falls  fever or chills  flushing, sweating, or hot feelings  swelling of the ankles or feet Side effects that usually do not require  medical attention (report to your doctor or health care professional if they continue or are bothersome):  diarrhea  headache  nausea, vomiting  stomach pain This list may not describe all possible side effects. Call your doctor for medical advice about side effects. You may report side effects to FDA at 1-800-FDA-1088. Where should I keep my medicine? This drug is given in a hospital or clinic and will not be stored at home. NOTE: This sheet is a summary. It may not cover all possible information. If you have questions about this medicine, talk to your doctor, pharmacist, or health care provider.  2020 Elsevier/Gold Standard (2016-10-26 20:21:10)

## 2020-04-14 LAB — BPAM RBC
Blood Product Expiration Date: 202108232359
ISSUE DATE / TIME: 202107230811
Unit Type and Rh: 5100

## 2020-04-14 LAB — TYPE AND SCREEN
ABO/RH(D): O POS
Antibody Screen: NEGATIVE
Unit division: 0

## 2020-04-15 ENCOUNTER — Other Ambulatory Visit: Payer: Self-pay | Admitting: Hematology & Oncology

## 2020-04-17 ENCOUNTER — Other Ambulatory Visit: Payer: Self-pay | Admitting: *Deleted

## 2020-04-17 DIAGNOSIS — D649 Anemia, unspecified: Secondary | ICD-10-CM

## 2020-04-18 ENCOUNTER — Other Ambulatory Visit: Payer: Self-pay | Admitting: *Deleted

## 2020-04-18 ENCOUNTER — Other Ambulatory Visit: Payer: Self-pay

## 2020-04-18 ENCOUNTER — Inpatient Hospital Stay: Payer: Medicare Other

## 2020-04-18 DIAGNOSIS — D649 Anemia, unspecified: Secondary | ICD-10-CM

## 2020-04-18 DIAGNOSIS — D696 Thrombocytopenia, unspecified: Secondary | ICD-10-CM

## 2020-04-18 LAB — COMPREHENSIVE METABOLIC PANEL
ALT: 11 U/L (ref 0–44)
AST: 16 U/L (ref 15–41)
Albumin: 3.9 g/dL (ref 3.5–5.0)
Alkaline Phosphatase: 120 U/L (ref 38–126)
Anion gap: 8 (ref 5–15)
BUN: 80 mg/dL — ABNORMAL HIGH (ref 8–23)
CO2: 22 mmol/L (ref 22–32)
Calcium: 9.2 mg/dL (ref 8.9–10.3)
Chloride: 108 mmol/L (ref 98–111)
Creatinine, Ser: 2.7 mg/dL — ABNORMAL HIGH (ref 0.61–1.24)
GFR calc Af Amer: 25 mL/min — ABNORMAL LOW (ref >60)
GFR calc non Af Amer: 21 mL/min — ABNORMAL LOW (ref >60)
Glucose, Bld: 202 mg/dL — ABNORMAL HIGH (ref 70–99)
Potassium: 5.5 mmol/L — ABNORMAL HIGH (ref 3.5–5.1)
Sodium: 138 mmol/L (ref 135–145)
Total Bilirubin: 2.6 mg/dL — ABNORMAL HIGH (ref 0.3–1.2)
Total Protein: 6.7 g/dL (ref 6.5–8.1)

## 2020-04-18 LAB — CBC WITH DIFFERENTIAL (CANCER CENTER ONLY)
Abs Immature Granulocytes: 0.03 10*3/uL (ref 0.00–0.07)
Basophils Absolute: 0 10*3/uL (ref 0.0–0.1)
Basophils Relative: 1 %
Eosinophils Absolute: 0.1 10*3/uL (ref 0.0–0.5)
Eosinophils Relative: 2 %
HCT: 22.4 % — ABNORMAL LOW (ref 39.0–52.0)
Hemoglobin: 7.1 g/dL — ABNORMAL LOW (ref 13.0–17.0)
Immature Granulocytes: 1 %
Lymphocytes Relative: 15 %
Lymphs Abs: 0.8 10*3/uL (ref 0.7–4.0)
MCH: 31.4 pg (ref 26.0–34.0)
MCHC: 31.7 g/dL (ref 30.0–36.0)
MCV: 99.1 fL (ref 80.0–100.0)
Monocytes Absolute: 0.6 10*3/uL (ref 0.1–1.0)
Monocytes Relative: 11 %
Neutro Abs: 3.9 10*3/uL (ref 1.7–7.7)
Neutrophils Relative %: 70 %
Platelet Count: 85 10*3/uL — ABNORMAL LOW (ref 150–400)
RBC: 2.26 MIL/uL — ABNORMAL LOW (ref 4.22–5.81)
RDW: 20.2 % — ABNORMAL HIGH (ref 11.5–15.5)
WBC Count: 5.6 10*3/uL (ref 4.0–10.5)
nRBC: 0 % (ref 0.0–0.2)

## 2020-04-18 LAB — SAMPLE TO BLOOD BANK

## 2020-04-18 LAB — PREPARE RBC (CROSSMATCH)

## 2020-04-19 ENCOUNTER — Other Ambulatory Visit: Payer: Medicare Other

## 2020-04-19 ENCOUNTER — Emergency Department (HOSPITAL_BASED_OUTPATIENT_CLINIC_OR_DEPARTMENT_OTHER): Payer: Medicare Other

## 2020-04-19 ENCOUNTER — Inpatient Hospital Stay: Payer: Medicare Other

## 2020-04-19 ENCOUNTER — Encounter (HOSPITAL_BASED_OUTPATIENT_CLINIC_OR_DEPARTMENT_OTHER): Payer: Self-pay | Admitting: Emergency Medicine

## 2020-04-19 ENCOUNTER — Other Ambulatory Visit: Payer: Self-pay

## 2020-04-19 ENCOUNTER — Emergency Department (HOSPITAL_BASED_OUTPATIENT_CLINIC_OR_DEPARTMENT_OTHER)
Admission: EM | Admit: 2020-04-19 | Discharge: 2020-04-19 | Disposition: A | Discharge status: Home / Self Care | Payer: Medicare Other | Attending: Emergency Medicine | Admitting: Emergency Medicine

## 2020-04-19 VITALS — BP 113/36 | HR 55 | Temp 98.8°F | Resp 17

## 2020-04-19 DIAGNOSIS — R600 Localized edema: Secondary | ICD-10-CM | POA: Insufficient documentation

## 2020-04-19 DIAGNOSIS — Z20822 Contact with and (suspected) exposure to covid-19: Secondary | ICD-10-CM | POA: Insufficient documentation

## 2020-04-19 DIAGNOSIS — D649 Anemia, unspecified: Secondary | ICD-10-CM

## 2020-04-19 DIAGNOSIS — J90 Pleural effusion, not elsewhere classified: Secondary | ICD-10-CM | POA: Insufficient documentation

## 2020-04-19 DIAGNOSIS — R0602 Shortness of breath: Secondary | ICD-10-CM | POA: Insufficient documentation

## 2020-04-19 DIAGNOSIS — Z7901 Long term (current) use of anticoagulants: Secondary | ICD-10-CM | POA: Insufficient documentation

## 2020-04-19 DIAGNOSIS — Z794 Long term (current) use of insulin: Secondary | ICD-10-CM | POA: Diagnosis not present

## 2020-04-19 DIAGNOSIS — D696 Thrombocytopenia, unspecified: Secondary | ICD-10-CM

## 2020-04-19 DIAGNOSIS — N289 Disorder of kidney and ureter, unspecified: Secondary | ICD-10-CM | POA: Diagnosis not present

## 2020-04-19 DIAGNOSIS — Z96641 Presence of right artificial hip joint: Secondary | ICD-10-CM | POA: Diagnosis not present

## 2020-04-19 DIAGNOSIS — I5030 Unspecified diastolic (congestive) heart failure: Secondary | ICD-10-CM | POA: Diagnosis not present

## 2020-04-19 HISTORY — DX: Unspecified diastolic (congestive) heart failure: I50.30

## 2020-04-19 HISTORY — DX: Chronic atrial fibrillation, unspecified: I48.20

## 2020-04-19 HISTORY — DX: Anemia, unspecified: D64.9

## 2020-04-19 HISTORY — DX: Localized edema: R60.0

## 2020-04-19 LAB — COMPREHENSIVE METABOLIC PANEL
ALT: 15 U/L (ref 0–44)
AST: 18 U/L (ref 15–41)
Albumin: 3.5 g/dL (ref 3.5–5.0)
Alkaline Phosphatase: 115 U/L (ref 38–126)
Anion gap: 11 (ref 5–15)
BUN: 80 mg/dL — ABNORMAL HIGH (ref 8–23)
CO2: 21 mmol/L — ABNORMAL LOW (ref 22–32)
Calcium: 8.2 mg/dL — ABNORMAL LOW (ref 8.9–10.3)
Chloride: 105 mmol/L (ref 98–111)
Creatinine, Ser: 2.82 mg/dL — ABNORMAL HIGH (ref 0.61–1.24)
GFR calc Af Amer: 24 mL/min — ABNORMAL LOW (ref >60)
GFR calc non Af Amer: 20 mL/min — ABNORMAL LOW (ref >60)
Glucose, Bld: 192 mg/dL — ABNORMAL HIGH (ref 70–99)
Potassium: 5.2 mmol/L — ABNORMAL HIGH (ref 3.5–5.1)
Sodium: 137 mmol/L (ref 135–145)
Total Bilirubin: 2.3 mg/dL — ABNORMAL HIGH (ref 0.3–1.2)
Total Protein: 6.6 g/dL (ref 6.5–8.1)

## 2020-04-19 LAB — CBC WITH DIFFERENTIAL/PLATELET
Abs Immature Granulocytes: 0.03 10*3/uL (ref 0.00–0.07)
Basophils Absolute: 0 10*3/uL (ref 0.0–0.1)
Basophils Relative: 1 %
Eosinophils Absolute: 0.1 10*3/uL (ref 0.0–0.5)
Eosinophils Relative: 2 %
HCT: 21.3 % — ABNORMAL LOW (ref 39.0–52.0)
Hemoglobin: 6.8 g/dL — CL (ref 13.0–17.0)
Immature Granulocytes: 1 %
Lymphocytes Relative: 15 %
Lymphs Abs: 0.6 10*3/uL — ABNORMAL LOW (ref 0.7–4.0)
MCH: 31.2 pg (ref 26.0–34.0)
MCHC: 31.9 g/dL (ref 30.0–36.0)
MCV: 97.7 fL (ref 80.0–100.0)
Monocytes Absolute: 0.5 10*3/uL (ref 0.1–1.0)
Monocytes Relative: 11 %
Neutro Abs: 3 10*3/uL (ref 1.7–7.7)
Neutrophils Relative %: 70 %
Platelets: 96 10*3/uL — ABNORMAL LOW (ref 150–400)
RBC: 2.18 MIL/uL — ABNORMAL LOW (ref 4.22–5.81)
RDW: 20.6 % — ABNORMAL HIGH (ref 11.5–15.5)
WBC: 4.2 10*3/uL (ref 4.0–10.5)
nRBC: 0 % (ref 0.0–0.2)

## 2020-04-19 LAB — TROPONIN I (HIGH SENSITIVITY): Troponin I (High Sensitivity): 10 ng/L (ref <18)

## 2020-04-19 LAB — SARS CORONAVIRUS 2 BY RT PCR (HOSPITAL ORDER, PERFORMED IN ~~LOC~~ HOSPITAL LAB): SARS Coronavirus 2: NEGATIVE

## 2020-04-19 LAB — BRAIN NATRIURETIC PEPTIDE: B Natriuretic Peptide: 145.1 pg/mL — ABNORMAL HIGH (ref 0.0–100.0)

## 2020-04-19 MED ORDER — SODIUM CHLORIDE 0.9% IV SOLUTION
250.0000 mL | Freq: Once | INTRAVENOUS | Status: DC
  Filled 2020-04-19: qty 250

## 2020-04-19 MED ORDER — SODIUM CHLORIDE 0.9 % IV SOLN
Freq: Once | INTRAVENOUS | Status: DC
  Filled 2020-04-19: qty 250

## 2020-04-19 MED ORDER — SODIUM CHLORIDE 0.9 % IV SOLN
510.0000 mg | Freq: Once | INTRAVENOUS | Status: AC
  Administered 2020-04-19: 510 mg via INTRAVENOUS
  Filled 2020-04-19: qty 510

## 2020-04-19 NOTE — Patient Instructions (Signed)
Blood Transfusion, Adult, Care After This sheet gives you information about how to care for yourself after your procedure. Your doctor may also give you more specific instructions. If you have problems or questions, contact your doctor. What can I expect after the procedure? After the procedure, it is common to have:  Bruising and soreness at the IV site.  A fever or chills on the day of the procedure. This may be your body's response to the new blood cells received.  A headache. Follow these instructions at home: Insertion site care      Follow instructions from your doctor about how to take care of your insertion site. This is where an IV tube was put into your vein. Make sure you: ? Wash your hands with soap and water before and after you change your bandage (dressing). If you cannot use soap and water, use hand sanitizer. ? Change your bandage as told by your doctor.  Check your insertion site every day for signs of infection. Check for: ? Redness, swelling, or pain. ? Bleeding from the site. ? Warmth. ? Pus or a bad smell. General instructions  Take over-the-counter and prescription medicines only as told by your doctor.  Rest as told by your doctor.  Go back to your normal activities as told by your doctor.  Keep all follow-up visits as told by your doctor. This is important. Contact a doctor if:  You have itching or red, swollen areas of skin (hives).  You feel worried or nervous (anxious).  You feel weak after doing your normal activities.  You have redness, swelling, warmth, or pain around the insertion site.  You have blood coming from the insertion site, and the blood does not stop with pressure.  You have pus or a bad smell coming from the insertion site. Get help right away if:  You have signs of a serious reaction. This may be coming from an allergy or the body's defense system (immune system). Signs include: ? Trouble breathing or shortness of  breath. ? Swelling of the face or feeling warm (flushed). ? Fever or chills. ? Head, chest, or back pain. ? Dark pee (urine) or blood in the pee. ? Widespread rash. ? Fast heartbeat. ? Feeling dizzy or light-headed. You may receive your blood transfusion in an outpatient setting. If so, you will be told whom to contact to report any reactions. These symptoms may be an emergency. Do not wait to see if the symptoms will go away. Get medical help right away. Call your local emergency services (911 in the [place] Do not drive yourself to the hospital. Summary  Bruising and soreness at the IV site are common.  Check your insertion site every day for signs of infection.  Rest as told by your doctor. Go back to your normal activities as told by your doctor.  Get help right away if you have signs of a serious reaction. This information is not intended to replace advice given to you by your health care provider. Make sure you discuss any questions you have with your health care provider. Document Revised: 03/02/2019 Document Reviewed: 03/02/2019 Elsevier Patient Education  [place].    Ferumoxytol injection What is this medicine? FERUMOXYTOL is an iron complex. Iron is used to make healthy red blood cells, which carry oxygen and nutrients throughout the body. This medicine is used to treat iron deficiency anemia. This medicine may be used for other purposes; ask your health care provider or pharmacist if  you have questions. COMMON BRAND NAME(S): Feraheme What should I tell my health care provider before I take this medicine? They need to know if you have any of these conditions:  anemia not caused by low iron levels  high levels of iron in the blood  magnetic resonance imaging (MRI) test scheduled  an unusual or allergic reaction to iron, other medicines, foods, dyes, or preservatives  pregnant or trying to get pregnant  breast-feeding How should I use this medicine? This  medicine is for injection into a vein. It is given by a health care professional in a hospital or clinic setting. Talk to your pediatrician regarding the use of this medicine in children. Special care may be needed. Overdosage: If you think you have taken too much of this medicine contact a poison control center or emergency room at once. NOTE: This medicine is only for you. Do not share this medicine with others. What if I miss a dose? It is important not to miss your dose. Call your doctor or health care professional if you are unable to keep an appointment. What may interact with this medicine? This medicine may interact with the following medications:  other iron products This list may not describe all possible interactions. Give your health care provider a list of all the medicines, herbs, non-prescription drugs, or dietary supplements you use. Also tell them if you smoke, drink alcohol, or use illegal drugs. Some items may interact with your medicine. What should I watch for while using this medicine? Visit your doctor or healthcare professional regularly. Tell your doctor or healthcare professional if your symptoms do not start to get better or if they get worse. You may need blood work done while you are taking this medicine. You may need to follow a special diet. Talk to your doctor. Foods that contain iron include: whole grains/cereals, dried fruits, beans, or peas, leafy green vegetables, and organ meats (liver, kidney). What side effects may I notice from receiving this medicine? Side effects that you should report to your doctor or health care professional as soon as possible:  allergic reactions like skin rash, itching or hives, swelling of the face, lips, or tongue  breathing problems  changes in blood pressure  feeling faint or lightheaded, falls  fever or chills  flushing, sweating, or hot feelings  swelling of the ankles or feet Side effects that usually do not require  medical attention (report to your doctor or health care professional if they continue or are bothersome):  diarrhea  headache  nausea, vomiting  stomach pain This list may not describe all possible side effects. Call your doctor for medical advice about side effects. You may report side effects to FDA at 1-800-FDA-1088. Where should I keep my medicine? This drug is given in a hospital or clinic and will not be stored at home. NOTE: This sheet is a summary. It may not cover all possible information. If you have questions about this medicine, talk to your doctor, pharmacist, or health care provider.  2020 Elsevier/Gold Standard (2016-10-26 20:21:10)

## 2020-04-19 NOTE — ED Provider Notes (Signed)
[facility] Provider Note: Georgena Spurling, MD, FACEP  CSN: [id number] MRN: [id number] ARRIVAL: 04/19/20 at [place]: MH12/MH12   CHIEF COMPLAINT  Leg Swelling and Shortness of Breath   HISTORY OF PRESENT ILLNESS  04/19/20 4:20 AM Eric [name] is a 79 y.o. male with chronic anemia who is scheduled to have a blood transfusion upstairs in this building (at the hematology/oncology office) later this morning.  He also has chronic edema of the lower legs.  He is here because of acutely worsening shortness of breath.  He has had shortness of breath associated with his anemia for the past several months but it acutely worsened.  He denies any chest pain.  Shortness of breath is worse with exertion.  He is also had some weeping associated with the edema of his legs.  He denies fever, cough, nasal congestion or body aches.   Past Medical History:  Diagnosis Date  . Anemia   . Bilateral lower extremity edema   . Chronic atrial fibrillation ([place])   . Diastolic heart failure ([place])   . Gall bladder disease 2020  . History of right hip replacement    Pt stated 8-9 yrs ago    Past Surgical History:  Procedure Laterality Date  . APPENDECTOMY    . CHOLECYSTECTOMY    . TOTAL HIP ARTHROPLASTY      History reviewed. No pertinent family history.  Social History   Tobacco Use  . Smoking status: Never Smoker  . Smokeless tobacco: Never Used  Substance Use Topics  . Alcohol use: Never  . Drug use: Never    Prior to Admission medications   Medication Sig Start Date End Date Taking? Authorizing Provider  acetaminophen (TYLENOL) 325 MG tablet Take 650 mg by mouth every 6 (six) hours as needed for moderate pain.     [provider]  allopurinol (ZYLOPRIM) 300 MG tablet Take 300 mg by mouth daily.    [provider]  augmented betamethasone dipropionate (DIPROLENE-AF) 0.05 % cream Apply topically 2 (two) times daily as needed (rash).  01/08/20   [provider]  diltiazem (TIAZAC) 360 MG 24 hr capsule Take 360 mg by mouth daily. 11/27/19   [provider]  ELIQUIS 5 MG TABS tablet Take 5 mg by mouth 2 (two) times daily. Taking once daily 01/14/20   [provider]  insulin glargine, 1 Unit Dial, (TOUJEO SOLOSTAR) 300 UNIT/ML Solostar Pen Inject 55 Units into the skin daily.  08/31/15   [provider]  insulin lispro (HUMALOG) 100 UNIT/ML KwikPen Inject 19 Units into the skin 3 (three) times daily.  04/15/15   [provider]  levothyroxine (SYNTHROID) 100 MCG tablet Take 50-100 mcg by mouth See admin instructions. Takes 100 mg daily except Sundays, on Sunday takes 50 mg 01/14/20   [provider]  silodosin (RAPAFLO) 8 MG CAPS capsule Take 8 mg by mouth daily.  12/13/19   [provider]  simvastatin (ZOCOR) 20 MG tablet Take 20 mg by mouth at bedtime. 11/27/19   [provider]  spironolactone (ALDACTONE) 50 MG tablet Take 50 mg by mouth daily. 11/27/19   [provider]  torsemide (DEMADEX) 20 MG tablet Take 20 mg by mouth daily. 08/31/19   [provider]  traZODone (DESYREL) 50 MG tablet  03/14/20   [provider]    Allergies Hydrocodone, Iohexol, Morphine and related, Sulfa antibiotics, Sulfonamide derivatives, and Trazodone and nefazodone   REVIEW OF SYSTEMS  Negative except  as noted here or in the History of Present Illness.   PHYSICAL EXAMINATION  Initial Vital Signs Blood pressure (!) 141/49, pulse 57, temperature 97.8 F (36.6 C), temperature source Oral, resp. rate 20, SpO2 98 %.  Examination General: Well-developed, well-nourished male in no acute distress; appearance consistent with age of record HENT: normocephalic; atraumatic Eyes: pupils equal, round and reactive to light; extraocular muscles intact Neck: supple Heart: Irregular rhythm Lungs: Decreased sounds right base Abdomen: soft; nondistended; nontender; bowel sounds  present Extremities: No deformity; chronic appearing edema and skin changes of lower extremities:    Neurologic: Awake, alert and oriented; motor function intact in all extremities and symmetric; no facial droop Skin: Warm and dry Psychiatric: Normal mood and affect   RESULTS  Summary of this visit's results, reviewed and interpreted by myself:   EKG Interpretation  Date/Time:  Friday April 19 2020 02:09:29 EDT Ventricular Rate:  56 PR Interval:    QRS Duration: 98 QT Interval:  427 QTC Calculation: 413 R Axis:   84 Text Interpretation: Atrial fibrillation Borderline right axis deviation Confirmed by Brecklyn Galvis [phone]) on 04/19/2020 2:23:26 AM      Laboratory Studies: Results for orders placed or performed during the hospital encounter of 04/19/20 (from the past 24 hour(s))  SARS Coronavirus 2 by RT PCR (hospital order, performed in [facility] hospital lab) Nasopharyngeal Nasopharyngeal Swab     Status: None   Collection Time: 04/19/20  4:50 AM   Specimen: Nasopharyngeal Swab  Result Value Ref Range   SARS Coronavirus 2 NEGATIVE NEGATIVE  CBC with Differential/Platelet     Status: Abnormal   Collection Time: 04/19/20  4:50 AM  Result Value Ref Range   WBC 4.2 4.0 - 10.5 K/uL   RBC 2.18 (L) 4.22 - 5.81 MIL/uL   Hemoglobin 6.8 (LL) 13.0 - 17.0 g/dL   HCT 21.3 (L) 39 - 52 %   MCV 97.7 80.0 - 100.0 fL   MCH 31.2 26.0 - 34.0 pg   MCHC 31.9 30.0 - 36.0 g/dL   RDW 20.6 (H) 11.5 - 15.5 %   Platelets 96 (L) 150 - 400 K/uL   nRBC 0.0 0.0 - 0.2 %   Neutrophils Relative % 70 %   Neutro Abs 3.0 1.7 - 7.7 K/uL   Lymphocytes Relative 15 %   Lymphs Abs 0.6 (L) 0.7 - 4.0 K/uL   Monocytes Relative 11 %   Monocytes Absolute 0.5 0 - 1 K/uL   Eosinophils Relative 2 %   Eosinophils Absolute 0.1 0 - 0 K/uL   Basophils Relative 1 %   Basophils Absolute 0.0 0 - 0 K/uL   WBC Morphology MORPHOLOGY UNREMARKABLE    Smear Review PLATELET COUNT CONFIRMED BY SMEAR    Immature Granulocytes  1 %   Abs Immature Granulocytes 0.03 0.00 - 0.07 K/uL   Tear Drop Cells PRESENT    Ovalocytes PRESENT   Brain natriuretic peptide     Status: Abnormal   Collection Time: 04/19/20  4:50 AM  Result Value Ref Range   B Natriuretic Peptide 145.1 (H) 0.0 - 100.0 pg/mL  Troponin I (High Sensitivity)     Status: None   Collection Time: 04/19/20  4:50 AM  Result Value Ref Range   Troponin I (High Sensitivity) 10 <18 ng/L  Comprehensive metabolic panel     Status: Abnormal   Collection Time: 04/19/20  4:50 AM  Result Value Ref Range   Sodium 137 135 - 145 mmol/L   Potassium 5.2 (  H) 3.5 - 5.1 mmol/L   Chloride 105 98 - 111 mmol/L   CO2 21 (L) 22 - 32 mmol/L   Glucose, Bld 192 (H) 70 - 99 mg/dL   BUN 80 (H) 8 - 23 mg/dL   Creatinine, Ser 2.82 (H) 0.61 - 1.24 mg/dL   Calcium 8.2 (L) 8.9 - 10.3 mg/dL   Total Protein 6.6 6.5 - 8.1 g/dL   Albumin 3.5 3.5 - 5.0 g/dL   AST 18 15 - 41 U/L   ALT 15 0 - 44 U/L   Alkaline Phosphatase 115 38 - 126 U/L   Total Bilirubin 2.3 (H) 0.3 - 1.2 mg/dL   GFR calc non Af Amer 20 (L) >60 mL/min   GFR calc Af Amer 24 (L) >60 mL/min   Anion gap 11 5 - 15   Imaging Studies: DG Chest 2 View  Result Date: 04/19/2020 CLINICAL DATA:  Shortness of breath. Congestive heart failure. Progressive symptoms. EXAM: CHEST - 2 VIEW COMPARISON:  One-view chest x-ray 03/21/2020 FINDINGS: Heart is enlarged. Mild edema is present. Asymmetric right pleural effusion and lower lobe airspace disease is present. Visualized soft tissues and bony thorax are unremarkable. IMPRESSION: 1. Cardiomegaly and mild edema compatible with congestive heart failure. 2. Asymmetric right pleural effusion and lower lobe airspace disease. This has been a chronic recurrent finding. It is worse than on the prior study. Infection is not excluded. Electronically Signed   By: San Morelle M.D.   On: 04/19/2020 04:33    ED COURSE and MDM  Nursing notes, initial and subsequent vitals signs, including  pulse oximetry, reviewed and interpreted by myself.  Vitals:   04/19/20 0210 04/19/20 0400 04/19/20 0430  BP: (!) 141/49 (!) 143/56 (!) 146/52  Pulse: 57 62 59  Resp: 20 17 22   Temp: 97.8 F (36.6 C)    TempSrc: Oral    SpO2: 98% 98% 99%   Medications - No data to display  6:00 AM The patient's troponin and BNP are unremarkable so I doubt an acute exacerbation of CHF.  He has not been hypoxic.  His shortness of breath is likely due to a combination of his worsening chronic anemia as well as the pleural effusion.  The pleural effusion has been an ongoing problem although it is somewhat worse today.  As noted above he is scheduled for a blood transfusion upstairs.  I believe it would be in the patient's best interest to go ahead and transfer him to the clinic once they open for the blood which she has already had typed and crossed specifically for him.  He could be reassessed after transfusion and a determination made whether he needs admission for further evaluation or work-up.  Renal dysfunction appears stable.   PROCEDURES  Procedures  CRITICAL CARE Performed by: Karen Chafe Vadie Principato Total critical care time: 30 minutes Critical care time was exclusive of separately billable procedures and treating other patients. Critical care was necessary to treat or prevent imminent or life-threatening deterioration. Critical care was time spent personally by me on the following activities: development of treatment plan with patient and/or surrogate as well as nursing, discussions with consultants, evaluation of patient's response to treatment, examination of patient, obtaining history from patient or surrogate, ordering and performing treatments and interventions, ordering and review of laboratory studies, ordering and review of radiographic studies, pulse oximetry and re-evaluation of patient's condition.   ED DIAGNOSES     ICD-10-CM   1. Symptomatic anemia  D64.9   2. Bilateral  lower extremity edema   R60.0   3. Pleural effusion on right  J90   4. Shortness of breath  R06.02   5. Renal insufficiency  N28.9        Analysa Nutting, Jenny Reichmann, MD 04/19/20 [phone]

## 2020-04-19 NOTE — Progress Notes (Signed)
Pt here for blood products. Clarification requested on inject/iron infusion today.  Per Eustaquio Maize, Desk RN with Dr. Lorenso Courier.-on 7/9 he got retacrit and blood, 7/14 he got ferehem, 7/23 he got retacrit. today should be last fereheme and blood. Retacrit is every 2 weeks.

## 2020-04-19 NOTE — ED Triage Notes (Signed)
Pt reports weeping/swelling in left lower leg since last night, reports hx of CHF. Reports increasing shortness of breath, speaking in full sentences for several minutes without increased WOB/difficulty. Ambulatory at a steady gait with walker.

## 2020-04-19 NOTE — Discharge Instructions (Signed)
Proceed directly to the hematology/oncology clinic this morning for your transfusion appointment.

## 2020-04-19 NOTE — ED Notes (Signed)
To x-ray

## 2020-04-19 NOTE — ED Notes (Signed)
Return from xray

## 2020-04-19 NOTE — ED Notes (Signed)
MD with pt  

## 2020-04-19 NOTE — ED Notes (Signed)
Pt took home meds per md orders,  Pt then taken to 3 rd floor by wheelchair for blood infusion which was scheduled to 0830 this am  Iv intact and left in place per md orders

## 2020-04-19 NOTE — ED Notes (Signed)
Date and time results received: 04/19/20 5:14 AM  (use smartphrase ".now" to insert current time)  Test: Hemoglobin Critical Value: 6.8  Name of Provider Notified: Dr. Florina Ou  Orders Received? Or Actions Taken?: no new orders given

## 2020-04-19 NOTE — ED Notes (Addendum)
Pt. Requested water. MD approved and Water given.

## 2020-04-20 LAB — TYPE AND SCREEN
ABO/RH(D): O POS
Antibody Screen: NEGATIVE
Unit division: 0

## 2020-04-20 LAB — BPAM RBC
Blood Product Expiration Date: 202109012359
ISSUE DATE / TIME: 202107300752
Unit Type and Rh: 5100

## 2020-04-24 ENCOUNTER — Other Ambulatory Visit: Payer: Self-pay | Admitting: *Deleted

## 2020-04-24 DIAGNOSIS — D649 Anemia, unspecified: Secondary | ICD-10-CM

## 2020-04-25 ENCOUNTER — Other Ambulatory Visit: Payer: Self-pay

## 2020-04-25 ENCOUNTER — Other Ambulatory Visit: Payer: Self-pay | Admitting: *Deleted

## 2020-04-25 ENCOUNTER — Encounter: Payer: Self-pay | Admitting: *Deleted

## 2020-04-25 ENCOUNTER — Other Ambulatory Visit: Payer: Self-pay | Admitting: Hematology and Oncology

## 2020-04-25 ENCOUNTER — Inpatient Hospital Stay: Payer: Medicare Other | Attending: Hematology and Oncology

## 2020-04-25 DIAGNOSIS — D649 Anemia, unspecified: Secondary | ICD-10-CM

## 2020-04-25 DIAGNOSIS — Z79899 Other long term (current) drug therapy: Secondary | ICD-10-CM | POA: Insufficient documentation

## 2020-04-25 DIAGNOSIS — D696 Thrombocytopenia, unspecified: Secondary | ICD-10-CM | POA: Insufficient documentation

## 2020-04-25 DIAGNOSIS — N184 Chronic kidney disease, stage 4 (severe): Secondary | ICD-10-CM | POA: Diagnosis not present

## 2020-04-25 LAB — CMP (CANCER CENTER ONLY)
ALT: 12 U/L (ref 0–44)
AST: 15 U/L (ref 15–41)
Albumin: 3.9 g/dL (ref 3.5–5.0)
Alkaline Phosphatase: 106 U/L (ref 38–126)
Anion gap: 7 (ref 5–15)
BUN: 83 mg/dL — ABNORMAL HIGH (ref 8–23)
CO2: 23 mmol/L (ref 22–32)
Calcium: 9.3 mg/dL (ref 8.9–10.3)
Chloride: 108 mmol/L (ref 98–111)
Creatinine: 2.55 mg/dL — ABNORMAL HIGH (ref 0.61–1.24)
GFR, Est AFR Am: 27 mL/min — ABNORMAL LOW (ref >60)
GFR, Estimated: 23 mL/min — ABNORMAL LOW (ref >60)
Glucose, Bld: 144 mg/dL — ABNORMAL HIGH (ref 70–99)
Potassium: 5.6 mmol/L — ABNORMAL HIGH (ref 3.5–5.1)
Sodium: 138 mmol/L (ref 135–145)
Total Bilirubin: 2.2 mg/dL — ABNORMAL HIGH (ref 0.3–1.2)
Total Protein: 6.4 g/dL — ABNORMAL LOW (ref 6.5–8.1)

## 2020-04-25 LAB — CBC WITH DIFFERENTIAL (CANCER CENTER ONLY)
Abs Immature Granulocytes: 0.01 10*3/uL (ref 0.00–0.07)
Basophils Absolute: 0 10*3/uL (ref 0.0–0.1)
Basophils Relative: 1 %
Eosinophils Absolute: 0.1 10*3/uL (ref 0.0–0.5)
Eosinophils Relative: 2 %
HCT: 23.7 % — ABNORMAL LOW (ref 39.0–52.0)
Hemoglobin: 7.6 g/dL — ABNORMAL LOW (ref 13.0–17.0)
Immature Granulocytes: 0 %
Lymphocytes Relative: 16 %
Lymphs Abs: 0.8 10*3/uL (ref 0.7–4.0)
MCH: 31.9 pg (ref 26.0–34.0)
MCHC: 32.1 g/dL (ref 30.0–36.0)
MCV: 99.6 fL (ref 80.0–100.0)
Monocytes Absolute: 0.5 10*3/uL (ref 0.1–1.0)
Monocytes Relative: 11 %
Neutro Abs: 3.4 10*3/uL (ref 1.7–7.7)
Neutrophils Relative %: 70 %
Platelet Count: 76 10*3/uL — ABNORMAL LOW (ref 150–400)
RBC: 2.38 MIL/uL — ABNORMAL LOW (ref 4.22–5.81)
RDW: 20.2 % — ABNORMAL HIGH (ref 11.5–15.5)
WBC Count: 4.9 10*3/uL (ref 4.0–10.5)
nRBC: 0 % (ref 0.0–0.2)

## 2020-04-25 LAB — SAMPLE TO BLOOD BANK

## 2020-04-25 LAB — PREPARE RBC (CROSSMATCH)

## 2020-04-25 NOTE — Progress Notes (Signed)
Order received from Dr. Lorenso Courier for pt to get one unit of blood and Retacrit 40,000 units tomorrow, 04/26/20.

## 2020-04-26 ENCOUNTER — Inpatient Hospital Stay: Payer: Medicare Other

## 2020-04-26 ENCOUNTER — Other Ambulatory Visit: Payer: Medicare Other

## 2020-04-26 ENCOUNTER — Ambulatory Visit: Payer: Medicare Other

## 2020-04-26 ENCOUNTER — Other Ambulatory Visit: Payer: Self-pay | Admitting: Hematology & Oncology

## 2020-04-26 VITALS — BP 127/39 | HR 49 | Temp 98.0°F | Resp 16

## 2020-04-26 DIAGNOSIS — D649 Anemia, unspecified: Secondary | ICD-10-CM | POA: Diagnosis not present

## 2020-04-26 DIAGNOSIS — D696 Thrombocytopenia, unspecified: Secondary | ICD-10-CM

## 2020-04-26 MED ORDER — EPOETIN ALFA-EPBX 40000 UNIT/ML IJ SOLN
40000.0000 [IU] | Freq: Once | INTRAMUSCULAR | Status: AC
  Administered 2020-04-26: 40000 [IU] via SUBCUTANEOUS

## 2020-04-26 MED ORDER — EPOETIN ALFA-EPBX 40000 UNIT/ML IJ SOLN
INTRAMUSCULAR | Status: AC
  Filled 2020-04-26: qty 1

## 2020-04-26 NOTE — Patient Instructions (Signed)
https://www.redcrossblood.org/donate-blood/blood-donation-process/what-happens-to-donated-blood/blood-transfusions/types-of-blood-transfusions.html"> https://www.hematology.org/education/patients/blood-basics/blood-safety-and-matching"> https://www.nhlbi.nih.gov/health-topics/blood-transfusion">  Blood Transfusion, Adult A blood transfusion is a procedure in which you receive blood or a type of blood cell (blood component) through an IV. You may need a blood transfusion when your blood level is low. This may result from a bleeding disorder, illness, injury, or surgery. The blood may come from a donor. You may also be able to donate blood for yourself (autologous blood donation) before a planned surgery. The blood given in a transfusion is made up of different blood components. You may receive:  Red blood cells. These carry oxygen to the cells in the body.  Platelets. These help your blood to clot.  Plasma. This is the liquid part of your blood. It carries proteins and other substances throughout the body.  White blood cells. These help you fight infections. If you have hemophilia or another clotting disorder, you may also receive other types of blood products. Tell a health care provider about:  Any blood disorders you have.  Any previous reactions you have had during a blood transfusion.  Any allergies you have.  All medicines you are taking, including vitamins, herbs, eye drops, creams, and over-the-counter medicines.  Any surgeries you have had.  Any medical conditions you have, including any recent fever or cold symptoms.  Whether you are pregnant or may be pregnant. What are the risks? Generally, this is a safe procedure. However, problems may occur.  The most common problems include: ? A mild allergic reaction, such as red, swollen areas of skin (hives) and itching. ? Fever or chills. This may be the body's response to new blood cells received. This may occur during or up to 4  hours after the transfusion.  More serious problems may include: ? Transfusion-associated circulatory overload (TACO), or too much fluid in the lungs. This may cause breathing problems. ? A serious allergic reaction, such as difficulty breathing or swelling around the face and lips. ? Transfusion-related acute lung injury (TRALI), which causes breathing difficulty and low oxygen in the blood. This can occur within hours of the transfusion or several days later. ? Iron overload. This can happen after receiving many blood transfusions over a period of time. ? Infection or virus being transmitted. This is rare because donated blood is carefully tested before it is given. ? Hemolytic transfusion reaction. This is rare. It happens when your body's defense system (immune system)tries to attack the new blood cells. Symptoms may include fever, chills, nausea, low blood pressure, and low back or chest pain. ? Transfusion-associated graft-versus-host disease (TAGVHD). This is rare. It happens when donated cells attack your body's healthy tissues. What happens before the procedure? Medicines Ask your health care provider about:  Changing or stopping your regular medicines. This is especially important if you are taking diabetes medicines or blood thinners.  Taking medicines such as aspirin and ibuprofen. These medicines can thin your blood. Do not take these medicines unless your health care provider tells you to take them.  Taking over-the-counter medicines, vitamins, herbs, and supplements. General instructions  Follow instructions from your health care provider about eating and drinking restrictions.  You will have a blood test to determine your blood type. This is necessary to know what kind of blood your body will accept and to match it to the donor blood.  If you are going to have a planned surgery, you may be able to do an autologous blood donation. This may be done in case you need to have a  transfusion.    You will have your temperature, blood pressure, and pulse monitored before the transfusion.  If you have had an allergic reaction to a transfusion in the past, you may be given medicine to help prevent a reaction. This medicine may be given to you by mouth (orally) or through an IV.  Set aside time for the blood transfusion. This procedure generally takes 1-4 hours to complete. What happens during the procedure?   An IV will be inserted into one of your veins.  The bag of donated blood will be attached to your IV. The blood will then enter through your vein.  Your temperature, blood pressure, and pulse will be monitored regularly during the transfusion. This monitoring is done to detect early signs of a transfusion reaction.  Tell your nurse right away if you have any of these symptoms during the transfusion: ? Shortness of breath or trouble breathing. ? Chest or back pain. ? Fever or chills. ? Hives or itching.  If you have any signs or symptoms of a reaction, your transfusion will be stopped and you may be given medicine.  When the transfusion is complete, your IV will be removed.  Pressure may be applied to the IV site for a few minutes.  A bandage (dressing)will be applied. The procedure may vary among health care providers and hospitals. What happens after the procedure?  Your temperature, blood pressure, pulse, breathing rate, and blood oxygen level will be monitored until you leave the hospital or clinic.  Your blood may be tested to see how you are responding to the transfusion.  You may be warmed with fluids or blankets to maintain a normal body temperature.  If you receive your blood transfusion in an outpatient setting, you will be told whom to contact to report any reactions. Where to find more information For more information on blood transfusions, visit the American Red Cross: redcross.org Summary  A blood transfusion is a procedure in which you  receive blood or a type of blood cell (blood component) through an IV.  The blood you receive may come from a donor or be donated by yourself (autologous blood donation) before a planned surgery.  The blood given in a transfusion is made up of different blood components. You may receive red blood cells, platelets, plasma, or white blood cells depending on the condition treated.  Your temperature, blood pressure, and pulse will be monitored before, during, and after the transfusion.  After the transfusion, your blood may be tested to see how your body has responded. This information is not intended to replace advice given to you by your health care provider. Make sure you discuss any questions you have with your health care provider. Document Revised: 03/02/2019 Document Reviewed: 03/02/2019 Elsevier Patient Education  [place]. Epoetin Alfa injection What is this medicine? EPOETIN ALFA (e POE e tin AL fa) helps your body make more red blood cells. This medicine is used to treat anemia caused by chronic kidney disease, cancer chemotherapy, or HIV-therapy. It may also be used before surgery if you have anemia. This medicine may be used for other purposes; ask your health care provider or pharmacist if you have questions. COMMON BRAND NAME(S): Epogen, Procrit, Retacrit What should I tell my health care provider before I take this medicine? They need to know if you have any of these conditions:  cancer  heart disease  high blood pressure  history of blood clots  history of stroke  low levels of folate, iron, or  vitamin B12 in the blood  seizures  an unusual or allergic reaction to erythropoietin, albumin, benzyl alcohol, hamster proteins, other medicines, foods, dyes, or preservatives  pregnant or trying to get pregnant  breast-feeding How should I use this medicine? This medicine is for injection into a vein or under the skin. It is usually given by a health care  professional in a hospital or clinic setting. If you get this medicine at home, you will be taught how to prepare and give this medicine. Use exactly as directed. Take your medicine at regular intervals. Do not take your medicine more often than directed. It is important that you put your used needles and syringes in a special sharps container. Do not put them in a trash can. If you do not have a sharps container, call your pharmacist or healthcare provider to get one. A special MedGuide will be given to you by the pharmacist with each prescription and refill. Be sure to read this information carefully each time. Talk to your pediatrician regarding the use of this medicine in children. While this drug may be prescribed for selected conditions, precautions do apply. Overdosage: If you think you have taken too much of this medicine contact a poison control center or emergency room at once. NOTE: This medicine is only for you. Do not share this medicine with others. What if I miss a dose? If you miss a dose, take it as soon as you can. If it is almost time for your next dose, take only that dose. Do not take double or extra doses. What may interact with this medicine? Interactions have not been studied. This list may not describe all possible interactions. Give your health care provider a list of all the medicines, herbs, non-prescription drugs, or dietary supplements you use. Also tell them if you smoke, drink alcohol, or use illegal drugs. Some items may interact with your medicine. What should I watch for while using this medicine? Your condition will be monitored carefully while you are receiving this medicine. You may need blood work done while you are taking this medicine. This medicine may cause a decrease in vitamin B6. You should make sure that you get enough vitamin B6 while you are taking this medicine. Discuss the foods you eat and the vitamins you take with your health care  professional. What side effects may I notice from receiving this medicine? Side effects that you should report to your doctor or health care professional as soon as possible:  allergic reactions like skin rash, itching or hives, swelling of the face, lips, or tongue  seizures  signs and symptoms of a blood clot such as breathing problems; changes in vision; chest pain; severe, sudden headache; pain, swelling, warmth in the leg; trouble speaking; sudden numbness or weakness of the face, arm or leg  signs and symptoms of a stroke like changes in vision; confusion; trouble speaking or understanding; severe headaches; sudden numbness or weakness of the face, arm or leg; trouble walking; dizziness; loss of balance or coordination Side effects that usually do not require medical attention (report to your doctor or health care professional if they continue or are bothersome):  chills  cough  dizziness  fever  headaches  joint pain  muscle cramps  muscle pain  nausea, vomiting  pain, redness, or irritation at site where injected This list may not describe all possible side effects. Call your doctor for medical advice about side effects. You may report side effects to FDA at  1-800-FDA-1088. Where should I keep my medicine? Keep out of the reach of children. Store in a refrigerator between 2 and 8 degrees C (36 and 46 degrees F). Do not freeze or shake. Throw away any unused portion if using a single-dose vial. Multi-dose vials can be kept in the refrigerator for up to 21 days after the initial dose. Throw away unused medicine. NOTE: This sheet is a summary. It may not cover all possible information. If you have questions about this medicine, talk to your doctor, pharmacist, or health care provider.  2020 Elsevier/Gold Standard (2017-04-16 08:35:19)

## 2020-04-28 ENCOUNTER — Other Ambulatory Visit: Payer: Self-pay | Admitting: Hematology and Oncology

## 2020-04-28 DIAGNOSIS — D649 Anemia, unspecified: Secondary | ICD-10-CM

## 2020-04-29 ENCOUNTER — Inpatient Hospital Stay (HOSPITAL_BASED_OUTPATIENT_CLINIC_OR_DEPARTMENT_OTHER): Payer: Medicare Other | Admitting: Hematology and Oncology

## 2020-04-29 ENCOUNTER — Other Ambulatory Visit: Payer: Medicare Other

## 2020-04-29 ENCOUNTER — Other Ambulatory Visit: Payer: Self-pay

## 2020-04-29 ENCOUNTER — Inpatient Hospital Stay: Payer: Medicare Other

## 2020-04-29 VITALS — BP 124/46 | HR 54 | Temp 97.3°F | Resp 18 | Ht 72.0 in | Wt 266.5 lb

## 2020-04-29 DIAGNOSIS — D649 Anemia, unspecified: Secondary | ICD-10-CM

## 2020-04-29 DIAGNOSIS — D696 Thrombocytopenia, unspecified: Secondary | ICD-10-CM | POA: Diagnosis not present

## 2020-04-29 LAB — TYPE AND SCREEN
ABO/RH(D): O POS
Antibody Screen: NEGATIVE
Unit division: 0

## 2020-04-29 LAB — CMP (CANCER CENTER ONLY)
ALT: 12 U/L (ref 0–44)
AST: 17 U/L (ref 15–41)
Albumin: 3.6 g/dL (ref 3.5–5.0)
Alkaline Phosphatase: 127 U/L — ABNORMAL HIGH (ref 38–126)
Anion gap: 8 (ref 5–15)
BUN: 71 mg/dL — ABNORMAL HIGH (ref 8–23)
CO2: 21 mmol/L — ABNORMAL LOW (ref 22–32)
Calcium: 9.3 mg/dL (ref 8.9–10.3)
Chloride: 110 mmol/L (ref 98–111)
Creatinine: 2.69 mg/dL — ABNORMAL HIGH (ref 0.61–1.24)
GFR, Est AFR Am: 25 mL/min — ABNORMAL LOW (ref >60)
GFR, Estimated: 22 mL/min — ABNORMAL LOW (ref >60)
Glucose, Bld: 75 mg/dL (ref 70–99)
Potassium: 4.9 mmol/L (ref 3.5–5.1)
Sodium: 139 mmol/L (ref 135–145)
Total Bilirubin: 3.4 mg/dL — ABNORMAL HIGH (ref 0.3–1.2)
Total Protein: 6.7 g/dL (ref 6.5–8.1)

## 2020-04-29 LAB — FERRITIN: Ferritin: 240 ng/mL (ref 24–336)

## 2020-04-29 LAB — LACTATE DEHYDROGENASE: LDH: 348 U/L — ABNORMAL HIGH (ref 98–192)

## 2020-04-29 LAB — RETIC PANEL
Immature Retic Fract: 15.7 % (ref 2.3–15.9)
RBC.: 2.61 MIL/uL — ABNORMAL LOW (ref 4.22–5.81)
Retic Count, Absolute: 151.4 10*3/uL (ref 19.0–186.0)
Retic Ct Pct: 5.8 % — ABNORMAL HIGH (ref 0.4–3.1)
Reticulocyte Hemoglobin: 36.8 pg (ref >27.9)

## 2020-04-29 LAB — IRON AND TIBC
Iron: 66 ug/dL (ref 42–163)
Saturation Ratios: 20 % (ref 20–55)
TIBC: 325 ug/dL (ref 202–409)
UIBC: 260 ug/dL (ref 117–376)

## 2020-04-29 LAB — CBC WITH DIFFERENTIAL (CANCER CENTER ONLY)
Abs Immature Granulocytes: 0.01 10*3/uL (ref 0.00–0.07)
Basophils Absolute: 0 10*3/uL (ref 0.0–0.1)
Basophils Relative: 1 %
Eosinophils Absolute: 0.1 10*3/uL (ref 0.0–0.5)
Eosinophils Relative: 3 %
HCT: 25.1 % — ABNORMAL LOW (ref 39.0–52.0)
Hemoglobin: 8 g/dL — ABNORMAL LOW (ref 13.0–17.0)
Immature Granulocytes: 0 %
Lymphocytes Relative: 15 %
Lymphs Abs: 0.7 10*3/uL (ref 0.7–4.0)
MCH: 31 pg (ref 26.0–34.0)
MCHC: 31.9 g/dL (ref 30.0–36.0)
MCV: 97.3 fL (ref 80.0–100.0)
Monocytes Absolute: 0.5 10*3/uL (ref 0.1–1.0)
Monocytes Relative: 11 %
Neutro Abs: 3.3 10*3/uL (ref 1.7–7.7)
Neutrophils Relative %: 70 %
Platelet Count: 75 10*3/uL — ABNORMAL LOW (ref 150–400)
RBC: 2.58 MIL/uL — ABNORMAL LOW (ref 4.22–5.81)
RDW: 19.9 % — ABNORMAL HIGH (ref 11.5–15.5)
WBC Count: 4.7 10*3/uL (ref 4.0–10.5)
nRBC: 0 % (ref 0.0–0.2)

## 2020-04-29 LAB — BPAM RBC
Blood Product Expiration Date: 202109042359
ISSUE DATE / TIME: 202108060803
Unit Type and Rh: 5100

## 2020-04-29 NOTE — Progress Notes (Signed)
[facility] Telephone:([phone]) [phone]   Fax:([phone]) [phone]  PROGRESS NOTE  Patient Care Team: Forrestine Him, Hershal Coria as PCP - General (Family Medicine)  Hematological/Oncological History # Thrombocytopenia # Normocytic Anemia 1) 02/12/2020: WBC 5.9, Hgb 9.9, MCV 91, Plt 88 2) 02/19/2020: WBC 4.4, Hgb 7.3, MCV 89.9, Plt 102 3) 03/08/2020: WBC 6.0, Hgb 7.0, Plt 141, MCV 96.2 4) 03/20/2020: establish care with Dr. Lorenso Courier. Recommend started q 2 week Retacrit at 20,000 units.   5) 7/1-04/26/2020: patient received 7 units of PRBC 6) 04/26/2020: increased Retacrit to 40,000 units.  7) 04/29/2020: WBC 4.7, Hgb 8.0. MCV 97.3, Plt 75  Interval History:  Eric [name] 79 y.o. male with medical history significant for normocytic anemia and thrombocytopenia who presents for a follow up visit. The patient's last visit was on 03/20/2020 at which time he established care. In the interim since the last visit he has been receiving frequent blood transfusions in addition to iron infusion/EPO shots at [facility].   On exam today Eric [name] notes that he has continued to have leg swelling and shortness of breath since our last visit.  He unfortunately had to go to the emergency department on 04/19/2020 as result of the symptoms.  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  He notes otherwise that his appetite has been good and his weight has been increasing, though there may be some contribution of fluid that this.  He is gone from 2 and 54 pounds up to 266.  He currently denies having any issues with fevers, chills, sweats, nausea, vomiting or diarrhea.  He is not having any other overt signs of bleeding or bruising.  A full 10 point ROS is listed below.  MEDICAL HISTORY:  Past Medical History:  Diagnosis Date  . Anemia   . Bilateral lower extremity edema   . Chronic atrial fibrillation ([place])   . Diastolic heart failure ([place])   . Gall bladder disease 2020  . History  of right hip replacement    Pt stated 8-9 yrs ago    SURGICAL HISTORY: Past Surgical History:  Procedure Laterality Date  . APPENDECTOMY    . CHOLECYSTECTOMY    . TOTAL HIP ARTHROPLASTY      SOCIAL HISTORY: Social History   Socioeconomic History  . Marital status: Married    Spouse name: Not on file  . Number of children: Not on file  . Years of education: Not on file  . Highest education level: Not on file  Occupational History  . Not on file  Tobacco Use  . Smoking status: Never Smoker  . Smokeless tobacco: Never Used  Substance and Sexual Activity  . Alcohol use: Never  . Drug use: Never  . Sexual activity: Yes    Partners: Female  Other Topics Concern  . Not on file  Social History Narrative  . Not on file   Social Determinants of Health   Financial Resource Strain:   . Difficulty of Paying Living Expenses:   Food Insecurity:   . Worried About Charity fundraiser in the Last Year:   . Arboriculturist in the Last Year:   Transportation Needs:   . Film/video editor (Medical):   Marland Kitchen Lack of Transportation (Non-Medical):   Physical Activity:   . Days of Exercise per Week:   . Minutes of Exercise per Session:   Stress:   . Feeling of Stress :   Social Connections:   .  Frequency of Communication with Friends and Family:   . Frequency of Social Gatherings with Friends and Family:   . Attends Religious Services:   . Active Member of Clubs or Organizations:   . Attends Archivist Meetings:   Marland Kitchen Marital Status:   Intimate Partner Violence:   . Fear of Current or Ex-Partner:   . Emotionally Abused:   Marland Kitchen Physically Abused:   . Sexually Abused:     FAMILY HISTORY: No family history on file.  ALLERGIES:  is allergic to hydrocodone, iohexol, morphine and related, sulfa antibiotics, sulfonamide derivatives, and trazodone and nefazodone.  MEDICATIONS:  Current Outpatient Medications  Medication Sig Dispense Refill  . acetaminophen (TYLENOL) 325  MG tablet Take 650 mg by mouth every 6 (six) hours as needed for moderate pain.     Marland Kitchen allopurinol (ZYLOPRIM) 300 MG tablet Take 300 mg by mouth daily.    Marland Kitchen augmented betamethasone dipropionate (DIPROLENE-AF) 0.05 % cream Apply topically 2 (two) times daily as needed (rash).     Marland Kitchen diltiazem (TIAZAC) 360 MG 24 hr capsule Take 360 mg by mouth daily.    Marland Kitchen ELIQUIS 5 MG TABS tablet Take 5 mg by mouth 2 (two) times daily. Taking once daily    . insulin glargine, 1 Unit Dial, (TOUJEO SOLOSTAR) 300 UNIT/ML Solostar Pen Inject 55 Units into the skin daily.     . insulin lispro (HUMALOG) 100 UNIT/ML KwikPen Inject 19 Units into the skin 3 (three) times daily.     Marland Kitchen levothyroxine (SYNTHROID) 100 MCG tablet Take 50-100 mcg by mouth See admin instructions. Takes 100 mg daily except Sundays, on Sunday takes 50 mg    . silodosin (RAPAFLO) 8 MG CAPS capsule Take 8 mg by mouth daily.     . simvastatin (ZOCOR) 20 MG tablet Take 20 mg by mouth at bedtime.    Marland Kitchen spironolactone (ALDACTONE) 50 MG tablet Take 50 mg by mouth daily.    Marland Kitchen torsemide (DEMADEX) 20 MG tablet Take 20 mg by mouth daily.     No current facility-administered medications for this visit.    REVIEW OF SYSTEMS:   Constitutional: ( - ) fevers, ( - )  chills , ( - ) night sweats Eyes: ( - ) blurriness of vision, ( - ) double vision, ( - ) watery eyes Ears, nose, mouth, throat, and face: ( - ) mucositis, ( - ) sore throat Respiratory: ( - ) cough, ( - ) dyspnea, ( - ) wheezes Cardiovascular: ( - ) palpitation, ( - ) chest discomfort, ( - ) lower extremity swelling Gastrointestinal:  ( - ) nausea, ( - ) heartburn, ( - ) change in bowel habits Skin: ( - ) abnormal skin rashes Lymphatics: ( - ) new lymphadenopathy, ( - ) easy bruising Neurological: ( - ) numbness, ( - ) tingling, ( - ) new weaknesses Behavioral/Psych: ( - ) mood change, ( - ) new changes  All other systems were reviewed with the patient and are negative.  PHYSICAL EXAMINATION: ECOG  PERFORMANCE STATUS: 2 - Symptomatic, <50% confined to bed  Vitals:   04/29/20 1012  BP: (!) 124/46  Pulse: (!) 54  Resp: 18  Temp: (!) 97.3 F (36.3 C)  SpO2: 100%   Filed Weights   04/29/20 1012  Weight: 266 lb 8 oz (120.9 kg)    GENERAL: well appearing elderly caucasian male in  NAD. alert, no distress and comfortable SKIN: skin color, texture, turgor are normal, no rashes or significant  lesions EYES: conjunctiva are pink and non-injected, sclera clear LUNGS: clear to auscultation and percussion with normal breathing effort HEART: regular rate & rhythm and no murmurs. +3 bilateral lower extremity edema ABDOMEN: soft, non-tender, non-distended, normal bowel sounds Musculoskeletal: no cyanosis of digits and no clubbing  PSYCH: alert & oriented x 3, fluent speech NEURO: no focal motor/sensory deficits  LABORATORY DATA:  I have reviewed the data as listed CBC Latest Ref Rng & Units 04/29/2020 04/25/2020 04/19/2020  WBC 4.0 - 10.5 K/uL 4.7 4.9 4.2  Hemoglobin 13.0 - 17.0 g/dL 8.0(L) 7.6(L) 6.8(LL)  Hematocrit 39 - 52 % 25.1(L) 23.7(L) 21.3(L)  Platelets 150 - 400 K/uL 75(L) 76(L) 96(L)    CMP Latest Ref Rng & Units 04/29/2020 04/25/2020 04/19/2020  Glucose 70 - 99 mg/dL 75 144(H) 192(H)  BUN 8 - 23 mg/dL 71(H) 83(H) 80(H)  Creatinine 0.61 - 1.24 mg/dL 2.69(H) 2.55(H) 2.82(H)  Sodium 135 - 145 mmol/L 139 138 137  Potassium 3.5 - 5.1 mmol/L 4.9 5.6(H) 5.2(H)  Chloride 98 - 111 mmol/L 110 108 105  CO2 22 - 32 mmol/L 21(L) 23 21(L)  Calcium 8.9 - 10.3 mg/dL 9.3 9.3 8.2(L)  Total Protein 6.5 - 8.1 g/dL 6.7 6.4(L) 6.6  Total Bilirubin 0.3 - 1.2 mg/dL 3.4(H) 2.2(H) 2.3(H)  Alkaline Phos 38 - 126 U/L 127(H) 106 115  AST 15 - 41 U/L _0 ALT 0 - 44 U/L _1 No results found for: MPROTEIN Lab Results  Component Value Date   KPAFRELGTCHN 49.7 (H) 02/17/2020   LAMBDASER 40.1 (H) 02/17/2020   KAPLAMBRATIO 1.24 02/17/2020    RADIOGRAPHIC STUDIES: DG Chest 2 View  Result  Date: 04/19/2020 CLINICAL DATA:  Shortness of breath. Congestive heart failure. Progressive symptoms. EXAM: CHEST - 2 VIEW COMPARISON:  One-view chest x-ray 03/21/2020 FINDINGS: Heart is enlarged. Mild edema is present. Asymmetric right pleural effusion and lower lobe airspace disease is present. Visualized soft tissues and bony thorax are unremarkable. IMPRESSION: 1. Cardiomegaly and mild edema compatible with congestive heart failure. 2. Asymmetric right pleural effusion and lower lobe airspace disease. This has been a chronic recurrent finding. It is worse than on the prior study. Infection is not excluded. Electronically Signed   By: San Morelle M.D.   On: 04/19/2020 04:33    ASSESSMENT & PLAN Eric [name] 79 y.o. male with medical history significant for normocytic anemia and thrombocytopenia who presents for a follow up visit.  After review of the labs, discussion with the patient, and reviewed the prior records his findings are consistent with refractory normocytic anemia and thrombocytopenia.  During her prior visit it appeared that his thrombocytopenia was trending upward, however at this time it looks to be trending down again.  Additionally his hemoglobin has been difficult to keep above the baseline of 7.0, though there appears to be some improvement with the increase in the Epogen to 40,000 units.  Although the most likely etiology at this time still remains the patient's poor kidney function and low endogenous erythropoietin I would recommend we do a further evaluation particular in the setting of the thrombocytopenia are failing to rebound to normal.  We will perform a bone marrow biopsy and consider additional work-up based off the findings from that study.   #Refractory Normocytic Anemia #Persistent Thrombocytopenia --given the modest response to EPO and the worsening of the patient's platelet count I am concerned there may be a primary bone marrow disorder in addition to his  kidney dyfunction. To effectively r/o bone marrow disorder we have ordered a bone marrow biopsy. --dose of erythropoietin increased to 40,000 q 2 weeks. Hgb 8.0 today --kidney function is currently at baseline.  --iron stores appear replete, will hold on these iron infusions for now --patient has requested transfer to [facility] Will discuss transfer of care with my colleague at that site.   #CKD Stage IV --currently under the care of a local nephrologist --defer to nephrology for diuretic management   No orders of the defined types were placed in this encounter.   All questions were answered. The patient knows to call the clinic with any problems, questions or concerns.  A total of more than 30 minutes were spent on this encounter and over half of that time was spent on counseling and coordination of care as outlined above.   Eric Peoples, MD Department of Hematology/Oncology [place] [facility] Phone: [phone] Pager: [phone] Email: [email or web address]  04/30/2020 2:07 PM

## 2020-04-30 ENCOUNTER — Other Ambulatory Visit: Payer: Self-pay | Admitting: Hematology & Oncology

## 2020-04-30 DIAGNOSIS — D649 Anemia, unspecified: Secondary | ICD-10-CM | POA: Diagnosis not present

## 2020-05-01 ENCOUNTER — Other Ambulatory Visit: Payer: Self-pay | Admitting: *Deleted

## 2020-05-01 ENCOUNTER — Telehealth: Payer: Self-pay | Admitting: Hematology and Oncology

## 2020-05-01 ENCOUNTER — Telehealth: Payer: Self-pay | Admitting: *Deleted

## 2020-05-01 ENCOUNTER — Encounter (HOSPITAL_COMMUNITY): Payer: Self-pay | Admitting: Radiology

## 2020-05-01 DIAGNOSIS — D649 Anemia, unspecified: Secondary | ICD-10-CM

## 2020-05-01 LAB — BETA 2 MICROGLOBULIN, SERUM: Beta-2 Microglobulin: 12.8 mg/L — ABNORMAL HIGH (ref 0.6–2.4)

## 2020-05-01 NOTE — Telephone Encounter (Signed)
Scheduled per 8/9 los. Pt is aware of appt time and date. 

## 2020-05-01 NOTE — Progress Notes (Signed)
[name] Male, 79 y.o., 12-06-1940 MRN:  [id number] Phone:  [phone] [name]) PCP:  Forrestine Him, PA-C Coverage:  [employer] Next Appt With Oncology 05/13/2020 at 10:30 AM  RE: CT Bone Marrow Biopsy Received: Today Orson Slick, MD  Garth Bigness D OK to hold prior to procedure.       Previous Messages   ----- Message -----  From: Garth Bigness D  Sent: 04/30/2020  3:49 PM EDT  To: Orson Slick, MD  Subject: CT Bone Marrow Biopsy               Patient is on Eliquis and will need to hold for 2 days prior to Biopsy. Please advise if it is okay to hold prior to biopsy.   Thanks Aniceto Boss

## 2020-05-01 NOTE — Telephone Encounter (Signed)
ceived call from patient inquiring about his upcoming appts @ the [facility] office for labs, blood transfusions and epo injections.   Spoke with Dr. Lorenso Courier and clarified the patient's appt needs. Message sent to [facility] office to have these scheduled: Labs every Thursday and transfusions on Fridays Weekly labs and weekly transfusion appts. Will need this week-05/03/20 Injection appts every 2 weeks-next is due 05/10/20  Pt has CT guided bone marrow biopsy scheduled for 05/10/20. Appts for labs and transfusion will need to be adjusted for that week.

## 2020-05-02 ENCOUNTER — Inpatient Hospital Stay: Payer: Medicare Other

## 2020-05-02 ENCOUNTER — Other Ambulatory Visit: Payer: Self-pay

## 2020-05-02 ENCOUNTER — Other Ambulatory Visit: Payer: Self-pay | Admitting: Family

## 2020-05-02 DIAGNOSIS — D649 Anemia, unspecified: Secondary | ICD-10-CM | POA: Diagnosis not present

## 2020-05-02 LAB — MULTIPLE MYELOMA PANEL, SERUM
Albumin SerPl Elph-Mcnc: 3.6 g/dL (ref 2.9–4.4)
Albumin/Glob SerPl: 1.3 (ref 0.7–1.7)
Alpha 1: 0.3 g/dL (ref 0.0–0.4)
Alpha2 Glob SerPl Elph-Mcnc: 0.5 g/dL (ref 0.4–1.0)
B-Globulin SerPl Elph-Mcnc: 0.8 g/dL (ref 0.7–1.3)
Gamma Glob SerPl Elph-Mcnc: 1.4 g/dL (ref 0.4–1.8)
Globulin, Total: 2.9 g/dL (ref 2.2–3.9)
IgA: 231 mg/dL (ref 61–437)
IgG (Immunoglobin G), Serum: 1267 mg/dL (ref 603–1613)
IgM (Immunoglobulin M), Srm: 36 mg/dL (ref 15–143)
M Protein SerPl Elph-Mcnc: 0.5 g/dL — ABNORMAL HIGH
Total Protein ELP: 6.5 g/dL (ref 6.0–8.5)

## 2020-05-02 LAB — CBC WITH DIFFERENTIAL (CANCER CENTER ONLY)
Abs Immature Granulocytes: 0.02 10*3/uL (ref 0.00–0.07)
Basophils Absolute: 0 10*3/uL (ref 0.0–0.1)
Basophils Relative: 1 %
Eosinophils Absolute: 0.2 10*3/uL (ref 0.0–0.5)
Eosinophils Relative: 3 %
HCT: 24.9 % — ABNORMAL LOW (ref 39.0–52.0)
Hemoglobin: 7.9 g/dL — ABNORMAL LOW (ref 13.0–17.0)
Immature Granulocytes: 0 %
Lymphocytes Relative: 16 %
Lymphs Abs: 0.8 10*3/uL (ref 0.7–4.0)
MCH: 31.2 pg (ref 26.0–34.0)
MCHC: 31.7 g/dL (ref 30.0–36.0)
MCV: 98.4 fL (ref 80.0–100.0)
Monocytes Absolute: 0.5 10*3/uL (ref 0.1–1.0)
Monocytes Relative: 9 %
Neutro Abs: 3.5 10*3/uL (ref 1.7–7.7)
Neutrophils Relative %: 71 %
Platelet Count: 73 10*3/uL — ABNORMAL LOW (ref 150–400)
RBC: 2.53 MIL/uL — ABNORMAL LOW (ref 4.22–5.81)
RDW: 19.8 % — ABNORMAL HIGH (ref 11.5–15.5)
WBC Count: 5 10*3/uL (ref 4.0–10.5)
nRBC: 0 % (ref 0.0–0.2)

## 2020-05-02 LAB — UPEP/UIFE/LIGHT CHAINS/TP, 24-HR UR
% BETA, Urine: 14.4 %
ALPHA 1 URINE: 4.3 %
Albumin, U: 63.8 %
Alpha 2, Urine: 7 %
Free Kappa Lt Chains,Ur: 71.08 mg/L (ref 0.63–113.79)
Free Kappa/Lambda Ratio: 6.9 (ref 1.03–31.76)
Free Lambda Lt Chains,Ur: 10.3 mg/L (ref 0.47–11.77)
GAMMA GLOBULIN URINE: 10.6 %
Total Protein, Urine-Ur/day: 259 mg/24 hr — ABNORMAL HIGH (ref 30–150)
Total Protein, Urine: 16.2 mg/dL
Total Volume: 1600

## 2020-05-02 LAB — CMP (CANCER CENTER ONLY)
ALT: 10 U/L (ref 0–44)
AST: 14 U/L — ABNORMAL LOW (ref 15–41)
Albumin: 4 g/dL (ref 3.5–5.0)
Alkaline Phosphatase: 109 U/L (ref 38–126)
Anion gap: 8 (ref 5–15)
BUN: 83 mg/dL — ABNORMAL HIGH (ref 8–23)
CO2: 23 mmol/L (ref 22–32)
Calcium: 9.5 mg/dL (ref 8.9–10.3)
Chloride: 108 mmol/L (ref 98–111)
Creatinine: 2.77 mg/dL — ABNORMAL HIGH (ref 0.61–1.24)
GFR, Est AFR Am: 24 mL/min — ABNORMAL LOW (ref >60)
GFR, Estimated: 21 mL/min — ABNORMAL LOW (ref >60)
Glucose, Bld: 97 mg/dL (ref 70–99)
Potassium: 5.1 mmol/L (ref 3.5–5.1)
Sodium: 139 mmol/L (ref 135–145)
Total Bilirubin: 2.6 mg/dL — ABNORMAL HIGH (ref 0.3–1.2)
Total Protein: 6.4 g/dL — ABNORMAL LOW (ref 6.5–8.1)

## 2020-05-03 ENCOUNTER — Inpatient Hospital Stay: Payer: Medicare Other

## 2020-05-03 DIAGNOSIS — D649 Anemia, unspecified: Secondary | ICD-10-CM

## 2020-05-03 LAB — PREPARE RBC (CROSSMATCH)

## 2020-05-03 LAB — SAMPLE TO BLOOD BANK

## 2020-05-03 MED ORDER — DIPHENHYDRAMINE HCL 25 MG PO CAPS
ORAL_CAPSULE | ORAL | Status: AC
  Filled 2020-05-03: qty 1

## 2020-05-03 MED ORDER — SODIUM CHLORIDE 0.9% IV SOLUTION
250.0000 mL | Freq: Once | INTRAVENOUS | Status: AC
  Administered 2020-05-03: 250 mL via INTRAVENOUS
  Filled 2020-05-03: qty 250

## 2020-05-03 MED ORDER — ACETAMINOPHEN 325 MG PO TABS
ORAL_TABLET | ORAL | Status: AC
  Filled 2020-05-03: qty 2

## 2020-05-03 MED ORDER — DIPHENHYDRAMINE HCL 25 MG PO CAPS
25.0000 mg | ORAL_CAPSULE | Freq: Once | ORAL | Status: AC
  Administered 2020-05-03: 25 mg via ORAL

## 2020-05-03 MED ORDER — ACETAMINOPHEN 325 MG PO TABS
650.0000 mg | ORAL_TABLET | Freq: Once | ORAL | Status: AC
  Administered 2020-05-03: 650 mg via ORAL

## 2020-05-03 NOTE — Patient Instructions (Signed)

## 2020-05-04 LAB — TYPE AND SCREEN
ABO/RH(D): O POS
Antibody Screen: NEGATIVE
Unit division: 0

## 2020-05-04 LAB — BPAM RBC
Blood Product Expiration Date: 202109092359
ISSUE DATE / TIME: 202108130758
Unit Type and Rh: 5100

## 2020-05-06 ENCOUNTER — Encounter: Payer: Medicare Other | Admitting: Surgery

## 2020-05-06 ENCOUNTER — Encounter (HOSPITAL_COMMUNITY): Payer: Medicare Other

## 2020-05-08 ENCOUNTER — Inpatient Hospital Stay: Payer: Medicare Other

## 2020-05-08 ENCOUNTER — Other Ambulatory Visit: Payer: Self-pay | Admitting: Hematology & Oncology

## 2020-05-08 ENCOUNTER — Other Ambulatory Visit: Payer: Self-pay

## 2020-05-08 DIAGNOSIS — D649 Anemia, unspecified: Secondary | ICD-10-CM | POA: Diagnosis not present

## 2020-05-08 LAB — CBC WITH DIFFERENTIAL (CANCER CENTER ONLY)
Abs Immature Granulocytes: 0.01 10*3/uL (ref 0.00–0.07)
Basophils Absolute: 0 10*3/uL (ref 0.0–0.1)
Basophils Relative: 1 %
Eosinophils Absolute: 0.1 10*3/uL (ref 0.0–0.5)
Eosinophils Relative: 4 %
HCT: 24.9 % — ABNORMAL LOW (ref 39.0–52.0)
Hemoglobin: 8.1 g/dL — ABNORMAL LOW (ref 13.0–17.0)
Immature Granulocytes: 0 %
Lymphocytes Relative: 16 %
Lymphs Abs: 0.6 10*3/uL — ABNORMAL LOW (ref 0.7–4.0)
MCH: 31.5 pg (ref 26.0–34.0)
MCHC: 32.5 g/dL (ref 30.0–36.0)
MCV: 96.9 fL (ref 80.0–100.0)
Monocytes Absolute: 0.5 10*3/uL (ref 0.1–1.0)
Monocytes Relative: 14 %
Neutro Abs: 2.3 10*3/uL (ref 1.7–7.7)
Neutrophils Relative %: 65 %
Platelet Count: 69 10*3/uL — ABNORMAL LOW (ref 150–400)
RBC: 2.57 MIL/uL — ABNORMAL LOW (ref 4.22–5.81)
RDW: 18.2 % — ABNORMAL HIGH (ref 11.5–15.5)
WBC Count: 3.5 10*3/uL — ABNORMAL LOW (ref 4.0–10.5)
nRBC: 0 % (ref 0.0–0.2)

## 2020-05-08 LAB — SAMPLE TO BLOOD BANK

## 2020-05-09 ENCOUNTER — Inpatient Hospital Stay (HOSPITAL_BASED_OUTPATIENT_CLINIC_OR_DEPARTMENT_OTHER): Payer: Medicare Other | Admitting: Hematology & Oncology

## 2020-05-09 ENCOUNTER — Inpatient Hospital Stay: Payer: Medicare Other

## 2020-05-09 ENCOUNTER — Other Ambulatory Visit: Payer: Self-pay | Admitting: Student

## 2020-05-09 ENCOUNTER — Encounter: Payer: Self-pay | Admitting: Hematology & Oncology

## 2020-05-09 VITALS — BP 132/41 | HR 50 | Temp 98.2°F | Resp 20 | Ht 72.0 in | Wt 264.0 lb

## 2020-05-09 DIAGNOSIS — D649 Anemia, unspecified: Secondary | ICD-10-CM

## 2020-05-09 DIAGNOSIS — I5033 Acute on chronic diastolic (congestive) heart failure: Secondary | ICD-10-CM | POA: Diagnosis not present

## 2020-05-09 MED ORDER — EPOETIN ALFA-EPBX 40000 UNIT/ML IJ SOLN
40000.0000 [IU] | Freq: Once | INTRAMUSCULAR | Status: AC
  Administered 2020-05-09: 40000 [IU] via SUBCUTANEOUS

## 2020-05-09 MED ORDER — EPOETIN ALFA-EPBX 40000 UNIT/ML IJ SOLN
INTRAMUSCULAR | Status: AC
  Filled 2020-05-09: qty 1

## 2020-05-09 NOTE — H&P (Signed)
Chief Complaint: Patient was seen in consultation today for an image-guided bone marrow biopsy and aspiration.   Referring Physician(s): Orson Slick  Supervising Physician: Markus Daft  Patient Status: [facility] - Out-pt  History of Present Illness: Eric [name] is a 79 y.o. male with a medical history that includes atrial fibrillation (on Eliquis), heart failure, anemia and thrombocytopenia. He has been requiring frequent blood and iron infusions as well as EPO injections. Despite these interventions his hemoglobin and platelet levels continue to be outside of acceptable limits.   Interventional Radiology has been asked to evaluate this patient for an image-guided bone marrow biopsy and aspiration for further workup and diagnosis.    Past Medical History:  Diagnosis Date   Anemia    Bilateral lower extremity edema    Chronic atrial fibrillation ([name])    Diastolic heart failure ([place])    Gall bladder disease 2020   History of right hip replacement    Pt stated 8-9 yrs ago    Past Surgical History:  Procedure Laterality Date   APPENDECTOMY     CHOLECYSTECTOMY     TOTAL HIP ARTHROPLASTY      Allergies: Iohexol, Morphine and related, Hydrocodone, Sulfa antibiotics, Sulfonamide derivatives, and Trazodone and nefazodone  Medications: Prior to Admission medications   Medication Sig Start Date End Date Taking? Authorizing Provider  acetaminophen (TYLENOL) 325 MG tablet Take 650 mg by mouth every 6 (six) hours as needed for moderate pain.     [provider]  allopurinol (ZYLOPRIM) 300 MG tablet Take 300 mg by mouth daily.    [provider]  augmented betamethasone dipropionate (DIPROLENE-AF) 0.05 % cream Apply topically 2 (two) times daily as needed (rash).  01/08/20   [provider]  diltiazem (TIAZAC) 360 MG 24 hr capsule Take 360 mg by mouth daily. 11/27/19   [provider]  ELIQUIS 5 MG TABS tablet Take 5 mg by mouth 2 (two)  times daily. Taking once daily 01/14/20   [provider]  insulin glargine, 1 Unit Dial, (TOUJEO SOLOSTAR) 300 UNIT/ML Solostar Pen Inject 55 Units into the skin daily.  08/31/15   [provider]  insulin lispro (HUMALOG) 100 UNIT/ML KwikPen Inject 19 Units into the skin 3 (three) times daily.  04/15/15   [provider]  levothyroxine (SYNTHROID) 100 MCG tablet Take 50-100 mcg by mouth See admin instructions. Takes 100 mg daily except Sundays, on Sunday takes 50 mg 01/14/20   [provider]  silodosin (RAPAFLO) 8 MG CAPS capsule Take 8 mg by mouth daily.  12/13/19   [provider]  simvastatin (ZOCOR) 20 MG tablet Take 20 mg by mouth at bedtime. 11/27/19   [provider]  spironolactone (ALDACTONE) 50 MG tablet Take 50 mg by mouth daily. 11/27/19   [provider]  torsemide (DEMADEX) 20 MG tablet Take 20 mg by mouth daily. 08/31/19   [provider]  traZODone (DESYREL) 50 MG tablet  03/14/20 04/19/20  [provider]     History reviewed. No pertinent family history.  Social History   Socioeconomic History   Marital status: Married    Spouse name: Not on file   Number of children: Not on file   Years of education: Not on file   Highest education level: Not on file  Occupational History   Not on file  Tobacco Use   Smoking status: Never Smoker   Smokeless tobacco: Never Used  Vaping Use   Vaping Use: Never  used  Substance and Sexual Activity   Alcohol use: Never   Drug use: Never   Sexual activity: Yes    Partners: Female  Other Topics Concern   Not on file  Social History Narrative   Not on file   Social Determinants of Health   Financial Resource Strain:    Difficulty of Paying Living Expenses: Not on file  Food Insecurity:    Worried About Charity fundraiser in the Last Year: Not on file   [employer] of Food in the Last Year: Not on file  Transportation Needs:    Lack of  Transportation (Medical): Not on file   Lack of Transportation (Non-Medical): Not on file  Physical Activity:    Days of Exercise per Week: Not on file   Minutes of Exercise per Session: Not on file  Stress:    Feeling of Stress : Not on file  Social Connections:    Frequency of Communication with Friends and Family: Not on file   Frequency of Social Gatherings with Friends and Family: Not on file   Attends Religious Services: Not on file   Active Member of Clubs or Organizations: Not on file   Attends Archivist Meetings: Not on file   Marital Status: Not on file    Review of Systems: A 12 point ROS discussed and pertinent positives are indicated in the HPI above.  All other systems are negative.  Review of Systems  Constitutional: Positive for fatigue. Negative for appetite change.  Respiratory: Positive for shortness of breath.   Cardiovascular: Positive for leg swelling. Negative for chest pain.  Gastrointestinal: Positive for diarrhea. Negative for abdominal pain, nausea and vomiting.  Musculoskeletal: Negative for back pain.  Neurological: Negative for headaches.    Vital Signs: BP (!) 146/51 (BP Location: Right Arm)    Pulse (!) 55    Temp 97.7 F (36.5 C) (Oral)    Resp 18    SpO2 100%   Physical Exam Constitutional:      General: He is not in acute distress.    Appearance: He is obese.  HENT:     Mouth/Throat:     Mouth: Mucous membranes are moist.     Pharynx: Oropharynx is clear.  Cardiovascular:     Rate and Rhythm: Normal rate. Rhythm irregular.     Pulses: Normal pulses.     Heart sounds: Normal heart sounds.     Comments: A-fib type rhythm auscultated.  Pulmonary:     Effort: Pulmonary effort is normal.     Breath sounds: Normal breath sounds.  Abdominal:     General: Bowel sounds are normal.     Palpations: Abdomen is soft.  Musculoskeletal:     Right lower leg: Edema present.     Left lower leg: Edema present.     Comments: +4  bilateral lower extremity edema with blisters and erythema.  Skin:    General: Skin is warm and dry.  Neurological:     Mental Status: He is alert and oriented to person, place, and time.     Imaging: DG Chest 2 View  Result Date: 04/19/2020 CLINICAL DATA:  Shortness of breath. Congestive heart failure. Progressive symptoms. EXAM: CHEST - 2 VIEW COMPARISON:  One-view chest x-ray 03/21/2020 FINDINGS: Heart is enlarged. Mild edema is present. Asymmetric right pleural effusion and lower lobe airspace disease is present. Visualized soft tissues and bony thorax are unremarkable. IMPRESSION: 1. Cardiomegaly and mild edema compatible with congestive heart  failure. 2. Asymmetric right pleural effusion and lower lobe airspace disease. This has been a chronic recurrent finding. It is worse than on the prior study. Infection is not excluded. Electronically Signed   By: San Morelle M.D.   On: 04/19/2020 04:33    Labs:  CBC: Recent Labs    04/29/20 1104 05/02/20 1410 05/08/20 1008 05/10/20 0738  WBC 4.7 5.0 3.5* 4.0  HGB 8.0* 7.9* 8.1* 8.5*  HCT 25.1* 24.9* 24.9* 25.8*  PLT 75* 73* 69* 98*    COAGS: No results for input(s): INR, APTT in the last 8760 hours.  BMP: Recent Labs    04/19/20 0450 04/25/20 1409 04/29/20 1104 05/02/20 1410  NA 137 138 139 139  K 5.2* 5.6* 4.9 5.1  CL 105 108 110 108  CO2 21* 23 21* 23  GLUCOSE 192* 144* 75 97  BUN 80* 83* 71* 83*  CALCIUM 8.2* 9.3 9.3 9.5  CREATININE 2.82* 2.55* 2.69* 2.77*  GFRNONAA 20* 23* 22* 21*  GFRAA 24* 27* 25* 24*    LIVER FUNCTION TESTS: Recent Labs    04/19/20 0450 04/25/20 1409 04/29/20 1104 05/02/20 1410  BILITOT 2.3* 2.2* 3.4* 2.6*  AST _0 14*  ALT _1 ALKPHOS 115 106 127* 109  PROT 6.6 6.4* 6.7 6.4*  ALBUMIN 3.5 3.9 3.6 4.0    TUMOR MARKERS: No results for input(s): AFPTM, CEA, CA199, CHROMGRNA in the last 8760 hours.  Assessment and Plan:  Refractory normocytic anemia;  persistent thrombocytopenia: Eric [name], 79 year old male, presents today to the [facility] department for an image-guided bone marrow biopsy and aspiration.   Risks and benefits of a bone marrow biopsy and aspiration were discussed with the patient and/or patient's family including, but not limited to bleeding, infection, damage to adjacent structures or low yield requiring additional tests.  All of the questions were answered and there is agreement to proceed.  Mr. Watling has been NPO since last night. He has held his Eliquis since Tuesday 05/07/20. Labs and vitals have been reviewed.   Consent signed and in chart.  Thank you for this interesting consult.  I greatly enjoyed meeting Khalen Styer and look forward to participating in their care.  A copy of this report was sent to the requesting provider on this date.  Electronically Signed: Soyla Dryer, AGACNP-BC [phone] 05/10/2020, 8:22 AM   I spent a total of  30 Minutes   in face to face in clinical consultation, greater than 50% of which was counseling/coordinating care for image-guided bone marrow biopsy and aspiration.

## 2020-05-10 ENCOUNTER — Other Ambulatory Visit: Payer: Self-pay

## 2020-05-10 ENCOUNTER — Ambulatory Visit (HOSPITAL_COMMUNITY)
Admission: RE | Admit: 2020-05-10 | Discharge: 2020-05-10 | Disposition: A | Discharge status: Home / Self Care | Payer: Medicare Other | Source: Ambulatory Visit | Attending: Hematology and Oncology | Admitting: Hematology and Oncology

## 2020-05-10 ENCOUNTER — Telehealth: Payer: Self-pay | Admitting: Hematology & Oncology

## 2020-05-10 ENCOUNTER — Encounter (HOSPITAL_COMMUNITY): Payer: Self-pay

## 2020-05-10 DIAGNOSIS — I4891 Unspecified atrial fibrillation: Secondary | ICD-10-CM | POA: Insufficient documentation

## 2020-05-10 DIAGNOSIS — Z79899 Other long term (current) drug therapy: Secondary | ICD-10-CM | POA: Insufficient documentation

## 2020-05-10 DIAGNOSIS — N184 Chronic kidney disease, stage 4 (severe): Secondary | ICD-10-CM | POA: Insufficient documentation

## 2020-05-10 DIAGNOSIS — I5030 Unspecified diastolic (congestive) heart failure: Secondary | ICD-10-CM | POA: Insufficient documentation

## 2020-05-10 DIAGNOSIS — D696 Thrombocytopenia, unspecified: Secondary | ICD-10-CM | POA: Insufficient documentation

## 2020-05-10 DIAGNOSIS — Z794 Long term (current) use of insulin: Secondary | ICD-10-CM | POA: Insufficient documentation

## 2020-05-10 DIAGNOSIS — D7589 Other specified diseases of blood and blood-forming organs: Secondary | ICD-10-CM | POA: Insufficient documentation

## 2020-05-10 DIAGNOSIS — D649 Anemia, unspecified: Secondary | ICD-10-CM | POA: Insufficient documentation

## 2020-05-10 DIAGNOSIS — Z79890 Hormone replacement therapy (postmenopausal): Secondary | ICD-10-CM | POA: Insufficient documentation

## 2020-05-10 DIAGNOSIS — Z7901 Long term (current) use of anticoagulants: Secondary | ICD-10-CM | POA: Diagnosis not present

## 2020-05-10 LAB — CBC WITH DIFFERENTIAL/PLATELET
Abs Immature Granulocytes: 0.01 10*3/uL (ref 0.00–0.07)
Basophils Absolute: 0 10*3/uL (ref 0.0–0.1)
Basophils Relative: 1 %
Eosinophils Absolute: 0.1 10*3/uL (ref 0.0–0.5)
Eosinophils Relative: 3 %
HCT: 25.8 % — ABNORMAL LOW (ref 39.0–52.0)
Hemoglobin: 8.5 g/dL — ABNORMAL LOW (ref 13.0–17.0)
Immature Granulocytes: 0 %
Lymphocytes Relative: 17 %
Lymphs Abs: 0.7 10*3/uL (ref 0.7–4.0)
MCH: 32.2 pg (ref 26.0–34.0)
MCHC: 32.9 g/dL (ref 30.0–36.0)
MCV: 97.7 fL (ref 80.0–100.0)
Monocytes Absolute: 0.5 10*3/uL (ref 0.1–1.0)
Monocytes Relative: 11 %
Neutro Abs: 2.7 10*3/uL (ref 1.7–7.7)
Neutrophils Relative %: 68 %
Platelets: 98 10*3/uL — ABNORMAL LOW (ref 150–400)
RBC: 2.64 MIL/uL — ABNORMAL LOW (ref 4.22–5.81)
RDW: 18.2 % — ABNORMAL HIGH (ref 11.5–15.5)
WBC: 4 10*3/uL (ref 4.0–10.5)
nRBC: 0 % (ref 0.0–0.2)

## 2020-05-10 LAB — GLUCOSE, CAPILLARY
Glucose-Capillary: 115 mg/dL — ABNORMAL HIGH (ref 70–99)
Glucose-Capillary: 118 mg/dL — ABNORMAL HIGH (ref 70–99)

## 2020-05-10 MED ORDER — FLUMAZENIL 0.5 MG/5ML IV SOLN
INTRAVENOUS | Status: AC
  Filled 2020-05-10: qty 5

## 2020-05-10 MED ORDER — MIDAZOLAM HCL 2 MG/2ML IJ SOLN
INTRAMUSCULAR | Status: AC | PRN
  Administered 2020-05-10: 1 mg via INTRAVENOUS
  Administered 2020-05-10 (×2): 0.5 mg via INTRAVENOUS

## 2020-05-10 MED ORDER — LIDOCAINE HCL (PF) 1 % IJ SOLN
INTRAMUSCULAR | Status: AC | PRN
  Administered 2020-05-10 (×2): 10 mL via INTRADERMAL

## 2020-05-10 MED ORDER — FENTANYL CITRATE (PF) 100 MCG/2ML IJ SOLN
INTRAMUSCULAR | Status: AC | PRN
  Administered 2020-05-10 (×2): 25 ug via INTRAVENOUS

## 2020-05-10 MED ORDER — MIDAZOLAM HCL 2 MG/2ML IJ SOLN
INTRAMUSCULAR | Status: AC
  Filled 2020-05-10: qty 2

## 2020-05-10 MED ORDER — FENTANYL CITRATE (PF) 100 MCG/2ML IJ SOLN
INTRAMUSCULAR | Status: AC
  Filled 2020-05-10: qty 2

## 2020-05-10 MED ORDER — NALOXONE HCL 0.4 MG/ML IJ SOLN
INTRAMUSCULAR | Status: AC
  Filled 2020-05-10: qty 1

## 2020-05-10 MED ORDER — SODIUM CHLORIDE 0.9 % IV SOLN: INTRAVENOUS | Status: DC

## 2020-05-10 NOTE — Telephone Encounter (Signed)
No los 8/19

## 2020-05-10 NOTE — Discharge Instructions (Signed)
Please call Interventional Radiology clinic [phone] with any questions or concerns.  You may remove your dressing and shower tomorrow.   Bone Marrow Aspiration and Bone Marrow Biopsy, Adult, Care After This sheet gives you information about how to care for yourself after your procedure. Your health care provider may also give you more specific instructions. If you have problems or questions, contact your health care provider. What can I expect after the procedure? After the procedure, it is common to have:  Mild pain and tenderness.  Swelling.  Bruising. Follow these instructions at home: Puncture site care   Follow instructions from your health care provider about how to take care of the puncture site. Make sure you: ? Wash your hands with soap and water before and after you change your bandage (dressing). If soap and water are not available, use hand sanitizer. ? Change your dressing as told by your health care provider.  Check your puncture site every day for signs of infection. Check for: ? More redness, swelling, or pain. ? Fluid or blood. ? Warmth. ? Pus or a bad smell. Activity  Return to your normal activities as told by your health care provider. Ask your health care provider what activities are safe for you.  Do not lift anything that is heavier than 10 lb (4.5 kg), or the limit that you are told, until your health care provider says that it is safe.  Do not drive for 24 hours if you were given a sedative during your procedure. General instructions   Take over-the-counter and prescription medicines only as told by your health care provider.  Do not take baths, swim, or use a hot tub until your health care provider approves. Ask your health care provider if you may take showers. You may only be allowed to take sponge baths.  If directed, put ice on the affected area. To do this: ? Put ice in a plastic bag. ? Place a towel between your skin and the  bag. ? Leave the ice on for 20 minutes, 2-3 times a day.  Keep all follow-up visits as told by your health care provider. This is important. Contact a health care provider if:  Your pain is not controlled with medicine.  You have a fever.  You have more redness, swelling, or pain around the puncture site.  You have fluid or blood coming from the puncture site.  Your puncture site feels warm to the touch.  You have pus or a bad smell coming from the puncture site. Summary  After the procedure, it is common to have mild pain, tenderness, swelling, and bruising.  Follow instructions from your health care provider about how to take care of the puncture site and what activities are safe for you.  Take over-the-counter and prescription medicines only as told by your health care provider.  Contact a health care provider if you have any signs of infection, such as fluid or blood coming from the puncture site. This information is not intended to replace advice given to you by your health care provider. Make sure you discuss any questions you have with your health care provider. Document Revised: 01/24/2019 Document Reviewed: 01/24/2019 Elsevier Patient Education  2020 Elsevier Inc.   Moderate Conscious Sedation, Adult, Care After These instructions provide you with information about caring for yourself after your procedure. Your health care provider may also give you more specific instructions. Your treatment has been planned according to current medical practices, but problems sometimes occur. Call   your health care provider if you have any problems or questions after your procedure. What can I expect after the procedure? After your procedure, it is common:  To feel sleepy for several hours.  To feel clumsy and have poor balance for several hours.  To have poor judgment for several hours.  To vomit if you eat too soon. Follow these instructions at home: For at least 24 hours after  the procedure:   Do not: ? Participate in activities where you could fall or become injured. ? Drive. ? Use heavy machinery. ? Drink alcohol. ? Take sleeping pills or medicines that cause drowsiness. ? Make important decisions or sign legal documents. ? Take care of children on your own.  Rest. Eating and drinking  Follow the diet recommended by your health care provider.  If you vomit: ? Drink water, juice, or soup when you can drink without vomiting. ? Make sure you have little or no nausea before eating solid foods. General instructions  Have a responsible adult stay with you until you are awake and alert.  Take over-the-counter and prescription medicines only as told by your health care provider.  If you smoke, do not smoke without supervision.  Keep all follow-up visits as told by your health care provider. This is important. Contact a health care provider if:  You keep feeling nauseous or you keep vomiting.  You feel light-headed.  You develop a rash.  You have a fever. Get help right away if:  You have trouble breathing. This information is not intended to replace advice given to you by your health care provider. Make sure you discuss any questions you have with your health care provider. Document Revised: 08/20/2017 Document Reviewed: 12/28/2015 Elsevier Patient Education  2020 Elsevier Inc.  

## 2020-05-10 NOTE — Procedures (Signed)
Interventional Radiology Procedure:   Indications: Anemia and thrombocytopenia  Procedure: CT guided bone marrow biopsy  Findings: 2 aspirates and 1 core from left ilium  Complications: None     EBL: Minimal, less than 10 ml  Plan: Discharge to home in one hour.   Eric [name] R. Anselm Pancoast, MD  Pager: [phone]

## 2020-05-13 ENCOUNTER — Ambulatory Visit: Payer: Medicare Other | Admitting: Hematology and Oncology

## 2020-05-13 ENCOUNTER — Other Ambulatory Visit: Payer: Medicare Other

## 2020-05-13 ENCOUNTER — Ambulatory Visit: Payer: Medicare Other

## 2020-05-15 ENCOUNTER — Other Ambulatory Visit: Payer: Self-pay | Admitting: *Deleted

## 2020-05-15 DIAGNOSIS — D696 Thrombocytopenia, unspecified: Secondary | ICD-10-CM

## 2020-05-15 DIAGNOSIS — D649 Anemia, unspecified: Secondary | ICD-10-CM

## 2020-05-16 ENCOUNTER — Other Ambulatory Visit: Payer: Self-pay

## 2020-05-16 ENCOUNTER — Telehealth: Payer: Self-pay | Admitting: *Deleted

## 2020-05-16 ENCOUNTER — Inpatient Hospital Stay: Payer: Medicare Other

## 2020-05-16 ENCOUNTER — Encounter (HOSPITAL_COMMUNITY): Payer: Self-pay | Admitting: Hematology and Oncology

## 2020-05-16 ENCOUNTER — Other Ambulatory Visit: Payer: Self-pay | Admitting: *Deleted

## 2020-05-16 ENCOUNTER — Other Ambulatory Visit: Payer: Self-pay | Admitting: Family

## 2020-05-16 DIAGNOSIS — D649 Anemia, unspecified: Secondary | ICD-10-CM

## 2020-05-16 DIAGNOSIS — D696 Thrombocytopenia, unspecified: Secondary | ICD-10-CM

## 2020-05-16 LAB — CBC WITH DIFFERENTIAL (CANCER CENTER ONLY)
Abs Immature Granulocytes: 0.01 10*3/uL (ref 0.00–0.07)
Basophils Absolute: 0 10*3/uL (ref 0.0–0.1)
Basophils Relative: 1 %
Eosinophils Absolute: 0.1 10*3/uL (ref 0.0–0.5)
Eosinophils Relative: 2 %
HCT: 25.1 % — ABNORMAL LOW (ref 39.0–52.0)
Hemoglobin: 8 g/dL — ABNORMAL LOW (ref 13.0–17.0)
Immature Granulocytes: 0 %
Lymphocytes Relative: 24 %
Lymphs Abs: 1.1 10*3/uL (ref 0.7–4.0)
MCH: 31.5 pg (ref 26.0–34.0)
MCHC: 31.9 g/dL (ref 30.0–36.0)
MCV: 98.8 fL (ref 80.0–100.0)
Monocytes Absolute: 0.5 10*3/uL (ref 0.1–1.0)
Monocytes Relative: 11 %
Neutro Abs: 2.8 10*3/uL (ref 1.7–7.7)
Neutrophils Relative %: 62 %
Platelet Count: 85 10*3/uL — ABNORMAL LOW (ref 150–400)
RBC: 2.54 MIL/uL — ABNORMAL LOW (ref 4.22–5.81)
RDW: 19 % — ABNORMAL HIGH (ref 11.5–15.5)
WBC Count: 4.5 10*3/uL (ref 4.0–10.5)
nRBC: 0 % (ref 0.0–0.2)

## 2020-05-16 LAB — SAMPLE TO BLOOD BANK

## 2020-05-16 LAB — PREPARE RBC (CROSSMATCH)

## 2020-05-16 NOTE — Telephone Encounter (Signed)
Hgb 8.0 Dr Marin Olp notified.  1 u blood ordered per Dr Marin Olp

## 2020-05-17 ENCOUNTER — Inpatient Hospital Stay: Payer: Medicare Other

## 2020-05-17 DIAGNOSIS — D649 Anemia, unspecified: Secondary | ICD-10-CM | POA: Diagnosis not present

## 2020-05-17 MED ORDER — SODIUM CHLORIDE 0.9% IV SOLUTION
250.0000 mL | Freq: Once | INTRAVENOUS | Status: AC
  Administered 2020-05-17: 250 mL via INTRAVENOUS
  Filled 2020-05-17: qty 250

## 2020-05-17 MED ORDER — ACETAMINOPHEN 325 MG PO TABS
ORAL_TABLET | ORAL | Status: AC
  Filled 2020-05-17: qty 2

## 2020-05-17 MED ORDER — DIPHENHYDRAMINE HCL 25 MG PO CAPS
25.0000 mg | ORAL_CAPSULE | Freq: Once | ORAL | Status: AC
  Administered 2020-05-17: 25 mg via ORAL

## 2020-05-17 MED ORDER — FUROSEMIDE 10 MG/ML IJ SOLN: 20.0000 mg | Freq: Once | INTRAMUSCULAR | Status: DC

## 2020-05-17 MED ORDER — DIPHENHYDRAMINE HCL 25 MG PO CAPS
ORAL_CAPSULE | ORAL | Status: AC
  Filled 2020-05-17: qty 1

## 2020-05-17 MED ORDER — ACETAMINOPHEN 325 MG PO TABS
650.0000 mg | ORAL_TABLET | Freq: Once | ORAL | Status: AC
  Administered 2020-05-17: 650 mg via ORAL

## 2020-05-17 NOTE — Patient Instructions (Signed)

## 2020-05-18 LAB — TYPE AND SCREEN
ABO/RH(D): O POS
Antibody Screen: NEGATIVE
Unit division: 0

## 2020-05-18 LAB — BPAM RBC
Blood Product Expiration Date: 202109282359
ISSUE DATE / TIME: 202108270807
Unit Type and Rh: 5100

## 2020-05-19 NOTE — Progress Notes (Signed)
[place] [phone]   [phone]  PROGRESS NOTE  Patient Care Team: Forrestine Him, Hershal Coria as PCP - General (Family Medicine)  Hematological/Oncological History # Thrombocytopenia # Normocytic Anemia 1) 02/12/2020: WBC 5.9, Hgb 9.9, MCV 91, Plt 88 2) 02/19/2020: WBC 4.4, Hgb 7.3, MCV 89.9, Plt 102 3) 03/08/2020: WBC 6.0, Hgb 7.0, Plt 141, MCV 96.2 4) 03/20/2020: establish care with Dr. Lorenso Courier. Recommend started q 2 week Retacrit at 20,000 units.   5) 7/1-04/26/2020: patient received 7 units of PRBC 6) 04/26/2020: increased Retacrit to 40,000 units.  7) 04/29/2020: WBC 4.7, Hgb 8.0. MCV 97.3, Plt 75  Interval History:  Eric [name] 79 y.o. male with medical history significant for normocytic anemia and thrombocytopenia who presents for a follow up visit. The patient's last visit was on 03/20/2020 at which time he established care. In the interim since the last visit he has been receiving frequent blood transfusions in addition to iron infusion/EPO shots at [facility].   On exam today Eric [name] notes that he has continued to have leg swelling and shortness of breath since our last visit.  He unfortunately had to go to the emergency department on 04/19/2020 as result of the symptoms.  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  He notes otherwise that his appetite has been good and his weight has been increasing, though there may be some contribution of fluid that this.  He is gone from 2 and 54 pounds up to 266.  He currently denies having any issues with fevers, chills, sweats, nausea, vomiting or diarrhea.  He is not having any other overt signs of bleeding or bruising.  A full 10 point ROS is listed below.  MEDICAL HISTORY:  Past Medical History:  Diagnosis Date  . Anemia   . Bilateral lower extremity edema   . Chronic atrial fibrillation ([place])   . Diastolic heart failure ([place])   . Gall bladder disease 2020  . History  of right hip replacement    Pt stated 8-9 yrs ago    SURGICAL HISTORY: Past Surgical History:  Procedure Laterality Date  . APPENDECTOMY    . CHOLECYSTECTOMY    . TOTAL HIP ARTHROPLASTY      SOCIAL HISTORY: Social History   Socioeconomic History  . Marital status: Married    Spouse name: Not on file  . Number of children: Not on file  . Years of education: Not on file  . Highest education level: Not on file  Occupational History  . Not on file  Tobacco Use  . Smoking status: Never Smoker  . Smokeless tobacco: Never Used  Vaping Use  . Vaping Use: Never used  Substance and Sexual Activity  . Alcohol use: Never  . Drug use: Never  . Sexual activity: Yes    Partners: Female  Other Topics Concern  . Not on file  Social History Narrative  . Not on file   Social Determinants of Health   Financial Resource Strain:   . Difficulty of Paying Living Expenses: Not on file  Food Insecurity:   . Worried About Charity fundraiser in the Last Year: Not on file  . Ran Out of Food in the Last Year: Not on file  Transportation Needs:   . Lack of Transportation (Medical): Not on file  . Lack of Transportation (Non-Medical): Not on file  Physical Activity:   . Days of Exercise per Week: Not on file  .  Minutes of Exercise per Session: Not on file  Stress:   . Feeling of Stress : Not on file  Social Connections:   . Frequency of Communication with Friends and Family: Not on file  . Frequency of Social Gatherings with Friends and Family: Not on file  . Attends Religious Services: Not on file  . Active Member of Clubs or Organizations: Not on file  . Attends Archivist Meetings: Not on file  . Marital Status: Not on file  Intimate Partner Violence:   . Fear of Current or Ex-Partner: Not on file  . Emotionally Abused: Not on file  . Physically Abused: Not on file  . Sexually Abused: Not on file    FAMILY HISTORY: No family history on file.  ALLERGIES:  is  allergic to iohexol, morphine and related, hydrocodone, sulfa antibiotics, sulfonamide derivatives, and trazodone and nefazodone.  MEDICATIONS:  Current Outpatient Medications  Medication Sig Dispense Refill  . acetaminophen (TYLENOL) 325 MG tablet Take 650 mg by mouth every 6 (six) hours as needed for moderate pain.     Marland Kitchen allopurinol (ZYLOPRIM) 300 MG tablet Take 300 mg by mouth daily.    Marland Kitchen diltiazem (TIAZAC) 360 MG 24 hr capsule Take 360 mg by mouth daily.    Marland Kitchen ELIQUIS 5 MG TABS tablet Take 5 mg by mouth 2 (two) times daily. Taking once daily    . insulin glargine, 1 Unit Dial, (TOUJEO SOLOSTAR) 300 UNIT/ML Solostar Pen Inject 55 Units into the skin daily.     . insulin lispro (HUMALOG) 100 UNIT/ML KwikPen Inject 19 Units into the skin 3 (three) times daily.     Marland Kitchen levothyroxine (SYNTHROID) 100 MCG tablet Take 50-100 mcg by mouth See admin instructions. Takes 100 mg daily except Sundays, on Sunday takes 50 mg    . silodosin (RAPAFLO) 8 MG CAPS capsule Take 8 mg by mouth daily.     . simvastatin (ZOCOR) 20 MG tablet Take 20 mg by mouth at bedtime.    Marland Kitchen spironolactone (ALDACTONE) 50 MG tablet Take 50 mg by mouth daily.    Marland Kitchen torsemide (DEMADEX) 20 MG tablet Take 20 mg by mouth daily.     No current facility-administered medications for this visit.    REVIEW OF SYSTEMS:   Constitutional: ( - ) fevers, ( - )  chills , ( - ) night sweats Eyes: ( - ) blurriness of vision, ( - ) double vision, ( - ) watery eyes Ears, nose, mouth, throat, and face: ( - ) mucositis, ( - ) sore throat Respiratory: ( - ) cough, ( - ) dyspnea, ( - ) wheezes Cardiovascular: ( - ) palpitation, ( - ) chest discomfort, ( - ) lower extremity swelling Gastrointestinal:  ( - ) nausea, ( - ) heartburn, ( - ) change in bowel habits Skin: ( - ) abnormal skin rashes Lymphatics: ( - ) new lymphadenopathy, ( - ) easy bruising Neurological: ( - ) numbness, ( - ) tingling, ( - ) new weaknesses Behavioral/Psych: ( - ) mood change,  ( - ) new changes  All other systems were reviewed with the patient and are negative.  PHYSICAL EXAMINATION: ECOG PERFORMANCE STATUS: 2 - Symptomatic, <50% confined to bed  Vitals:   05/09/20 1450  BP: (!) 132/41  Pulse: (!) 50  Resp: 20  Temp: 98.2 F (36.8 C)  SpO2: 99%   Filed Weights   05/09/20 1450  Weight: 264 lb (119.7 kg)    GENERAL: well appearing  elderly caucasian male in  NAD. alert, no distress and comfortable SKIN: skin color, texture, turgor are normal, no rashes or significant lesions EYES: conjunctiva are pink and non-injected, sclera clear LUNGS: clear to auscultation and percussion with normal breathing effort HEART: regular rate & rhythm and no murmurs. +3 bilateral lower extremity edema ABDOMEN: soft, non-tender, non-distended, normal bowel sounds Musculoskeletal: no cyanosis of digits and no clubbing  PSYCH: alert & oriented x 3, fluent speech NEURO: no focal motor/sensory deficits  LABORATORY DATA:  I have reviewed the data as listed CBC Latest Ref Rng & Units 05/16/2020 26-May-2020 05/08/2020  WBC 4.0 - 10.5 K/uL 4.5 4.0 3.5(L)  Hemoglobin 13.0 - 17.0 g/dL 8.0(L) 8.5(L) 8.1(L)  Hematocrit 39 - 52 % 25.1(L) 25.8(L) 24.9(L)  Platelets 150 - 400 K/uL 85(L) 98(L) 69(L)    CMP Latest Ref Rng & Units 05/02/2020 04/29/2020 04/25/2020  Glucose 70 - 99 mg/dL 97 75 144(H)  BUN 8 - 23 mg/dL 83(H) 71(H) 83(H)  Creatinine 0.61 - 1.24 mg/dL 2.77(H) 2.69(H) 2.55(H)  Sodium 135 - 145 mmol/L 139 139 138  Potassium 3.5 - 5.1 mmol/L 5.1 4.9 5.6(H)  Chloride 98 - 111 mmol/L 108 110 108  CO2 22 - 32 mmol/L 23 21(L) 23  Calcium 8.9 - 10.3 mg/dL 9.5 9.3 9.3  Total Protein 6.5 - 8.1 g/dL 6.4(L) 6.7 6.4(L)  Total Bilirubin 0.3 - 1.2 mg/dL 2.6(H) 3.4(H) 2.2(H)  Alkaline Phos 38 - 126 U/L 109 127(H) 106  AST 15 - 41 U/L 14(L) 17 15  ALT 0 - 44 U/L $Remo'10 12 12    'eLbgi$ Lab Results  Component Value Date   MPROTEIN 0.5 (H) 04/29/2020   Lab Results  Component Value Date    KPAFRELGTCHN 49.7 (H) 02/17/2020   LAMBDASER 40.1 (H) 02/17/2020   KAPLAMBRATIO 6.90 04/30/2020   KAPLAMBRATIO 1.24 02/17/2020    RADIOGRAPHIC STUDIES: CT Biopsy  Result Date: 05-26-2020 INDICATION: 79 year old with thrombocytopenia and anemia. EXAM: CT GUIDED BONE MARROW ASPIRATES AND BIOPSY Physician: Stephan Minister. Anselm Pancoast, MD MEDICATIONS: None. ANESTHESIA/SEDATION: Fentanyl 50 mcg IV; Versed 2.0 mg IV Moderate Sedation Time:  15 minutes The patient was continuously monitored during the procedure by the interventional radiology nurse under my direct supervision. COMPLICATIONS: None immediate. PROCEDURE: The procedure was explained to the patient. The risks and benefits of the procedure were discussed and the patient's questions were addressed. Informed consent was obtained from the patient. Patient was unable to lie prone. Therefore, the patient was placed in left lateral decubitus position. Images of the pelvis were obtained. Lower back was prepped with chlorhexidine and sterile field was created. The skin and left posterior ilium were anesthetized with 1% lidocaine. 11 gauge bone needle was directed into the left ilium with CT guidance. Two aspirates and one core biopsy were obtained. Bandage placed over the puncture site. IMPRESSION: CT guided bone marrow aspiration and core biopsy. Electronically Signed   By: Markus Daft M.D.   On: 05/26/2020 10:50   CT BONE MARROW BIOPSY & ASPIRATION  Result Date: 05-26-20 INDICATION: 79 year old with thrombocytopenia and anemia. EXAM: CT GUIDED BONE MARROW ASPIRATES AND BIOPSY Physician: Stephan Minister. Anselm Pancoast, MD MEDICATIONS: None. ANESTHESIA/SEDATION: Fentanyl 50 mcg IV; Versed 2.0 mg IV Moderate Sedation Time:  15 minutes The patient was continuously monitored during the procedure by the interventional radiology nurse under my direct supervision. COMPLICATIONS: None immediate. PROCEDURE: The procedure was explained to the patient. The risks and benefits of the procedure were  discussed and the patient's questions were addressed.  Informed consent was obtained from the patient. Patient was unable to lie prone. Therefore, the patient was placed in left lateral decubitus position. Images of the pelvis were obtained. Lower back was prepped with chlorhexidine and sterile field was created. The skin and left posterior ilium were anesthetized with 1% lidocaine. 11 gauge bone needle was directed into the left ilium with CT guidance. Two aspirates and one core biopsy were obtained. Bandage placed over the puncture site. IMPRESSION: CT guided bone marrow aspiration and core biopsy. Electronically Signed   By: Markus Daft M.D.   On: 05/10/2020 10:50    ASSESSMENT & PLAN Eric [name] 79 y.o. male with medical history significant for normocytic anemia and thrombocytopenia who presents for a follow up visit.  After review of the labs, discussion with the patient, and reviewed the prior records his findings are consistent with refractory normocytic anemia and thrombocytopenia.  During her prior visit it appeared that his thrombocytopenia was trending upward, however at this time it looks to be trending down again.  Additionally his hemoglobin has been difficult to keep above the baseline of 7.0, though there appears to be some improvement with the increase in the Epogen to 40,000 units.  Although the most likely etiology at this time still remains the patient's poor kidney function and low endogenous erythropoietin I would recommend we do a further evaluation particular in the setting of the thrombocytopenia are failing to rebound to normal.  We will perform a bone marrow biopsy and consider additional work-up based off the findings from that study.   #Refractory Normocytic Anemia #Persistent Thrombocytopenia --given the modest response to EPO and the worsening of the patient's platelet count I am concerned there may be a primary bone marrow disorder in addition to his kidney dyfunction. To  effectively r/o bone marrow disorder we have ordered a bone marrow biopsy. --dose of erythropoietin increased to 40,000 q 2 weeks. Hgb 8.0 today --kidney function is currently at baseline.  --iron stores appear replete, will hold on these iron infusions for now --patient has requested transfer to [facility]. Will discuss transfer of care with my colleague at that site.   #CKD Stage IV --currently under the care of a local nephrologist --defer to nephrology for diuretic management   No orders of the defined types were placed in this encounter.   All questions were answered. The patient knows to call the clinic with any problems, questions or concerns.  A total of more than 30 minutes were spent on this encounter and over half of that time was spent on counseling and coordination of care as outlined above.   Burney Gauze R 05/19/2020 2:11 PM

## 2020-05-20 LAB — SURGICAL PATHOLOGY

## 2020-05-21 ENCOUNTER — Other Ambulatory Visit: Payer: Self-pay | Admitting: Hematology & Oncology

## 2020-05-23 ENCOUNTER — Encounter: Payer: Self-pay | Admitting: Hematology & Oncology

## 2020-05-23 ENCOUNTER — Other Ambulatory Visit: Payer: Self-pay

## 2020-05-23 ENCOUNTER — Inpatient Hospital Stay (HOSPITAL_BASED_OUTPATIENT_CLINIC_OR_DEPARTMENT_OTHER): Payer: Medicare Other | Admitting: Hematology & Oncology

## 2020-05-23 ENCOUNTER — Inpatient Hospital Stay: Payer: Medicare Other | Attending: Hematology & Oncology

## 2020-05-23 DIAGNOSIS — D649 Anemia, unspecified: Secondary | ICD-10-CM | POA: Insufficient documentation

## 2020-05-23 DIAGNOSIS — Z79899 Other long term (current) drug therapy: Secondary | ICD-10-CM | POA: Insufficient documentation

## 2020-05-23 DIAGNOSIS — D461 Refractory anemia with ring sideroblasts: Secondary | ICD-10-CM

## 2020-05-23 DIAGNOSIS — D4620 Refractory anemia with excess of blasts, unspecified: Secondary | ICD-10-CM | POA: Diagnosis not present

## 2020-05-23 DIAGNOSIS — Z7189 Other specified counseling: Secondary | ICD-10-CM

## 2020-05-23 DIAGNOSIS — I5033 Acute on chronic diastolic (congestive) heart failure: Secondary | ICD-10-CM

## 2020-05-23 LAB — CBC WITH DIFFERENTIAL (CANCER CENTER ONLY)
Abs Immature Granulocytes: 0.01 10*3/uL (ref 0.00–0.07)
Basophils Absolute: 0 10*3/uL (ref 0.0–0.1)
Basophils Relative: 1 %
Eosinophils Absolute: 0.1 10*3/uL (ref 0.0–0.5)
Eosinophils Relative: 2 %
HCT: 25.3 % — ABNORMAL LOW (ref 39.0–52.0)
Hemoglobin: 8.3 g/dL — ABNORMAL LOW (ref 13.0–17.0)
Immature Granulocytes: 0 %
Lymphocytes Relative: 14 %
Lymphs Abs: 0.6 10*3/uL — ABNORMAL LOW (ref 0.7–4.0)
MCH: 31.6 pg (ref 26.0–34.0)
MCHC: 32.8 g/dL (ref 30.0–36.0)
MCV: 96.2 fL (ref 80.0–100.0)
Monocytes Absolute: 0.4 10*3/uL (ref 0.1–1.0)
Monocytes Relative: 10 %
Neutro Abs: 3.4 10*3/uL (ref 1.7–7.7)
Neutrophils Relative %: 73 %
Platelet Count: 75 10*3/uL — ABNORMAL LOW (ref 150–400)
RBC: 2.63 MIL/uL — ABNORMAL LOW (ref 4.22–5.81)
RDW: 17.2 % — ABNORMAL HIGH (ref 11.5–15.5)
WBC Count: 4.6 10*3/uL (ref 4.0–10.5)
nRBC: 0 % (ref 0.0–0.2)

## 2020-05-23 LAB — RETICULOCYTES
Immature Retic Fract: 11.9 % (ref 2.3–15.9)
RBC.: 2.69 MIL/uL — ABNORMAL LOW (ref 4.22–5.81)
Retic Count, Absolute: 112.7 10*3/uL (ref 19.0–186.0)
Retic Ct Pct: 4.2 % — ABNORMAL HIGH (ref 0.4–3.1)

## 2020-05-23 LAB — SAVE SMEAR(SSMR), FOR PROVIDER SLIDE REVIEW

## 2020-05-23 LAB — CMP (CANCER CENTER ONLY)
ALT: 8 U/L (ref 0–44)
AST: 13 U/L — ABNORMAL LOW (ref 15–41)
Albumin: 3.9 g/dL (ref 3.5–5.0)
Alkaline Phosphatase: 103 U/L (ref 38–126)
Anion gap: 9 (ref 5–15)
BUN: 71 mg/dL — ABNORMAL HIGH (ref 8–23)
CO2: 23 mmol/L (ref 22–32)
Calcium: 9.2 mg/dL (ref 8.9–10.3)
Chloride: 106 mmol/L (ref 98–111)
Creatinine: 2.63 mg/dL — ABNORMAL HIGH (ref 0.61–1.24)
GFR, Est AFR Am: 26 mL/min — ABNORMAL LOW (ref >60)
GFR, Estimated: 22 mL/min — ABNORMAL LOW (ref >60)
Glucose, Bld: 145 mg/dL — ABNORMAL HIGH (ref 70–99)
Potassium: 5 mmol/L (ref 3.5–5.1)
Sodium: 138 mmol/L (ref 135–145)
Total Bilirubin: 2.7 mg/dL — ABNORMAL HIGH (ref 0.3–1.2)
Total Protein: 6.8 g/dL (ref 6.5–8.1)

## 2020-05-23 LAB — SAMPLE TO BLOOD BANK

## 2020-05-24 ENCOUNTER — Inpatient Hospital Stay: Payer: Medicare Other

## 2020-05-24 ENCOUNTER — Other Ambulatory Visit: Payer: Self-pay

## 2020-05-24 ENCOUNTER — Other Ambulatory Visit: Payer: Self-pay | Admitting: *Deleted

## 2020-05-24 DIAGNOSIS — D649 Anemia, unspecified: Secondary | ICD-10-CM

## 2020-05-24 LAB — IRON AND TIBC
Iron: 67 ug/dL (ref 42–163)
Saturation Ratios: 23 % (ref 20–55)
TIBC: 296 ug/dL (ref 202–409)
UIBC: 229 ug/dL (ref 117–376)

## 2020-05-24 LAB — FERRITIN: Ferritin: 180 ng/mL (ref 24–336)

## 2020-05-24 LAB — PREPARE RBC (CROSSMATCH)

## 2020-05-24 MED ORDER — ACETAMINOPHEN 325 MG PO TABS: 650.0000 mg | ORAL_TABLET | Freq: Once | ORAL | Status: DC

## 2020-05-24 MED ORDER — SODIUM CHLORIDE 0.9% IV SOLUTION
250.0000 mL | Freq: Once | INTRAVENOUS | Status: AC
  Administered 2020-05-24: 250 mL via INTRAVENOUS
  Filled 2020-05-24: qty 250

## 2020-05-24 MED ORDER — DIPHENHYDRAMINE HCL 25 MG PO CAPS: 25.0000 mg | ORAL_CAPSULE | Freq: Once | ORAL | Status: DC

## 2020-05-24 NOTE — Patient Instructions (Signed)

## 2020-05-25 LAB — BPAM RBC
Blood Product Expiration Date: 202110012359
ISSUE DATE / TIME: 202109030803
Unit Type and Rh: 5100

## 2020-05-25 LAB — TYPE AND SCREEN
ABO/RH(D): O POS
Antibody Screen: NEGATIVE
Unit division: 0

## 2020-05-28 ENCOUNTER — Encounter: Payer: Self-pay | Admitting: Hematology & Oncology

## 2020-05-29 ENCOUNTER — Ambulatory Visit: Payer: Medicare Other | Admitting: Hematology and Oncology

## 2020-05-29 ENCOUNTER — Other Ambulatory Visit: Payer: Self-pay | Admitting: *Deleted

## 2020-05-29 ENCOUNTER — Other Ambulatory Visit: Payer: Medicare Other

## 2020-05-29 ENCOUNTER — Telehealth: Payer: Self-pay | Admitting: *Deleted

## 2020-05-29 ENCOUNTER — Encounter: Payer: Self-pay | Admitting: Hematology & Oncology

## 2020-05-29 ENCOUNTER — Telehealth: Payer: Self-pay | Admitting: Hematology & Oncology

## 2020-05-29 DIAGNOSIS — Z7189 Other specified counseling: Secondary | ICD-10-CM

## 2020-05-29 DIAGNOSIS — D461 Refractory anemia with ring sideroblasts: Secondary | ICD-10-CM

## 2020-05-29 DIAGNOSIS — D4620 Refractory anemia with excess of blasts, unspecified: Secondary | ICD-10-CM

## 2020-05-29 HISTORY — DX: Other specified counseling: Z71.89

## 2020-05-29 HISTORY — DX: Refractory anemia with excess of blasts, unspecified: D46.20

## 2020-05-29 HISTORY — DX: Refractory anemia with ring sideroblasts: D46.1

## 2020-05-29 NOTE — Telephone Encounter (Signed)
Call received from patient wanting to know what injection he will get tomorrow.  Pt notified per order of Dr. Marin Olp that he will receive Retacrit tomorrow and that Luspatercept is still in the approval process with his insurance.  Pt appreciative of call back and has no further questions at this time.

## 2020-05-29 NOTE — Progress Notes (Signed)
Hematology and Oncology Follow Up Visit  Eric [name] [id number] 12/15/1940 79 y.o. 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  He did go ahead and have the tumor biopsy done.  This is done on 05/10/2020.  The pathology report ([id number]) showed a hyper cellular marrow.  There was some slight increase in sideroblasts.  There were no myeloblasts.  There is no atypical lymphoid aggregates.  There is no monoclonal B-cell population.  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  He still is having issues with anemia.  His CBC today shows a white count of 4.6.  Hemoglobin 8.3.  Platelet count 75,000.  He probably would benefit from Luspatercept.  I think this might be able to help get his hemoglobin back up.  He really is not a great candidate for hypomethylating agents.  He does have some fatigue.  He probably does need to be transfused with 1 unit of blood.  There is no fever.  He has had no rashes.  He has had some slight leg swelling which is chronic.  He has had no change in bowel or bladder habits.  There is been no cough.  Has had no fever.  Overall, I would say that his his performance status is ECOG 1-2.  Medications:  Current Outpatient Medications:  .  acetaminophen (TYLENOL) 325 MG tablet, Take 650 mg by mouth every 6 (six) hours as needed for moderate pain. , Disp: , Rfl:  .  allopurinol (ZYLOPRIM) 100 MG tablet, Take 100 mg by mouth daily., Disp: , Rfl:  .  allopurinol (ZYLOPRIM) 300 MG tablet, Take 300 mg by mouth daily., Disp: , Rfl:  .  diltiazem (TIAZAC) 360 MG 24 hr capsule, Take 360 mg by mouth daily., Disp: , Rfl:  .  ELIQUIS 5 MG TABS tablet, Take 5 mg by mouth 2 (two) times daily. Taking once  daily, Disp: , Rfl:  .  insulin glargine, 1 Unit Dial, (TOUJEO SOLOSTAR) 300 UNIT/ML Solostar Pen, Inject 55 Units into the skin daily. , Disp: , Rfl:  .  insulin lispro (HUMALOG) 100 UNIT/ML KwikPen, Inject 19 Units into the skin 3 (three) times daily. , Disp: , Rfl:  .  levothyroxine (SYNTHROID) 100 MCG tablet, Take 50-100 mcg by mouth See admin instructions. Takes 100 mg daily except Sundays, on Sunday takes 50 mg, Disp: , Rfl:  .  silodosin (RAPAFLO) 8 MG CAPS capsule, Take 8 mg by mouth daily. , Disp: , Rfl:  .  simvastatin (ZOCOR) 20 MG tablet, Take 20 mg by mouth at bedtime., Disp: , Rfl:  .  spironolactone (ALDACTONE) 50 MG tablet, Take 50 mg by mouth daily., Disp: , Rfl:  .  torsemide (DEMADEX) 20 MG tablet, Take 20 mg by mouth daily., Disp: , Rfl:   Allergies:  Allergies  Allergen Reactions  . Iohexol Other (See Comments)     Desc: "shuts kidneys down"   . Morphine And Related Shortness Of Breath  . Hydrocodone Nausea And Vomiting  . Sulfa Antibiotics Other (See Comments)    UNKNOWN REACTION  . Sulfonamide Derivatives Other (See Comments)    UNKNOWN REACTION  . Trazodone And Nefazodone Rash    Pain  Past Medical History, Surgical history, Social history, and Family History were reviewed and updated.  Review of Systems: Review of Systems  Constitutional: Positive for fatigue.  HENT:  Negative.   Eyes: Negative.   Respiratory: Negative.   Cardiovascular: Positive for leg swelling.  Gastrointestinal: Negative.   Endocrine: Negative.   Genitourinary: Negative.    Musculoskeletal: Negative.   Skin: Negative.   Neurological: Negative.   Hematological: Negative.   Psychiatric/Behavioral: Negative.     Physical Exam:  weight is 262 lb (118.8 kg). His oral temperature is 97.5 F (36.4 C) (abnormal). His blood pressure is 136/40 (abnormal) and his pulse is 49 (abnormal). His respiration is 19 and oxygen saturation is 99%.   Wt Readings from Last 3 Encounters:    05/23/20 262 lb (118.8 kg)  05/09/20 264 lb (119.7 kg)  04/29/20 266 lb 8 oz (120.9 kg)    Physical Exam Vitals reviewed.  HENT:     Head: Normocephalic and atraumatic.  Eyes:     Pupils: Pupils are equal, round, and reactive to light.  Cardiovascular:     Rate and Rhythm: Normal rate and regular rhythm.     Heart sounds: Normal heart sounds.  Pulmonary:     Effort: Pulmonary effort is normal.     Breath sounds: Normal breath sounds.  Abdominal:     General: Bowel sounds are normal.     Palpations: Abdomen is soft.  Musculoskeletal:        General: No tenderness or deformity. Normal range of motion.     Cervical back: Normal range of motion.  Lymphadenopathy:     Cervical: No cervical adenopathy.  Skin:    General: Skin is warm and dry.     Findings: No erythema or rash.  Neurological:     Mental Status: He is alert and oriented to person, place, and time.  Psychiatric:        Behavior: Behavior normal.        Thought Content: Thought content normal.        Judgment: Judgment normal.      Lab Results  Component Value Date   WBC 4.6 05/23/2020   HGB 8.3 (L) 05/23/2020   HCT 25.3 (L) 05/23/2020   MCV 96.2 05/23/2020   PLT 75 (L) 05/23/2020     Chemistry      Component Value Date/Time   NA 138 05/23/2020 1152   K 5.0 05/23/2020 1152   CL 106 05/23/2020 1152   CO2 23 05/23/2020 1152   BUN 71 (H) 05/23/2020 1152   CREATININE 2.63 (H) 05/23/2020 1152      Component Value Date/Time   CALCIUM 9.2 05/23/2020 1152   ALKPHOS 103 05/23/2020 1152   AST 13 (L) 05/23/2020 1152   ALT 8 05/23/2020 1152   BILITOT 2.7 (H) 05/23/2020 1152      Impression and Plan: Eric [name] is a very nice 79 year old white male.  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  There is no blasts.  I would probably classify him as refractory anemia with ring sideroblasts.  As such, I think that Luspatercept would be a good idea for him.  I talked to he and his wife about this.   I explained what Luspatercept is.  I explained how it works.  I think he could tolerate it easily.  It may take a few months before we start to see any response.  As such, he may need to be transfused intermittently.  He would  be willing to try this.  I think Luspatercept would be quite reasonable.  For right now, we will have to see about transfusing him.  I would like to try to get this before the holiday weekend.  We will have to see about the Luspatercept being set up.  Somehow had make sure he comes in weekly for his Retacrit injections.  We will have to watch his iron studies.  I may start him on some vitamin B6.  I spent about 40 minutes or so with he and his wife today.  I went over the bone marrow report.  I went over my recommendations.  They are in agreement.  We will have him come back to see [place] the second dose of Luspatercept.   Volanda Napoleon, MD 9/8/20217:04 AM

## 2020-05-29 NOTE — Progress Notes (Signed)
START OFF PATHWAY REGIMEN - [facility]   [id number]:Luspatercept-aamt 1 mg/kg SUBQ D1 q21 Days:   A cycle is every 21 days:     Luspatercept-aamt   **Always confirm dose/schedule in your pharmacy ordering system**  Patient Characteristics: Lower-Risk (IPSS-R Score ? 3.5), Without Ring Sideroblasts, First Line, Symptomatic Anemia, EPO Level ? 500 WHO Disease Classification: MDS-SLD Bone Marrow Blasts (percent): ? 2% Cytogenetic Category: Very Good Platelets (x 10^9/L): 50 to < 100 Absolute Neutrophil Count (x 10^9/L): ? 0.8 Line of Therapy: First Line IPSS-R Risk Category: Very Low IPSS-R Risk Score: 1.5 Check here if patient's risk score was calculated prior to the International Prognostic Scoring System-Revised (IPSS-R): false Hemoglobin (g/dl): 8 to < 10 Disease Characteristics: Symptomatic Anemia Patient Characteristics: EPO Level ? 500 Intent of Therapy: Non-Curative / Palliative Intent, Discussed with Patient

## 2020-05-29 NOTE — Telephone Encounter (Signed)
Appointments scheduled and patient message sent through My Chart per 9/2(late los entered 9/8)

## 2020-05-30 ENCOUNTER — Other Ambulatory Visit: Payer: Self-pay

## 2020-05-30 ENCOUNTER — Inpatient Hospital Stay: Payer: Medicare Other

## 2020-05-30 ENCOUNTER — Other Ambulatory Visit: Payer: Self-pay | Admitting: Hematology & Oncology

## 2020-05-30 VITALS — BP 125/35 | HR 52 | Temp 97.6°F | Resp 17

## 2020-05-30 DIAGNOSIS — D649 Anemia, unspecified: Secondary | ICD-10-CM

## 2020-05-30 LAB — CBC WITH DIFFERENTIAL (CANCER CENTER ONLY)
Abs Immature Granulocytes: 0.01 10*3/uL (ref 0.00–0.07)
Basophils Absolute: 0 10*3/uL (ref 0.0–0.1)
Basophils Relative: 1 %
Eosinophils Absolute: 0.1 10*3/uL (ref 0.0–0.5)
Eosinophils Relative: 3 %
HCT: 25.4 % — ABNORMAL LOW (ref 39.0–52.0)
Hemoglobin: 8.3 g/dL — ABNORMAL LOW (ref 13.0–17.0)
Immature Granulocytes: 0 %
Lymphocytes Relative: 19 %
Lymphs Abs: 0.6 10*3/uL — ABNORMAL LOW (ref 0.7–4.0)
MCH: 31.6 pg (ref 26.0–34.0)
MCHC: 32.7 g/dL (ref 30.0–36.0)
MCV: 96.6 fL (ref 80.0–100.0)
Monocytes Absolute: 0.3 10*3/uL (ref 0.1–1.0)
Monocytes Relative: 8 %
Neutro Abs: 2.3 10*3/uL (ref 1.7–7.7)
Neutrophils Relative %: 69 %
Platelet Count: 68 10*3/uL — ABNORMAL LOW (ref 150–400)
RBC: 2.63 MIL/uL — ABNORMAL LOW (ref 4.22–5.81)
RDW: 16.8 % — ABNORMAL HIGH (ref 11.5–15.5)
WBC Count: 3.4 10*3/uL — ABNORMAL LOW (ref 4.0–10.5)
nRBC: 0 % (ref 0.0–0.2)

## 2020-05-30 LAB — CMP (CANCER CENTER ONLY)
ALT: 7 U/L (ref 0–44)
AST: 12 U/L — ABNORMAL LOW (ref 15–41)
Albumin: 3.7 g/dL (ref 3.5–5.0)
Alkaline Phosphatase: 100 U/L (ref 38–126)
Anion gap: 8 (ref 5–15)
BUN: 65 mg/dL — ABNORMAL HIGH (ref 8–23)
CO2: 24 mmol/L (ref 22–32)
Calcium: 9.5 mg/dL (ref 8.9–10.3)
Chloride: 109 mmol/L (ref 98–111)
Creatinine: 2.27 mg/dL — ABNORMAL HIGH (ref 0.61–1.24)
GFR, Est AFR Am: 31 mL/min — ABNORMAL LOW (ref >60)
GFR, Estimated: 26 mL/min — ABNORMAL LOW (ref >60)
Glucose, Bld: 197 mg/dL — ABNORMAL HIGH (ref 70–99)
Potassium: 5 mmol/L (ref 3.5–5.1)
Sodium: 141 mmol/L (ref 135–145)
Total Bilirubin: 2.5 mg/dL — ABNORMAL HIGH (ref 0.3–1.2)
Total Protein: 6.3 g/dL — ABNORMAL LOW (ref 6.5–8.1)

## 2020-05-30 LAB — SAMPLE TO BLOOD BANK

## 2020-05-30 MED ORDER — EPOETIN ALFA-EPBX 40000 UNIT/ML IJ SOLN
INTRAMUSCULAR | Status: AC
  Filled 2020-05-30: qty 1

## 2020-05-30 MED ORDER — EPOETIN ALFA-EPBX 40000 UNIT/ML IJ SOLN
40000.0000 [IU] | Freq: Once | INTRAMUSCULAR | Status: AC
  Administered 2020-05-30: 40000 [IU] via SUBCUTANEOUS

## 2020-05-30 NOTE — Patient Instructions (Signed)

## 2020-05-30 NOTE — Progress Notes (Signed)
Pharmacist Chemotherapy Monitoring - Initial Assessment    Anticipated start date: 06/06/20   Regimen:   Are orders appropriate based on the patients diagnosis, regimen, and cycle? Yes  Does the plan date match the patients scheduled date? Yes  Is the sequencing of drugs appropriate? Yes  Are the premedications appropriate for the patients regimen? Yes  Prior Authorization for treatment is: Not Started o If applicable, is the correct biosimilar selected based on the patient's insurance? not applicable  Organ Function and Labs:  Are dose adjustments needed based on the patient's renal function, hepatic function, or hematologic function? No  Are appropriate labs ordered prior to the start of patient's treatment? Yes  Other organ system assessment, if indicated: N/A  The following baseline labs, if indicated, have been ordered: N/A  Dose Assessment:  Are the drug doses appropriate? Yes  Are the following correct: o Drug concentrations Yes o IV fluid compatible with drug Yes o Administration routes Yes o Timing of therapy Yes  If applicable, does the patient have documented access for treatment and/or plans for port-a-cath placement? not applicable  If applicable, have lifetime cumulative doses been properly documented and assessed? not applicable Lifetime Dose Tracking  No doses have been documented on this patient for the following tracked chemicals: Doxorubicin, Epirubicin, Idarubicin, Daunorubicin, Mitoxantrone, Bleomycin, Oxaliplatin, Carboplatin, Liposomal Doxorubicin  o   Toxicity Monitoring/Prevention:  The patient has the following take home antiemetics prescribed: N/A  The patient has the following take home medications prescribed: N/A  Medication allergies and previous infusion related reactions, if applicable, have been reviewed and addressed. No  The patient's current medication list has been assessed for drug-drug interactions with their chemotherapy  regimen. no significant drug-drug interactions were identified on review.  Order Review:  Are the treatment plan orders signed? Yes  Is the patient scheduled to see a provider prior to their treatment? No  I verify that I have reviewed each item in the above checklist and answered each question accordingly.  Darron Stuck, Jacqlyn Larsen 05/30/2020 8:21 AM

## 2020-06-02 NOTE — Progress Notes (Signed)
Cardiology Office Note:    Date:  06/03/2020   ID:  Eric [name], DOB [birth year], MRN [id number]  PCP:  Forrestine Him, PA-C  Cardiologist:  Shirlee More, MD   Referring MD: Forrestine Him, PA-C  ASSESSMENT:    1. Permanent atrial fibrillation ([place])   2. Hypertensive heart disease with chronic diastolic congestive heart failure ([place])   3. DIASTOLIC HEART FAILURE, CHRONIC   4. CKD (chronic kidney disease) stage 4, GFR 15-29 ml/min ([name])   5. Low grade myelodysplastic syndrome lesions ([name])   6. Refractory anemia with sideroblasts ([name])   7. Nonrheumatic aortic valve stenosis    PLAN:    In order of problems listed above:  1. His atrial fibrillation is relatively slow and clinically his calcium channel blocker is contributing to anasarca he will discontinue continue anticoagulant. 2. BP at target he has decompensated heart failure with anasarca and his phenotype is a low BNP.  Regardless of his stage IV CKD we need to increase his diuretic will go to torsemide twice daily and if he does not respond we will need to start him on metolazone.  He also takes an alpha-blocker that potentiates edema but he is on it because of urinary symptoms and hematuria and is hesitant to stop at this time.  If refractory could need admission to the hospital for control diuresis with continuous IV diuretic. 3. Clearly his myelodysplasia is making everything worse with frequent transfusions and will ask his hematologist to give him IV Lasix 40 mg with transfusions in the future.  Next appointment 2 to 3 weeks with me or Dr. Harriet Masson   Medication Adjustments/Labs and Tests Ordered: Current medicines are reviewed at length with the patient today.  Concerns regarding medicines are outlined above.  Orders Placed This Encounter  Procedures  . EKG 12-Lead   Meds ordered this encounter  Medications  . DISCONTD: torsemide (DEMADEX) 20 MG tablet    Sig: Take 1 tablet (20 mg total) by mouth in the morning and at  bedtime.    Dispense:  60 tablet    Refill:  3  . torsemide (DEMADEX) 20 MG tablet    Sig: Take 1 tablet (20 mg total) by mouth in the morning and at bedtime.    Dispense:  180 tablet    Refill:  3     Chief Complaint  Patient presents with  . Leg Swelling    History of Present Illness:    Eric [name] is a 79 y.o. male with a history of of atrial fibrillation hypertension diabetes mellitus and low-grade myelodysplastic process with symptomatic anemia requiring transfusion referred for evaluation at the request of Forrestine Him, PA-C.  He had COVID-19 in November 2020  He is followed by [facility] nephrology last seen 01/31/2020 with type 2 diabetes stage IIIb chronic kidney disease and hypertension with heart failure.  He is also followed by cardiology [facility] last seen 12/18/2019 mild aortic stenosis chronic atrial fibrillation anticoagulated and hypertensive heart disease.  There is also notation that he had mild nonobstructive CAD.  At that visit physical examination states he has no edema.  He takes a rate limiting calcium channel blocker diltiazem for rate control  He was seen by Dr. Caryl Comes in 2011 with paroxysmal atrial fibrillation on Tikosyn with anticoagulation as well as hypertensive heart disease and diastolic heart failure.  He was [facility] ED 0 [phone] with worsened edema and heart failure.  Predominant problem was felt to  be anemia, his BNP level was mildly elevated 145 and he was referred for transfusion.  He had an echocardiogram performed 02/13/2020 that shows ejection fraction 60 to 65% normal right ventricular function severe left atrial enlargement moderate right atrial enlargement mild aortic stenosis small pericardial effusion and the patient was noted to be in atrial fibrillation.  Venous duplex right lower extremity 02/15/2020 showed no evidence of deep vein thrombosis.  EKG 04/19/2020 independently reviewed shows atrial  fibrillation controlled ventricular rate.  Recent labs performed 05/30/2020 creatinine of 2.27 GFR stage IV 26 cc/min potassium 3.7 and abnormal liver function with bilirubin of 2.5.  Was 8.3 platelet count diminished at 6800 leukopenia with a white count of 3400.  X-ray chest 04/19/2020 showed cardiomegaly and findings of heart failure.  I reviewed the history with the patient.  In the last months he has had a weight gain of over 20 pounds has developed intense edema shortness of breath even with ADLs assisted with dressing and bathing and has had orthopnea and PND.  At times he is taken extra dose of diuretic.  His anemia has worsened he has been transfused weekly.  No chest pain palpitation or syncope no bleeding.  He seeks a new specialist and cardiology.  He is sodium restricts is compliant with his medications and at times he takes an extra dose of diuretic and will have weight loss for few days. Past Medical History:  Diagnosis Date  . Anemia   . Bilateral lower extremity edema   . Chronic atrial fibrillation ([place])   . Diastolic heart failure ([place])   . Gall bladder disease 2020  . Goals of care, counseling/discussion 05/29/2020  . History of right hip replacement    Pt stated 8-9 yrs ago  . Low grade myelodysplastic syndrome lesions ([place]) 05/29/2020  . Refractory anemia with sideroblasts ([place]) 05/29/2020    Past Surgical History:  Procedure Laterality Date  . APPENDECTOMY    . CHOLECYSTECTOMY    . TOTAL HIP ARTHROPLASTY      Current Medications: Current Meds  Medication Sig  . acetaminophen (TYLENOL) 325 MG tablet Take 650 mg by mouth every 6 (six) hours as needed for moderate pain.   Marland Kitchen allopurinol (ZYLOPRIM) 100 MG tablet Take 100 mg by mouth daily.  Marland Kitchen diltiazem (TIAZAC) 360 MG 24 hr capsule Take 360 mg by mouth daily.  Marland Kitchen ELIQUIS 5 MG TABS tablet Take 5 mg by mouth 2 (two) times daily. Taking once daily  . insulin glargine, 1 Unit Dial, (TOUJEO SOLOSTAR) 300 UNIT/ML Solostar Pen  Inject 46 Units into the skin daily.   . insulin lispro (HUMALOG) 100 UNIT/ML KwikPen Inject 20 Units into the skin 3 (three) times daily.   Marland Kitchen levothyroxine (SYNTHROID) 100 MCG tablet Take 50-100 mcg by mouth See admin instructions. Takes 100 mg daily except Sundays, on Sunday takes 50 mg  . silodosin (RAPAFLO) 8 MG CAPS capsule Take 8 mg by mouth daily.   . simvastatin (ZOCOR) 20 MG tablet Take 20 mg by mouth at bedtime.  Marland Kitchen spironolactone (ALDACTONE) 50 MG tablet Take 50 mg by mouth daily.  Marland Kitchen torsemide (DEMADEX) 20 MG tablet Take 1 tablet (20 mg total) by mouth in the morning and at bedtime.  . [DISCONTINUED] torsemide (DEMADEX) 20 MG tablet Take 20 mg by mouth in the morning and at bedtime.  . [DISCONTINUED] torsemide (DEMADEX) 20 MG tablet Take 1 tablet (20 mg total) by mouth in the morning and at bedtime.     Allergies:  Iohexol, Morphine and related, Hydrocodone, Sulfa antibiotics, Sulfonamide derivatives, and Trazodone and nefazodone   Social History   Socioeconomic History  . Marital status: Married    Spouse name: Not on file  . Number of children: Not on file  . Years of education: Not on file  . Highest education level: Not on file  Occupational History  . Not on file  Tobacco Use  . Smoking status: Never Smoker  . Smokeless tobacco: Never Used  Vaping Use  . Vaping Use: Never used  Substance and Sexual Activity  . Alcohol use: Never  . Drug use: Never  . Sexual activity: Yes    Partners: Female  Other Topics Concern  . Not on file  Social History Narrative  . Not on file   Social Determinants of Health   Financial Resource Strain:   . Difficulty of Paying Living Expenses: Not on file  Food Insecurity:   . Worried About Charity fundraiser in the Last Year: Not on file  . Ran Out of Food in the Last Year: Not on file  Transportation Needs:   . Lack of Transportation (Medical): Not on file  . Lack of Transportation (Non-Medical): Not on file  Physical  Activity:   . Days of Exercise per Week: Not on file  . Minutes of Exercise per Session: Not on file  Stress:   . Feeling of Stress : Not on file  Social Connections:   . Frequency of Communication with Friends and Family: Not on file  . Frequency of Social Gatherings with Friends and Family: Not on file  . Attends Religious Services: Not on file  . Active Member of Clubs or Organizations: Not on file  . Attends Archivist Meetings: Not on file  . Marital Status: Not on file     Family History: The patient's family history is not on file.  ROS:   Review of Systems  Constitutional: Positive for malaise/fatigue and weight gain.  HENT: Negative.   Eyes: Negative.   Cardiovascular: Positive for dyspnea on exertion, orthopnea and paroxysmal nocturnal dyspnea.  Respiratory: Positive for shortness of breath.   Endocrine: Negative.   Hematologic/Lymphatic: Negative.   Skin: Positive for color change.  Musculoskeletal: Positive for muscle weakness.  Gastrointestinal: Negative.   Genitourinary: Negative.   Neurological: Positive for weakness.  Psychiatric/Behavioral: Negative.   Allergic/Immunologic: Negative.    Please see the history of present illness.     All other systems reviewed and are negative.  EKGs/Labs/Other Studies Reviewed:    The following studies were reviewed today:   EKG:  EKG is  ordered today.  The ekg ordered today is personally reviewed and demonstrates atrial fibrillation relatively slow rate 63 bpm right axis deviation  Recent Labs: 04/19/2020: B Natriuretic Peptide 145.1 05/30/2020: ALT 7; BUN 65; Creatinine 2.27; Hemoglobin 8.3; Platelet Count 68; Potassium 5.0; Sodium 141  Recent Lipid Panel No results found for: CHOL, TRIG, HDL, CHOLHDL, VLDL, LDLCALC, LDLDIRECT  Physical Exam:    VS:  BP (!) 143/66   Pulse 63   Ht '5\' 11"'  (1.803 m)   Wt 265 lb 1.9 oz (120.3 kg)   SpO2 99%   BMI 36.98 kg/m     Wt Readings from Last 3 Encounters:    06/03/20 265 lb 1.9 oz (120.3 kg)  05/23/20 262 lb (118.8 kg)  05/09/20 264 lb (119.7 kg)     GEN: He looks chronically ill pallor of the skin of membranes and has anasarca  well nourished, well developed in no acute distress HEENT: Normal NECK: Moderate JVD; No carotid bruits LYMPHATICS: No lymphadenopathy CARDIAC: Irregular S1 variable 1/6 to 2/6 aortic outflow murmur peaks early no radiation to the carotids S2 normal  RESPIRATORY:  Clear to auscultation without rales, wheezing or rhonchi  ABDOMEN: Soft, non-tender, non-distended MUSCULOSKELETAL: He has generalized marked pitting edema; No deformity  SKIN: Warm and dry NEUROLOGIC:  Alert and oriented x 3 PSYCHIATRIC:  Normal affect     Signed, Shirlee More, MD  06/03/2020 10:40 AM    [place]

## 2020-06-03 ENCOUNTER — Ambulatory Visit (INDEPENDENT_AMBULATORY_CARE_PROVIDER_SITE_OTHER): Payer: Medicare Other | Admitting: Cardiology

## 2020-06-03 ENCOUNTER — Encounter: Payer: Self-pay | Admitting: Cardiology

## 2020-06-03 ENCOUNTER — Other Ambulatory Visit: Payer: Self-pay

## 2020-06-03 VITALS — BP 143/66 | HR 63 | Ht 71.0 in | Wt 265.1 lb

## 2020-06-03 DIAGNOSIS — I4821 Permanent atrial fibrillation: Secondary | ICD-10-CM | POA: Diagnosis not present

## 2020-06-03 DIAGNOSIS — N184 Chronic kidney disease, stage 4 (severe): Secondary | ICD-10-CM

## 2020-06-03 DIAGNOSIS — I110 Hypertensive heart disease with heart failure: Secondary | ICD-10-CM

## 2020-06-03 DIAGNOSIS — D461 Refractory anemia with ring sideroblasts: Secondary | ICD-10-CM

## 2020-06-03 DIAGNOSIS — I5032 Chronic diastolic (congestive) heart failure: Secondary | ICD-10-CM | POA: Diagnosis not present

## 2020-06-03 DIAGNOSIS — D4620 Refractory anemia with excess of blasts, unspecified: Secondary | ICD-10-CM

## 2020-06-03 DIAGNOSIS — I350 Nonrheumatic aortic (valve) stenosis: Secondary | ICD-10-CM

## 2020-06-03 MED ORDER — TORSEMIDE 20 MG PO TABS: 20.0000 mg | ORAL_TABLET | Freq: Two times a day (BID) | ORAL | 3 refills | Status: DC

## 2020-06-03 MED ORDER — TORSEMIDE 20 MG PO TABS
20.0000 mg | ORAL_TABLET | Freq: Two times a day (BID) | ORAL | 3 refills | Status: DC
Start: 2020-06-03 — End: 2021-09-18

## 2020-06-03 NOTE — Patient Instructions (Signed)
Medication Instructions:  Your physician has recommended you make the following change in your medication:  STOP: Diltiazem INCREASE: Torsemide 20 mg take one tablet by mouth twice daily.  *If you need a refill on your cardiac medications before your next appointment, please call your pharmacy*   Lab Work: None If you have labs (blood work) drawn today and your tests are completely normal, you will receive your results only by: Marland Kitchen MyChart Message (if you have MyChart) OR . A paper copy in the mail If you have any lab test that is abnormal or we need to change your treatment, we will call you to review the results.   Testing/Procedures: None   Follow-Up: At [facility], you and your health needs are our priority.  As part of our continuing mission to provide you with exceptional heart care, we have created designated Provider Care Teams.  These Care Teams include your primary Cardiologist (physician) and Advanced Practice Providers (APPs -  Physician Assistants and Nurse Practitioners) who all work together to provide you with the care you need, when you need it.  We recommend signing up for the patient portal called "MyChart".  Sign up information is provided on this After Visit Summary.  MyChart is used to connect with patients for Virtual Visits (Telemedicine).  Patients are able to view lab/test results, encounter notes, upcoming appointments, etc.  Non-urgent messages can be sent to your provider as well.   To learn more about what you can do with MyChart, go to [email or web address].    Your next appointment:   3 week(s)  The format for your next appointment:   In Person  Provider:   Shirlee More, MD or Berniece Salines, MD   Other Instructions

## 2020-06-04 ENCOUNTER — Other Ambulatory Visit: Payer: Self-pay | Admitting: Family

## 2020-06-06 ENCOUNTER — Other Ambulatory Visit: Payer: Self-pay

## 2020-06-06 ENCOUNTER — Inpatient Hospital Stay: Payer: Medicare Other

## 2020-06-06 VITALS — BP 147/62 | HR 72 | Temp 97.9°F | Resp 17

## 2020-06-06 DIAGNOSIS — D649 Anemia, unspecified: Secondary | ICD-10-CM

## 2020-06-06 DIAGNOSIS — D518 Other vitamin B12 deficiency anemias: Secondary | ICD-10-CM

## 2020-06-06 LAB — CBC WITH DIFFERENTIAL (CANCER CENTER ONLY)
Abs Immature Granulocytes: 0.01 10*3/uL (ref 0.00–0.07)
Basophils Absolute: 0 10*3/uL (ref 0.0–0.1)
Basophils Relative: 1 %
Eosinophils Absolute: 0.1 10*3/uL (ref 0.0–0.5)
Eosinophils Relative: 3 %
HCT: 26.5 % — ABNORMAL LOW (ref 39.0–52.0)
Hemoglobin: 8.8 g/dL — ABNORMAL LOW (ref 13.0–17.0)
Immature Granulocytes: 0 %
Lymphocytes Relative: 22 %
Lymphs Abs: 0.7 10*3/uL (ref 0.7–4.0)
MCH: 32 pg (ref 26.0–34.0)
MCHC: 33.2 g/dL (ref 30.0–36.0)
MCV: 96.4 fL (ref 80.0–100.0)
Monocytes Absolute: 0.3 10*3/uL (ref 0.1–1.0)
Monocytes Relative: 10 %
Neutro Abs: 2.1 10*3/uL (ref 1.7–7.7)
Neutrophils Relative %: 64 %
Platelet Count: 65 10*3/uL — ABNORMAL LOW (ref 150–400)
RBC: 2.75 MIL/uL — ABNORMAL LOW (ref 4.22–5.81)
RDW: 16.4 % — ABNORMAL HIGH (ref 11.5–15.5)
WBC Count: 3.2 10*3/uL — ABNORMAL LOW (ref 4.0–10.5)
nRBC: 0 % (ref 0.0–0.2)

## 2020-06-06 LAB — CMP (CANCER CENTER ONLY)
ALT: 9 U/L (ref 0–44)
AST: 15 U/L (ref 15–41)
Albumin: 4.1 g/dL (ref 3.5–5.0)
Alkaline Phosphatase: 93 U/L (ref 38–126)
Anion gap: 7 (ref 5–15)
BUN: 54 mg/dL — ABNORMAL HIGH (ref 8–23)
CO2: 29 mmol/L (ref 22–32)
Calcium: 9.7 mg/dL (ref 8.9–10.3)
Chloride: 106 mmol/L (ref 98–111)
Creatinine: 2.1 mg/dL — ABNORMAL HIGH (ref 0.61–1.24)
GFR, Est AFR Am: 34 mL/min — ABNORMAL LOW (ref >60)
GFR, Estimated: 29 mL/min — ABNORMAL LOW (ref >60)
Glucose, Bld: 82 mg/dL (ref 70–99)
Potassium: 4.7 mmol/L (ref 3.5–5.1)
Sodium: 142 mmol/L (ref 135–145)
Total Bilirubin: 3 mg/dL — ABNORMAL HIGH (ref 0.3–1.2)
Total Protein: 6.7 g/dL (ref 6.5–8.1)

## 2020-06-06 LAB — SAMPLE TO BLOOD BANK

## 2020-06-06 MED ORDER — EPOETIN ALFA-EPBX 40000 UNIT/ML IJ SOLN
INTRAMUSCULAR | Status: AC
  Filled 2020-06-06: qty 1

## 2020-06-06 MED ORDER — EPOETIN ALFA-EPBX 40000 UNIT/ML IJ SOLN
40000.0000 [IU] | Freq: Once | INTRAMUSCULAR | Status: AC
  Administered 2020-06-06: 40000 [IU] via SUBCUTANEOUS

## 2020-06-06 NOTE — Patient Instructions (Signed)

## 2020-06-06 NOTE — Progress Notes (Signed)
No need for blood transfusion per Dr Marin Olp. Retacrit only. Patient aware tomorrow's appt canceled.  dph

## 2020-06-13 ENCOUNTER — Inpatient Hospital Stay: Payer: Medicare Other

## 2020-06-13 ENCOUNTER — Other Ambulatory Visit: Payer: Self-pay

## 2020-06-13 VITALS — BP 127/60 | HR 71 | Temp 97.7°F | Resp 18

## 2020-06-13 DIAGNOSIS — D649 Anemia, unspecified: Secondary | ICD-10-CM

## 2020-06-13 DIAGNOSIS — D518 Other vitamin B12 deficiency anemias: Secondary | ICD-10-CM

## 2020-06-13 LAB — CBC WITH DIFFERENTIAL (CANCER CENTER ONLY)
Abs Immature Granulocytes: 0.02 10*3/uL (ref 0.00–0.07)
Basophils Absolute: 0 10*3/uL (ref 0.0–0.1)
Basophils Relative: 1 %
Eosinophils Absolute: 0.1 10*3/uL (ref 0.0–0.5)
Eosinophils Relative: 4 %
HCT: 27 % — ABNORMAL LOW (ref 39.0–52.0)
Hemoglobin: 8.8 g/dL — ABNORMAL LOW (ref 13.0–17.0)
Immature Granulocytes: 1 %
Lymphocytes Relative: 25 %
Lymphs Abs: 0.8 10*3/uL (ref 0.7–4.0)
MCH: 31.5 pg (ref 26.0–34.0)
MCHC: 32.6 g/dL (ref 30.0–36.0)
MCV: 96.8 fL (ref 80.0–100.0)
Monocytes Absolute: 0.3 10*3/uL (ref 0.1–1.0)
Monocytes Relative: 10 %
Neutro Abs: 1.9 10*3/uL (ref 1.7–7.7)
Neutrophils Relative %: 59 %
Platelet Count: 60 10*3/uL — ABNORMAL LOW (ref 150–400)
RBC: 2.79 MIL/uL — ABNORMAL LOW (ref 4.22–5.81)
RDW: 15.6 % — ABNORMAL HIGH (ref 11.5–15.5)
WBC Count: 3.1 10*3/uL — ABNORMAL LOW (ref 4.0–10.5)
nRBC: 0 % (ref 0.0–0.2)

## 2020-06-13 LAB — SAMPLE TO BLOOD BANK

## 2020-06-13 MED ORDER — EPOETIN ALFA-EPBX 40000 UNIT/ML IJ SOLN
40000.0000 [IU] | Freq: Once | INTRAMUSCULAR | Status: AC
  Administered 2020-06-13: 40000 [IU] via SUBCUTANEOUS

## 2020-06-13 MED ORDER — EPOETIN ALFA-EPBX 40000 UNIT/ML IJ SOLN
INTRAMUSCULAR | Status: AC
  Filled 2020-06-13: qty 1

## 2020-06-13 NOTE — Patient Instructions (Signed)

## 2020-06-20 ENCOUNTER — Other Ambulatory Visit: Payer: Self-pay

## 2020-06-20 ENCOUNTER — Inpatient Hospital Stay: Payer: Medicare Other

## 2020-06-20 VITALS — BP 119/75 | HR 61 | Temp 97.7°F | Resp 18

## 2020-06-20 DIAGNOSIS — D649 Anemia, unspecified: Secondary | ICD-10-CM | POA: Diagnosis not present

## 2020-06-20 DIAGNOSIS — D518 Other vitamin B12 deficiency anemias: Secondary | ICD-10-CM

## 2020-06-20 LAB — CMP (CANCER CENTER ONLY)
ALT: 8 U/L (ref 0–44)
AST: 13 U/L — ABNORMAL LOW (ref 15–41)
Albumin: 4.2 g/dL (ref 3.5–5.0)
Alkaline Phosphatase: 84 U/L (ref 38–126)
Anion gap: 5 (ref 5–15)
BUN: 48 mg/dL — ABNORMAL HIGH (ref 8–23)
CO2: 31 mmol/L (ref 22–32)
Calcium: 10 mg/dL (ref 8.9–10.3)
Chloride: 105 mmol/L (ref 98–111)
Creatinine: 1.83 mg/dL — ABNORMAL HIGH (ref 0.61–1.24)
GFR, Est AFR Am: 40 mL/min — ABNORMAL LOW (ref >60)
GFR, Estimated: 34 mL/min — ABNORMAL LOW (ref >60)
Glucose, Bld: 78 mg/dL (ref 70–99)
Potassium: 3.8 mmol/L (ref 3.5–5.1)
Sodium: 141 mmol/L (ref 135–145)
Total Bilirubin: 3.1 mg/dL — ABNORMAL HIGH (ref 0.3–1.2)
Total Protein: 7 g/dL (ref 6.5–8.1)

## 2020-06-20 LAB — CBC WITH DIFFERENTIAL (CANCER CENTER ONLY)
Abs Immature Granulocytes: 0.02 10*3/uL (ref 0.00–0.07)
Basophils Absolute: 0 10*3/uL (ref 0.0–0.1)
Basophils Relative: 1 %
Eosinophils Absolute: 0.1 10*3/uL (ref 0.0–0.5)
Eosinophils Relative: 3 %
HCT: 27.2 % — ABNORMAL LOW (ref 39.0–52.0)
Hemoglobin: 8.9 g/dL — ABNORMAL LOW (ref 13.0–17.0)
Immature Granulocytes: 1 %
Lymphocytes Relative: 34 %
Lymphs Abs: 1.1 10*3/uL (ref 0.7–4.0)
MCH: 31.3 pg (ref 26.0–34.0)
MCHC: 32.7 g/dL (ref 30.0–36.0)
MCV: 95.8 fL (ref 80.0–100.0)
Monocytes Absolute: 0.4 10*3/uL (ref 0.1–1.0)
Monocytes Relative: 12 %
Neutro Abs: 1.7 10*3/uL (ref 1.7–7.7)
Neutrophils Relative %: 49 %
Platelet Count: 72 10*3/uL — ABNORMAL LOW (ref 150–400)
RBC: 2.84 MIL/uL — ABNORMAL LOW (ref 4.22–5.81)
RDW: 15.6 % — ABNORMAL HIGH (ref 11.5–15.5)
WBC Count: 3.4 10*3/uL — ABNORMAL LOW (ref 4.0–10.5)
nRBC: 0 % (ref 0.0–0.2)

## 2020-06-20 MED ORDER — EPOETIN ALFA-EPBX 40000 UNIT/ML IJ SOLN
40000.0000 [IU] | Freq: Once | INTRAMUSCULAR | Status: AC
  Administered 2020-06-20: 40000 [IU] via SUBCUTANEOUS

## 2020-06-20 NOTE — Patient Instructions (Signed)

## 2020-06-25 ENCOUNTER — Ambulatory Visit (INDEPENDENT_AMBULATORY_CARE_PROVIDER_SITE_OTHER): Payer: Medicare Other | Admitting: Cardiology

## 2020-06-25 ENCOUNTER — Encounter: Payer: Self-pay | Admitting: Cardiology

## 2020-06-25 ENCOUNTER — Other Ambulatory Visit: Payer: Self-pay

## 2020-06-25 VITALS — BP 128/78 | HR 75 | Ht 71.0 in | Wt 235.0 lb

## 2020-06-25 DIAGNOSIS — I4819 Other persistent atrial fibrillation: Secondary | ICD-10-CM

## 2020-06-25 DIAGNOSIS — I10 Essential (primary) hypertension: Secondary | ICD-10-CM | POA: Diagnosis not present

## 2020-06-25 DIAGNOSIS — E669 Obesity, unspecified: Secondary | ICD-10-CM | POA: Diagnosis not present

## 2020-06-25 DIAGNOSIS — I5032 Chronic diastolic (congestive) heart failure: Secondary | ICD-10-CM

## 2020-06-25 NOTE — Patient Instructions (Signed)
Medication Instructions:  Your physician recommends that you continue on your current medications as directed. Please refer to the Current Medication list given to you today.  *If you need a refill on your cardiac medications before your next appointment, please call your pharmacy*   Lab Work: None If you have labs (blood work) drawn today and your tests are completely normal, you will receive your results only by: Marland Kitchen MyChart Message (if you have MyChart) OR . A paper copy in the mail If you have any lab test that is abnormal or we need to change your treatment, we will call you to review the results.   Testing/Procedures: None   Follow-Up: At [facility], you and your health needs are our priority.  As part of our continuing mission to provide you with exceptional heart care, we have created designated Provider Care Teams.  These Care Teams include your primary Cardiologist (physician) and Advanced Practice Providers (APPs -  Physician Assistants and Nurse Practitioners) who all work together to provide you with the care you need, when you need it.  We recommend signing up for the patient portal called "MyChart".  Sign up information is provided on this After Visit Summary.  MyChart is used to connect with patients for Virtual Visits (Telemedicine).  Patients are able to view lab/test results, encounter notes, upcoming appointments, etc.  Non-urgent messages can be sent to your provider as well.   To learn more about what you can do with MyChart, go to [email or web address].    Your next appointment:   2 month(s)  The format for your next appointment:   In Person  Provider:   Shirlee More, MD   Other Instructions

## 2020-06-25 NOTE — Progress Notes (Signed)
Cardiology Office Note:    Date:  06/25/2020   ID:  Eric [name], DOB 28-May-1941, MRN [id number]  PCP:  Forrestine Him, PA-C  Cardiologist:  No primary care provider on file.  Electrophysiologist:  None   Referring MD: Forrestine Him, PA-C     History of Present Illness:    Eric [name] is a 79 y.o. 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 Patient did see Dr. Bettina Gavia on June 03, 2020 at that time his calcium channel blocker was stopped due to decompensated heart failure and stage IV CKD his torsemide was increased to twice daily.  He also tells me that he has gotten transfusion with his hematologist and he has been doing well.  His leg edema has improved immensely he tells me since his visit with Dr. Bettina Gavia he has lost close to 30 pounds.  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    allopurinol (ZYLOPRIM) 100 MG tablet Take 100 mg by mouth daily.   ELIQUIS 5 MG TABS tablet Take 5 mg by mouth 2 (two) times daily. Taking once daily   insulin glargine, 1 Unit Dial, (TOUJEO SOLOSTAR) 300 UNIT/ML Solostar Pen Inject 46 Units into the skin daily.    insulin lispro (HUMALOG) 100 UNIT/ML KwikPen Inject  20 Units into the skin 3 (three) times daily.    levothyroxine (SYNTHROID) 100 MCG tablet Take 50-100 mcg by mouth See admin instructions. Takes 100 mg daily except Sundays, on Sunday takes 50 mg   silodosin (RAPAFLO) 8 MG CAPS capsule Take 8 mg by mouth daily.    simvastatin (ZOCOR) 20 MG tablet Take 20 mg by mouth at bedtime.   spironolactone (ALDACTONE) 50 MG tablet Take 50 mg by mouth daily.   torsemide (DEMADEX) 20 MG tablet Take 1 tablet (20 mg total) by mouth in the morning and at bedtime.     Allergies:   Iohexol, Morphine and related, Hydrocodone, Sulfa antibiotics, Sulfonamide derivatives, and Trazodone and nefazodone   Social History   Socioeconomic History   Marital status: Married    Spouse name: Not on file   Number of children: Not on file   Years of education: Not on file   Highest education level: Not on file  Occupational History   Not on file  Tobacco Use   Smoking status: Never Smoker   Smokeless tobacco: Never Used  Vaping Use   Vaping Use: Never used  Substance and Sexual Activity   Alcohol use: Never   Drug use: Never   Sexual activity: Yes    Partners: Female  Other Topics Concern   Not on file  Social History Narrative   Not on file   Social  Determinants of Health   Financial Resource Strain:    Difficulty of Paying Living Expenses: Not on file  Food Insecurity:    Worried About [place] in the Last Year: Not on file   Ran Out of Food in the Last Year: Not on file  Transportation Needs:    Lack of Transportation (Medical): Not on file   Lack of Transportation (Non-Medical): Not on file  Physical Activity:    Days of Exercise per Week: Not on file   Minutes of Exercise per Session: Not on file  Stress:    Feeling of Stress : Not on file  Social Connections:    Frequency of Communication with Friends and Family: Not on file   Frequency of Social Gatherings with Friends and Family: Not on file   Attends  Religious Services: Not on file   Active Member of Clubs or Organizations: Not on file   Attends Archivist Meetings: Not on file   Marital Status: Not on file     Family History: The patient's family history is not on file.  ROS:   Review of Systems  Constitution: Negative for decreased appetite, fever and weight gain.  HENT: Negative for congestion, ear discharge, hoarse voice and sore throat.   Eyes: Negative for discharge, redness, vision loss in right eye and visual halos.  Cardiovascular: Negative for chest pain, dyspnea on exertion, leg swelling, orthopnea and palpitations.  Respiratory: Negative for cough, hemoptysis, shortness of breath and snoring.   Endocrine: Negative for heat intolerance and polyphagia.  Hematologic/Lymphatic: Negative for bleeding problem. Does not bruise/bleed easily.  Skin: Negative for flushing, nail changes, rash and suspicious lesions.  Musculoskeletal: Negative for arthritis, joint pain, muscle cramps, myalgias, neck pain and stiffness.  Gastrointestinal: Negative for abdominal pain, bowel incontinence, diarrhea and excessive appetite.  Genitourinary: Negative for decreased libido, genital sores and incomplete emptying.  Neurological: Negative for brief paralysis, focal weakness, headaches and loss of balance.  Psychiatric/Behavioral: Negative for altered mental status, depression and suicidal ideas.  Allergic/Immunologic: Negative for HIV exposure and persistent infections.    EKGs/Labs/Other Studies Reviewed:    The following studies were reviewed today:   EKG: None today  Recent Labs: 04/19/2020: B Natriuretic Peptide 145.1 06/20/2020: ALT 8; BUN 48; Creatinine 1.83; Hemoglobin 8.9; Platelet Count 72; Potassium 3.8; Sodium 141  Recent Lipid Panel No results found for: CHOL, TRIG, HDL, CHOLHDL, VLDL, LDLCALC, LDLDIRECT  Physical Exam:    VS:  BP 128/78 (BP Location: Right Arm, Patient Position: Sitting, Cuff Size: Normal)     Pulse 75    Ht '5\' 11"'  (1.803 m)    Wt 235 lb (106.6 kg)    SpO2 98%    BMI 32.78 kg/m     Wt Readings from Last 3 Encounters:  06/25/20 235 lb (106.6 kg)  06/03/20 265 lb 1.9 oz (120.3 kg)  05/23/20 262 lb (118.8 kg)     GEN: Well nourished, well developed in no acute distress HEENT: Normal NECK: No JVD; No carotid bruits LYMPHATICS: No lymphadenopathy CARDIAC: S1S2 noted,RRR, no murmurs, rubs, gallops RESPIRATORY:  Clear to auscultation without rales, wheezing or rhonchi  ABDOMEN: Soft, non-tender, non-distended, +bowel sounds, no guarding. EXTREMITIES: No edema, No cyanosis, no clubbing MUSCULOSKELETAL:  No deformity  SKIN: Warm and dry NEUROLOGIC:  Alert and oriented x 3, non-focal PSYCHIATRIC:  Normal affect, good insight  ASSESSMENT:    1. Benign essential hypertension   2. Persistent atrial fibrillation ([place])   3. DIASTOLIC  HEART FAILURE, CHRONIC   4. Obesity (BMI 30-39.9)    PLAN:      The patient has responded tremendously to his increasing torsemide.  He is lost 30 pounds.  I am not going to change his medication regimen at this time as I believe that this is working.  He recently had blood work done which showed his creatinine is 1.83 which is an improvement compared to his previous blood works he follows with nephrology.  He tells me that get blood work on a weekly basis on Thursdays.  Therefore I am not going to have the patient get any blood work done today he gets a CBC as well as CMP.  He will follow Dr. Bettina Gavia in 2 months.  The patient is in agreement with the above plan. The patient left the office in stable condition.  The patient will follow up in   Medication Adjustments/Labs and Tests Ordered: Current medicines are reviewed at length with the patient today.  Concerns regarding medicines are outlined above.  No orders of the defined types were placed in this encounter.  No orders of the defined types were placed in this encounter.   Patient  Instructions  Medication Instructions:  Your physician recommends that you continue on your current medications as directed. Please refer to the Current Medication list given to you today.  *If you need a refill on your cardiac medications before your next appointment, please call your pharmacy*   Lab Work: None If you have labs (blood work) drawn today and your tests are completely normal, you will receive your results only by:  [place] (if you have MyChart) OR  A paper copy in the mail If you have any lab test that is abnormal or we need to change your treatment, we will call you to review the results.   Testing/Procedures: None   Follow-Up: At [facility], you and your health needs are our priority.  As part of our continuing mission to provide you with exceptional heart care, we have created designated Provider Care Teams.  These Care Teams include your primary Cardiologist (physician) and Advanced Practice Providers (APPs -  Physician Assistants and Nurse Practitioners) who all work together to provide you with the care you need, when you need it.  We recommend signing up for the patient portal called "MyChart".  Sign up information is provided on this After Visit Summary.  MyChart is used to connect with patients for Virtual Visits (Telemedicine).  Patients are able to view lab/test results, encounter notes, upcoming appointments, etc.  Non-urgent messages can be sent to your provider as well.   To learn more about what you can do with MyChart, go to [email or web address].    Your next appointment:   2 month(s)  The format for your next appointment:   In Person  Provider:   Shirlee More, MD   Other Instructions      Adopting a Healthy Lifestyle.  Know what a healthy weight is for you (roughly BMI <25) and aim to maintain this   Aim for 7+ servings of fruits and vegetables daily   65-80+ fluid ounces of water or unsweet tea for healthy kidneys     Limit to max 1 drink of alcohol per day; avoid smoking/tobacco   Limit animal fats in diet for cholesterol and heart health - choose grass fed whenever available   Avoid highly processed foods, and foods high in saturated/trans fats   Aim for low stress - take time  to unwind and care for your mental health   Aim for 150 min of moderate intensity exercise weekly for heart health, and weights twice weekly for bone health   Aim for 7-9 hours of sleep daily   When it comes to diets, agreement about the perfect plan isnt easy to find, even among the experts. Experts at the [employer] developed an idea known as the Healthy Eating Plate. Just imagine a plate divided into logical, healthy portions.   The emphasis is on diet quality:   Load up on vegetables and fruits - one-half of your plate: Aim for color and variety, and remember that potatoes dont count.   Go for whole grains - one-quarter of your plate: Whole wheat, barley, wheat berries, quinoa, oats, brown rice, and foods made with them. If you want pasta, go with whole wheat pasta.   Protein power - one-quarter of your plate: Fish, chicken, beans, and nuts are all healthy, versatile protein sources. Limit red meat.   The diet, however, does go beyond the plate, offering a few other suggestions.   Use healthy plant oils, such as olive, canola, soy, corn, sunflower and peanut. Check the labels, and avoid partially hydrogenated oil, which have unhealthy trans fats.   If youre thirsty, drink water. Coffee and tea are good in moderation, but skip sugary drinks and limit milk and dairy products to one or two daily servings.   The type of carbohydrate in the diet is more important than the amount. Some sources of carbohydrates, such as vegetables, fruits, whole grains, and beans-are healthier than others.   Finally, stay active  Signed, Berniece Salines, DO  06/25/2020 2:00 PM    [place]

## 2020-06-26 ENCOUNTER — Other Ambulatory Visit: Payer: Self-pay

## 2020-06-26 MED ORDER — SPIRONOLACTONE 50 MG PO TABS
50.0000 mg | ORAL_TABLET | Freq: Every day | ORAL | 1 refills | Status: DC
Start: 2020-06-26 — End: 2020-12-06

## 2020-06-26 MED ORDER — SIMVASTATIN 20 MG PO TABS
20.0000 mg | ORAL_TABLET | Freq: Every day | ORAL | 1 refills | Status: DC
Start: 2020-06-26 — End: 2020-12-06

## 2020-06-27 ENCOUNTER — Encounter: Payer: Self-pay | Admitting: Hematology & Oncology

## 2020-06-27 ENCOUNTER — Telehealth: Payer: Self-pay | Admitting: *Deleted

## 2020-06-27 ENCOUNTER — Inpatient Hospital Stay: Payer: Medicare Other | Attending: Hematology & Oncology

## 2020-06-27 ENCOUNTER — Other Ambulatory Visit: Payer: Self-pay

## 2020-06-27 ENCOUNTER — Inpatient Hospital Stay: Payer: Medicare Other

## 2020-06-27 ENCOUNTER — Inpatient Hospital Stay (HOSPITAL_BASED_OUTPATIENT_CLINIC_OR_DEPARTMENT_OTHER): Payer: Medicare Other | Admitting: Hematology & Oncology

## 2020-06-27 VITALS — BP 123/53 | HR 81 | Temp 97.6°F | Resp 19 | Wt 237.0 lb

## 2020-06-27 DIAGNOSIS — Z7901 Long term (current) use of anticoagulants: Secondary | ICD-10-CM | POA: Diagnosis not present

## 2020-06-27 DIAGNOSIS — D649 Anemia, unspecified: Secondary | ICD-10-CM

## 2020-06-27 DIAGNOSIS — R5383 Other fatigue: Secondary | ICD-10-CM | POA: Insufficient documentation

## 2020-06-27 DIAGNOSIS — D518 Other vitamin B12 deficiency anemias: Secondary | ICD-10-CM

## 2020-06-27 DIAGNOSIS — M7989 Other specified soft tissue disorders: Secondary | ICD-10-CM | POA: Insufficient documentation

## 2020-06-27 DIAGNOSIS — D461 Refractory anemia with ring sideroblasts: Secondary | ICD-10-CM

## 2020-06-27 DIAGNOSIS — D469 Myelodysplastic syndrome, unspecified: Secondary | ICD-10-CM | POA: Diagnosis present

## 2020-06-27 DIAGNOSIS — D4620 Refractory anemia with excess of blasts, unspecified: Secondary | ICD-10-CM

## 2020-06-27 DIAGNOSIS — Z79899 Other long term (current) drug therapy: Secondary | ICD-10-CM | POA: Insufficient documentation

## 2020-06-27 DIAGNOSIS — Z7189 Other specified counseling: Secondary | ICD-10-CM

## 2020-06-27 LAB — CMP (CANCER CENTER ONLY)
ALT: 6 U/L (ref 0–44)
AST: 13 U/L — ABNORMAL LOW (ref 15–41)
Albumin: 4.1 g/dL (ref 3.5–5.0)
Alkaline Phosphatase: 85 U/L (ref 38–126)
Anion gap: 8 (ref 5–15)
BUN: 50 mg/dL — ABNORMAL HIGH (ref 8–23)
CO2: 30 mmol/L (ref 22–32)
Calcium: 9.4 mg/dL (ref 8.9–10.3)
Chloride: 101 mmol/L (ref 98–111)
Creatinine: 1.9 mg/dL — ABNORMAL HIGH (ref 0.61–1.24)
GFR, Estimated: 33 mL/min — ABNORMAL LOW (ref >60)
Glucose, Bld: 177 mg/dL — ABNORMAL HIGH (ref 70–99)
Potassium: 4 mmol/L (ref 3.5–5.1)
Sodium: 139 mmol/L (ref 135–145)
Total Bilirubin: 3.9 mg/dL (ref 0.3–1.2)
Total Protein: 6.9 g/dL (ref 6.5–8.1)

## 2020-06-27 LAB — BILIRUBIN, DIRECT: Bilirubin, Direct: 0.4 mg/dL — ABNORMAL HIGH (ref 0.0–0.2)

## 2020-06-27 LAB — CBC WITH DIFFERENTIAL (CANCER CENTER ONLY)
Abs Immature Granulocytes: 0.06 10*3/uL (ref 0.00–0.07)
Basophils Absolute: 0 10*3/uL (ref 0.0–0.1)
Basophils Relative: 1 %
Eosinophils Absolute: 0.1 10*3/uL (ref 0.0–0.5)
Eosinophils Relative: 4 %
HCT: 26.9 % — ABNORMAL LOW (ref 39.0–52.0)
Hemoglobin: 9 g/dL — ABNORMAL LOW (ref 13.0–17.0)
Immature Granulocytes: 2 %
Lymphocytes Relative: 21 %
Lymphs Abs: 0.7 10*3/uL (ref 0.7–4.0)
MCH: 31.7 pg (ref 26.0–34.0)
MCHC: 33.5 g/dL (ref 30.0–36.0)
MCV: 94.7 fL (ref 80.0–100.0)
Monocytes Absolute: 0.3 10*3/uL (ref 0.1–1.0)
Monocytes Relative: 9 %
Neutro Abs: 2 10*3/uL (ref 1.7–7.7)
Neutrophils Relative %: 63 %
Platelet Count: 72 10*3/uL — ABNORMAL LOW (ref 150–400)
RBC: 2.84 MIL/uL — ABNORMAL LOW (ref 4.22–5.81)
RDW: 15.3 % (ref 11.5–15.5)
WBC Count: 3.1 10*3/uL — ABNORMAL LOW (ref 4.0–10.5)
nRBC: 0 % (ref 0.0–0.2)

## 2020-06-27 LAB — RETICULOCYTES
Immature Retic Fract: 13.6 % (ref 2.3–15.9)
RBC.: 2.77 MIL/uL — ABNORMAL LOW (ref 4.22–5.81)
Retic Count, Absolute: 109.7 10*3/uL (ref 19.0–186.0)
Retic Ct Pct: 4 % — ABNORMAL HIGH (ref 0.4–3.1)

## 2020-06-27 LAB — LACTATE DEHYDROGENASE: LDH: 285 U/L — ABNORMAL HIGH (ref 98–192)

## 2020-06-27 LAB — FERRITIN: Ferritin: 117 ng/mL (ref 24–336)

## 2020-06-27 LAB — IRON AND TIBC
Iron: 60 ug/dL (ref 42–163)
Saturation Ratios: 21 % (ref 20–55)
TIBC: 285 ug/dL (ref 202–409)
UIBC: 225 ug/dL (ref 117–376)

## 2020-06-27 LAB — SAMPLE TO BLOOD BANK

## 2020-06-27 LAB — SAVE SMEAR(SSMR), FOR PROVIDER SLIDE REVIEW

## 2020-06-27 MED ORDER — EPOETIN ALFA-EPBX 40000 UNIT/ML IJ SOLN
40000.0000 [IU] | Freq: Once | INTRAMUSCULAR | Status: AC
  Administered 2020-06-27: 40000 [IU] via SUBCUTANEOUS

## 2020-06-27 NOTE — Telephone Encounter (Signed)
Dr. Ennever notified of total bili-3.9.  No new orders received at this time.  

## 2020-06-27 NOTE — Progress Notes (Signed)
Hematology and Oncology Follow Up Visit  Eric [name] [id number] 09-13-1941 79 y.o. 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  He did go ahead and have the tumor biopsy done.  This is done on 05/10/2020.  The pathology report ([id number]) showed a hyper cellular marrow.  There was some slight increase in sideroblasts.  There were no myeloblasts.  There is no atypical lymphoid aggregates.  There is no monoclonal B-cell population.  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  He still is having issues with anemia.  His CBC today shows a white count of 4.6.  Hemoglobin 8.3.  Platelet count 75,000.  He probably would benefit from Luspatercept.  I think this might be able to help get his hemoglobin back up.  He really is not a great candidate for hypomethylating agents.  He does have some fatigue.  He probably does need to be transfused with 1 unit of blood.  There is no fever.  He has had no rashes.  He has had some slight leg swelling which is chronic.  He has had no change in bowel or bladder habits.  There is been no cough.  Has had no fever.  Overall, I would say that his his performance status is ECOG 1-2.  Medications:  Current Outpatient Medications:    acetaminophen (TYLENOL) 325 MG tablet, Take 650 mg by mouth every 6 (six) hours as needed for moderate pain. , Disp: , Rfl:    allopurinol (ZYLOPRIM) 100 MG tablet, Take 100 mg by mouth daily., Disp: , Rfl:    ELIQUIS 5 MG TABS tablet, Take 5 mg by mouth 2 (two) times daily. Taking once daily, Disp: , Rfl:    insulin glargine, 1 Unit Dial, (TOUJEO SOLOSTAR) 300 UNIT/ML Solostar Pen, Inject 19 Units into the skin daily. , Disp: , Rfl:    insulin lispro  (HUMALOG) 100 UNIT/ML KwikPen, Inject 20 Units into the skin 3 (three) times daily. , Disp: , Rfl:    levothyroxine (SYNTHROID) 100 MCG tablet, Take 50-100 mcg by mouth See admin instructions. Takes 100 mg daily except Sundays, on Sunday takes 50 mg, Disp: , Rfl:    silodosin (RAPAFLO) 8 MG CAPS capsule, Take 8 mg by mouth daily. , Disp: , Rfl:    simvastatin (ZOCOR) 20 MG tablet, Take 1 tablet (20 mg total) by mouth at bedtime., Disp: 90 tablet, Rfl: 1   spironolactone (ALDACTONE) 50 MG tablet, Take 1 tablet (50 mg total) by mouth daily., Disp: 90 tablet, Rfl: 1   torsemide (DEMADEX) 20 MG tablet, Take 1 tablet (20 mg total) by mouth in the morning and at bedtime., Disp: 180 tablet, Rfl: 3  Allergies:  Allergies  Allergen Reactions   Iohexol Other (See Comments)     Desc: "shuts kidneys down"    Morphine And Related Shortness Of Breath   Hydrocodone Nausea And Vomiting   Sulfa Antibiotics Other (See Comments)    UNKNOWN REACTION   Sulfonamide Derivatives Other (See Comments)    UNKNOWN REACTION   Trazodone And Nefazodone Rash    Pain    Past Medical History, Surgical history, Social history, and Family History were reviewed and updated.  Review of Systems:  Review of Systems  Constitutional: Positive for fatigue.  HENT:  Negative.   Eyes: Negative.   Respiratory: Negative.   Cardiovascular: Positive for leg swelling.  Gastrointestinal: Negative.   Endocrine: Negative.   Genitourinary: Negative.    Musculoskeletal: Negative.   Skin: Negative.   Neurological: Negative.   Hematological: Negative.   Psychiatric/Behavioral: Negative.     Physical Exam:  weight is 237 lb (107.5 kg). His oral temperature is 97.6 F (36.4 C). His blood pressure is 123/53 (abnormal) and his pulse is 81. His respiration is 19 and oxygen saturation is 99%.   Wt Readings from Last 3 Encounters:  06/27/20 237 lb (107.5 kg)  06/25/20 235 lb (106.6 kg)  06/03/20 265 lb 1.9 oz (120.3 kg)      Physical Exam Vitals reviewed.  HENT:     Head: Normocephalic and atraumatic.  Eyes:     Pupils: Pupils are equal, round, and reactive to light.  Cardiovascular:     Rate and Rhythm: Normal rate and regular rhythm.     Heart sounds: Normal heart sounds.  Pulmonary:     Effort: Pulmonary effort is normal.     Breath sounds: Normal breath sounds.  Abdominal:     General: Bowel sounds are normal.     Palpations: Abdomen is soft.  Musculoskeletal:        General: No tenderness or deformity. Normal range of motion.     Cervical back: Normal range of motion.  Lymphadenopathy:     Cervical: No cervical adenopathy.  Skin:    General: Skin is warm and dry.     Findings: No erythema or rash.  Neurological:     Mental Status: He is alert and oriented to person, place, and time.  Psychiatric:        Behavior: Behavior normal.        Thought Content: Thought content normal.        Judgment: Judgment normal.      Lab Results  Component Value Date   WBC 3.1 (L) 06/27/2020   HGB 9.0 (L) 06/27/2020   HCT 26.9 (L) 06/27/2020   MCV 94.7 06/27/2020   PLT 72 (L) 06/27/2020     Chemistry      Component Value Date/Time   NA 139 06/27/2020 1027   K 4.0 06/27/2020 1027   CL 101 06/27/2020 1027   CO2 30 06/27/2020 1027   BUN 50 (H) 06/27/2020 1027   CREATININE 1.90 (H) 06/27/2020 1027      Component Value Date/Time   CALCIUM 9.4 06/27/2020 1027   ALKPHOS 85 06/27/2020 1027   AST 13 (L) 06/27/2020 1027   ALT 6 06/27/2020 1027   BILITOT 3.9 (HH) 06/27/2020 1027      Impression and Plan: Eric [name] is a very nice 79 year old white male.  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  There is no blasts.  I would probably classify him as refractory anemia with ring sideroblasts.  As such, I think that Luspatercept would be a good idea for him.  I talked to he and his wife about this.  I explained what Luspatercept is.  I explained how it works.  I think he could tolerate  it easily.  It may take a few months before we start to see any response.  As such, he may need to be transfused intermittently.  He would be willing to try this.  I think Luspatercept would be quite reasonable.  For right now, we will  have to see about transfusing him.  I would like to try to get this before the holiday weekend.  We will have to see about the Luspatercept being set up.  Somehow had make sure he comes in weekly for his Retacrit injections.  We will have to watch his iron studies.  I may start him on some vitamin B6.  I spent about 40 minutes or so with he and his wife today.  I went over the bone marrow report.  I went over my recommendations.  They are in agreement.  We will have him come back to see [place] the second dose of Luspatercept.   Volanda Napoleon, MD 10/7/202111:27 AM

## 2020-06-28 ENCOUNTER — Other Ambulatory Visit: Payer: Self-pay

## 2020-06-28 MED ORDER — ELIQUIS 5 MG PO TABS
5.0000 mg | ORAL_TABLET | Freq: Two times a day (BID) | ORAL | 5 refills | Status: DC
Start: 2020-06-28 — End: 2021-02-10

## 2020-06-28 NOTE — Telephone Encounter (Signed)
Prescription refill request for Eliquis received. Indication:  Atrial Fibrillation Last office visit: 06/2020 Scr: 1.90 06/2020 Age: 79 Weight:107.5 kg  Prescription refilled

## 2020-07-03 ENCOUNTER — Other Ambulatory Visit: Payer: Self-pay | Admitting: *Deleted

## 2020-07-03 DIAGNOSIS — D461 Refractory anemia with ring sideroblasts: Secondary | ICD-10-CM

## 2020-07-03 DIAGNOSIS — D696 Thrombocytopenia, unspecified: Secondary | ICD-10-CM

## 2020-07-03 DIAGNOSIS — D4620 Refractory anemia with excess of blasts, unspecified: Secondary | ICD-10-CM

## 2020-07-03 DIAGNOSIS — D649 Anemia, unspecified: Secondary | ICD-10-CM

## 2020-07-04 ENCOUNTER — Telehealth: Payer: Self-pay | Admitting: *Deleted

## 2020-07-04 ENCOUNTER — Inpatient Hospital Stay: Payer: Medicare Other

## 2020-07-04 ENCOUNTER — Other Ambulatory Visit: Payer: Self-pay

## 2020-07-04 VITALS — BP 136/53 | HR 72 | Temp 97.7°F | Resp 19

## 2020-07-04 DIAGNOSIS — D696 Thrombocytopenia, unspecified: Secondary | ICD-10-CM

## 2020-07-04 DIAGNOSIS — D469 Myelodysplastic syndrome, unspecified: Secondary | ICD-10-CM | POA: Diagnosis not present

## 2020-07-04 DIAGNOSIS — D649 Anemia, unspecified: Secondary | ICD-10-CM

## 2020-07-04 DIAGNOSIS — D461 Refractory anemia with ring sideroblasts: Secondary | ICD-10-CM

## 2020-07-04 DIAGNOSIS — D4620 Refractory anemia with excess of blasts, unspecified: Secondary | ICD-10-CM

## 2020-07-04 DIAGNOSIS — D518 Other vitamin B12 deficiency anemias: Secondary | ICD-10-CM

## 2020-07-04 LAB — CBC WITH DIFFERENTIAL (CANCER CENTER ONLY)
Abs Immature Granulocytes: 0.01 10*3/uL (ref 0.00–0.07)
Basophils Absolute: 0 10*3/uL (ref 0.0–0.1)
Basophils Relative: 1 %
Eosinophils Absolute: 0.1 10*3/uL (ref 0.0–0.5)
Eosinophils Relative: 4 %
HCT: 28.9 % — ABNORMAL LOW (ref 39.0–52.0)
Hemoglobin: 9.4 g/dL — ABNORMAL LOW (ref 13.0–17.0)
Immature Granulocytes: 0 %
Lymphocytes Relative: 31 %
Lymphs Abs: 1 10*3/uL (ref 0.7–4.0)
MCH: 31.2 pg (ref 26.0–34.0)
MCHC: 32.5 g/dL (ref 30.0–36.0)
MCV: 96 fL (ref 80.0–100.0)
Monocytes Absolute: 0.4 10*3/uL (ref 0.1–1.0)
Monocytes Relative: 14 %
Neutro Abs: 1.5 10*3/uL — ABNORMAL LOW (ref 1.7–7.7)
Neutrophils Relative %: 50 %
Platelet Count: 83 10*3/uL — ABNORMAL LOW (ref 150–400)
RBC: 3.01 MIL/uL — ABNORMAL LOW (ref 4.22–5.81)
RDW: 15.4 % (ref 11.5–15.5)
WBC Count: 3.1 10*3/uL — ABNORMAL LOW (ref 4.0–10.5)
nRBC: 0 % (ref 0.0–0.2)

## 2020-07-04 LAB — CMP (CANCER CENTER ONLY)
ALT: 7 U/L (ref 0–44)
AST: 15 U/L (ref 15–41)
Albumin: 4.2 g/dL (ref 3.5–5.0)
Alkaline Phosphatase: 80 U/L (ref 38–126)
Anion gap: 7 (ref 5–15)
BUN: 54 mg/dL — ABNORMAL HIGH (ref 8–23)
CO2: 30 mmol/L (ref 22–32)
Calcium: 9.8 mg/dL (ref 8.9–10.3)
Chloride: 105 mmol/L (ref 98–111)
Creatinine: 1.8 mg/dL — ABNORMAL HIGH (ref 0.61–1.24)
GFR, Estimated: 35 mL/min — ABNORMAL LOW (ref >60)
Glucose, Bld: 84 mg/dL (ref 70–99)
Potassium: 4.3 mmol/L (ref 3.5–5.1)
Sodium: 142 mmol/L (ref 135–145)
Total Bilirubin: 3.8 mg/dL (ref 0.3–1.2)
Total Protein: 6.9 g/dL (ref 6.5–8.1)

## 2020-07-04 MED ORDER — EPOETIN ALFA-EPBX 40000 UNIT/ML IJ SOLN
INTRAMUSCULAR | Status: AC
  Filled 2020-07-04: qty 1

## 2020-07-04 MED ORDER — EPOETIN ALFA-EPBX 40000 UNIT/ML IJ SOLN
40000.0000 [IU] | Freq: Once | INTRAMUSCULAR | Status: AC
  Administered 2020-07-04: 40000 [IU] via SUBCUTANEOUS

## 2020-07-04 NOTE — Progress Notes (Signed)
Patient ambulatory and stable upon discharge 

## 2020-07-04 NOTE — Telephone Encounter (Signed)
Dr. Marin Olp notified of total bili-3.8 and ANC-1.5.  No new orders received.

## 2020-07-04 NOTE — Patient Instructions (Signed)

## 2020-07-10 ENCOUNTER — Encounter: Payer: Self-pay | Admitting: Hematology & Oncology

## 2020-07-10 ENCOUNTER — Other Ambulatory Visit: Payer: Self-pay | Admitting: *Deleted

## 2020-07-10 DIAGNOSIS — D649 Anemia, unspecified: Secondary | ICD-10-CM

## 2020-07-10 DIAGNOSIS — D4620 Refractory anemia with excess of blasts, unspecified: Secondary | ICD-10-CM

## 2020-07-10 DIAGNOSIS — D461 Refractory anemia with ring sideroblasts: Secondary | ICD-10-CM

## 2020-07-11 ENCOUNTER — Inpatient Hospital Stay: Payer: Medicare Other

## 2020-07-11 ENCOUNTER — Other Ambulatory Visit: Payer: Self-pay | Admitting: Family

## 2020-07-11 ENCOUNTER — Other Ambulatory Visit: Payer: Self-pay

## 2020-07-11 VITALS — BP 147/78 | HR 73 | Temp 98.0°F | Resp 16

## 2020-07-11 DIAGNOSIS — D4620 Refractory anemia with excess of blasts, unspecified: Secondary | ICD-10-CM

## 2020-07-11 DIAGNOSIS — D649 Anemia, unspecified: Secondary | ICD-10-CM

## 2020-07-11 DIAGNOSIS — D469 Myelodysplastic syndrome, unspecified: Secondary | ICD-10-CM | POA: Diagnosis not present

## 2020-07-11 DIAGNOSIS — D518 Other vitamin B12 deficiency anemias: Secondary | ICD-10-CM

## 2020-07-11 DIAGNOSIS — D461 Refractory anemia with ring sideroblasts: Secondary | ICD-10-CM

## 2020-07-11 LAB — CMP (CANCER CENTER ONLY)
ALT: 6 U/L (ref 0–44)
AST: 12 U/L — ABNORMAL LOW (ref 15–41)
Albumin: 4.2 g/dL (ref 3.5–5.0)
Alkaline Phosphatase: 92 U/L (ref 38–126)
Anion gap: 7 (ref 5–15)
BUN: 43 mg/dL — ABNORMAL HIGH (ref 8–23)
CO2: 30 mmol/L (ref 22–32)
Calcium: 10 mg/dL (ref 8.9–10.3)
Chloride: 103 mmol/L (ref 98–111)
Creatinine: 1.67 mg/dL — ABNORMAL HIGH (ref 0.61–1.24)
GFR, Estimated: 41 mL/min — ABNORMAL LOW (ref >60)
Glucose, Bld: 158 mg/dL — ABNORMAL HIGH (ref 70–99)
Potassium: 4.1 mmol/L (ref 3.5–5.1)
Sodium: 140 mmol/L (ref 135–145)
Total Bilirubin: 3.2 mg/dL — ABNORMAL HIGH (ref 0.3–1.2)
Total Protein: 7.3 g/dL (ref 6.5–8.1)

## 2020-07-11 LAB — CBC WITH DIFFERENTIAL (CANCER CENTER ONLY)
Abs Immature Granulocytes: 0.02 10*3/uL (ref 0.00–0.07)
Basophils Absolute: 0 10*3/uL (ref 0.0–0.1)
Basophils Relative: 1 %
Eosinophils Absolute: 0.1 10*3/uL (ref 0.0–0.5)
Eosinophils Relative: 4 %
HCT: 28.4 % — ABNORMAL LOW (ref 39.0–52.0)
Hemoglobin: 9.4 g/dL — ABNORMAL LOW (ref 13.0–17.0)
Immature Granulocytes: 1 %
Lymphocytes Relative: 25 %
Lymphs Abs: 1 10*3/uL (ref 0.7–4.0)
MCH: 31 pg (ref 26.0–34.0)
MCHC: 33.1 g/dL (ref 30.0–36.0)
MCV: 93.7 fL (ref 80.0–100.0)
Monocytes Absolute: 0.3 10*3/uL (ref 0.1–1.0)
Monocytes Relative: 9 %
Neutro Abs: 2.4 10*3/uL (ref 1.7–7.7)
Neutrophils Relative %: 60 %
Platelet Count: 77 10*3/uL — ABNORMAL LOW (ref 150–400)
RBC: 3.03 MIL/uL — ABNORMAL LOW (ref 4.22–5.81)
RDW: 14.9 % (ref 11.5–15.5)
WBC Count: 3.9 10*3/uL — ABNORMAL LOW (ref 4.0–10.5)
nRBC: 0 % (ref 0.0–0.2)

## 2020-07-11 MED ORDER — EPOETIN ALFA-EPBX 40000 UNIT/ML IJ SOLN
40000.0000 [IU] | Freq: Once | INTRAMUSCULAR | Status: AC
  Administered 2020-07-11: 40000 [IU] via SUBCUTANEOUS

## 2020-07-11 MED ORDER — EPOETIN ALFA-EPBX 40000 UNIT/ML IJ SOLN
INTRAMUSCULAR | Status: AC
  Filled 2020-07-11: qty 1

## 2020-07-11 NOTE — Patient Instructions (Signed)

## 2020-07-11 NOTE — Progress Notes (Signed)
Pt discharged in no apparent distress. Pt left ambulatory without assistance. Pt aware of discharge instructions and verbalized understanding and had no further questions.  

## 2020-07-18 ENCOUNTER — Inpatient Hospital Stay: Payer: Medicare Other

## 2020-07-18 ENCOUNTER — Other Ambulatory Visit: Payer: Self-pay

## 2020-07-18 ENCOUNTER — Telehealth: Payer: Self-pay | Admitting: Hematology & Oncology

## 2020-07-18 ENCOUNTER — Other Ambulatory Visit: Payer: Self-pay | Admitting: *Deleted

## 2020-07-18 ENCOUNTER — Inpatient Hospital Stay (HOSPITAL_BASED_OUTPATIENT_CLINIC_OR_DEPARTMENT_OTHER): Payer: Medicare Other | Admitting: Hematology & Oncology

## 2020-07-18 ENCOUNTER — Encounter: Payer: Self-pay | Admitting: Hematology & Oncology

## 2020-07-18 VITALS — BP 139/61 | HR 78 | Temp 97.7°F | Resp 17 | Wt 231.0 lb

## 2020-07-18 DIAGNOSIS — D4620 Refractory anemia with excess of blasts, unspecified: Secondary | ICD-10-CM

## 2020-07-18 DIAGNOSIS — D518 Other vitamin B12 deficiency anemias: Secondary | ICD-10-CM

## 2020-07-18 DIAGNOSIS — D461 Refractory anemia with ring sideroblasts: Secondary | ICD-10-CM | POA: Diagnosis not present

## 2020-07-18 DIAGNOSIS — D469 Myelodysplastic syndrome, unspecified: Secondary | ICD-10-CM | POA: Diagnosis not present

## 2020-07-18 DIAGNOSIS — D649 Anemia, unspecified: Secondary | ICD-10-CM

## 2020-07-18 LAB — CMP (CANCER CENTER ONLY)
ALT: 5 U/L (ref 0–44)
AST: 12 U/L — ABNORMAL LOW (ref 15–41)
Albumin: 4.3 g/dL (ref 3.5–5.0)
Alkaline Phosphatase: 89 U/L (ref 38–126)
Anion gap: 6 (ref 5–15)
BUN: 54 mg/dL — ABNORMAL HIGH (ref 8–23)
CO2: 30 mmol/L (ref 22–32)
Calcium: 10.1 mg/dL (ref 8.9–10.3)
Chloride: 102 mmol/L (ref 98–111)
Creatinine: 1.8 mg/dL — ABNORMAL HIGH (ref 0.61–1.24)
GFR, Estimated: 38 mL/min — ABNORMAL LOW (ref >60)
Glucose, Bld: 193 mg/dL — ABNORMAL HIGH (ref 70–99)
Potassium: 4.7 mmol/L (ref 3.5–5.1)
Sodium: 138 mmol/L (ref 135–145)
Total Bilirubin: 3.2 mg/dL — ABNORMAL HIGH (ref 0.3–1.2)
Total Protein: 7.4 g/dL (ref 6.5–8.1)

## 2020-07-18 LAB — CBC WITH DIFFERENTIAL (CANCER CENTER ONLY)
Abs Immature Granulocytes: 0.02 10*3/uL (ref 0.00–0.07)
Basophils Absolute: 0 10*3/uL (ref 0.0–0.1)
Basophils Relative: 1 %
Eosinophils Absolute: 0.2 10*3/uL (ref 0.0–0.5)
Eosinophils Relative: 4 %
HCT: 29 % — ABNORMAL LOW (ref 39.0–52.0)
Hemoglobin: 9.6 g/dL — ABNORMAL LOW (ref 13.0–17.0)
Immature Granulocytes: 1 %
Lymphocytes Relative: 20 %
Lymphs Abs: 0.9 10*3/uL (ref 0.7–4.0)
MCH: 30.6 pg (ref 26.0–34.0)
MCHC: 33.1 g/dL (ref 30.0–36.0)
MCV: 92.4 fL (ref 80.0–100.0)
Monocytes Absolute: 0.4 10*3/uL (ref 0.1–1.0)
Monocytes Relative: 9 %
Neutro Abs: 2.8 10*3/uL (ref 1.7–7.7)
Neutrophils Relative %: 65 %
Platelet Count: 82 10*3/uL — ABNORMAL LOW (ref 150–400)
RBC: 3.14 MIL/uL — ABNORMAL LOW (ref 4.22–5.81)
RDW: 14.8 % (ref 11.5–15.5)
WBC Count: 4.3 10*3/uL (ref 4.0–10.5)
nRBC: 0 % (ref 0.0–0.2)

## 2020-07-18 MED ORDER — LUSPATERCEPT-AAMT 75 MG ~~LOC~~ SOLR
100.0000 mg | Freq: Once | SUBCUTANEOUS | Status: AC
  Administered 2020-07-18: 100 mg via SUBCUTANEOUS
  Filled 2020-07-18: qty 0.5

## 2020-07-18 MED ORDER — EPOETIN ALFA-EPBX 40000 UNIT/ML IJ SOLN
40000.0000 [IU] | Freq: Once | INTRAMUSCULAR | Status: AC
  Administered 2020-07-18: 40000 [IU] via SUBCUTANEOUS

## 2020-07-18 MED ORDER — EPOETIN ALFA-EPBX 40000 UNIT/ML IJ SOLN
INTRAMUSCULAR | Status: AC
  Filled 2020-07-18: qty 1

## 2020-07-18 NOTE — Patient Instructions (Signed)
Luspatercept injection What is this medicine? LUSPATERCEPT (lus PAT er sept) helps your body make more red blood cells. This medicine is used to treat anemia caused by beta thalassemia or myelodysplastic syndromes. This medicine may be used for other purposes; ask your health care provider or pharmacist if you have questions. COMMON BRAND NAME(S): REBLOZYL What should I tell my health care provider before I take this medicine? They need to know if you have any of these conditions:  cigarette smoker  have had your spleen removed  high blood pressure  history of blood clots  an unusual or allergic reaction to luspatercept, other medicines, foods, dyes or preservatives  pregnant or trying to get pregnant  breast-feeding How should I use this medicine? This medicine is for injection under the skin. It is given by a healthcare professional in a hospital or clinic setting. Talk to your pediatrician about the use of the medicine in children. This medicine is not approved for use in children. Overdosage: If you think you have taken too much of this medicine contact a poison control center or emergency room at once. NOTE: This medicine is only for you. Do not share this medicine with others. What if I miss a dose? Keep appointments for follow-up doses. It is important not to miss your dose. Call your doctor or healthcare professional if you are unable to keep an appointment. What may interact with this medicine? Interactions are not expected. This list may not describe all possible interactions. Give your health care provider a list of all the medicines, herbs, non-prescription drugs, or dietary supplements you use. Also tell them if you smoke, drink alcohol, or use illegal drugs. Some items may interact with your medicine. What should I watch for while using this medicine? Your condition will be monitored carefully while you are receiving this medicine. Do not become pregnant while taking  this medicine or for 3 months after stopping it. Women should inform their healthcare professional if they wish to become pregnant or think they might be pregnant. There is a potential for serious side effects and harm to an unborn child. Talk to your healthcare professional for more information. Do not breast-feed an infant while taking this medicine. You may need blood work done while you are taking this medicine. What side effects may I notice from receiving this medicine? Side effects that you should report to your doctor or health care professional as soon as possible:  allergic reactions like skin rash, itching or hives; swelling of the face, lips, or tongue  signs and symptoms of a blood clot such as chest pain; shortness of breath; pain, swelling, or warmth in the leg  signs and symptoms of a stroke like changes in vision; confusion; trouble speaking or understanding; severe headaches; sudden numbness or weakness of the face, arm or leg; trouble walking; dizziness; loss of balance or coordination Side effects that usually do not require medical attention (report these to your doctor or health care professional if they continue or are bothersome):  cough  diarrhea  dizziness  headache  joint pain  stomach pain  tiredness This list may not describe all possible side effects. Call your doctor for medical advice about side effects. You may report side effects to FDA at 1-800-FDA-1088. Where should I keep my medicine? This medicine is given in a hospital or clinic and will not be stored at home. NOTE: This sheet is a summary. It may not cover all possible information. If you have questions about   this medicine, talk to your doctor, pharmacist, or health care provider.  2020 Elsevier/Gold Standard (2018-12-27 15:57:59)  

## 2020-07-18 NOTE — Progress Notes (Signed)
Hematology and Oncology Follow Up Visit  Eric [name] [id number] 19-Aug-1941 79 y.o. 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  He did go ahead and have the tumor biopsy done.  This is done on 05/10/2020.  The pathology report ([id number]) showed a hypercellular marrow.  There was some slight increase in sideroblasts.  There were no myeloblasts.  There is no atypical lymphoid aggregates.  There is no monoclonal B-cell population.  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  He still is having issues with anemia.  His CBC today shows a white count of 4.6.  Hemoglobin 8.3.  Platelet count 75,000.  He finally had the Luspatercept approved by his insurance company.  I am thankful that they were able to approve the Luspatercept.  Just with the Retacrit, his hemoglobin is slowly trending upward.  He has not required any blood transfusion for probably 6 weeks.  He does have some macular degeneration.  Does have some bleeding in the retina.  He is on Eliquis because of atrial fibrillation.  There is been no change in bowel or bladder habits.  He does have some occasional diarrhea.  He has had no problems with nausea or vomiting.  He has minimal leg swelling.  Since being taken off of diltiazem, he is lost quite a bit of fluid weight.  Currently, I was his performance status is probably ECOG 1-2.    Medications:  Current Outpatient Medications:  .  acetaminophen (TYLENOL) 325 MG tablet, Take 650 mg by mouth every 6 (six) hours as needed for moderate pain. , Disp: , Rfl:  .  allopurinol (ZYLOPRIM) 100 MG tablet, Take 100 mg by mouth daily., Disp: , Rfl:  .  ELIQUIS 5 MG TABS tablet, Take 1 tablet (5 mg total) by mouth 2  (two) times daily. Taking once daily, Disp: 60 tablet, Rfl: 5 .  insulin glargine, 1 Unit Dial, (TOUJEO SOLOSTAR) 300 UNIT/ML Solostar Pen, Inject 19 Units into the skin daily. , Disp: , Rfl:  .  insulin lispro (HUMALOG) 100 UNIT/ML KwikPen, Inject 20 Units into the skin 3 (three) times daily. , Disp: , Rfl:  .  levothyroxine (SYNTHROID) 100 MCG tablet, Take 50-100 mcg by mouth See admin instructions. Takes 100 mg daily except Sundays, on Sunday takes 50 mg, Disp: , Rfl:  .  Multiple Vitamins-Minerals (PRESERVISION AREDS 2+MULTI VIT PO), Take by mouth., Disp: , Rfl:  .  silodosin (RAPAFLO) 8 MG CAPS capsule, Take 8 mg by mouth daily. , Disp: , Rfl:  .  simvastatin (ZOCOR) 20 MG tablet, Take 1 tablet (20 mg total) by mouth at bedtime., Disp: 90 tablet, Rfl: 1 .  spironolactone (ALDACTONE) 50 MG tablet, Take 1 tablet (50 mg total) by mouth daily., Disp: 90 tablet, Rfl: 1 .  torsemide (DEMADEX) 20 MG tablet, Take 1 tablet (20 mg total) by mouth in the morning and at bedtime., Disp: 180 tablet, Rfl: 3 No current facility-administered medications for this visit.  Facility-Administered Medications Ordered in Other Visits:  .  epoetin alfa-epbx (RETACRIT) injection 40,000 Units, 40,000 Units, Subcutaneous, Once, Dorsey, John T IV, MD .  luspatercept-aamt (REBLOZYL) subcutaneous injection 100 mg, 100 mg, Subcutaneous, Once, [name], Rudell Cobb,  MD  Allergies:  Allergies  Allergen Reactions  . Iohexol Other (See Comments)     Desc: "shuts kidneys down"   . Morphine And Related Shortness Of Breath  . Hydrocodone Nausea And Vomiting  . Sulfa Antibiotics Other (See Comments)    UNKNOWN REACTION  . Sulfonamide Derivatives Other (See Comments)    UNKNOWN REACTION  . Trazodone And Nefazodone Rash    Pain    Past Medical History, Surgical history, Social history, and Family History were reviewed and updated.  Review of Systems: Review of Systems  Constitutional: Positive for fatigue.  HENT:   Negative.   Eyes: Negative.   Respiratory: Negative.   Cardiovascular: Positive for leg swelling.  Gastrointestinal: Negative.   Endocrine: Negative.   Genitourinary: Negative.    Musculoskeletal: Negative.   Skin: Negative.   Neurological: Negative.   Hematological: Negative.   Psychiatric/Behavioral: Negative.     Physical Exam:  vitals were not taken for this visit.   Wt Readings from Last 3 Encounters:  07/18/20 231 lb 0.6 oz (104.8 kg)  06/27/20 237 lb (107.5 kg)  06/25/20 235 lb (106.6 kg)    Physical Exam Vitals reviewed.  HENT:     Head: Normocephalic and atraumatic.  Eyes:     Pupils: Pupils are equal, round, and reactive to light.  Cardiovascular:     Rate and Rhythm: Normal rate and regular rhythm.     Heart sounds: Normal heart sounds.  Pulmonary:     Effort: Pulmonary effort is normal.     Breath sounds: Normal breath sounds.  Abdominal:     General: Bowel sounds are normal.     Palpations: Abdomen is soft.  Musculoskeletal:        General: No tenderness or deformity. Normal range of motion.     Cervical back: Normal range of motion.  Lymphadenopathy:     Cervical: No cervical adenopathy.  Skin:    General: Skin is warm and dry.     Findings: No erythema or rash.  Neurological:     Mental Status: He is alert and oriented to person, place, and time.  Psychiatric:        Behavior: Behavior normal.        Thought Content: Thought content normal.        Judgment: Judgment normal.      Lab Results  Component Value Date   WBC 4.3 07/18/2020   HGB 9.6 (L) 07/18/2020   HCT 29.0 (L) 07/18/2020   MCV 92.4 07/18/2020   PLT 82 (L) 07/18/2020     Chemistry      Component Value Date/Time   NA 138 07/18/2020 1051   K 4.7 07/18/2020 1051   CL 102 07/18/2020 1051   CO2 30 07/18/2020 1051   BUN 54 (H) 07/18/2020 1051   CREATININE 1.80 (H) 07/18/2020 1051      Component Value Date/Time   CALCIUM 10.1 07/18/2020 1051   ALKPHOS 89 07/18/2020 1051    AST 12 (L) 07/18/2020 1051   ALT 5 07/18/2020 1051   BILITOT 3.2 (H) 07/18/2020 1051      Impression and Plan: Eric [name] is a very nice 79 year old white male.  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  There is no blasts.  I would probably classify him as refractory anemia with ring sideroblasts.    Hopefully with the start of the Luspatercept, we will be able to get a good bump in his hemoglobin.  This would be nice  for the holiday season.  He will have a little more energy and more stamina.  We will continue the Retacrit weekly.  I will plan to get him back in another 3 weeks for the Luspatercept along with the Retacrit.  Hopefully, by then, his hemoglobin will be above 10.     Volanda Napoleon, MD 10/28/202111:59 AM

## 2020-07-18 NOTE — Telephone Encounter (Signed)
Appointments scheduled calendar printed & mailed per 10/28 los 

## 2020-07-19 ENCOUNTER — Other Ambulatory Visit: Payer: Self-pay | Admitting: Hematology & Oncology

## 2020-07-20 ENCOUNTER — Encounter (HOSPITAL_BASED_OUTPATIENT_CLINIC_OR_DEPARTMENT_OTHER): Payer: Self-pay | Admitting: Emergency Medicine

## 2020-07-20 ENCOUNTER — Emergency Department (HOSPITAL_BASED_OUTPATIENT_CLINIC_OR_DEPARTMENT_OTHER)
Admission: EM | Admit: 2020-07-20 | Discharge: 2020-07-20 | Disposition: A | Discharge status: Home / Self Care | Payer: Medicare Other | Attending: Emergency Medicine | Admitting: Emergency Medicine

## 2020-07-20 ENCOUNTER — Other Ambulatory Visit: Payer: Self-pay

## 2020-07-20 DIAGNOSIS — S50811A Abrasion of right forearm, initial encounter: Secondary | ICD-10-CM | POA: Diagnosis not present

## 2020-07-20 DIAGNOSIS — I5033 Acute on chronic diastolic (congestive) heart failure: Secondary | ICD-10-CM | POA: Diagnosis not present

## 2020-07-20 DIAGNOSIS — Z882 Allergy status to sulfonamides status: Secondary | ICD-10-CM | POA: Insufficient documentation

## 2020-07-20 DIAGNOSIS — Z794 Long term (current) use of insulin: Secondary | ICD-10-CM | POA: Insufficient documentation

## 2020-07-20 DIAGNOSIS — I130 Hypertensive heart and chronic kidney disease with heart failure and stage 1 through stage 4 chronic kidney disease, or unspecified chronic kidney disease: Secondary | ICD-10-CM | POA: Diagnosis not present

## 2020-07-20 DIAGNOSIS — Z96641 Presence of right artificial hip joint: Secondary | ICD-10-CM | POA: Diagnosis not present

## 2020-07-20 DIAGNOSIS — E039 Hypothyroidism, unspecified: Secondary | ICD-10-CM | POA: Insufficient documentation

## 2020-07-20 DIAGNOSIS — I2510 Atherosclerotic heart disease of native coronary artery without angina pectoris: Secondary | ICD-10-CM | POA: Insufficient documentation

## 2020-07-20 DIAGNOSIS — W07XXXD Fall from chair, subsequent encounter: Secondary | ICD-10-CM | POA: Diagnosis not present

## 2020-07-20 DIAGNOSIS — T148XXA Other injury of unspecified body region, initial encounter: Secondary | ICD-10-CM

## 2020-07-20 DIAGNOSIS — Z7901 Long term (current) use of anticoagulants: Secondary | ICD-10-CM | POA: Insufficient documentation

## 2020-07-20 DIAGNOSIS — E1122 Type 2 diabetes mellitus with diabetic chronic kidney disease: Secondary | ICD-10-CM | POA: Insufficient documentation

## 2020-07-20 DIAGNOSIS — N1832 Chronic kidney disease, stage 3b: Secondary | ICD-10-CM | POA: Diagnosis not present

## 2020-07-20 DIAGNOSIS — Z79899 Other long term (current) drug therapy: Secondary | ICD-10-CM | POA: Diagnosis not present

## 2020-07-20 NOTE — Discharge Instructions (Addendum)
Use bacitracin twice daily to wound as discussed.

## 2020-07-20 NOTE — ED Triage Notes (Signed)
Pt reports that he was trying to get up from chair when his hand slipped causing skin tear to right forearm.

## 2020-07-20 NOTE — ED Provider Notes (Signed)
Eric [name] Provider Note   CSN: [id number] Arrival date & time: 07/20/20  1326     History Chief Complaint  Patient presents with  . Wound Check    Eric [name] is a 79 y.o. male.  Patient here for evaluation of skin tear to right forearm.  Scraped it on a box yesterday.  Is a diabetic and concerned about infection.  The history is provided by the patient.  Wound Check This is a new problem. The current episode started yesterday. The problem has not changed since onset.Nothing aggravates the symptoms. Nothing relieves the symptoms. He has tried nothing for the symptoms. The treatment provided no relief.       Past Medical History:  Diagnosis Date  . Anemia   . Bilateral lower extremity edema   . Chronic atrial fibrillation ([place])   . Diastolic heart failure ([place])   . Gall bladder disease 2020  . Goals of care, counseling/discussion 05/29/2020  . History of right hip replacement    Pt stated 8-9 yrs ago  . 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 Refractory anemia with sideroblasts ([place]) 05/29/2020    Patient Active Problem List   Diagnosis Date Noted  . Goals of care, counseling/discussion 05/29/2020  . 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 Refractory anemia with sideroblasts ([place]) 05/29/2020  . Thrombocytopenia ([place]) 02/20/2020  . Neurocognitive deficits 02/20/2020  . Venous insufficiency 02/19/2020  . Acute on chronic diastolic heart failure ([place]) 02/12/2020  . AKI (acute kidney injury) ([place]) 06/29/2019  . 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  . Current use of long term anticoagulation 10/12/2018  . Dry skin 08/23/2018  . Hyperkalemia 07/13/2018  . Elevated liver enzymes 07/09/2018  . Colon polyp 07/08/2018  . Internal hemorrhoids 07/08/2018  . 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 Proteinuria 06/28/2018  . History of colon polyps 06/01/2018   . Change in stool 05/26/2018  . Bilateral edema of lower extremity 04/06/2018  . Influenza A 11/01/2017  . 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 Vitamin B12 deficiency 09/26/2014  . B12 deficiency anemia 09/19/2014  . Tendinitis of right shoulder 07/18/2014  . Degenerative arthritis of knee, bilateral 06/06/2014  . Hip pain 06/06/2014  . Abnormal results of liver function studies 04/24/2014  . Acquired hypothyroidism 04/24/2014  . 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 Bulging lumbar disc 04/24/2014  . CHF (congestive heart failure) ([place]) 04/24/2014  . Gout 04/24/2014  . Chronic coronary artery disease 04/24/2014  . Diabetes with neurologic complications ([place]) 31/49/7026  . Diabetes, polyneuropathy ([place]) 04/24/2014  . Disorder of kidney and ureter, unspecified 04/24/2014  . Gallstones 04/24/2014  . Hearing loss 04/24/2014  . Nasal vestibulitis 04/24/2014  . Osteoarthritis of hip, unspecified 04/24/2014  . Other and unspecified hyperlipidemia 04/24/2014  . Pain in joint, pelvic region and thigh 04/24/2014  . 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 Thoracic or lumbosacral neuritis or radiculitis, unspecified 04/24/2014  . Cellulitis of arm 03/21/2014  . Skin lesion of left leg 03/21/2014  . Type 2 diabetes mellitus with stage 3b chronic kidney disease, with long-term current use of insulin ([place]) 02/25/2013  . Primary localized osteoarthrosis, pelvic region and thigh 02/25/2013  . Sacroiliitis ([place]) 02/25/2013  . DIASTOLIC HEART FAILURE, CHRONIC 02/07/2009  . 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[place]) 02/06/2009  . Obesity 02/06/2009  . Benign essential hypertension 02/06/2009  . Atrial fibrillation ([place]) 01/15/2009  Past Surgical History:  Procedure Laterality Date  . APPENDECTOMY    . CHOLECYSTECTOMY    . TOTAL HIP ARTHROPLASTY         No family history on file.  Social History   Tobacco Use  . Smoking status: Never  Smoker  . Smokeless tobacco: Never Used  Vaping Use  . Vaping Use: Never used  Substance Use Topics  . Alcohol use: Never  . Drug use: Never    Home Medications Prior to Admission medications   Medication Sig Start Date End Date Taking? Authorizing Provider  acetaminophen (TYLENOL) 325 MG tablet Take 650 mg by mouth every 6 (six) hours as needed for moderate pain.     [provider]  allopurinol (ZYLOPRIM) 100 MG tablet Take 100 mg by mouth daily. 05/15/20   [provider]  ELIQUIS 5 MG TABS tablet Take 1 tablet (5 mg total) by mouth 2 (two) times daily. Taking once daily 06/28/20   Tobb, Kardie, DO  insulin glargine, 1 Unit Dial, (TOUJEO SOLOSTAR) 300 UNIT/ML Solostar Pen Inject 19 Units into the skin daily.  08/31/15   [provider]  insulin lispro (HUMALOG) 100 UNIT/ML KwikPen Inject 20 Units into the skin 3 (three) times daily.  04/15/15   [provider]  levothyroxine (SYNTHROID) 100 MCG tablet Take 50-100 mcg by mouth See admin instructions. Takes 100 mg daily except Sundays, on Sunday takes 50 mg 01/14/20   [provider]  Multiple Vitamins-Minerals (PRESERVISION AREDS 2+MULTI VIT PO) Take by mouth.    [provider]  silodosin (RAPAFLO) 8 MG CAPS capsule Take 8 mg by mouth daily.  12/13/19   [provider]  simvastatin (ZOCOR) 20 MG tablet Take 1 tablet (20 mg total) by mouth at bedtime. 06/26/20   Richardo Priest, MD  spironolactone (ALDACTONE) 50 MG tablet Take 1 tablet (50 mg total) by mouth daily. 06/26/20   Richardo Priest, MD  torsemide (DEMADEX) 20 MG tablet Take 1 tablet (20 mg total) by mouth in the morning and at bedtime. 06/03/20   Richardo Priest, MD  traZODone (DESYREL) 50 MG tablet  03/14/20 04/19/20  [provider]    Allergies    Iohexol, Morphine and related, Hydrocodone, Sulfa antibiotics, Sulfonamide derivatives, and Trazodone and nefazodone  Review of Systems   Review of Systems    Constitutional: Negative for fever.  Musculoskeletal: Negative for arthralgias.  Skin: Positive for wound.  Neurological: Negative for weakness and numbness.    Physical Exam Updated Vital Signs BP (!) 149/70 (BP Location: Left Arm)   Pulse 79   Temp 98.2 F (36.8 C) (Oral)   Resp 18   Ht '5\' 11"'  (1.803 m)   Wt 104.8 kg   SpO2 100%   BMI 32.22 kg/m   Physical Exam Constitutional:      General: He is not in acute distress.    Appearance: He is not ill-appearing.  Skin:    Capillary Refill: Capillary refill takes less than 2 seconds.     Comments: Uncomplicated skin tear to the right forearm with no evidence of infection, hemostatic  Neurological:     General: No focal deficit present.     Mental Status: He is alert.     Sensory: No sensory deficit.     Motor: No weakness.     ED Results / Procedures / Treatments   Labs (all labs ordered are listed, but only abnormal results are displayed) Labs Reviewed - No data to  display  EKG None  Radiology No results found.  Procedures Procedures (including critical care time)  Medications Ordered in ED Medications - No data to display  ED Course  I have reviewed the triage vital signs and the nursing notes.  Pertinent labs & imaging results that were available during my care of the patient were reviewed by me and considered in my medical decision making (see chart for details).    MDM Rules/Calculators/A&P                          Bakari Nikolai is a 79 year old male here for right forearm skin tear.  Overall reassured that wound looks well.  No signs of infection.  Recommend Neosporin/bacitracin twice daily and wound care discussed.  Understands return precautions.  Recommend follow-up with primary care doctor and discharged from ED in good condition.  This chart was dictated using voice recognition software.  Despite best efforts to proofread,  errors can occur which can change the documentation meaning.   Final  Clinical Impression(s) / ED Diagnoses Final diagnoses:  Abrasion    Rx / DC Orders ED Discharge Orders    None       Lennice Sites, DO 07/20/20 1514

## 2020-07-24 ENCOUNTER — Other Ambulatory Visit: Payer: Self-pay

## 2020-07-24 DIAGNOSIS — D461 Refractory anemia with ring sideroblasts: Secondary | ICD-10-CM

## 2020-07-25 ENCOUNTER — Other Ambulatory Visit: Payer: Self-pay

## 2020-07-25 ENCOUNTER — Inpatient Hospital Stay: Payer: Medicare Other | Attending: Hematology & Oncology

## 2020-07-25 ENCOUNTER — Inpatient Hospital Stay: Payer: Medicare Other

## 2020-07-25 DIAGNOSIS — D469 Myelodysplastic syndrome, unspecified: Secondary | ICD-10-CM | POA: Insufficient documentation

## 2020-07-25 DIAGNOSIS — R634 Abnormal weight loss: Secondary | ICD-10-CM | POA: Insufficient documentation

## 2020-07-25 DIAGNOSIS — E119 Type 2 diabetes mellitus without complications: Secondary | ICD-10-CM | POA: Insufficient documentation

## 2020-07-25 DIAGNOSIS — Z7901 Long term (current) use of anticoagulants: Secondary | ICD-10-CM | POA: Insufficient documentation

## 2020-07-25 DIAGNOSIS — D461 Refractory anemia with ring sideroblasts: Secondary | ICD-10-CM

## 2020-07-25 DIAGNOSIS — Z79899 Other long term (current) drug therapy: Secondary | ICD-10-CM | POA: Diagnosis not present

## 2020-07-25 LAB — CMP (CANCER CENTER ONLY)
ALT: 6 U/L (ref 0–44)
AST: 12 U/L — ABNORMAL LOW (ref 15–41)
Albumin: 4.3 g/dL (ref 3.5–5.0)
Alkaline Phosphatase: 105 U/L (ref 38–126)
Anion gap: 8 (ref 5–15)
BUN: 50 mg/dL — ABNORMAL HIGH (ref 8–23)
CO2: 30 mmol/L (ref 22–32)
Calcium: 9.9 mg/dL (ref 8.9–10.3)
Chloride: 103 mmol/L (ref 98–111)
Creatinine: 1.88 mg/dL — ABNORMAL HIGH (ref 0.61–1.24)
GFR, Estimated: 36 mL/min — ABNORMAL LOW (ref >60)
Glucose, Bld: 66 mg/dL — ABNORMAL LOW (ref 70–99)
Potassium: 3.9 mmol/L (ref 3.5–5.1)
Sodium: 141 mmol/L (ref 135–145)
Total Bilirubin: 3 mg/dL — ABNORMAL HIGH (ref 0.3–1.2)
Total Protein: 7.4 g/dL (ref 6.5–8.1)

## 2020-07-25 LAB — CBC WITH DIFFERENTIAL (CANCER CENTER ONLY)
Abs Immature Granulocytes: 0.03 10*3/uL (ref 0.00–0.07)
Basophils Absolute: 0 10*3/uL (ref 0.0–0.1)
Basophils Relative: 1 %
Eosinophils Absolute: 0.2 10*3/uL (ref 0.0–0.5)
Eosinophils Relative: 2 %
HCT: 33.1 % — ABNORMAL LOW (ref 39.0–52.0)
Hemoglobin: 11 g/dL — ABNORMAL LOW (ref 13.0–17.0)
Immature Granulocytes: 1 %
Lymphocytes Relative: 18 %
Lymphs Abs: 1.2 10*3/uL (ref 0.7–4.0)
MCH: 30.2 pg (ref 26.0–34.0)
MCHC: 33.2 g/dL (ref 30.0–36.0)
MCV: 90.9 fL (ref 80.0–100.0)
Monocytes Absolute: 0.7 10*3/uL (ref 0.1–1.0)
Monocytes Relative: 10 %
Neutro Abs: 4.5 10*3/uL (ref 1.7–7.7)
Neutrophils Relative %: 68 %
Platelet Count: 97 10*3/uL — ABNORMAL LOW (ref 150–400)
RBC: 3.64 MIL/uL — ABNORMAL LOW (ref 4.22–5.81)
RDW: 15 % (ref 11.5–15.5)
WBC Count: 6.5 10*3/uL (ref 4.0–10.5)
nRBC: 0 % (ref 0.0–0.2)

## 2020-07-25 NOTE — Progress Notes (Signed)
Patient does not need his luspatercept today as HGB is 11. Patient informed and was also informed his glucose was 66, offered him some juice or peanut butter crackers.  He declined but did grab a piece of candy. States his blood sugars have been flucuating today and did take his insulin. Advised to call his endocrinologist since he has lost weight. Patient agreed and will call with any other concerns. He was stable at discharge.

## 2020-07-26 ENCOUNTER — Inpatient Hospital Stay: Payer: Medicare Other

## 2020-07-26 ENCOUNTER — Inpatient Hospital Stay: Payer: Medicare Other | Admitting: Hematology & Oncology

## 2020-07-31 ENCOUNTER — Other Ambulatory Visit: Payer: Self-pay | Admitting: *Deleted

## 2020-07-31 DIAGNOSIS — D461 Refractory anemia with ring sideroblasts: Secondary | ICD-10-CM

## 2020-08-01 ENCOUNTER — Other Ambulatory Visit: Payer: Self-pay

## 2020-08-01 ENCOUNTER — Inpatient Hospital Stay: Payer: Medicare Other

## 2020-08-01 VITALS — BP 136/66 | HR 73 | Resp 16

## 2020-08-01 DIAGNOSIS — D469 Myelodysplastic syndrome, unspecified: Secondary | ICD-10-CM | POA: Diagnosis not present

## 2020-08-01 DIAGNOSIS — D461 Refractory anemia with ring sideroblasts: Secondary | ICD-10-CM

## 2020-08-01 DIAGNOSIS — D518 Other vitamin B12 deficiency anemias: Secondary | ICD-10-CM

## 2020-08-01 LAB — CBC WITH DIFFERENTIAL (CANCER CENTER ONLY)
Abs Immature Granulocytes: 0.02 10*3/uL (ref 0.00–0.07)
Basophils Absolute: 0.1 10*3/uL (ref 0.0–0.1)
Basophils Relative: 1 %
Eosinophils Absolute: 0.2 10*3/uL (ref 0.0–0.5)
Eosinophils Relative: 4 %
HCT: 32.1 % — ABNORMAL LOW (ref 39.0–52.0)
Hemoglobin: 10.7 g/dL — ABNORMAL LOW (ref 13.0–17.0)
Immature Granulocytes: 0 %
Lymphocytes Relative: 29 %
Lymphs Abs: 1.4 10*3/uL (ref 0.7–4.0)
MCH: 29.6 pg (ref 26.0–34.0)
MCHC: 33.3 g/dL (ref 30.0–36.0)
MCV: 88.7 fL (ref 80.0–100.0)
Monocytes Absolute: 0.5 10*3/uL (ref 0.1–1.0)
Monocytes Relative: 11 %
Neutro Abs: 2.8 10*3/uL (ref 1.7–7.7)
Neutrophils Relative %: 55 %
Platelet Count: 105 10*3/uL — ABNORMAL LOW (ref 150–400)
RBC: 3.62 MIL/uL — ABNORMAL LOW (ref 4.22–5.81)
RDW: 14.6 % (ref 11.5–15.5)
WBC Count: 5 10*3/uL (ref 4.0–10.5)
nRBC: 0 % (ref 0.0–0.2)

## 2020-08-01 LAB — COMPREHENSIVE METABOLIC PANEL
ALT: 5 U/L (ref 0–44)
AST: 12 U/L — ABNORMAL LOW (ref 15–41)
Albumin: 4.2 g/dL (ref 3.5–5.0)
Alkaline Phosphatase: 85 U/L (ref 38–126)
Anion gap: 6 (ref 5–15)
BUN: 55 mg/dL — ABNORMAL HIGH (ref 8–23)
CO2: 31 mmol/L (ref 22–32)
Calcium: 10 mg/dL (ref 8.9–10.3)
Chloride: 104 mmol/L (ref 98–111)
Creatinine, Ser: 1.95 mg/dL — ABNORMAL HIGH (ref 0.61–1.24)
GFR, Estimated: 34 mL/min — ABNORMAL LOW (ref >60)
Glucose, Bld: 119 mg/dL — ABNORMAL HIGH (ref 70–99)
Potassium: 4.6 mmol/L (ref 3.5–5.1)
Sodium: 141 mmol/L (ref 135–145)
Total Bilirubin: 3.3 mg/dL — ABNORMAL HIGH (ref 0.3–1.2)
Total Protein: 7.2 g/dL (ref 6.5–8.1)

## 2020-08-01 MED ORDER — EPOETIN ALFA-EPBX 40000 UNIT/ML IJ SOLN
INTRAMUSCULAR | Status: AC
  Filled 2020-08-01: qty 1

## 2020-08-01 MED ORDER — EPOETIN ALFA-EPBX 40000 UNIT/ML IJ SOLN
40000.0000 [IU] | Freq: Once | INTRAMUSCULAR | Status: AC
  Administered 2020-08-01: 40000 [IU] via SUBCUTANEOUS

## 2020-08-01 NOTE — Patient Instructions (Signed)

## 2020-08-07 ENCOUNTER — Inpatient Hospital Stay (HOSPITAL_BASED_OUTPATIENT_CLINIC_OR_DEPARTMENT_OTHER): Payer: Medicare Other | Admitting: Hematology & Oncology

## 2020-08-07 ENCOUNTER — Encounter: Payer: Self-pay | Admitting: Hematology & Oncology

## 2020-08-07 ENCOUNTER — Inpatient Hospital Stay: Payer: Medicare Other

## 2020-08-07 ENCOUNTER — Telehealth: Payer: Self-pay

## 2020-08-07 ENCOUNTER — Other Ambulatory Visit: Payer: Self-pay

## 2020-08-07 VITALS — BP 136/68 | HR 75 | Temp 97.8°F | Resp 20

## 2020-08-07 VITALS — BP 121/89 | HR 72 | Temp 97.5°F | Resp 20 | Wt 224.0 lb

## 2020-08-07 DIAGNOSIS — D461 Refractory anemia with ring sideroblasts: Secondary | ICD-10-CM

## 2020-08-07 DIAGNOSIS — D4620 Refractory anemia with excess of blasts, unspecified: Secondary | ICD-10-CM

## 2020-08-07 DIAGNOSIS — D469 Myelodysplastic syndrome, unspecified: Secondary | ICD-10-CM | POA: Diagnosis not present

## 2020-08-07 DIAGNOSIS — D518 Other vitamin B12 deficiency anemias: Secondary | ICD-10-CM

## 2020-08-07 LAB — CBC WITH DIFFERENTIAL (CANCER CENTER ONLY)
Abs Immature Granulocytes: 0.05 10*3/uL (ref 0.00–0.07)
Basophils Absolute: 0 10*3/uL (ref 0.0–0.1)
Basophils Relative: 1 %
Eosinophils Absolute: 0.2 10*3/uL (ref 0.0–0.5)
Eosinophils Relative: 3 %
HCT: 32.7 % — ABNORMAL LOW (ref 39.0–52.0)
Hemoglobin: 10.8 g/dL — ABNORMAL LOW (ref 13.0–17.0)
Immature Granulocytes: 1 %
Lymphocytes Relative: 21 %
Lymphs Abs: 1.2 10*3/uL (ref 0.7–4.0)
MCH: 29.8 pg (ref 26.0–34.0)
MCHC: 33 g/dL (ref 30.0–36.0)
MCV: 90.1 fL (ref 80.0–100.0)
Monocytes Absolute: 0.5 10*3/uL (ref 0.1–1.0)
Monocytes Relative: 9 %
Neutro Abs: 3.6 10*3/uL (ref 1.7–7.7)
Neutrophils Relative %: 65 %
Platelet Count: 112 10*3/uL — ABNORMAL LOW (ref 150–400)
RBC: 3.63 MIL/uL — ABNORMAL LOW (ref 4.22–5.81)
RDW: 15.7 % — ABNORMAL HIGH (ref 11.5–15.5)
WBC Count: 5.6 10*3/uL (ref 4.0–10.5)
nRBC: 0 % (ref 0.0–0.2)

## 2020-08-07 LAB — CMP (CANCER CENTER ONLY)
ALT: 7 U/L (ref 0–44)
AST: 13 U/L — ABNORMAL LOW (ref 15–41)
Albumin: 4.3 g/dL (ref 3.5–5.0)
Alkaline Phosphatase: 93 U/L (ref 38–126)
Anion gap: 7 (ref 5–15)
BUN: 48 mg/dL — ABNORMAL HIGH (ref 8–23)
CO2: 30 mmol/L (ref 22–32)
Calcium: 9.9 mg/dL (ref 8.9–10.3)
Chloride: 103 mmol/L (ref 98–111)
Creatinine: 1.95 mg/dL — ABNORMAL HIGH (ref 0.61–1.24)
GFR, Estimated: 34 mL/min — ABNORMAL LOW (ref >60)
Glucose, Bld: 119 mg/dL — ABNORMAL HIGH (ref 70–99)
Potassium: 4 mmol/L (ref 3.5–5.1)
Sodium: 140 mmol/L (ref 135–145)
Total Bilirubin: 2.9 mg/dL — ABNORMAL HIGH (ref 0.3–1.2)
Total Protein: 7.7 g/dL (ref 6.5–8.1)

## 2020-08-07 LAB — RETICULOCYTES
Immature Retic Fract: 17.1 % — ABNORMAL HIGH (ref 2.3–15.9)
RBC.: 3.65 MIL/uL — ABNORMAL LOW (ref 4.22–5.81)
Retic Count, Absolute: 221.6 10*3/uL — ABNORMAL HIGH (ref 19.0–186.0)
Retic Ct Pct: 6.1 % — ABNORMAL HIGH (ref 0.4–3.1)

## 2020-08-07 MED ORDER — EPOETIN ALFA-EPBX 40000 UNIT/ML IJ SOLN
40000.0000 [IU] | Freq: Once | INTRAMUSCULAR | Status: AC
  Administered 2020-08-07: 40000 [IU] via SUBCUTANEOUS

## 2020-08-07 MED ORDER — LUSPATERCEPT-AAMT 75 MG ~~LOC~~ SOLR
100.0000 mg | Freq: Once | SUBCUTANEOUS | Status: AC
  Administered 2020-08-07: 100 mg via SUBCUTANEOUS
  Filled 2020-08-07: qty 1.5

## 2020-08-07 MED ORDER — EPOETIN ALFA-EPBX 40000 UNIT/ML IJ SOLN
INTRAMUSCULAR | Status: AC
  Filled 2020-08-07: qty 1

## 2020-08-07 NOTE — Patient Instructions (Signed)
Luspatercept injection What is this medicine? LUSPATERCEPT (lus PAT er sept) helps your body make more red blood cells. This medicine is used to treat anemia caused by beta thalassemia or myelodysplastic syndromes. This medicine may be used for other purposes; ask your health care provider or pharmacist if you have questions. COMMON BRAND NAME(S): REBLOZYL What should I tell my health care provider before I take this medicine? They need to know if you have any of these conditions:  cigarette smoker  have had your spleen removed  high blood pressure  history of blood clots  an unusual or allergic reaction to luspatercept, other medicines, foods, dyes or preservatives  pregnant or trying to get pregnant  breast-feeding How should I use this medicine? This medicine is for injection under the skin. It is given by a healthcare professional in a hospital or clinic setting. Talk to your pediatrician about the use of the medicine in children. This medicine is not approved for use in children. Overdosage: If you think you have taken too much of this medicine contact a poison control center or emergency room at once. NOTE: This medicine is only for you. Do not share this medicine with others. What if I miss a dose? Keep appointments for follow-up doses. It is important not to miss your dose. Call your doctor or healthcare professional if you are unable to keep an appointment. What may interact with this medicine? Interactions are not expected. This list may not describe all possible interactions. Give your health care provider a list of all the medicines, herbs, non-prescription drugs, or dietary supplements you use. Also tell them if you smoke, drink alcohol, or use illegal drugs. Some items may interact with your medicine. What should I watch for while using this medicine? Your condition will be monitored carefully while you are receiving this medicine. Do not become pregnant while taking  this medicine or for 3 months after stopping it. Women should inform their healthcare professional if they wish to become pregnant or think they might be pregnant. There is a potential for serious side effects and harm to an unborn child. Talk to your healthcare professional for more information. Do not breast-feed an infant while taking this medicine. You may need blood work done while you are taking this medicine. What side effects may I notice from receiving this medicine? Side effects that you should report to your doctor or health care professional as soon as possible:  allergic reactions like skin rash, itching or hives; swelling of the face, lips, or tongue  signs and symptoms of a blood clot such as chest pain; shortness of breath; pain, swelling, or warmth in the leg  signs and symptoms of a stroke like changes in vision; confusion; trouble speaking or understanding; severe headaches; sudden numbness or weakness of the face, arm or leg; trouble walking; dizziness; loss of balance or coordination Side effects that usually do not require medical attention (report these to your doctor or health care professional if they continue or are bothersome):  cough  diarrhea  dizziness  headache  joint pain  stomach pain  tiredness This list may not describe all possible side effects. Call your doctor for medical advice about side effects. You may report side effects to FDA at 1-800-FDA-1088. Where should I keep my medicine? This medicine is given in a hospital or clinic and will not be stored at home. NOTE: This sheet is a summary. It may not cover all possible information. If you have questions about   this medicine, talk to your doctor, pharmacist, or health care provider.  2020 Elsevier/Gold Standard (2018-12-27 15:57:59)  

## 2020-08-07 NOTE — Telephone Encounter (Signed)
Called and left a vm with appts per 08/07/20 los... AOM

## 2020-08-07 NOTE — Progress Notes (Signed)
Pt discharged in no apparent distress. Pt left ambulatory without assistance. Pt aware of discharge instructions and verbalized understanding and had no further questions.  

## 2020-08-07 NOTE — Progress Notes (Signed)
Hematology and Oncology Follow Up Visit  Eric [name] [id number] 1941-05-14 79 y.o. 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 is back for follow-up.  Overall, he is doing pretty well.  He has responded nicely to the [place] and Retacrit.  His hemoglobin has been consistently above 10.  His blood sugars are a problem.  They have been dropping quite low.  He said his blood sugar this morning was 50.  He sees his endocrinologist I think next week.  I am sure with the weight loss that he will have his insulin dose adjusted.  He has had no problems with bleeding.  He has had no change in bowel or bladder habits.  He has had some loose stool.  Is been some leg swelling which has been chronic.  He wears compression stockings.  His iron studies back in October showed a ferritin 117 with an iron saturation of 21%.  We will have to watch this closely and may be given iron back if we need to.  Currently, I was his performance status is probably ECOG 1-2.    Medications:  Current Outpatient Medications:  .  acetaminophen (TYLENOL) 325 MG tablet, Take 650 mg by mouth every 6 (six) hours as needed for moderate pain. , Disp: , Rfl:  .  allopurinol (ZYLOPRIM) 100 MG tablet, Take 100 mg by mouth daily., Disp: , Rfl:  .  ELIQUIS 5 MG TABS tablet, Take 1 tablet (5 mg total) by mouth 2 (two) times daily. Taking once daily, Disp: 60 tablet, Rfl: 5 .  insulin glargine, 1 Unit Dial, (TOUJEO SOLOSTAR) 300 UNIT/ML Solostar Pen, Inject 26 Units into the skin daily. , Disp: , Rfl:  .  insulin lispro (HUMALOG) 100 UNIT/ML KwikPen, Inject 14 Units into the skin 3 (three) times daily. , Disp: , Rfl:  .  levothyroxine (SYNTHROID) 100 MCG tablet, Take 50-100 mcg by mouth See admin instructions. Takes 100 mg  daily except Sundays, on Sunday takes 50 mg, Disp: , Rfl:  .  silodosin (RAPAFLO) 8 MG CAPS capsule, Take 8 mg by mouth daily. , Disp: , Rfl:  .  simvastatin (ZOCOR) 20 MG tablet, Take 1 tablet (20 mg total) by mouth at bedtime., Disp: 90 tablet, Rfl: 1 .  spironolactone (ALDACTONE) 50 MG tablet, Take 1 tablet (50 mg total) by mouth daily., Disp: 90 tablet, Rfl: 1 .  torsemide (DEMADEX) 20 MG tablet, Take 1 tablet (20 mg total) by mouth in the morning and at bedtime., Disp: 180 tablet, Rfl: 3 .  Multiple Vitamins-Minerals (PRESERVISION AREDS 2+MULTI VIT PO), Take by mouth. (Patient not taking: Reported on 08/07/2020), Disp: , Rfl:   Allergies:  Allergies  Allergen Reactions  . Iohexol Other (See Comments)     Desc: "shuts kidneys down"   . Morphine And Related Shortness Of Breath  . Hydrocodone Nausea And Vomiting  . Sulfa Antibiotics Other (See Comments)    UNKNOWN REACTION  . Sulfonamide Derivatives Other (See Comments)    UNKNOWN REACTION  . Trazodone And Nefazodone Rash    Pain    Past Medical History, Surgical history, Social history, and Family History were reviewed and updated.  Review of Systems: Review of Systems  Constitutional: Positive for fatigue.  HENT:  Negative.  Eyes: Negative.   Respiratory: Negative.   Cardiovascular: Positive for leg swelling.  Gastrointestinal: Negative.   Endocrine: Negative.   Genitourinary: Negative.    Musculoskeletal: Negative.   Skin: Negative.   Neurological: Negative.   Hematological: Negative.   Psychiatric/Behavioral: Negative.     Physical Exam:  weight is 224 lb (101.6 kg). His oral temperature is 97.5 F (36.4 C) (abnormal). His blood pressure is 121/89 and his pulse is 72. His respiration is 20 and oxygen saturation is 100%.   Wt Readings from Last 3 Encounters:  08/07/20 224 lb (101.6 kg)  07/20/20 231 lb 0.6 oz (104.8 kg)  07/18/20 231 lb 0.6 oz (104.8 kg)    Physical Exam Vitals reviewed.  HENT:     Head:  Normocephalic and atraumatic.  Eyes:     Pupils: Pupils are equal, round, and reactive to light.  Cardiovascular:     Rate and Rhythm: Normal rate and regular rhythm.     Heart sounds: Normal heart sounds.  Pulmonary:     Effort: Pulmonary effort is normal.     Breath sounds: Normal breath sounds.  Abdominal:     General: Bowel sounds are normal.     Palpations: Abdomen is soft.  Musculoskeletal:        General: No tenderness or deformity. Normal range of motion.     Cervical back: Normal range of motion.  Lymphadenopathy:     Cervical: No cervical adenopathy.  Skin:    General: Skin is warm and dry.     Findings: No erythema or rash.  Neurological:     Mental Status: He is alert and oriented to person, place, and time.  Psychiatric:        Behavior: Behavior normal.        Thought Content: Thought content normal.        Judgment: Judgment normal.      Lab Results  Component Value Date   WBC 5.6 08/07/2020   HGB 10.8 (L) 08/07/2020   HCT 32.7 (L) 08/07/2020   MCV 90.1 08/07/2020   PLT 112 (L) 08/07/2020     Chemistry      Component Value Date/Time   NA 140 08/07/2020 1147   K 4.0 08/07/2020 1147   CL 103 08/07/2020 1147   CO2 30 08/07/2020 1147   BUN 48 (H) 08/07/2020 1147   CREATININE 1.95 (H) 08/07/2020 1147      Component Value Date/Time   CALCIUM 9.9 08/07/2020 1147   ALKPHOS 93 08/07/2020 1147   AST 13 (L) 08/07/2020 1147   ALT 7 08/07/2020 1147   BILITOT 2.9 (H) 08/07/2020 1147      Impression and Plan: Eric [name] is a very nice 79 year old white male.  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  There is no blasts.  I would probably classify him as refractory anemia with ring sideroblasts.    I really have to believe that he is going to do well with the Luspatercept and Retacrit.  He has responded nicely.  We have not had to transfuse him for several months which is wonderful.  I am sure once his blood sugars get adjusted, then things  will get better for him with his quality of life.  We will plan to get him back to see [place] in another 3 weeks.  He comes back weekly for his Retacrit if necessary.   Volanda Napoleon, MD 11/17/202112:59 PM

## 2020-08-08 LAB — IRON AND TIBC
Iron: 134 ug/dL (ref 42–163)
Saturation Ratios: 37 % (ref 20–55)
TIBC: 363 ug/dL (ref 202–409)
UIBC: 229 ug/dL (ref 117–376)

## 2020-08-08 LAB — FERRITIN: Ferritin: 25 ng/mL (ref 24–336)

## 2020-08-13 ENCOUNTER — Other Ambulatory Visit: Payer: Self-pay | Admitting: *Deleted

## 2020-08-13 DIAGNOSIS — D649 Anemia, unspecified: Secondary | ICD-10-CM

## 2020-08-13 DIAGNOSIS — D4620 Refractory anemia with excess of blasts, unspecified: Secondary | ICD-10-CM

## 2020-08-13 DIAGNOSIS — D461 Refractory anemia with ring sideroblasts: Secondary | ICD-10-CM

## 2020-08-13 DIAGNOSIS — D518 Other vitamin B12 deficiency anemias: Secondary | ICD-10-CM

## 2020-08-14 ENCOUNTER — Inpatient Hospital Stay: Payer: Medicare Other

## 2020-08-14 ENCOUNTER — Other Ambulatory Visit: Payer: Self-pay

## 2020-08-14 DIAGNOSIS — D461 Refractory anemia with ring sideroblasts: Secondary | ICD-10-CM

## 2020-08-14 DIAGNOSIS — D469 Myelodysplastic syndrome, unspecified: Secondary | ICD-10-CM | POA: Diagnosis not present

## 2020-08-14 DIAGNOSIS — D518 Other vitamin B12 deficiency anemias: Secondary | ICD-10-CM

## 2020-08-14 DIAGNOSIS — D4620 Refractory anemia with excess of blasts, unspecified: Secondary | ICD-10-CM

## 2020-08-14 DIAGNOSIS — D649 Anemia, unspecified: Secondary | ICD-10-CM

## 2020-08-14 LAB — CBC WITH DIFFERENTIAL (CANCER CENTER ONLY)
Abs Immature Granulocytes: 0.03 10*3/uL (ref 0.00–0.07)
Basophils Absolute: 0.1 10*3/uL (ref 0.0–0.1)
Basophils Relative: 1 %
Eosinophils Absolute: 0.2 10*3/uL (ref 0.0–0.5)
Eosinophils Relative: 4 %
HCT: 36 % — ABNORMAL LOW (ref 39.0–52.0)
Hemoglobin: 11.8 g/dL — ABNORMAL LOW (ref 13.0–17.0)
Immature Granulocytes: 1 %
Lymphocytes Relative: 25 %
Lymphs Abs: 1.4 10*3/uL (ref 0.7–4.0)
MCH: 29.6 pg (ref 26.0–34.0)
MCHC: 32.8 g/dL (ref 30.0–36.0)
MCV: 90.2 fL (ref 80.0–100.0)
Monocytes Absolute: 0.6 10*3/uL (ref 0.1–1.0)
Monocytes Relative: 10 %
Neutro Abs: 3.4 10*3/uL (ref 1.7–7.7)
Neutrophils Relative %: 59 %
Platelet Count: 111 10*3/uL — ABNORMAL LOW (ref 150–400)
RBC: 3.99 MIL/uL — ABNORMAL LOW (ref 4.22–5.81)
RDW: 15.8 % — ABNORMAL HIGH (ref 11.5–15.5)
WBC Count: 5.7 10*3/uL (ref 4.0–10.5)
nRBC: 0 % (ref 0.0–0.2)

## 2020-08-14 LAB — CMP (CANCER CENTER ONLY)
ALT: 5 U/L (ref 0–44)
AST: 12 U/L — ABNORMAL LOW (ref 15–41)
Albumin: 4.4 g/dL (ref 3.5–5.0)
Alkaline Phosphatase: 87 U/L (ref 38–126)
Anion gap: 6 (ref 5–15)
BUN: 46 mg/dL — ABNORMAL HIGH (ref 8–23)
CO2: 31 mmol/L (ref 22–32)
Calcium: 10.3 mg/dL (ref 8.9–10.3)
Chloride: 103 mmol/L (ref 98–111)
Creatinine: 1.91 mg/dL — ABNORMAL HIGH (ref 0.61–1.24)
GFR, Estimated: 35 mL/min — ABNORMAL LOW (ref >60)
Glucose, Bld: 100 mg/dL — ABNORMAL HIGH (ref 70–99)
Potassium: 4.8 mmol/L (ref 3.5–5.1)
Sodium: 140 mmol/L (ref 135–145)
Total Bilirubin: 2.6 mg/dL — ABNORMAL HIGH (ref 0.3–1.2)
Total Protein: 7.7 g/dL (ref 6.5–8.1)

## 2020-08-14 NOTE — Progress Notes (Signed)
No need for Retacrit today per parameters. Pt notified in lobby. dph

## 2020-08-20 ENCOUNTER — Other Ambulatory Visit: Payer: Self-pay | Admitting: *Deleted

## 2020-08-20 DIAGNOSIS — D4620 Refractory anemia with excess of blasts, unspecified: Secondary | ICD-10-CM

## 2020-08-20 DIAGNOSIS — D461 Refractory anemia with ring sideroblasts: Secondary | ICD-10-CM

## 2020-08-20 DIAGNOSIS — D649 Anemia, unspecified: Secondary | ICD-10-CM

## 2020-08-20 DIAGNOSIS — D518 Other vitamin B12 deficiency anemias: Secondary | ICD-10-CM

## 2020-08-21 ENCOUNTER — Inpatient Hospital Stay: Payer: Medicare Other

## 2020-08-21 ENCOUNTER — Other Ambulatory Visit: Payer: Self-pay

## 2020-08-21 ENCOUNTER — Inpatient Hospital Stay: Payer: Medicare Other | Attending: Hematology & Oncology

## 2020-08-21 DIAGNOSIS — I482 Chronic atrial fibrillation, unspecified: Secondary | ICD-10-CM | POA: Insufficient documentation

## 2020-08-21 DIAGNOSIS — D518 Other vitamin B12 deficiency anemias: Secondary | ICD-10-CM

## 2020-08-21 DIAGNOSIS — D649 Anemia, unspecified: Secondary | ICD-10-CM

## 2020-08-21 DIAGNOSIS — Z79899 Other long term (current) drug therapy: Secondary | ICD-10-CM | POA: Diagnosis not present

## 2020-08-21 DIAGNOSIS — R600 Localized edema: Secondary | ICD-10-CM | POA: Insufficient documentation

## 2020-08-21 DIAGNOSIS — D469 Myelodysplastic syndrome, unspecified: Secondary | ICD-10-CM | POA: Insufficient documentation

## 2020-08-21 DIAGNOSIS — Z96641 Presence of right artificial hip joint: Secondary | ICD-10-CM | POA: Insufficient documentation

## 2020-08-21 DIAGNOSIS — D461 Refractory anemia with ring sideroblasts: Secondary | ICD-10-CM

## 2020-08-21 DIAGNOSIS — D4620 Refractory anemia with excess of blasts, unspecified: Secondary | ICD-10-CM

## 2020-08-21 DIAGNOSIS — I5030 Unspecified diastolic (congestive) heart failure: Secondary | ICD-10-CM | POA: Insufficient documentation

## 2020-08-21 LAB — CBC WITH DIFFERENTIAL (CANCER CENTER ONLY)
Abs Immature Granulocytes: 0.02 10*3/uL (ref 0.00–0.07)
Basophils Absolute: 0.1 10*3/uL (ref 0.0–0.1)
Basophils Relative: 1 %
Eosinophils Absolute: 0.2 10*3/uL (ref 0.0–0.5)
Eosinophils Relative: 3 %
HCT: 35.7 % — ABNORMAL LOW (ref 39.0–52.0)
Hemoglobin: 11.8 g/dL — ABNORMAL LOW (ref 13.0–17.0)
Immature Granulocytes: 0 %
Lymphocytes Relative: 27 %
Lymphs Abs: 1.5 10*3/uL (ref 0.7–4.0)
MCH: 29.5 pg (ref 26.0–34.0)
MCHC: 33.1 g/dL (ref 30.0–36.0)
MCV: 89.3 fL (ref 80.0–100.0)
Monocytes Absolute: 0.5 10*3/uL (ref 0.1–1.0)
Monocytes Relative: 9 %
Neutro Abs: 3.4 10*3/uL (ref 1.7–7.7)
Neutrophils Relative %: 60 %
Platelet Count: 104 10*3/uL — ABNORMAL LOW (ref 150–400)
RBC: 4 MIL/uL — ABNORMAL LOW (ref 4.22–5.81)
RDW: 15 % (ref 11.5–15.5)
WBC Count: 5.7 10*3/uL (ref 4.0–10.5)
nRBC: 0 % (ref 0.0–0.2)

## 2020-08-21 LAB — CMP (CANCER CENTER ONLY)
ALT: 8 U/L (ref 0–44)
AST: 18 U/L (ref 15–41)
Albumin: 4.2 g/dL (ref 3.5–5.0)
Alkaline Phosphatase: 92 U/L (ref 38–126)
Anion gap: 6 (ref 5–15)
BUN: 46 mg/dL — ABNORMAL HIGH (ref 8–23)
CO2: 33 mmol/L — ABNORMAL HIGH (ref 22–32)
Calcium: 10.3 mg/dL (ref 8.9–10.3)
Chloride: 101 mmol/L (ref 98–111)
Creatinine: 2.13 mg/dL — ABNORMAL HIGH (ref 0.61–1.24)
GFR, Estimated: 31 mL/min — ABNORMAL LOW (ref >60)
Glucose, Bld: 84 mg/dL (ref 70–99)
Potassium: 4.6 mmol/L (ref 3.5–5.1)
Sodium: 140 mmol/L (ref 135–145)
Total Bilirubin: 2.5 mg/dL — ABNORMAL HIGH (ref 0.3–1.2)
Total Protein: 7.5 g/dL (ref 6.5–8.1)

## 2020-08-27 NOTE — Progress Notes (Signed)
Cardiology Office Note:    Date:  08/28/2020   ID:  Eric [name], DOB [birth year], MRN [id number]  PCP:  Forrestine Him, PA-C  Cardiologist:  Shirlee More, MD    Referring MD: Forrestine Him, PA-C    ASSESSMENT:    1. Hypertensive heart disease with chronic diastolic congestive heart failure ([place])   2. Permanent atrial fibrillation ([place])   3. Chronic anticoagulation   4. CKD (chronic kidney disease) stage 4, GFR 15-29 ml/min ([name])   5. Low grade myelodysplastic syndrome lesions ([name])   6. Refractory anemia with sideroblasts ([name])   7. Nonrheumatic aortic valve stenosis    PLAN:    In order of problems listed above:  1. Patient is doing well in terms of HTN and diastolic CHF, now stable. BP at goal, continue current therapy. Dry weight is 216 pounds. Recommend that patient weigh himself daily, and can consider increasing to 40 mg of torsemide if above 219 pounds.  2. Stable from atrial fibrillation standpoint, NSR today. Continue eliquis. If patient has persistent epistaxis, can consider decreasing eliquis, as he turns 79 years old in July.  Next appointment: 3 months   Medication Adjustments/Labs and Tests Ordered: Current medicines are reviewed at length with the patient today.  Concerns regarding medicines are outlined above.  No orders of the defined types were placed in this encounter.  No orders of the defined types were placed in this encounter.   No chief complaint on file.   History of Present Illness:    Eric [name] is a 79 y.o. male with a hx of permanent atrial fibrillation, hypertensive heart disease with chronic diastolic heart failure and stage IV CKD.  He was last seen 06/03/2020 with marked fluid overload anasarca and decompensated heart failure.  Other problems include low-grade myelodysplastic syndrome refractory sideroblastic anemia requiring transfusion and mild aortic stenosis.  Review of oncology note 08/07/2020 shows improvement with hemoglobin being  consistently above 10 g%.  Compliance with diet, lifestyle and medications: doing well. Mostly sedentary. Past Medical History:  Diagnosis Date  . Abnormal results of liver function studies 04/24/2014  . Acquired hypothyroidism 04/24/2014  . Acute on chronic diastolic heart failure ([place]) 02/12/2020   5/24-5/31/2021 acute on chronic heart failure in the context of chronic venous insufficiency and acute on CKD.  IV diuresis resulted in output of 6.9 L.  Marland Kitchen AKI (acute kidney injury) ([place]) 06/29/2019  . Anemia   . Atrial fibrillation ([place]) 01/15/2009   Qualifier: Diagnosis of  By: Lynnell Jude RN BSN, [employer] of this note might be different from the original. Overview:  Qualifier: Diagnosis of  By: Lynnell Jude RN BSN, [employer] of this note might be different from the original. Chronic on Pradaxa  . B12 deficiency anemia 09/19/2014  . Benign essential hypertension 02/06/2009   Qualifier: Diagnosis of  By: Hollie Salk CMA, Estill Bamberg    . Benign hypertension with CKD (chronic kidney disease) stage IV ([place]) 06/28/2018  . Bilateral edema of lower extremity 04/06/2018  . Bilateral lower extremity edema   . 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 Bulging lumbar disc 04/24/2014  . Cellulitis of arm 03/21/2014  . Change in stool 05/26/2018  . CHF (congestive heart failure) ([place]) 04/24/2014   Formatting of this note might be different from the original. Overview:  EF 50-55%  Overview:  EF 50-55% Formatting of this note might be different from the original. EF 50-55%  . Chronic atrial fibrillation ([place])   . Chronic coronary artery disease  04/24/2014   Formatting of this note might be different from the original. Overview:  Cath 2003 [facility]. Moderate - LAD 60% Formatting of this note might be different from the original. Cath 2003 [facility]. Moderate - LAD 60%  . Chronic kidney disease, stage 3b ([place]) 06/28/2018  . Colon polyp 07/08/2018  . Current use of long term anticoagulation 10/12/2018  . Degenerative  arthritis of knee, bilateral 06/06/2014  . Diabetes with neurologic complications ([place]) 12/23/100  . Diabetes, polyneuropathy ([place]) 04/24/2014  . Diastolic heart failure ([place])   . DIASTOLIC HEART FAILURE, CHRONIC 02/07/2009   Qualifier: Diagnosis of  By: Caryl Comes, MD, [facility], Mack Guise   Formatting of this note might be different from the original. Overview:  Overview:  Qualifier: Diagnosis of  By: Caryl Comes, MD, [facility], Mack Guise Formatting of this note might be different from the original. Overview:  Qualifier: Diagnosis of  By: Caryl Comes, MD, [name]  . Disorder of kidney and ureter, unspecified 04/24/2014  . Dry skin 08/23/2018  . Elevated liver enzymes 07/09/2018  . Gall bladder disease 2020  . Gallstones 04/24/2014  . Goals of care, counseling/discussion 05/29/2020  . Gout 04/24/2014  . Hearing loss 04/24/2014  . Hip pain 06/06/2014  . History of colon polyps 06/01/2018  . History of right hip replacement    Pt stated 8-9 yrs ago  . Hyperkalemia 07/13/2018  . 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  . Influenza A 11/01/2017  . Internal hemorrhoids 07/08/2018  . Low grade myelodysplastic syndrome lesions ([place]) 05/29/2020  . Nasal vestibulitis 04/24/2014  . Neurocognitive deficits 02/20/2020   SLUMS is a mental status assessment of possible dementia published by the [place]. The test differentiates between high school or less education level in reference to presence or absence of dementia. For high school education a score of 27-30 is normal, 21-26 is minimal neurocognitive deficit and 1-20 suggests dementia. With less than a high school education similar sco  . Obesity 02/06/2009   Qualifier: Diagnosis of  By: Hollie Salk CMA, Monika Salk of this note might be different from the original. Overview:  Qualifier: Diagnosis of  By: Hollie Salk CMA, Estill Bamberg  . Osteoarthritis of hip, unspecified 04/24/2014  . Other and unspecified hyperlipidemia 04/24/2014   . Pain in joint, pelvic region and thigh 04/24/2014  . Peripheral autonomic neuropathy due to diabetes mellitus ([place]) 04/24/2014  . 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 Primary localized osteoarthrosis, pelvic region and thigh 02/25/2013  . Proteinuria 06/28/2018  . Refractory anemia with sideroblasts ([place]) 05/29/2020  . Sacroiliitis ([place]) 02/25/2013  . 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[place]) 02/06/2009   Qualifier: Diagnosis of  By: Hollie Salk CMA, Monika Salk of this note might be different from the original. Formatting of this note might be different from the original. Qualifier: Diagnosis of  By: Hollie Salk CMA, Estill Bamberg   Last Assessment & Plan:  Formatting of this note might be different from the original. 02/12/2020 A1c 8.4% which is adequate control with age & advanced co-morbidities Glucos  . Skin lesion of left leg 03/21/2014  . Tendinitis of right shoulder 07/18/2014  . Thoracic or lumbosacral neuritis or radiculitis, unspecified 04/24/2014   Formatting of this note might be different from the original. Possible right L5 or S1  . Thrombocytopenia ([place]) 02/20/2020   Platelet count varied from 73,000 up to 102,000 as inpatient.  Hematology outpatient consultation on 02/29/2020.   Formatting of this note  might be different from the original. Formatting of this note might be different from the original. Platelet count varied from 73,000 up to 102,000 as inpatient.  Hematology outpatient consultation on 02/29/2020.   Last Assessment & Plan:  Formatting of this note  . Type 2 diabetes mellitus with stage 3b chronic kidney disease, with long-term current use of insulin ([place]) 02/25/2013  . Venous insufficiency 02/19/2020   Formatting of this note might be different from the original. Last Assessment & Plan:  Formatting of this note might be different from the original. Unna boots will be continued at the SNF under the supervision of the wound care nurse.  Outpatient follow-up clinic in the vein clinic will be  arranged. It is anticipated that the ABIs will be reattempted ;results were incomplete during hospitalizatio  . Vitamin B12 deficiency 09/26/2014    Past Surgical History:  Procedure Laterality Date  . APPENDECTOMY    . CHOLECYSTECTOMY    . TOTAL HIP ARTHROPLASTY      Current Medications: Current Meds  Medication Sig  . acetaminophen (TYLENOL) 325 MG tablet Take 650 mg by mouth every 6 (six) hours as needed for moderate pain.   Marland Kitchen allopurinol (ZYLOPRIM) 100 MG tablet Take 100 mg by mouth daily.  Marland Kitchen ELIQUIS 5 MG TABS tablet Take 1 tablet (5 mg total) by mouth 2 (two) times daily. Taking once daily  . insulin glargine, 1 Unit Dial, (TOUJEO SOLOSTAR) 300 UNIT/ML Solostar Pen Inject 26 Units into the skin daily.   . insulin lispro (HUMALOG) 100 UNIT/ML KwikPen Inject 14 Units into the skin 3 (three) times daily.   Marland Kitchen levothyroxine (SYNTHROID) 100 MCG tablet Take 50-100 mcg by mouth See admin instructions. Takes 100 mg daily except Sundays, on Sunday takes 50 mg  . silodosin (RAPAFLO) 8 MG CAPS capsule Take 8 mg by mouth daily.   . simvastatin (ZOCOR) 20 MG tablet Take 1 tablet (20 mg total) by mouth at bedtime.  Marland Kitchen spironolactone (ALDACTONE) 50 MG tablet Take 1 tablet (50 mg total) by mouth daily.  Marland Kitchen torsemide (DEMADEX) 20 MG tablet Take 1 tablet (20 mg total) by mouth in the morning and at bedtime.     Allergies:   Iohexol, Morphine and related, Hydrocodone, Sulfa antibiotics, Sulfonamide derivatives, and Trazodone and nefazodone   Social History   Socioeconomic History  . Marital status: Married    Spouse name: Not on file  . Number of children: Not on file  . Years of education: Not on file  . Highest education level: Not on file  Occupational History  . Not on file  Tobacco Use  . Smoking status: Never Smoker  . Smokeless tobacco: Never Used  Vaping Use  . Vaping Use: Never used  Substance and Sexual Activity  . Alcohol use: Never  . Drug use: Never  . Sexual activity: Yes     Partners: Female  Other Topics Concern  . Not on file  Social History Narrative  . Not on file   Social Determinants of Health   Financial Resource Strain:   . Difficulty of Paying Living Expenses: Not on file  Food Insecurity:   . Worried About Charity fundraiser in the Last Year: Not on file  . Ran Out of Food in the Last Year: Not on file  Transportation Needs:   . Lack of Transportation (Medical): Not on file  . Lack of Transportation (Non-Medical): Not on file  Physical Activity:   . Days of Exercise per Week:  Not on file  . Minutes of Exercise per Session: Not on file  Stress:   . Feeling of Stress : Not on file  Social Connections:   . Frequency of Communication with Friends and Family: Not on file  . Frequency of Social Gatherings with Friends and Family: Not on file  . Attends Religious Services: Not on file  . Active Member of Clubs or Organizations: Not on file  . Attends Archivist Meetings: Not on file  . Marital Status: Not on file     Family History: The patient's family history includes Alzheimer's disease in his father; Congestive Heart Failure in his mother; Heart disease in his mother. ROS:   Please see the history of present illness.    All other systems reviewed and are negative.  EKGs/Labs/Other Studies Reviewed:    The following studies were reviewed today:  EKG:  EKG ordered today and personally reviewed.  The ekg ordered today demonstrates NSR today.  Recent Labs: 04/19/2020: B Natriuretic Peptide 145.1 08/21/2020: ALT 8; BUN 46; Creatinine 2.13; Hemoglobin 11.8; Platelet Count 104; Potassium 4.6; Sodium 140  Recent Lipid Panel No results found for: CHOL, TRIG, HDL, CHOLHDL, VLDL, LDLCALC, LDLDIRECT  Physical Exam:    VS:  BP 124/60   Pulse 78   Ht '5\' 11"'  (1.803 m)   Wt 218 lb (98.9 kg)   SpO2 99%   BMI 30.40 kg/m     Wt Readings from Last 3 Encounters:  08/28/20 218 lb (98.9 kg)  08/07/20 224 lb (101.6 kg)  07/20/20 231  lb 0.6 oz (104.8 kg)     GEN: age-appropriate WM resting comfortably in chair, well nourished, well developed in no acute distress HEENT: Normal NECK: No JVD; No carotid bruits LYMPHATICS: No lymphadenopathy CARDIAC: RRR, no murmurs, rubs, gallops RESPIRATORY:  Clear to auscultation without rales, wheezing or rhonchi  ABDOMEN: Soft, non-tender, non-distended MUSCULOSKELETAL:  Trace edema; No deformity  SKIN: Warm and dry NEUROLOGIC:  Alert and oriented x 3 PSYCHIATRIC:  Normal affect    Signed, Shirlee More, MD  08/28/2020 11:19 AM    No Name

## 2020-08-28 ENCOUNTER — Encounter: Payer: Self-pay | Admitting: Cardiology

## 2020-08-28 ENCOUNTER — Ambulatory Visit (INDEPENDENT_AMBULATORY_CARE_PROVIDER_SITE_OTHER): Payer: Medicare Other | Admitting: Cardiology

## 2020-08-28 ENCOUNTER — Other Ambulatory Visit: Payer: Self-pay

## 2020-08-28 VITALS — BP 124/60 | HR 78 | Ht 71.0 in | Wt 218.0 lb

## 2020-08-28 DIAGNOSIS — I110 Hypertensive heart disease with heart failure: Secondary | ICD-10-CM

## 2020-08-28 DIAGNOSIS — Z7901 Long term (current) use of anticoagulants: Secondary | ICD-10-CM

## 2020-08-28 DIAGNOSIS — D461 Refractory anemia with ring sideroblasts: Secondary | ICD-10-CM

## 2020-08-28 DIAGNOSIS — D4620 Refractory anemia with excess of blasts, unspecified: Secondary | ICD-10-CM

## 2020-08-28 DIAGNOSIS — I5032 Chronic diastolic (congestive) heart failure: Secondary | ICD-10-CM

## 2020-08-28 DIAGNOSIS — I4821 Permanent atrial fibrillation: Secondary | ICD-10-CM

## 2020-08-28 DIAGNOSIS — N184 Chronic kidney disease, stage 4 (severe): Secondary | ICD-10-CM

## 2020-08-28 DIAGNOSIS — I350 Nonrheumatic aortic (valve) stenosis: Secondary | ICD-10-CM

## 2020-08-28 NOTE — Patient Instructions (Signed)
Medication Instructions:  Your physician recommends that you continue on your current medications as directed. Please refer to the Current Medication list given to you today.  *If you need a refill on your cardiac medications before your next appointment, please call your pharmacy*   Lab Work: Your physician recommends that you have a ProBNP completed at the doctors office tomorrow.  If you have labs (blood work) drawn today and your tests are completely normal, you will receive your results only by: Marland Kitchen MyChart Message (if you have MyChart) OR . A paper copy in the mail If you have any lab test that is abnormal or we need to change your treatment, we will call you to review the results.   Testing/Procedures: None   Follow-Up: At [facility], you and your health needs are our priority.  As part of our continuing mission to provide you with exceptional heart care, we have created designated Provider Care Teams.  These Care Teams include your primary Cardiologist (physician) and Advanced Practice Providers (APPs -  Physician Assistants and Nurse Practitioners) who all work together to provide you with the care you need, when you need it.  We recommend signing up for the patient portal called "MyChart".  Sign up information is provided on this After Visit Summary.  MyChart is used to connect with patients for Virtual Visits (Telemedicine).  Patients are able to view lab/test results, encounter notes, upcoming appointments, etc.  Non-urgent messages can be sent to your provider as well.   To learn more about what you can do with MyChart, go to [email or web address].    Your next appointment:   3 month(s)  The format for your next appointment:   In Person  Provider:   Shirlee More, MD   Other Instructions

## 2020-08-29 ENCOUNTER — Inpatient Hospital Stay (HOSPITAL_BASED_OUTPATIENT_CLINIC_OR_DEPARTMENT_OTHER): Payer: Medicare Other | Admitting: Hematology & Oncology

## 2020-08-29 ENCOUNTER — Other Ambulatory Visit: Payer: Self-pay

## 2020-08-29 ENCOUNTER — Telehealth: Payer: Self-pay | Admitting: Cardiology

## 2020-08-29 ENCOUNTER — Inpatient Hospital Stay: Payer: Medicare Other

## 2020-08-29 ENCOUNTER — Encounter: Payer: Self-pay | Admitting: Hematology & Oncology

## 2020-08-29 VITALS — BP 125/64 | HR 76 | Temp 97.6°F | Resp 20 | Ht 71.0 in | Wt 216.8 lb

## 2020-08-29 DIAGNOSIS — D469 Myelodysplastic syndrome, unspecified: Secondary | ICD-10-CM | POA: Diagnosis not present

## 2020-08-29 DIAGNOSIS — D4620 Refractory anemia with excess of blasts, unspecified: Secondary | ICD-10-CM

## 2020-08-29 DIAGNOSIS — D461 Refractory anemia with ring sideroblasts: Secondary | ICD-10-CM

## 2020-08-29 DIAGNOSIS — I5030 Unspecified diastolic (congestive) heart failure: Secondary | ICD-10-CM

## 2020-08-29 LAB — CBC WITH DIFFERENTIAL (CANCER CENTER ONLY)
Abs Immature Granulocytes: 0.01 10*3/uL (ref 0.00–0.07)
Basophils Absolute: 0 10*3/uL (ref 0.0–0.1)
Basophils Relative: 1 %
Eosinophils Absolute: 0.2 10*3/uL (ref 0.0–0.5)
Eosinophils Relative: 5 %
HCT: 33.4 % — ABNORMAL LOW (ref 39.0–52.0)
Hemoglobin: 11.3 g/dL — ABNORMAL LOW (ref 13.0–17.0)
Immature Granulocytes: 0 %
Lymphocytes Relative: 26 %
Lymphs Abs: 1.3 10*3/uL (ref 0.7–4.0)
MCH: 29.9 pg (ref 26.0–34.0)
MCHC: 33.8 g/dL (ref 30.0–36.0)
MCV: 88.4 fL (ref 80.0–100.0)
Monocytes Absolute: 0.5 10*3/uL (ref 0.1–1.0)
Monocytes Relative: 9 %
Neutro Abs: 2.9 10*3/uL (ref 1.7–7.7)
Neutrophils Relative %: 59 %
Platelet Count: 116 10*3/uL — ABNORMAL LOW (ref 150–400)
RBC: 3.78 MIL/uL — ABNORMAL LOW (ref 4.22–5.81)
RDW: 15.5 % (ref 11.5–15.5)
WBC Count: 5 10*3/uL (ref 4.0–10.5)
nRBC: 0 % (ref 0.0–0.2)

## 2020-08-29 LAB — CMP (CANCER CENTER ONLY)
ALT: 9 U/L (ref 0–44)
AST: 15 U/L (ref 15–41)
Albumin: 4.2 g/dL (ref 3.5–5.0)
Alkaline Phosphatase: 86 U/L (ref 38–126)
Anion gap: 7 (ref 5–15)
BUN: 56 mg/dL — ABNORMAL HIGH (ref 8–23)
CO2: 30 mmol/L (ref 22–32)
Calcium: 9.8 mg/dL (ref 8.9–10.3)
Chloride: 102 mmol/L (ref 98–111)
Creatinine: 1.93 mg/dL — ABNORMAL HIGH (ref 0.61–1.24)
GFR, Estimated: 35 mL/min — ABNORMAL LOW (ref >60)
Glucose, Bld: 124 mg/dL — ABNORMAL HIGH (ref 70–99)
Potassium: 4.2 mmol/L (ref 3.5–5.1)
Sodium: 139 mmol/L (ref 135–145)
Total Bilirubin: 2.3 mg/dL — ABNORMAL HIGH (ref 0.3–1.2)
Total Protein: 7.1 g/dL (ref 6.5–8.1)

## 2020-08-29 LAB — RETICULOCYTES
Immature Retic Fract: 13.2 % (ref 2.3–15.9)
RBC.: 3.83 MIL/uL — ABNORMAL LOW (ref 4.22–5.81)
Retic Count, Absolute: 145.5 10*3/uL (ref 19.0–186.0)
Retic Ct Pct: 3.8 % — ABNORMAL HIGH (ref 0.4–3.1)

## 2020-08-29 LAB — LACTATE DEHYDROGENASE: LDH: 200 U/L — ABNORMAL HIGH (ref 98–192)

## 2020-08-29 NOTE — Telephone Encounter (Signed)
Fax sent at this time.  

## 2020-08-29 NOTE — Progress Notes (Signed)
Hematology and Oncology Follow Up Visit  Eric [name] [id number] 28-May-1941 79 y.o. 08/29/2020   Principle Diagnosis:   Myelodysplasia -- Refractory Anemia with Ringed Sideroblast  Current Therapy:    Luspatercept 1.0 mg/m2 sq q 3 week -- start on 07/18/2020  Retacrit 40,000 units sq for Hgb < 11     Interim History:  Eric [name] is back for follow-up.  He really is doing quite well.  He has responded incredibly nicely to the [place] and [place].  I think that we can probably try to pull back on the weekly Retacrit and try to just do Retacrit with Luspatercept if necessary every 3 weeks.  This will make his life a lot easier.  He has is itching.  Not sure why he has little bit of a rash.  He has not had treatment for about a month.  I am unsure if this is from his high BUN.  He does see a nephrologist.  He has had no fever.  He has had no diarrhea.  He has had no issues with cough or shortness of breath.  He has a little bit of leg swelling which is chronic.  His iron studies showed ferritin of 25 with iron saturation of 37% back in November.  He has had no problem with his blood sugars.  They have been a little on the lower side but have been more steady state.  Overall, his performance status right now is ECOG 2.      Medications:  Current Outpatient Medications:  .  acetaminophen (TYLENOL) 325 MG tablet, Take 650 mg by mouth every 6 (six) hours as needed for moderate pain. , Disp: , Rfl:  .  allopurinol (ZYLOPRIM) 100 MG tablet, Take 100 mg by mouth daily., Disp: , Rfl:  .  ELIQUIS 5 MG TABS tablet, Take 1 tablet (5 mg total) by mouth 2 (two) times daily. Taking once daily, Disp: 60 tablet, Rfl: 5 .  insulin glargine, 1 Unit Dial, (TOUJEO SOLOSTAR) 300 UNIT/ML Solostar Pen, Inject 26 Units into the skin daily. , Disp: , Rfl:  .  insulin lispro (HUMALOG) 100 UNIT/ML KwikPen, Inject 14 Units into the skin 3 (three) times daily. , Disp: , Rfl:  .  levothyroxine (SYNTHROID) 100  MCG tablet, Take 50-100 mcg by mouth See admin instructions. Takes 100 mg daily except Sundays, on Sunday takes 50 mg, Disp: , Rfl:  .  silodosin (RAPAFLO) 8 MG CAPS capsule, Take 8 mg by mouth daily. , Disp: , Rfl:  .  simvastatin (ZOCOR) 20 MG tablet, Take 1 tablet (20 mg total) by mouth at bedtime., Disp: 90 tablet, Rfl: 1 .  spironolactone (ALDACTONE) 50 MG tablet, Take 1 tablet (50 mg total) by mouth daily., Disp: 90 tablet, Rfl: 1 .  torsemide (DEMADEX) 20 MG tablet, Take 1 tablet (20 mg total) by mouth in the morning and at bedtime., Disp: 180 tablet, Rfl: 3  Allergies:  Allergies  Allergen Reactions  . Iohexol Other (See Comments)     Desc: "shuts kidneys down"   . Morphine And Related Shortness Of Breath  . Hydrocodone Nausea And Vomiting  . Sulfa Antibiotics Other (See Comments)    UNKNOWN REACTION  . Sulfonamide Derivatives Other (See Comments)    UNKNOWN REACTION  . Trazodone And Nefazodone Rash    Pain    Past Medical History, Surgical history, Social history, and Family History were reviewed and updated.  Review of Systems: Review of Systems  Constitutional: Positive for fatigue.  HENT:  Negative.   Eyes: Negative.   Respiratory: Negative.   Cardiovascular: Positive for leg swelling.  Gastrointestinal: Negative.   Endocrine: Negative.   Genitourinary: Negative.    Musculoskeletal: Negative.   Skin: Negative.   Neurological: Negative.   Hematological: Negative.   Psychiatric/Behavioral: Negative.     Physical Exam:  height is '5\' 11"'  (1.803 m) and weight is 216 lb 12.8 oz (98.3 kg). His oral temperature is 97.6 F (36.4 C). His blood pressure is 125/64 and his pulse is 76. His respiration is 20 and oxygen saturation is 100%.   Wt Readings from Last 3 Encounters:  08/29/20 216 lb 12.8 oz (98.3 kg)  08/28/20 218 lb (98.9 kg)  08/07/20 224 lb (101.6 kg)    Physical Exam Vitals reviewed.  HENT:     Head: Normocephalic and atraumatic.  Eyes:     Pupils:  Pupils are equal, round, and reactive to light.  Cardiovascular:     Rate and Rhythm: Normal rate and regular rhythm.     Heart sounds: Normal heart sounds.  Pulmonary:     Effort: Pulmonary effort is normal.     Breath sounds: Normal breath sounds.  Abdominal:     General: Bowel sounds are normal.     Palpations: Abdomen is soft.  Musculoskeletal:        General: No tenderness or deformity. Normal range of motion.     Cervical back: Normal range of motion.  Lymphadenopathy:     Cervical: No cervical adenopathy.  Skin:    General: Skin is warm and dry.     Findings: No erythema or rash.  Neurological:     Mental Status: He is alert and oriented to person, place, and time.  Psychiatric:        Behavior: Behavior normal.        Thought Content: Thought content normal.        Judgment: Judgment normal.      Lab Results  Component Value Date   WBC 5.0 08/29/2020   HGB 11.3 (L) 08/29/2020   HCT 33.4 (L) 08/29/2020   MCV 88.4 08/29/2020   PLT 116 (L) 08/29/2020     Chemistry      Component Value Date/Time   NA 139 08/29/2020 1359   K 4.2 08/29/2020 1359   CL 102 08/29/2020 1359   CO2 30 08/29/2020 1359   BUN 56 (H) 08/29/2020 1359   CREATININE 1.93 (H) 08/29/2020 1359      Component Value Date/Time   CALCIUM 9.8 08/29/2020 1359   ALKPHOS 86 08/29/2020 1359   AST 15 08/29/2020 1359   ALT 9 08/29/2020 1359   BILITOT 2.3 (H) 08/29/2020 1359      Impression and Plan: Eric [name] is a very nice 79 year old white male.  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  There is no blasts.  I would probably classify him as refractory anemia with ring sideroblasts.    I really am happy that he is responded as I thought he would with the Luspatercept and Retacrit.  We have to watch out for his iron studies.  We will see of the OR today.  Again we will try to move his appointments out to 3 weeks so that he will not need weekly labs or Retacrit.  If he drops too much  with his blood count, then we can certainly get him more sooner with his Retacrit.  I am just happy that he is done nicely.  His quality life is doing pretty well right now.  We will get him back in 3 weeks.     Volanda Napoleon, MD 12/9/20213:41 PM

## 2020-08-29 NOTE — Telephone Encounter (Signed)
Patient requesting a lab order for a proBNP be sent to the [facility], Dr. Antonieta Pert office.

## 2020-08-30 ENCOUNTER — Telehealth: Payer: Self-pay

## 2020-08-30 ENCOUNTER — Encounter: Payer: Self-pay | Admitting: *Deleted

## 2020-08-30 LAB — FERRITIN: Ferritin: 114 ng/mL (ref 24–336)

## 2020-08-30 LAB — IRON AND TIBC
Iron: 48 ug/dL (ref 42–163)
Saturation Ratios: 16 % — ABNORMAL LOW (ref 20–55)
TIBC: 301 ug/dL (ref 202–409)
UIBC: 253 ug/dL (ref 117–376)

## 2020-08-30 LAB — PRO B NATRIURETIC PEPTIDE: NT-Pro BNP: 2136 pg/mL — ABNORMAL HIGH (ref 0–486)

## 2020-08-30 NOTE — Telephone Encounter (Signed)
Per Dr. Bettina Gavia on patients lab work:  Stable no changes in treatment    Spoke with patient regarding results and recommendation.  Patient verbalizes understanding and is agreeable to plan of care. Advised patient to call back with any issues or concerns.

## 2020-09-02 ENCOUNTER — Telehealth: Payer: Self-pay | Admitting: Hematology & Oncology

## 2020-09-02 NOTE — Telephone Encounter (Signed)
Called and advised patient of appts added per 12/10 sch msg

## 2020-09-04 ENCOUNTER — Other Ambulatory Visit: Payer: Self-pay | Admitting: *Deleted

## 2020-09-04 ENCOUNTER — Ambulatory Visit: Payer: Medicare Other

## 2020-09-04 ENCOUNTER — Telehealth: Payer: Self-pay | Admitting: *Deleted

## 2020-09-04 DIAGNOSIS — D649 Anemia, unspecified: Secondary | ICD-10-CM

## 2020-09-04 DIAGNOSIS — D461 Refractory anemia with ring sideroblasts: Secondary | ICD-10-CM

## 2020-09-04 DIAGNOSIS — D4620 Refractory anemia with excess of blasts, unspecified: Secondary | ICD-10-CM

## 2020-09-04 MED ORDER — HYDROXYZINE HCL 25 MG PO TABS: 25.0000 mg | ORAL_TABLET | Freq: Three times a day (TID) | ORAL | 0 refills | Status: DC | PRN

## 2020-09-04 NOTE — Telephone Encounter (Signed)
Call received from patient stating that he will not be able to make it in today for his iron infusion due to intense itching and rash to his upper torso and thighs.  Dr. Marin Olp notified and order received for pt to take Atarax 25 mg PO every eight hours as needed for itching and a dermatology consult.  Pt states that he is leaving to go out of town today and can not see a dermatologist until after 09/19/20.  Dr. Marin Olp notified.

## 2020-09-11 ENCOUNTER — Ambulatory Visit: Payer: Medicare Other

## 2020-09-16 ENCOUNTER — Telehealth: Payer: Self-pay | Admitting: Hematology & Oncology

## 2020-09-16 ENCOUNTER — Telehealth: Payer: Self-pay | Admitting: *Deleted

## 2020-09-16 NOTE — Telephone Encounter (Signed)
Message left to inform pt that [facility] can see him tomorrow, 09/17/20 at 11:30AM.  Pt instructed to arrive at appt at 11:15AM and bring his insurance card and a form of ID per that office's request.  Instructed pt to call this office back with confirmation of call.

## 2020-09-16 NOTE — Telephone Encounter (Signed)
Call received from patient stating that he is back in town earlier than his expected date of 09/19/20 and would like to know when he can be seen by [facility]. Informed pt that I would contact that office and call him back.

## 2020-09-16 NOTE — Telephone Encounter (Signed)
AI called and LMVM for patient regarding appt that has been scheduled for him on 12/28 @ [facility] w/ Phys. Environmental consultant.  I gave him address , phone number and advised hi to bring copy of current insurance card and photo ID along with a list of any medications he was currently talking.  I advised that if he had any questions he would need to call their office @ [phone]

## 2020-09-19 ENCOUNTER — Telehealth: Payer: Self-pay | Admitting: Family

## 2020-09-19 ENCOUNTER — Inpatient Hospital Stay (HOSPITAL_BASED_OUTPATIENT_CLINIC_OR_DEPARTMENT_OTHER): Payer: Medicare Other | Admitting: Family

## 2020-09-19 ENCOUNTER — Inpatient Hospital Stay: Payer: Medicare Other

## 2020-09-19 ENCOUNTER — Encounter: Payer: Self-pay | Admitting: Family

## 2020-09-19 ENCOUNTER — Other Ambulatory Visit: Payer: Self-pay

## 2020-09-19 VITALS — BP 126/63 | HR 82 | Temp 97.8°F | Resp 18 | Ht 71.0 in | Wt 215.1 lb

## 2020-09-19 DIAGNOSIS — D518 Other vitamin B12 deficiency anemias: Secondary | ICD-10-CM

## 2020-09-19 DIAGNOSIS — D461 Refractory anemia with ring sideroblasts: Secondary | ICD-10-CM | POA: Diagnosis not present

## 2020-09-19 DIAGNOSIS — D4620 Refractory anemia with excess of blasts, unspecified: Secondary | ICD-10-CM

## 2020-09-19 DIAGNOSIS — D509 Iron deficiency anemia, unspecified: Secondary | ICD-10-CM | POA: Diagnosis not present

## 2020-09-19 DIAGNOSIS — D469 Myelodysplastic syndrome, unspecified: Secondary | ICD-10-CM | POA: Diagnosis not present

## 2020-09-19 LAB — CBC WITH DIFFERENTIAL (CANCER CENTER ONLY)
Abs Immature Granulocytes: 0 10*3/uL (ref 0.00–0.07)
Basophils Absolute: 0 10*3/uL (ref 0.0–0.1)
Basophils Relative: 1 %
Eosinophils Absolute: 0.2 10*3/uL (ref 0.0–0.5)
Eosinophils Relative: 4 %
HCT: 32.3 % — ABNORMAL LOW (ref 39.0–52.0)
Hemoglobin: 10.8 g/dL — ABNORMAL LOW (ref 13.0–17.0)
Immature Granulocytes: 0 %
Lymphocytes Relative: 32 %
Lymphs Abs: 1.3 10*3/uL (ref 0.7–4.0)
MCH: 29.6 pg (ref 26.0–34.0)
MCHC: 33.4 g/dL (ref 30.0–36.0)
MCV: 88.5 fL (ref 80.0–100.0)
Monocytes Absolute: 0.4 10*3/uL (ref 0.1–1.0)
Monocytes Relative: 9 %
Neutro Abs: 2.3 10*3/uL (ref 1.7–7.7)
Neutrophils Relative %: 54 %
Platelet Count: 93 10*3/uL — ABNORMAL LOW (ref 150–400)
RBC: 3.65 MIL/uL — ABNORMAL LOW (ref 4.22–5.81)
RDW: 15.3 % (ref 11.5–15.5)
WBC Count: 4.1 10*3/uL (ref 4.0–10.5)
nRBC: 0 % (ref 0.0–0.2)

## 2020-09-19 LAB — CMP (CANCER CENTER ONLY)
ALT: 11 U/L (ref 0–44)
AST: 16 U/L (ref 15–41)
Albumin: 4.2 g/dL (ref 3.5–5.0)
Alkaline Phosphatase: 85 U/L (ref 38–126)
Anion gap: 6 (ref 5–15)
BUN: 49 mg/dL — ABNORMAL HIGH (ref 8–23)
CO2: 28 mmol/L (ref 22–32)
Calcium: 9.8 mg/dL (ref 8.9–10.3)
Chloride: 109 mmol/L (ref 98–111)
Creatinine: 1.89 mg/dL — ABNORMAL HIGH (ref 0.61–1.24)
GFR, Estimated: 36 mL/min — ABNORMAL LOW (ref >60)
Glucose, Bld: 91 mg/dL (ref 70–99)
Potassium: 4.8 mmol/L (ref 3.5–5.1)
Sodium: 143 mmol/L (ref 135–145)
Total Bilirubin: 2.2 mg/dL — ABNORMAL HIGH (ref 0.3–1.2)
Total Protein: 7 g/dL (ref 6.5–8.1)

## 2020-09-19 LAB — IRON AND TIBC
Iron: 70 ug/dL (ref 42–163)
Saturation Ratios: 24 % (ref 20–55)
TIBC: 293 ug/dL (ref 202–409)
UIBC: 223 ug/dL (ref 117–376)

## 2020-09-19 LAB — RETICULOCYTES
Immature Retic Fract: 10.8 % (ref 2.3–15.9)
RBC.: 3.74 MIL/uL — ABNORMAL LOW (ref 4.22–5.81)
Retic Count, Absolute: 124.2 10*3/uL (ref 19.0–186.0)
Retic Ct Pct: 3.3 % — ABNORMAL HIGH (ref 0.4–3.1)

## 2020-09-19 LAB — FERRITIN: Ferritin: 82 ng/mL (ref 24–336)

## 2020-09-19 MED ORDER — LUSPATERCEPT-AAMT 75 MG ~~LOC~~ SOLR
100.0000 mg | Freq: Once | SUBCUTANEOUS | Status: AC
  Administered 2020-09-19: 11:00:00 100 mg via SUBCUTANEOUS
  Filled 2020-09-19: qty 1.5

## 2020-09-19 MED ORDER — EPOETIN ALFA-EPBX 40000 UNIT/ML IJ SOLN
40000.0000 [IU] | Freq: Once | INTRAMUSCULAR | Status: AC
  Administered 2020-09-19: 10:00:00 40000 [IU] via SUBCUTANEOUS

## 2020-09-19 NOTE — Patient Instructions (Signed)
Epoetin Alfa injection What is this medicine? EPOETIN ALFA (e POE e tin AL fa) helps your body make more red blood cells. This medicine is used to treat anemia caused by chronic kidney disease, cancer chemotherapy, or HIV-therapy. It may also be used before surgery if you have anemia. This medicine may be used for other purposes; ask your health care provider or pharmacist if you have questions. COMMON BRAND NAME(S): Epogen, Procrit, Retacrit What should I tell my health care provider before I take this medicine? They need to know if you have any of these conditions:  cancer  heart disease  high blood pressure  history of blood clots  history of stroke  low levels of folate, iron, or vitamin B12 in the blood  seizures  an unusual or allergic reaction to erythropoietin, albumin, benzyl alcohol, hamster proteins, other medicines, foods, dyes, or preservatives  pregnant or trying to get pregnant  breast-feeding How should I use this medicine? This medicine is for injection into a vein or under the skin. It is usually given by a health care professional in a hospital or clinic setting. If you get this medicine at home, you will be taught how to prepare and give this medicine. Use exactly as directed. Take your medicine at regular intervals. Do not take your medicine more often than directed. It is important that you put your used needles and syringes in a special sharps container. Do not put them in a trash can. If you do not have a sharps container, call your pharmacist or healthcare provider to get one. A special MedGuide will be given to you by the pharmacist with each prescription and refill. Be sure to read this information carefully each time. Talk to your pediatrician regarding the use of this medicine in children. While this drug may be prescribed for selected conditions, precautions do apply. Overdosage: If you think you have taken too much of this medicine contact a poison  control center or emergency room at once. NOTE: This medicine is only for you. Do not share this medicine with others. What if I miss a dose? If you miss a dose, take it as soon as you can. If it is almost time for your next dose, take only that dose. Do not take double or extra doses. What may interact with this medicine? Interactions have not been studied. This list may not describe all possible interactions. Give your health care provider a list of all the medicines, herbs, non-prescription drugs, or dietary supplements you use. Also tell them if you smoke, drink alcohol, or use illegal drugs. Some items may interact with your medicine. What should I watch for while using this medicine? Your condition will be monitored carefully while you are receiving this medicine. You may need blood work done while you are taking this medicine. This medicine may cause a decrease in vitamin B6. You should make sure that you get enough vitamin B6 while you are taking this medicine. Discuss the foods you eat and the vitamins you take with your health care professional. What side effects may I notice from receiving this medicine? Side effects that you should report to your doctor or health care professional as soon as possible:  allergic reactions like skin rash, itching or hives, swelling of the face, lips, or tongue  seizures  signs and symptoms of a blood clot such as breathing problems; changes in vision; chest pain; severe, sudden headache; pain, swelling, warmth in the leg; trouble speaking; sudden numbness or  weakness of the face, arm or leg  signs and symptoms of a stroke like changes in vision; confusion; trouble speaking or understanding; severe headaches; sudden numbness or weakness of the face, arm or leg; trouble walking; dizziness; loss of balance or coordination Side effects that usually do not require medical attention (report to your doctor or health care professional if they continue or are  bothersome):  chills  cough  dizziness  fever  headaches  joint pain  muscle cramps  muscle pain  nausea, vomiting  pain, redness, or irritation at site where injected This list may not describe all possible side effects. Call your doctor for medical advice about side effects. You may report side effects to FDA at 1-800-FDA-1088. Where should I keep my medicine? Keep out of the reach of children. Store in a refrigerator between 2 and 8 degrees C (36 and 46 degrees F). Do not freeze or shake. Throw away any unused portion if using a single-dose vial. Multi-dose vials can be kept in the refrigerator for up to 21 days after the initial dose. Throw away unused medicine. NOTE: This sheet is a summary. It may not cover all possible information. If you have questions about this medicine, talk to your doctor, pharmacist, or health care provider.  2020 Elsevier/Gold Standard (2017-04-16 08:35:19) Luspatercept injection What is this medicine? LUSPATERCEPT (lus PAT er sept) helps your body make more red blood cells. This medicine is used to treat anemia caused by beta thalassemia or myelodysplastic syndromes. This medicine may be used for other purposes; ask your health care provider or pharmacist if you have questions. COMMON BRAND NAME(S): REBLOZYL What should I tell my health care provider before I take this medicine? They need to know if you have any of these conditions:  cigarette smoker  have had your spleen removed  high blood pressure  history of blood clots  an unusual or allergic reaction to luspatercept, other medicines, foods, dyes or preservatives  pregnant or trying to get pregnant  breast-feeding How should I use this medicine? This medicine is for injection under the skin. It is given by a healthcare professional in a hospital or clinic setting. Talk to your pediatrician about the use of the medicine in children. This medicine is not approved for use in  children. Overdosage: If you think you have taken too much of this medicine contact a poison control center or emergency room at once. NOTE: This medicine is only for you. Do not share this medicine with others. What if I miss a dose? Keep appointments for follow-up doses. It is important not to miss your dose. Call your doctor or healthcare professional if you are unable to keep an appointment. What may interact with this medicine? Interactions are not expected. This list may not describe all possible interactions. Give your health care provider a list of all the medicines, herbs, non-prescription drugs, or dietary supplements you use. Also tell them if you smoke, drink alcohol, or use illegal drugs. Some items may interact with your medicine. What should I watch for while using this medicine? Your condition will be monitored carefully while you are receiving this medicine. Do not become pregnant while taking this medicine or for 3 months after stopping it. Women should inform their healthcare professional if they wish to become pregnant or think they might be pregnant. There is a potential for serious side effects and harm to an unborn child. Talk to your healthcare professional for more information. Do not breast-feed an infant  while taking this medicine. You may need blood work done while you are taking this medicine. What side effects may I notice from receiving this medicine? Side effects that you should report to your doctor or health care professional as soon as possible:  allergic reactions like skin rash, itching or hives; swelling of the face, lips, or tongue  signs and symptoms of a blood clot such as chest pain; shortness of breath; pain, swelling, or warmth in the leg  signs and symptoms of a stroke like changes in vision; confusion; trouble speaking or understanding; severe headaches; sudden numbness or weakness of the face, arm or leg; trouble walking; dizziness; loss of balance or  coordination Side effects that usually do not require medical attention (report these to your doctor or health care professional if they continue or are bothersome):  cough  diarrhea  dizziness  headache  joint pain  stomach pain  tiredness This list may not describe all possible side effects. Call your doctor for medical advice about side effects. You may report side effects to FDA at 1-800-FDA-1088. Where should I keep my medicine? This medicine is given in a hospital or clinic and will not be stored at home. NOTE: This sheet is a summary. It may not cover all possible information. If you have questions about this medicine, talk to your doctor, pharmacist, or health care provider.  2020 Elsevier/Gold Standard (2018-12-27 15:57:59)

## 2020-09-19 NOTE — Telephone Encounter (Signed)
Appointments scheduled calendar printed per 12/30 los 

## 2020-09-19 NOTE — Progress Notes (Signed)
Hematology and Oncology Follow Up Visit  Eric [name] [id number] 1941/02/16 79 y.o. 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 He is doing fairly well b ut still dealing with the sandpaper rash. He was able to see a dermatologist and is using a prescription cream which seems to be helping. He also tried the Mirtazapine yesterday and seemed to tolerate well.  Hgb today is 10.8, platelets 93 and WBC count 4.1.  No fever, chills, n/v, cough, rash, dizziness, SOB, chest pain,  abdominal pain or changes in bowel or bladder habits.  He has occasional palpitations with atrial fib.  He has not noted any abnormal blood loss. He has had a couple of brief nose bleeds with minimal blood loss that stopped on their own without intervention. He does bruise easily on Eric [name]. No petechiae.  He has mild puffiness in his feet and ankles that waxes and wanes. Pedal pulses are 2+.  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  No falls or syncope to report.  He has maintained a good appetite and is staying well hydrated. His weight is stable.  He states that his blood sugars are well controlled. He is drinking a premier protein shake once a day.   ECOG Performance Status: 1 - Symptomatic but completely ambulatory  Medications:  Allergies as of 09/19/2020      Reactions   Iohexol Other (See Comments)    Desc: "shuts kidneys down"   Morphine And Related Shortness Of Breath   Hydrocodone Nausea And Vomiting   Sulfa Antibiotics Other (See Comments)   UNKNOWN REACTION   Sulfonamide Derivatives Other (See Comments)   UNKNOWN REACTION   Trazodone And Nefazodone Rash   Pain      Medication List       Accurate as of September 19, 2020   9:29 AM. If you have any questions, ask your nurse or doctor.        acetaminophen 325 MG tablet Commonly known as: TYLENOL Take 650 mg by mouth every 6 (six) hours as needed for moderate pain.   allopurinol 100 MG tablet Commonly known as: ZYLOPRIM Take 100 mg by mouth daily.   Eric [name] 5 MG Tabs tablet Generic drug: apixaban Take 1 tablet (5 mg total) by mouth 2 (two) times daily. Taking once daily   hydrOXYzine 25 MG tablet Commonly known as: ATARAX/VISTARIL Take 1 tablet (25 mg total) by mouth 3 (three) times daily as needed.   insulin lispro 100 UNIT/ML KwikPen Commonly known as: HUMALOG Inject 14 Units into the skin 3 (three) times daily.   levothyroxine 100 MCG tablet Commonly known as: SYNTHROID Take 50-100 mcg by mouth See admin instructions. Takes 100 mg daily except Sundays, on Sunday takes 50 mg   silodosin 8 MG Caps capsule Commonly known as: RAPAFLO Take 8 mg by mouth daily.   simvastatin 20 MG tablet Commonly known as: ZOCOR Take 1 tablet (20 mg total) by mouth at bedtime.   spironolactone 50 MG tablet Commonly known as: ALDACTONE Take 1 tablet (50 mg total) by mouth daily.   torsemide 20 MG tablet Commonly known as: DEMADEX Take 1 tablet (20 mg total) by mouth in the morning and at bedtime.  Toujeo SoloStar 300 UNIT/ML Solostar Pen Generic drug: insulin glargine (1 Unit Dial) Inject 26 Units into the skin daily.       Allergies:  Allergies  Allergen Reactions  . Iohexol Other (See Comments)     Desc: "shuts kidneys down"   . Morphine And Related Shortness Of Breath  . Hydrocodone Nausea And Vomiting  . Sulfa Antibiotics Other (See Comments)    UNKNOWN REACTION  . Sulfonamide Derivatives Other (See Comments)    UNKNOWN REACTION  . Trazodone And Nefazodone Rash    Pain    Past Medical History, Surgical history, Social history, and Family History were reviewed and updated.  Review of Systems: All other 10 point review of systems is  negative.   Physical Exam:  vitals were not taken for this visit.   Wt Readings from Last 3 Encounters:  08/29/20 216 lb 12.8 oz (98.3 kg)  08/28/20 218 lb (98.9 kg)  08/07/20 224 lb (101.6 kg)    Ocular: Sclerae unicteric, pupils equal, round and reactive to light Ear-nose-throat: Oropharynx clear, dentition fair Lymphatic: No cervical or supraclavicular adenopathy Lungs no rales or rhonchi, good excursion bilaterally Heart regular rate and rhythm, no murmur appreciated Abd soft, nontender, positive bowel sounds MSK no focal spinal tenderness, no joint edema Neuro: non-focal, well-oriented, appropriate affect Breasts: Deferred   Lab Results  Component Value Date   WBC 4.1 09/19/2020   HGB 10.8 (L) 09/19/2020   HCT 32.3 (L) 09/19/2020   MCV 88.5 09/19/2020   PLT 93 (L) 09/19/2020   Lab Results  Component Value Date   FERRITIN 114 08/29/2020   IRON 48 08/29/2020   TIBC 301 08/29/2020   UIBC 253 08/29/2020   IRONPCTSAT 16 (L) 08/29/2020   Lab Results  Component Value Date   RETICCTPCT 3.3 (H) 09/19/2020   RBC 3.74 (L) 09/19/2020   RBC 3.65 (L) 09/19/2020   Lab Results  Component Value Date   KPAFRELGTCHN 49.7 (H) 02/17/2020   LAMBDASER 40.1 (H) 02/17/2020   KAPLAMBRATIO 6.90 04/30/2020   Lab Results  Component Value Date   IGGSERUM 1,267 04/29/2020   IGA 231 04/29/2020   IGMSERUM 36 04/29/2020   Lab Results  Component Value Date   TOTALPROTELP 6.5 04/29/2020   ALBUMINELP 2.9 02/17/2020   A1GS 0.4 02/17/2020   A2GS 0.7 02/17/2020   BETS 0.8 02/17/2020   GAMS 0.7 02/17/2020   MSPIKE 0.3 (H) 02/17/2020   SPEI Comment 02/17/2020     Chemistry      Component Value Date/Time   NA 139 08/29/2020 1359   K 4.2 08/29/2020 1359   CL 102 08/29/2020 1359   CO2 30 08/29/2020 1359   BUN 56 (H) 08/29/2020 1359   CREATININE 1.93 (H) 08/29/2020 1359      Component Value Date/Time   CALCIUM 9.8 08/29/2020 1359   ALKPHOS 86 08/29/2020 1359   AST 15 08/29/2020  1359   ALT 9 08/29/2020 1359   BILITOT 2.3 (H) 08/29/2020 1359       Impression and Plan: Eric [name] is a very pleasant 79 yo caucasian gentleman with myelodysplasia -- refractory anemia with ringed sideroblast.  He has responded nicely to Retacrit ad Luspatercept. He received both injections today as planned for Hgb 10.8.  Follow-up in 3 weeks.  They were encouraged to contacted our office with any questions or concerns.   Laverna Peace, NP 12/30/20219:29 AM

## 2020-10-11 ENCOUNTER — Inpatient Hospital Stay: Payer: Medicare Other

## 2020-10-11 ENCOUNTER — Other Ambulatory Visit: Payer: Self-pay

## 2020-10-11 ENCOUNTER — Inpatient Hospital Stay (HOSPITAL_BASED_OUTPATIENT_CLINIC_OR_DEPARTMENT_OTHER): Payer: Medicare Other | Admitting: Family

## 2020-10-11 ENCOUNTER — Telehealth: Payer: Self-pay | Admitting: Family

## 2020-10-11 ENCOUNTER — Encounter: Payer: Self-pay | Admitting: Family

## 2020-10-11 ENCOUNTER — Inpatient Hospital Stay: Payer: Medicare Other | Attending: Hematology & Oncology

## 2020-10-11 VITALS — BP 142/52 | HR 69 | Temp 97.6°F | Resp 19 | Wt 223.0 lb

## 2020-10-11 DIAGNOSIS — D461 Refractory anemia with ring sideroblasts: Secondary | ICD-10-CM

## 2020-10-11 DIAGNOSIS — D509 Iron deficiency anemia, unspecified: Secondary | ICD-10-CM

## 2020-10-11 DIAGNOSIS — D469 Myelodysplastic syndrome, unspecified: Secondary | ICD-10-CM | POA: Diagnosis not present

## 2020-10-11 DIAGNOSIS — D4620 Refractory anemia with excess of blasts, unspecified: Secondary | ICD-10-CM

## 2020-10-11 LAB — RETICULOCYTES
Immature Retic Fract: 14.7 % (ref 2.3–15.9)
RBC.: 3.97 MIL/uL — ABNORMAL LOW (ref 4.22–5.81)
Retic Count, Absolute: 175.1 K/uL (ref 19.0–186.0)
Retic Ct Pct: 4.4 % — ABNORMAL HIGH (ref 0.4–3.1)

## 2020-10-11 LAB — CBC WITH DIFFERENTIAL (CANCER CENTER ONLY)
Abs Immature Granulocytes: 0.01 10*3/uL (ref 0.00–0.07)
Basophils Absolute: 0.1 10*3/uL (ref 0.0–0.1)
Basophils Relative: 1 %
Eosinophils Absolute: 0.1 10*3/uL (ref 0.0–0.5)
Eosinophils Relative: 3 %
HCT: 34 % — ABNORMAL LOW (ref 39.0–52.0)
Hemoglobin: 11.6 g/dL — ABNORMAL LOW (ref 13.0–17.0)
Immature Granulocytes: 0 %
Lymphocytes Relative: 26 %
Lymphs Abs: 1.1 10*3/uL (ref 0.7–4.0)
MCH: 29.7 pg (ref 26.0–34.0)
MCHC: 34.1 g/dL (ref 30.0–36.0)
MCV: 87 fL (ref 80.0–100.0)
Monocytes Absolute: 0.4 10*3/uL (ref 0.1–1.0)
Monocytes Relative: 9 %
Neutro Abs: 2.5 10*3/uL (ref 1.7–7.7)
Neutrophils Relative %: 61 %
Platelet Count: 105 10*3/uL — ABNORMAL LOW (ref 150–400)
RBC: 3.91 MIL/uL — ABNORMAL LOW (ref 4.22–5.81)
RDW: 15.2 % (ref 11.5–15.5)
WBC Count: 4.2 10*3/uL (ref 4.0–10.5)
nRBC: 0 % (ref 0.0–0.2)

## 2020-10-11 LAB — CMP (CANCER CENTER ONLY)
ALT: 13 U/L (ref 0–44)
AST: 14 U/L — ABNORMAL LOW (ref 15–41)
Albumin: 4.3 g/dL (ref 3.5–5.0)
Alkaline Phosphatase: 99 U/L (ref 38–126)
Anion gap: 6 (ref 5–15)
BUN: 45 mg/dL — ABNORMAL HIGH (ref 8–23)
CO2: 29 mmol/L (ref 22–32)
Calcium: 10.1 mg/dL (ref 8.9–10.3)
Chloride: 105 mmol/L (ref 98–111)
Creatinine: 1.72 mg/dL — ABNORMAL HIGH (ref 0.61–1.24)
GFR, Estimated: 40 mL/min — ABNORMAL LOW (ref >60)
Glucose, Bld: 199 mg/dL — ABNORMAL HIGH (ref 70–99)
Potassium: 5.1 mmol/L (ref 3.5–5.1)
Sodium: 140 mmol/L (ref 135–145)
Total Bilirubin: 2.1 mg/dL — ABNORMAL HIGH (ref 0.3–1.2)
Total Protein: 7.1 g/dL (ref 6.5–8.1)

## 2020-10-11 NOTE — Progress Notes (Signed)
Hematology and Oncology Follow Up Visit  Eric [name] [id number] [birth year] 80 y.o. 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 He is doing well and has no complaints at this time.  Hg  Today is 11.6! Platelets 105 and WBC count 4.2.  No blood loss to report. No abnormal bruising, no petechiae.  No fever, chills, n/v, cough, rash, dizziness, SOB, chest pain, palpitations, abdominal pain or changes in bowel or bladder habits.  He has mild swelling in his ankles and states that he plans to get new compression stockings as the elastic has worn out of this set.  Pedal pulses are 2+.  No tenderness, numbness or tingling in his extremities at this time.  No falls or syncope.  He has maintained a good appetite and is staying well hydrated. His weight is stable at 223 lbs.  ECOG Performance Status: 1 - Symptomatic but completely ambulatory  Medications:  Allergies as of 10/11/2020      Reactions   Iohexol Other (See Comments)    Desc: "shuts kidneys down"   Morphine And Related Shortness Of Breath   Hydrocodone Nausea And Vomiting   Sulfa Antibiotics Other (See Comments)   UNKNOWN REACTION   Sulfonamide Derivatives Other (See Comments)   UNKNOWN REACTION   Trazodone And Nefazodone Rash   Pain      Medication List       Accurate as of October 11, 2020 10:12 AM. If you have any questions, ask your nurse or doctor.        acetaminophen 325 MG tablet Commonly known as: TYLENOL Take 650 mg by mouth every 6 (six) hours as needed for moderate pain.   allopurinol 100 MG tablet Commonly known as: ZYLOPRIM Take 100 mg by mouth daily.   Eliquis 5 MG Tabs tablet Generic drug: apixaban Take 1 tablet (5 mg total) by mouth 2 (two) times  daily. Taking once daily   insulin lispro 100 UNIT/ML KwikPen Commonly known as: HUMALOG Inject 14 Units into the skin 3 (three) times daily.   levothyroxine 100 MCG tablet Commonly known as: SYNTHROID Take 50-100 mcg by mouth See admin instructions. Takes 100 mg daily except Sundays, on Sunday takes 50 mg   mirtazapine 15 MG tablet Commonly known as: REMERON Take 15 mg by mouth at bedtime.   silodosin 8 MG Caps capsule Commonly known as: RAPAFLO Take 8 mg by mouth daily.   simvastatin 20 MG tablet Commonly known as: ZOCOR Take 1 tablet (20 mg total) by mouth at bedtime.   spironolactone 50 MG tablet Commonly known as: ALDACTONE Take 1 tablet (50 mg total) by mouth daily.   torsemide 20 MG tablet Commonly known as: DEMADEX Take 1 tablet (20 mg total) by mouth in the morning and at bedtime.   Toujeo SoloStar 300 UNIT/ML Solostar Pen Generic drug: insulin glargine (1 Unit Dial) Inject 26 Units into the skin daily.       Allergies:  Allergies  Allergen Reactions   Iohexol Other (See Comments)     Desc: "shuts kidneys down"    Morphine And Related Shortness Of Breath   Hydrocodone Nausea And Vomiting   Sulfa Antibiotics Other (See Comments)    UNKNOWN REACTION   Sulfonamide Derivatives Other (See Comments)  UNKNOWN REACTION   Trazodone And Nefazodone Rash    Pain    Past Medical History, Surgical history, Social history, and Family History were reviewed and updated.  Review of Systems: All other 10 point review of systems is negative.   Physical Exam:  vitals were not taken for this visit.   Wt Readings from Last 3 Encounters:  09/19/20 215 lb 1.3 oz (97.6 kg)  08/29/20 216 lb 12.8 oz (98.3 kg)  08/28/20 218 lb (98.9 kg)    Ocular: Sclerae unicteric, pupils equal, round and reactive to light Ear-nose-throat: Oropharynx clear, dentition fair Lymphatic: No cervical or supraclavicular adenopathy Lungs no rales or rhonchi, good excursion  bilaterally Heart regular rate and rhythm, no murmur appreciated Abd soft, nontender, positive bowel sounds MSK no focal spinal tenderness, no joint edema Neuro: non-focal, well-oriented, appropriate affect Breasts: Deferred   Lab Results  Component Value Date   WBC 4.2 10/11/2020   HGB 11.6 (L) 10/11/2020   HCT 34.0 (L) 10/11/2020   MCV 87.0 10/11/2020   PLT 105 (L) 10/11/2020   Lab Results  Component Value Date   FERRITIN 82 09/19/2020   IRON 70 09/19/2020   TIBC 293 09/19/2020   UIBC 223 09/19/2020   IRONPCTSAT 24 09/19/2020   Lab Results  Component Value Date   RETICCTPCT 4.4 (H) 10/11/2020   RBC 3.91 (L) 10/11/2020   RBC 3.97 (L) 10/11/2020   Lab Results  Component Value Date   KPAFRELGTCHN 49.7 (H) 02/17/2020   LAMBDASER 40.1 (H) 02/17/2020   KAPLAMBRATIO 6.90 04/30/2020   Lab Results  Component Value Date   IGGSERUM 1,267 04/29/2020   IGA 231 04/29/2020   IGMSERUM 36 04/29/2020   Lab Results  Component Value Date   TOTALPROTELP 6.5 04/29/2020   ALBUMINELP 2.9 02/17/2020   A1GS 0.4 02/17/2020   A2GS 0.7 02/17/2020   BETS 0.8 02/17/2020   GAMS 0.7 02/17/2020   MSPIKE 0.3 (H) 02/17/2020   SPEI Comment 02/17/2020     Chemistry      Component Value Date/Time   NA 143 09/19/2020 0911   K 4.8 09/19/2020 0911   CL 109 09/19/2020 0911   CO2 28 09/19/2020 0911   BUN 49 (H) 09/19/2020 0911   CREATININE 1.89 (H) 09/19/2020 0911      Component Value Date/Time   CALCIUM 9.8 09/19/2020 0911   ALKPHOS 85 09/19/2020 0911   AST 16 09/19/2020 0911   ALT 11 09/19/2020 0911   BILITOT 2.2 (H) 09/19/2020 0911       Impression and Plan: Eric [name] is a very pleasant 80 yo caucasian gentleman with myelodysplasia -- refractory anemia with ringed sideroblast.  Hgb today is 11.6. No injections needed this visit.  We will see him again in 3 weeks.  They can contact our office with any questions or concerns. We can certainly see him sooner if needed.   Laverna Peace, NP 1/21/202210:12 AM

## 2020-10-11 NOTE — Telephone Encounter (Signed)
Appointments scheduled and secure chat sent to provider to advise patient per 1/21 los

## 2020-10-14 LAB — IRON AND TIBC
Iron: 73 ug/dL (ref 42–163)
Saturation Ratios: 23 % (ref 20–55)
TIBC: 316 ug/dL (ref 202–409)
UIBC: 243 ug/dL (ref 117–376)

## 2020-10-14 LAB — FERRITIN: Ferritin: 82 ng/mL (ref 24–336)

## 2020-10-31 ENCOUNTER — Inpatient Hospital Stay: Payer: Medicare Other

## 2020-10-31 ENCOUNTER — Inpatient Hospital Stay: Payer: Medicare Other | Attending: Hematology & Oncology

## 2020-10-31 ENCOUNTER — Inpatient Hospital Stay (HOSPITAL_BASED_OUTPATIENT_CLINIC_OR_DEPARTMENT_OTHER): Payer: Medicare Other | Admitting: Family

## 2020-10-31 ENCOUNTER — Encounter: Payer: Self-pay | Admitting: Family

## 2020-10-31 ENCOUNTER — Other Ambulatory Visit: Payer: Self-pay

## 2020-10-31 VITALS — BP 152/64 | HR 79 | Temp 97.7°F | Resp 20 | Wt 223.0 lb

## 2020-10-31 DIAGNOSIS — R21 Rash and other nonspecific skin eruption: Secondary | ICD-10-CM | POA: Diagnosis not present

## 2020-10-31 DIAGNOSIS — Z79899 Other long term (current) drug therapy: Secondary | ICD-10-CM | POA: Insufficient documentation

## 2020-10-31 DIAGNOSIS — Z794 Long term (current) use of insulin: Secondary | ICD-10-CM | POA: Diagnosis not present

## 2020-10-31 DIAGNOSIS — D509 Iron deficiency anemia, unspecified: Secondary | ICD-10-CM

## 2020-10-31 DIAGNOSIS — D461 Refractory anemia with ring sideroblasts: Secondary | ICD-10-CM

## 2020-10-31 DIAGNOSIS — D518 Other vitamin B12 deficiency anemias: Secondary | ICD-10-CM

## 2020-10-31 DIAGNOSIS — D4620 Refractory anemia with excess of blasts, unspecified: Secondary | ICD-10-CM

## 2020-10-31 LAB — CMP (CANCER CENTER ONLY)
ALT: 13 U/L (ref 0–44)
AST: 15 U/L (ref 15–41)
Albumin: 4.4 g/dL (ref 3.5–5.0)
Alkaline Phosphatase: 90 U/L (ref 38–126)
Anion gap: 7 (ref 5–15)
BUN: 51 mg/dL — ABNORMAL HIGH (ref 8–23)
CO2: 29 mmol/L (ref 22–32)
Calcium: 10 mg/dL (ref 8.9–10.3)
Chloride: 106 mmol/L (ref 98–111)
Creatinine: 1.84 mg/dL — ABNORMAL HIGH (ref 0.61–1.24)
GFR, Estimated: 37 mL/min — ABNORMAL LOW
Glucose, Bld: 154 mg/dL — ABNORMAL HIGH (ref 70–99)
Potassium: 5 mmol/L (ref 3.5–5.1)
Sodium: 142 mmol/L (ref 135–145)
Total Bilirubin: 3 mg/dL — ABNORMAL HIGH (ref 0.3–1.2)
Total Protein: 6.8 g/dL (ref 6.5–8.1)

## 2020-10-31 LAB — CBC WITH DIFFERENTIAL (CANCER CENTER ONLY)
Abs Immature Granulocytes: 0.01 10*3/uL (ref 0.00–0.07)
Basophils Absolute: 0 10*3/uL (ref 0.0–0.1)
Basophils Relative: 1 %
Eosinophils Absolute: 0.1 10*3/uL (ref 0.0–0.5)
Eosinophils Relative: 2 %
HCT: 30.4 % — ABNORMAL LOW (ref 39.0–52.0)
Hemoglobin: 10.5 g/dL — ABNORMAL LOW (ref 13.0–17.0)
Immature Granulocytes: 0 %
Lymphocytes Relative: 24 %
Lymphs Abs: 1 10*3/uL (ref 0.7–4.0)
MCH: 30.1 pg (ref 26.0–34.0)
MCHC: 34.5 g/dL (ref 30.0–36.0)
MCV: 87.1 fL (ref 80.0–100.0)
Monocytes Absolute: 0.3 10*3/uL (ref 0.1–1.0)
Monocytes Relative: 8 %
Neutro Abs: 2.7 10*3/uL (ref 1.7–7.7)
Neutrophils Relative %: 65 %
Platelet Count: 91 10*3/uL — ABNORMAL LOW (ref 150–400)
RBC: 3.49 MIL/uL — ABNORMAL LOW (ref 4.22–5.81)
RDW: 15.1 % (ref 11.5–15.5)
WBC Count: 4.2 10*3/uL (ref 4.0–10.5)
nRBC: 0 % (ref 0.0–0.2)

## 2020-10-31 LAB — RETICULOCYTES
Immature Retic Fract: 12.2 % (ref 2.3–15.9)
RBC.: 3.45 MIL/uL — ABNORMAL LOW (ref 4.22–5.81)
Retic Count, Absolute: 139.4 10*3/uL (ref 19.0–186.0)
Retic Ct Pct: 4 % — ABNORMAL HIGH (ref 0.4–3.1)

## 2020-10-31 MED ORDER — EPOETIN ALFA-EPBX 40000 UNIT/ML IJ SOLN
40000.0000 [IU] | Freq: Once | INTRAMUSCULAR | Status: AC
  Administered 2020-10-31: 40000 [IU] via SUBCUTANEOUS

## 2020-10-31 MED ORDER — EPOETIN ALFA-EPBX 40000 UNIT/ML IJ SOLN
INTRAMUSCULAR | Status: AC
  Filled 2020-10-31: qty 1

## 2020-10-31 MED ORDER — LUSPATERCEPT-AAMT 75 MG ~~LOC~~ SOLR
100.0000 mg | Freq: Once | SUBCUTANEOUS | Status: AC
  Administered 2020-10-31: 100 mg via SUBCUTANEOUS
  Filled 2020-10-31: qty 0.5

## 2020-10-31 NOTE — Progress Notes (Signed)
Hematology and Oncology Follow Up Visit  Eric [name] [id number] November 25, 1940 80 y.o. 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 Hgb is 10.5, MCV 87, platelets 91 and WBC count 4.2.  He still has the itchy rash on his arms and chest. His skin appears a little dry at this time. No redness or edema noted on exam.  He states that he has had several small "lumps" appear over the last month or so on the face, head and back which have removed by dermatology. He states that these are felt to be a side effect of the Luspatercept. I did follow-up with our pharmacist and Eric [name]. This is not a side effect that we have encountered so far but will continue to monitor.  No fever, chills, n/v, cough, SOB, abdominal pain or changes in bowel or bladder habits.  He varies between constipation and diarrhea.  He states that he has had a couple episodes of chest pain radiating across the entire chest but not radiating down his arm. He states that he has an appointment coming up next month with cardiology. He was encouraged to follow-up with their office sooner if this continues to occur.  Chronic lower extremity swelling is stable. He wears compression stockings regularly.  The numbness and tingling in his feet and left pinky and ring fingers is unchanged/stable.  He has occasional episodes of dizziness. He does not want to use his walker of cane at this time.  Thankfully, he has not had any falls or syncope.   He has not noted any blood loss on Eliquis. No petechiae noted.  He has a good appetite and is counting carbs and portions in an effort to help lose weight. He is doing his best to stay well hydrated. His weight is stable at 223 lbs.   ECOG Performance  Status: 1 - Symptomatic but completely ambulatory  Medications:  Allergies as of 10/31/2020      Reactions   Iohexol Other (See Comments)    Desc: "shuts kidneys down"   Morphine And Related Shortness Of Breath   Hydrocodone Nausea And Vomiting   Sulfa Antibiotics Other (See Comments)   UNKNOWN REACTION   Sulfonamide Derivatives Other (See Comments)   UNKNOWN REACTION   Trazodone Rash   Pain   Trazodone And Nefazodone Rash   Pain      Medication List       Accurate as of October 31, 2020  2:15 PM. If you have any questions, ask your nurse or doctor.        acetaminophen 325 MG tablet Commonly known as: TYLENOL Take 650 mg by mouth every 6 (six) hours as needed for moderate pain.   allopurinol 100 MG tablet Commonly known as: ZYLOPRIM Take 100 mg by mouth daily.   cyproheptadine 4 MG tablet Commonly known as: PERIACTIN Take 4 mg by mouth at bedtime.   Eliquis 5 MG Tabs tablet Generic drug: apixaban Take 1 tablet (5 mg total) by mouth 2 (two) times daily. Taking once daily   insulin lispro 100 UNIT/ML KwikPen Commonly known as: HUMALOG Inject 14 Units into the skin 3 (three) times daily.   levothyroxine 100 MCG tablet Commonly known as: SYNTHROID Take 50-100 mcg by mouth See admin instructions. Takes 100 mg  daily except Sundays, on Sunday takes 50 mg   mirtazapine 15 MG tablet Commonly known as: REMERON Take 15 mg by mouth at bedtime.   silodosin 8 MG Caps capsule Commonly known as: RAPAFLO Take 8 mg by mouth daily.   simvastatin 20 MG tablet Commonly known as: ZOCOR Take 1 tablet (20 mg total) by mouth at bedtime.   spironolactone 50 MG tablet Commonly known as: ALDACTONE Take 1 tablet (50 mg total) by mouth daily.   temazepam 7.5 MG capsule Commonly known as: RESTORIL Take 7.5 mg by mouth at bedtime.   torsemide 20 MG tablet Commonly known as: DEMADEX Take 1 tablet (20 mg total) by mouth in the morning and at bedtime.   Toujeo SoloStar 300  UNIT/ML Solostar Pen Generic drug: insulin glargine (1 Unit Dial) Inject 26 Units into the skin daily.   triamcinolone 0.1 % Commonly known as: KENALOG   triamcinolone 0.1 % Commonly known as: KENALOG Apply topically.       Allergies:  Allergies  Allergen Reactions  . Iohexol Other (See Comments)     Desc: "shuts kidneys down"   . Morphine And Related Shortness Of Breath  . Hydrocodone Nausea And Vomiting  . Sulfa Antibiotics Other (See Comments)    UNKNOWN REACTION  . Sulfonamide Derivatives Other (See Comments)    UNKNOWN REACTION  . Trazodone Rash    Pain  . Trazodone And Nefazodone Rash    Pain    Past Medical History, Surgical history, Social history, and Family History were reviewed and updated.  Review of Systems: All other 10 point review of systems is negative.   Physical Exam:  vitals were not taken for this visit.   Wt Readings from Last 3 Encounters:  10/11/20 223 lb (101.2 kg)  09/19/20 215 lb 1.3 oz (97.6 kg)  08/29/20 216 lb 12.8 oz (98.3 kg)    Ocular: Sclerae unicteric, pupils equal, round and reactive to light Ear-nose-throat: Oropharynx clear, dentition fair Lymphatic: No cervical or supraclavicular adenopathy Lungs no rales or rhonchi, good excursion bilaterally Heart regular rate and rhythm, no murmur appreciated Abd soft, nontender, positive bowel sounds MSK no focal spinal tenderness, no joint edema Neuro: non-focal, well-oriented, appropriate affect Breasts: Deferred   Lab Results  Component Value Date   WBC 4.2 10/11/2020   HGB 11.6 (L) 10/11/2020   HCT 34.0 (L) 10/11/2020   MCV 87.0 10/11/2020   PLT 105 (L) 10/11/2020   Lab Results  Component Value Date   FERRITIN 82 10/11/2020   IRON 73 10/11/2020   TIBC 316 10/11/2020   UIBC 243 10/11/2020   IRONPCTSAT 23 10/11/2020   Lab Results  Component Value Date   RETICCTPCT 4.4 (H) 10/11/2020   RBC 3.91 (L) 10/11/2020   RBC 3.97 (L) 10/11/2020   Lab Results  Component  Value Date   KPAFRELGTCHN 49.7 (H) 02/17/2020   LAMBDASER 40.1 (H) 02/17/2020   KAPLAMBRATIO 6.90 04/30/2020   Lab Results  Component Value Date   IGGSERUM 1,267 04/29/2020   IGA 231 04/29/2020   IGMSERUM 36 04/29/2020   Lab Results  Component Value Date   TOTALPROTELP 6.5 04/29/2020   ALBUMINELP 2.9 02/17/2020   A1GS 0.4 02/17/2020   A2GS 0.7 02/17/2020   BETS 0.8 02/17/2020   GAMS 0.7 02/17/2020   MSPIKE 0.3 (H) 02/17/2020   SPEI Comment 02/17/2020     Chemistry      Component Value Date/Time   NA 140 10/11/2020 0954   K 5.1 10/11/2020 0954  CL 105 10/11/2020 0954   CO2 29 10/11/2020 0954   BUN 45 (H) 10/11/2020 0954   CREATININE 1.72 (H) 10/11/2020 0954      Component Value Date/Time   CALCIUM 10.1 10/11/2020 0954   ALKPHOS 99 10/11/2020 0954   AST 14 (L) 10/11/2020 0954   ALT 13 10/11/2020 0954   BILITOT 2.1 (H) 10/11/2020 0954       Impression and Plan: Mr. Manus is a very pleasant 80 yo caucasian gentleman with myelodysplasia --refractoryanemia withringedsideroblast.  Ok to proceed with Luspatercept and Retacrit per MD. Patient agreeable.  MD follow-up in 3 weeks.  They will contact our office with any questions or concerns. We can certainly see him sooner if needed.   Laverna Peace, NP 2/10/20222:15 PM

## 2020-11-01 ENCOUNTER — Telehealth: Payer: Self-pay | Admitting: Family

## 2020-11-01 NOTE — Telephone Encounter (Signed)
Appointments scheduled calendar printed & mailed per 2/10 los 

## 2020-11-19 NOTE — Progress Notes (Signed)
Cardiology Office Note:    Date:  11/20/2020   ID:  Eric [name], DOB 12-02-1940, MRN [id number]  PCP:  Eric Him, PA-C  Cardiologist:  Eric More, MD    Referring MD: Eric Him, PA-C    ASSESSMENT:    1. Hypertensive heart disease with chronic diastolic congestive heart failure ([place])   2. Permanent atrial fibrillation (Eric [name])   3. Chronic anticoagulation   4. CKD (chronic kidney disease) stage 4, GFR 15-29 ml/min ([name])   5. Low grade myelodysplastic syndrome lesions ([name])    PLAN:    In order of problems listed above:  1. He is mildly fluid overloaded increase his diuretic.  BP at target on current treatment including his diuretics. 2. Stable rate is controlled continue his anticoagulant 3. Continue his anticoagulant he has had no bleeding complication 4. Stable far stable most recently 37 cc stage III 5. Stable managed by hematology   Next appointment: 6 months   Medication Adjustments/Labs and Tests Ordered: Current medicines are reviewed at length with the patient today.  Concerns regarding medicines are outlined above.  No orders of the defined types were placed in this encounter.  No orders of the defined types were placed in this encounter.   Chief Complaint  Patient presents with  . Follow-up  . Congestive Heart Failure         History of Present Illness:    Eric [name] is a 80 y.o. male with a hx of permanent atrial fibrillation hypertensive heart disease with chronic diastolic heart failure and stage IV CKD, myelodysplastic syndrome with refractory sideroblastic anemia and mild aortic stenosis last seen 08/28/2020.  At his last visit he was markedly improved with compensated heart failure was in sinus rhythm and improved with his myelodysplastic syndrome stable no longer requiring transfusions with a recent hemoglobin 10/11/2020 of 11.6 creatinine 1.84 GFR 37 cc potassium 5.0. Compliance with diet, lifestyle and medications: Yes  Overall he is done  well although recently became anemic again he has noticed increased peripheral edema intermittently takes a double dose of his diuretic.  He has 1-2+ edema we decided to take it twice daily on Monday Wednesdays and Fridays.  He has not been short of breath no orthopnea chest pain palpitation or syncope.  He tolerates his anticoagulant without bleeding and statin without muscle pain or weakness. Past Medical History:  Diagnosis Date  . Abnormal results of liver function studies 04/24/2014  . Acquired hypothyroidism 04/24/2014  . Acute on chronic diastolic heart failure (Eric [name]) 02/12/2020   5/24-5/31/2021 acute on chronic heart failure in the context of chronic venous insufficiency and acute on CKD.  IV diuresis resulted in output of 6.9 L.  Eric [name] AKI (acute kidney injury) ([place]) 06/29/2019  . Anemia   . Atrial fibrillation ([place]) 01/15/2009   Qualifier: Diagnosis of  By: Eric Jude RN BSN, [employer] of this note might be different from the original. Overview:  Qualifier: Diagnosis of  By: Eric Jude RN BSN, [employer] of this note might be different from the original. Chronic on Pradaxa  . B12 deficiency anemia 09/19/2014  . Benign essential hypertension 02/06/2009   Qualifier: Diagnosis of  By: Eric [name] CMA, Eric [name]    . Benign hypertension with CKD (chronic kidney disease) stage IV (Eric [name]) 06/28/2018  . Bilateral edema of lower extremity 04/06/2018  . Bilateral lower extremity edema   . 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 Bulging lumbar disc 04/24/2014  . Cellulitis of arm 03/21/2014  . Change  in stool 05/26/2018  . CHF (congestive heart failure) ([place]) 04/24/2014   Formatting of this note might be different from the original. Overview:  EF 50-55%  Overview:  EF 50-55% Formatting of this note might be different from the original. EF 50-55%  . Chronic atrial fibrillation (Eric [name])   . Chronic coronary artery disease 04/24/2014   Formatting of this note might be different from the original. Overview:  Cath 2003  Eric [name]. Moderate - LAD 60% Formatting of this note might be different from the original. Cath 2003 [name]. Moderate - LAD 60%  . Chronic kidney disease, stage 3b (Eric [name]) 06/28/2018  . Colon polyp 07/08/2018  . Current use of long term anticoagulation 10/12/2018  . Degenerative arthritis of knee, bilateral 06/06/2014  . Diabetes with neurologic complications (Eric [name]) 03/22/946  . Diabetes, polyneuropathy (Eric [name]) 04/24/2014  . Diastolic heart failure (Eric [name])   . DIASTOLIC HEART FAILURE, CHRONIC 02/07/2009   Qualifier: Diagnosis of  By: Eric Comes, MD, Eric [name], Eric [name]   Formatting of this note might be different from the original. Overview:  Overview:  Qualifier: Diagnosis of  By: Eric Comes, MD, [facility], Eric [name] Formatting of this note might be different from the original. Overview:  Qualifier: Diagnosis of  By: Eric Comes, MD, Eric [name]  . Disorder of kidney and ureter, unspecified 04/24/2014  . Dry skin 08/23/2018  . Elevated liver enzymes 07/09/2018  . Gall bladder disease 2020  . Gallstones 04/24/2014  . Goals of care, counseling/discussion 05/29/2020  . Gout 04/24/2014  . Hearing loss 04/24/2014  . Hip pain 06/06/2014  . History of colon polyps 06/01/2018  . History of right hip replacement    Pt stated 8-9 yrs ago  . Hyperkalemia 07/13/2018  . 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  . Influenza A 11/01/2017  . Internal hemorrhoids 07/08/2018  . Low grade myelodysplastic syndrome lesions ([place]) 05/29/2020  . Nasal vestibulitis 04/24/2014  . Neurocognitive deficits 02/20/2020   SLUMS is a mental status assessment of possible dementia published by the [employer]. The test differentiates between high school or less education level in reference to presence or absence of dementia. For high school education a score of 27-30 is normal, 21-26 is minimal neurocognitive deficit and 1-20 suggests dementia. With less than a high school education similar sco  .  Obesity 02/06/2009   Qualifier: Diagnosis of  By: Eric [name] CMA, Monika [name] of this note might be different from the original. Overview:  Qualifier: Diagnosis of  By: Eric [name] CMA, Eric [name]  . Osteoarthritis of hip, unspecified 04/24/2014  . Other and unspecified hyperlipidemia 04/24/2014  . Pain in joint, pelvic region and thigh 04/24/2014  . Peripheral autonomic neuropathy due to diabetes mellitus ([place]) 04/24/2014  . 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 Primary localized osteoarthrosis, pelvic region and thigh 02/25/2013  . Proteinuria 06/28/2018  . Refractory anemia with sideroblasts ([place]) 05/29/2020  . Sacroiliitis ([place]) 02/25/2013  . 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[place]) 02/06/2009   Qualifier: Diagnosis of  By: Eric [name] CMA, Monika [name] of this note might be different from the original. Formatting of this note might be different from the original. Qualifier: Diagnosis of  By: Eric [name] CMA, Eric [name]   Last Assessment & Plan:  Formatting of this note might be different from the original. 02/12/2020 A1c 8.4% which is adequate control with age & advanced co-morbidities Glucos  . Skin lesion of left leg 03/21/2014  . Tendinitis of right shoulder  07/18/2014  . Thoracic or lumbosacral neuritis or radiculitis, unspecified 04/24/2014   Formatting of this note might be different from the original. Possible right L5 or S1  . Thrombocytopenia ([place]) 02/20/2020   Platelet count varied from 73,000 up to 102,000 as inpatient.  Hematology outpatient consultation on 02/29/2020.   Formatting of this note might be different from the original. Formatting of this note might be different from the original. Platelet count varied from 73,000 up to 102,000 as inpatient.  Hematology outpatient consultation on 02/29/2020.   Last Assessment & Plan:  Formatting of this note  . Type 2 diabetes mellitus with stage 3b chronic kidney disease, with long-term current use of insulin ([place]) 02/25/2013  . Venous insufficiency 02/19/2020    Formatting of this note might be different from the original. Last Assessment & Plan:  Formatting of this note might be different from the original. Unna boots will be continued at the SNF under the supervision of the wound care nurse.  Outpatient follow-up clinic in the vein clinic will be arranged. It is anticipated that the ABIs will be reattempted ;results were incomplete during hospitalizatio  . Vitamin B12 deficiency 09/26/2014    Past Surgical History:  Procedure Laterality Date  . APPENDECTOMY    . CHOLECYSTECTOMY    . TOTAL HIP ARTHROPLASTY      Current Medications: No outpatient medications have been marked as taking for the 11/20/20 encounter (Office Visit) with Richardo Priest, MD.     Allergies:   Iohexol, Morphine and related, Hydrocodone, Sulfa antibiotics, Sulfonamide derivatives, Trazodone, and Trazodone and nefazodone   Social History   Socioeconomic History  . Marital status: Married    Spouse name: Not on file  . Number of children: Not on file  . Years of education: Not on file  . Highest education level: Not on file  Occupational History  . Not on file  Tobacco Use  . Smoking status: Never Smoker  . Smokeless tobacco: Never Used  Vaping Use  . Vaping Use: Never used  Substance and Sexual Activity  . Alcohol use: Never  . Drug use: Never  . Sexual activity: Yes    Partners: Female  Other Topics Concern  . Not on file  Social History Narrative  . Not on file   Social Determinants of Health   Financial Resource Strain: Not on file  Food Insecurity: Not on file  Transportation Needs: Not on file  Physical Activity: Not on file  Stress: Not on file  Social Connections: Not on file     Family History: The patient's family history includes Alzheimer's disease in his father; Congestive Heart Failure in his mother; Heart disease in his mother. ROS:   Please see the history of present illness.    All other systems reviewed and are  negative.  EKGs/Labs/Other Studies Reviewed:    The following studies were reviewed today:    Recent Labs: 04/19/2020: B Natriuretic Peptide 145.1 08/29/2020: NT-Pro BNP 2,136 10/31/2020: ALT 13; BUN 51; Creatinine 1.84; Hemoglobin 10.5; Platelet Count 91; Potassium 5.0; Sodium 142  Recent Lipid Panel No results found for: CHOL, TRIG, HDL, CHOLHDL, VLDL, LDLCALC, LDLDIRECT  Physical Exam:    VS:  BP 120/72 (BP Location: Left Arm, Patient Position: Sitting)   Pulse 83   Ht '5\' 11"'  (1.803 m)   Wt 226 lb 0.1 oz (102.5 kg)   SpO2 98%   BMI 31.52 kg/m     Wt Readings from Last 3 Encounters:  11/20/20 226  lb 0.1 oz (102.5 kg)  10/31/20 223 lb (101.2 kg)  10/11/20 223 lb (101.2 kg)     GEN:  Well nourished, well developed in no acute distress HEENT: Normal NECK: Mild JVD; No carotid bruits LYMPHATICS: No lymphadenopathy CARDIAC: Irregular rhythm variable first heart sound RRR, no murmurs, rubs, gallops RESPIRATORY:  Clear to auscultation without rales, wheezing or rhonchi  ABDOMEN: Soft, non-tender, non-distended MUSCULOSKELETAL: 2+ bilateral lower extremity pitting edema; No deformity stasis changes SKIN: Warm and dry NEUROLOGIC:  Alert and oriented x 3 PSYCHIATRIC:  Normal affect    Signed, Eric More, MD  11/20/2020 12:08 PM    [place]

## 2020-11-20 ENCOUNTER — Ambulatory Visit (INDEPENDENT_AMBULATORY_CARE_PROVIDER_SITE_OTHER): Payer: Medicare Other | Admitting: Cardiology

## 2020-11-20 ENCOUNTER — Other Ambulatory Visit: Payer: Self-pay

## 2020-11-20 VITALS — BP 120/72 | HR 83 | Ht 71.0 in | Wt 226.0 lb

## 2020-11-20 DIAGNOSIS — Z7901 Long term (current) use of anticoagulants: Secondary | ICD-10-CM

## 2020-11-20 DIAGNOSIS — I4821 Permanent atrial fibrillation: Secondary | ICD-10-CM | POA: Diagnosis not present

## 2020-11-20 DIAGNOSIS — I110 Hypertensive heart disease with heart failure: Secondary | ICD-10-CM | POA: Diagnosis not present

## 2020-11-20 DIAGNOSIS — N184 Chronic kidney disease, stage 4 (severe): Secondary | ICD-10-CM

## 2020-11-20 DIAGNOSIS — I5032 Chronic diastolic (congestive) heart failure: Secondary | ICD-10-CM

## 2020-11-20 DIAGNOSIS — D4620 Refractory anemia with excess of blasts, unspecified: Secondary | ICD-10-CM

## 2020-11-20 NOTE — Patient Instructions (Signed)
Medication Instructions:  Your physician has recommended you make the following change in your medication: Taka an extra Torsemide on Monday, Wednesday and Friday  *If you need a refill on your cardiac medications before your next appointment, please call your pharmacy*   Lab Work: None  If you have labs (blood work) drawn today and your tests are completely normal, you will receive your results only by: Marland Kitchen MyChart Message (if you have MyChart) OR . A paper copy in the mail If you have any lab test that is abnormal or we need to change your treatment, we will call you to review the results.   Testing/Procedures: None   Follow-Up: At [facility], you and your health needs are our priority.  As part of our continuing mission to provide you with exceptional heart care, we have created designated Provider Care Teams.  These Care Teams include your primary Cardiologist (physician) and Advanced Practice Providers (APPs -  Physician Assistants and Nurse Practitioners) who all work together to provide you with the care you need, when you need it.  We recommend signing up for the patient portal called "MyChart".  Sign up information is provided on this After Visit Summary.  MyChart is used to connect with patients for Virtual Visits (Telemedicine).  Patients are able to view lab/test results, encounter notes, upcoming appointments, etc.  Non-urgent messages can be sent to your provider as well.   To learn more about what you can do with MyChart, go to [email or web address].    Your next appointment:   6 month(s)  The format for your next appointment:   In Person  Provider:   Shirlee More, MD   Other Instructions

## 2020-11-21 ENCOUNTER — Inpatient Hospital Stay: Payer: Medicare Other | Attending: Hematology & Oncology

## 2020-11-21 ENCOUNTER — Encounter: Payer: Self-pay | Admitting: Hematology & Oncology

## 2020-11-21 ENCOUNTER — Inpatient Hospital Stay: Payer: Medicare Other

## 2020-11-21 ENCOUNTER — Inpatient Hospital Stay (HOSPITAL_BASED_OUTPATIENT_CLINIC_OR_DEPARTMENT_OTHER): Payer: Medicare Other | Admitting: Hematology & Oncology

## 2020-11-21 VITALS — BP 144/67 | HR 77 | Temp 97.8°F | Resp 20 | Wt 228.4 lb

## 2020-11-21 DIAGNOSIS — D4620 Refractory anemia with excess of blasts, unspecified: Secondary | ICD-10-CM | POA: Diagnosis not present

## 2020-11-21 DIAGNOSIS — D461 Refractory anemia with ring sideroblasts: Secondary | ICD-10-CM

## 2020-11-21 DIAGNOSIS — E119 Type 2 diabetes mellitus without complications: Secondary | ICD-10-CM | POA: Insufficient documentation

## 2020-11-21 DIAGNOSIS — Z79899 Other long term (current) drug therapy: Secondary | ICD-10-CM | POA: Diagnosis not present

## 2020-11-21 DIAGNOSIS — D509 Iron deficiency anemia, unspecified: Secondary | ICD-10-CM

## 2020-11-21 LAB — CMP (CANCER CENTER ONLY)
ALT: 10 U/L (ref 0–44)
AST: 13 U/L — ABNORMAL LOW (ref 15–41)
Albumin: 4.4 g/dL (ref 3.5–5.0)
Alkaline Phosphatase: 95 U/L (ref 38–126)
Anion gap: 7 (ref 5–15)
BUN: 46 mg/dL — ABNORMAL HIGH (ref 8–23)
CO2: 30 mmol/L (ref 22–32)
Calcium: 9.9 mg/dL (ref 8.9–10.3)
Chloride: 103 mmol/L (ref 98–111)
Creatinine: 1.95 mg/dL — ABNORMAL HIGH (ref 0.61–1.24)
GFR, Estimated: 34 mL/min — ABNORMAL LOW (ref >60)
Glucose, Bld: 286 mg/dL — ABNORMAL HIGH (ref 70–99)
Potassium: 5.3 mmol/L — ABNORMAL HIGH (ref 3.5–5.1)
Sodium: 140 mmol/L (ref 135–145)
Total Bilirubin: 2.7 mg/dL — ABNORMAL HIGH (ref 0.3–1.2)
Total Protein: 6.6 g/dL (ref 6.5–8.1)

## 2020-11-21 LAB — RETICULOCYTES
Immature Retic Fract: 15.5 % (ref 2.3–15.9)
RBC.: 3.63 MIL/uL — ABNORMAL LOW (ref 4.22–5.81)
Retic Count, Absolute: 193.8 10*3/uL — ABNORMAL HIGH (ref 19.0–186.0)
Retic Ct Pct: 5.3 % — ABNORMAL HIGH (ref 0.4–3.1)

## 2020-11-21 LAB — CBC WITH DIFFERENTIAL (CANCER CENTER ONLY)
Abs Immature Granulocytes: 0.01 10*3/uL (ref 0.00–0.07)
Basophils Absolute: 0 10*3/uL (ref 0.0–0.1)
Basophils Relative: 1 %
Eosinophils Absolute: 0.1 10*3/uL (ref 0.0–0.5)
Eosinophils Relative: 3 %
HCT: 32.4 % — ABNORMAL LOW (ref 39.0–52.0)
Hemoglobin: 11 g/dL — ABNORMAL LOW (ref 13.0–17.0)
Immature Granulocytes: 0 %
Lymphocytes Relative: 23 %
Lymphs Abs: 1.1 10*3/uL (ref 0.7–4.0)
MCH: 30 pg (ref 26.0–34.0)
MCHC: 34 g/dL (ref 30.0–36.0)
MCV: 88.3 fL (ref 80.0–100.0)
Monocytes Absolute: 0.4 10*3/uL (ref 0.1–1.0)
Monocytes Relative: 8 %
Neutro Abs: 3.1 10*3/uL (ref 1.7–7.7)
Neutrophils Relative %: 65 %
Platelet Count: 101 10*3/uL — ABNORMAL LOW (ref 150–400)
RBC: 3.67 MIL/uL — ABNORMAL LOW (ref 4.22–5.81)
RDW: 15.8 % — ABNORMAL HIGH (ref 11.5–15.5)
WBC Count: 4.7 10*3/uL (ref 4.0–10.5)
nRBC: 0 % (ref 0.0–0.2)

## 2020-11-21 MED ORDER — LUSPATERCEPT-AAMT 75 MG ~~LOC~~ SOLR
100.0000 mg | Freq: Once | SUBCUTANEOUS | Status: AC
  Administered 2020-11-21: 100 mg via SUBCUTANEOUS
  Filled 2020-11-21: qty 1.5

## 2020-11-21 NOTE — Progress Notes (Signed)
Hematology and Oncology Follow Up Visit  Eric [name] [id number] 05-10-1941 80 y.o. 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  He has responded nicely to the [place].  He is not been transfused for several months.  He still has some "lumps" on his body.  I am unsure exactly what this is.  Apparently, the dermatologist who sees him says this is from his Luspatercept.  I suppose might be where he gets injected.  He has had no problems with bleeding.  There is no issues with iron deficiency.  His last iron studies showed a ferritin of 82 and iron saturation of 23%.  He does feel stiff.  He does have a little bit of difficult time walking.  He does have diabetes.  Blood sugar is horrible today.  Overall, I would say his performance status is probably ECOG 2, at best.     Medications:  Allergies as of 11/21/2020      Reactions   Iohexol Other (See Comments)    Desc: "shuts kidneys down"   Morphine And Related Shortness Of Breath   Hydrocodone Nausea And Vomiting   Sulfa Antibiotics Other (See Comments)   UNKNOWN REACTION   Sulfonamide Derivatives Other (See Comments)   UNKNOWN REACTION   Trazodone Rash   Pain   Trazodone And Nefazodone Rash   Pain      Medication List       Accurate as of November 21, 2020  1:13 PM. If you have any questions, ask your nurse or doctor.        acetaminophen 325 MG tablet Commonly known as: TYLENOL Take 650 mg by mouth every 6 (six) hours as needed for moderate pain.   allopurinol 100 MG tablet Commonly known as: ZYLOPRIM Take 100 mg by mouth daily.   Eliquis 5 MG Tabs tablet Generic drug: apixaban Take 1 tablet (5 mg total) by mouth 2 (two) times daily. Taking once daily What  changed: additional instructions   fluocinonide 0.05 % external solution Commonly known as: LIDEX Apply topically. PRN for rash   insulin lispro 100 UNIT/ML KwikPen Commonly known as: HUMALOG Inject 9 Units into the skin 3 (three) times daily.   levothyroxine 100 MCG tablet Commonly known as: SYNTHROID Take 50-100 mcg by mouth See admin instructions. Takes 100 mg daily except Sundays, on Sunday takes 50 mg   OneTouch Delica Lancets 91M Misc 4 (four) times daily.   silodosin 8 MG Caps capsule Commonly known as: RAPAFLO Take 8 mg by mouth daily.   simvastatin 20 MG tablet Commonly known as: ZOCOR Take 1 tablet (20 mg total) by mouth at bedtime.   spironolactone 50 MG tablet Commonly known as: ALDACTONE Take 1 tablet (50 mg total) by mouth daily.   torsemide 20 MG tablet Commonly known as: DEMADEX Take 1 tablet (20 mg total) by mouth in the morning and at bedtime.   Toujeo SoloStar 300 UNIT/ML Solostar Pen Generic drug: insulin glargine (1 Unit Dial) Inject 26 Units into the skin daily. Sliding scale   triamcinolone 0.1 % Commonly known as: KENALOG Apply 1 application topically as needed. PRN for rash   triamcinolone 0.1 % Commonly known as: KENALOG Apply topically.       Allergies:  Allergies  Allergen Reactions  .  Iohexol Other (See Comments)     Desc: "shuts kidneys down"   . Morphine And Related Shortness Of Breath  . Hydrocodone Nausea And Vomiting  . Sulfa Antibiotics Other (See Comments)    UNKNOWN REACTION  . Sulfonamide Derivatives Other (See Comments)    UNKNOWN REACTION  . Trazodone Rash    Pain  . Trazodone And Nefazodone Rash    Pain    Past Medical History, Surgical history, Social history, and Family History were reviewed and updated.  Review of Systems: Review of Systems  Constitutional: Positive for malaise/fatigue.  HENT: Negative.   Eyes: Negative.   Respiratory: Positive for shortness of breath.   Cardiovascular: Positive for  palpitations and leg swelling.  Gastrointestinal: Negative.   Genitourinary: Negative.   Musculoskeletal: Positive for joint pain and myalgias.  Skin: Positive for rash.  Neurological: Positive for focal weakness.  Endo/Heme/Allergies: Negative.   Psychiatric/Behavioral: Negative.      Physical Exam:  weight is 228 lb 6.4 oz (103.6 kg). His oral temperature is 97.8 F (36.6 C). His blood pressure is 144/67 (abnormal) and his pulse is 77. His respiration is 20 and oxygen saturation is 100%.   Wt Readings from Last 3 Encounters:  11/21/20 228 lb 6.4 oz (103.6 kg)  11/20/20 226 lb 0.1 oz (102.5 kg)  10/31/20 223 lb (101.2 kg)    Physical Exam Vitals reviewed.  HENT:     Head: Normocephalic and atraumatic.     Mouth/Throat:     Mouth: Oropharynx is clear and moist.  Eyes:     Extraocular Movements: EOM normal.     Pupils: Pupils are equal, round, and reactive to light.  Cardiovascular:     Rate and Rhythm: Normal rate and regular rhythm.     Heart sounds: Normal heart sounds.  Pulmonary:     Effort: Pulmonary effort is normal.     Breath sounds: Normal breath sounds.  Abdominal:     General: Bowel sounds are normal.     Palpations: Abdomen is soft.  Musculoskeletal:        General: No tenderness, deformity or edema. Normal range of motion.     Cervical back: Normal range of motion.  Lymphadenopathy:     Cervical: No cervical adenopathy.  Skin:    General: Skin is warm and dry.     Findings: No erythema or rash.  Neurological:     Mental Status: He is alert and oriented to person, place, and time.  Psychiatric:        Mood and Affect: Mood and affect normal.        Behavior: Behavior normal.        Thought Content: Thought content normal.        Judgment: Judgment normal.      Lab Results  Component Value Date   WBC 4.7 11/21/2020   HGB 11.0 (L) 11/21/2020   HCT 32.4 (L) 11/21/2020   MCV 88.3 11/21/2020   PLT 101 (L) 11/21/2020   Lab Results  Component  Value Date   FERRITIN 82 10/11/2020   IRON 73 10/11/2020   TIBC 316 10/11/2020   UIBC 243 10/11/2020   IRONPCTSAT 23 10/11/2020   Lab Results  Component Value Date   RETICCTPCT 5.3 (H) 11/21/2020   RBC 3.63 (L) 11/21/2020   Lab Results  Component Value Date   KPAFRELGTCHN 49.7 (H) 02/17/2020   LAMBDASER 40.1 (H) 02/17/2020   KAPLAMBRATIO 6.90 04/30/2020   Lab Results  Component Value Date  IGGSERUM 1,267 04/29/2020   IGA 231 04/29/2020   IGMSERUM 36 04/29/2020   Lab Results  Component Value Date   TOTALPROTELP 6.5 04/29/2020   ALBUMINELP 2.9 02/17/2020   A1GS 0.4 02/17/2020   A2GS 0.7 02/17/2020   BETS 0.8 02/17/2020   GAMS 0.7 02/17/2020   MSPIKE 0.3 (H) 02/17/2020   SPEI Comment 02/17/2020     Chemistry      Component Value Date/Time   NA 140 11/21/2020 1141   K 5.3 (H) 11/21/2020 1141   CL 103 11/21/2020 1141   CO2 30 11/21/2020 1141   BUN 46 (H) 11/21/2020 1141   CREATININE 1.95 (H) 11/21/2020 1141      Component Value Date/Time   CALCIUM 9.9 11/21/2020 1141   ALKPHOS 95 11/21/2020 1141   AST 13 (L) 11/21/2020 1141   ALT 10 11/21/2020 1141   BILITOT 2.7 (H) 11/21/2020 1141       Impression and Plan: Mr. Weekly is a very pleasant 80 yo caucasian gentleman with myelodysplasia --refractoryanemia withringedsideroblast.   I think that we can move his Luspatercept out now to 4 weeks.  I think he is doing pretty well with the Luspatercept and we can try to stretch his appointments out little bit longer.  He does not need any Retacrit.  Again, we will get him back in 1 month.  Volanda Napoleon, MD 3/3/20221:13 PM

## 2020-11-21 NOTE — Patient Instructions (Signed)
Luspatercept injection What is this medicine? LUSPATERCEPT (lus PAT er sept) helps your body make more red blood cells. This medicine is used to treat anemia caused by beta thalassemia or myelodysplastic syndromes. This medicine may be used for other purposes; ask your health care provider or pharmacist if you have questions. COMMON BRAND NAME(S): REBLOZYL What should I tell my health care provider before I take this medicine? They need to know if you have any of these conditions:  cigarette smoker  have had your spleen removed  high blood pressure  history of blood clots  an unusual or allergic reaction to luspatercept, other medicines, foods, dyes or preservatives  pregnant or trying to get pregnant  breast-feeding How should I use this medicine? This medicine is for injection under the skin. It is given by a healthcare professional in a hospital or clinic setting. Talk to your pediatrician about the use of the medicine in children. This medicine is not approved for use in children. Overdosage: If you think you have taken too much of this medicine contact a poison control center or emergency room at once. NOTE: This medicine is only for you. Do not share this medicine with others. What if I miss a dose? Keep appointments for follow-up doses. It is important not to miss your dose. Call your doctor or healthcare professional if you are unable to keep an appointment. What may interact with this medicine? Interactions are not expected. This list may not describe all possible interactions. Give your health care provider a list of all the medicines, herbs, non-prescription drugs, or dietary supplements you use. Also tell them if you smoke, drink alcohol, or use illegal drugs. Some items may interact with your medicine. What should I watch for while using this medicine? Your condition will be monitored carefully while you are receiving this medicine. Do not become pregnant while taking  this medicine or for 3 months after stopping it. Women should inform their healthcare professional if they wish to become pregnant or think they might be pregnant. There is a potential for serious side effects and harm to an unborn child. Talk to your healthcare professional for more information. Do not breast-feed an infant while taking this medicine. You may need blood work done while you are taking this medicine. What side effects may I notice from receiving this medicine? Side effects that you should report to your doctor or health care professional as soon as possible:  allergic reactions like skin rash, itching or hives; swelling of the face, lips, or tongue  signs and symptoms of a blood clot such as chest pain; shortness of breath; pain, swelling, or warmth in the leg  signs and symptoms of a stroke like changes in vision; confusion; trouble speaking or understanding; severe headaches; sudden numbness or weakness of the face, arm or leg; trouble walking; dizziness; loss of balance or coordination Side effects that usually do not require medical attention (report these to your doctor or health care professional if they continue or are bothersome):  cough  diarrhea  dizziness  headache  joint pain  stomach pain  tiredness This list may not describe all possible side effects. Call your doctor for medical advice about side effects. You may report side effects to FDA at 1-800-FDA-1088. Where should I keep my medicine? This medicine is given in a hospital or clinic and will not be stored at home. NOTE: This sheet is a summary. It may not cover all possible information. If you have questions about   this medicine, talk to your doctor, pharmacist, or health care provider.  2021 Elsevier/Gold Standard (2018-12-27 15:57:59)

## 2020-11-22 LAB — IRON AND TIBC
Iron: 68 ug/dL (ref 42–163)
Saturation Ratios: 20 % (ref 20–55)
TIBC: 335 ug/dL (ref 202–409)
UIBC: 267 ug/dL (ref 117–376)

## 2020-11-22 LAB — FERRITIN: Ferritin: 59 ng/mL (ref 24–336)

## 2020-12-06 ENCOUNTER — Other Ambulatory Visit: Payer: Self-pay | Admitting: Cardiology

## 2020-12-19 ENCOUNTER — Inpatient Hospital Stay: Payer: Medicare Other

## 2020-12-19 ENCOUNTER — Telehealth: Payer: Self-pay

## 2020-12-19 ENCOUNTER — Other Ambulatory Visit: Payer: Self-pay

## 2020-12-19 ENCOUNTER — Inpatient Hospital Stay (HOSPITAL_BASED_OUTPATIENT_CLINIC_OR_DEPARTMENT_OTHER): Payer: Medicare Other | Admitting: Hematology & Oncology

## 2020-12-19 VITALS — BP 120/73 | HR 72 | Temp 98.0°F | Resp 20 | Wt 228.0 lb

## 2020-12-19 DIAGNOSIS — D461 Refractory anemia with ring sideroblasts: Secondary | ICD-10-CM

## 2020-12-19 DIAGNOSIS — D4620 Refractory anemia with excess of blasts, unspecified: Secondary | ICD-10-CM

## 2020-12-19 LAB — CBC WITH DIFFERENTIAL (CANCER CENTER ONLY)
Abs Immature Granulocytes: 0.07 10*3/uL (ref 0.00–0.07)
Basophils Absolute: 0.1 10*3/uL (ref 0.0–0.1)
Basophils Relative: 1 %
Eosinophils Absolute: 0.1 10*3/uL (ref 0.0–0.5)
Eosinophils Relative: 3 %
HCT: 33.8 % — ABNORMAL LOW (ref 39.0–52.0)
Hemoglobin: 11.4 g/dL — ABNORMAL LOW (ref 13.0–17.0)
Immature Granulocytes: 1 %
Lymphocytes Relative: 28 %
Lymphs Abs: 1.4 10*3/uL (ref 0.7–4.0)
MCH: 29.8 pg (ref 26.0–34.0)
MCHC: 33.7 g/dL (ref 30.0–36.0)
MCV: 88.5 fL (ref 80.0–100.0)
Monocytes Absolute: 0.5 10*3/uL (ref 0.1–1.0)
Monocytes Relative: 9 %
Neutro Abs: 3 10*3/uL (ref 1.7–7.7)
Neutrophils Relative %: 58 %
Platelet Count: 93 10*3/uL — ABNORMAL LOW (ref 150–400)
RBC: 3.82 MIL/uL — ABNORMAL LOW (ref 4.22–5.81)
RDW: 15.9 % — ABNORMAL HIGH (ref 11.5–15.5)
WBC Count: 5.2 10*3/uL (ref 4.0–10.5)
nRBC: 0 % (ref 0.0–0.2)

## 2020-12-19 LAB — CMP (CANCER CENTER ONLY)
ALT: 11 U/L (ref 0–44)
AST: 15 U/L (ref 15–41)
Albumin: 4.2 g/dL (ref 3.5–5.0)
Alkaline Phosphatase: 82 U/L (ref 38–126)
Anion gap: 6 (ref 5–15)
BUN: 43 mg/dL — ABNORMAL HIGH (ref 8–23)
CO2: 29 mmol/L (ref 22–32)
Calcium: 9.7 mg/dL (ref 8.9–10.3)
Chloride: 106 mmol/L (ref 98–111)
Creatinine: 1.64 mg/dL — ABNORMAL HIGH (ref 0.61–1.24)
GFR, Estimated: 42 mL/min — ABNORMAL LOW (ref >60)
Glucose, Bld: 161 mg/dL — ABNORMAL HIGH (ref 70–99)
Potassium: 4.8 mmol/L (ref 3.5–5.1)
Sodium: 141 mmol/L (ref 135–145)
Total Bilirubin: 2.8 mg/dL — ABNORMAL HIGH (ref 0.3–1.2)
Total Protein: 6.7 g/dL (ref 6.5–8.1)

## 2020-12-19 LAB — RETICULOCYTES
Immature Retic Fract: 17.6 % — ABNORMAL HIGH (ref 2.3–15.9)
RBC.: 3.76 MIL/uL — ABNORMAL LOW (ref 4.22–5.81)
Retic Count, Absolute: 204.5 10*3/uL — ABNORMAL HIGH (ref 19.0–186.0)
Retic Ct Pct: 5.4 % — ABNORMAL HIGH (ref 0.4–3.1)

## 2020-12-19 MED ORDER — LUSPATERCEPT-AAMT 75 MG ~~LOC~~ SOLR
100.0000 mg | Freq: Once | SUBCUTANEOUS | Status: AC
Start: 2020-12-19 — End: 2020-12-19
  Administered 2020-12-19: 100 mg via SUBCUTANEOUS
  Filled 2020-12-19: qty 1.5

## 2020-12-19 NOTE — Progress Notes (Signed)
Hematology and Oncology Follow Up Visit  Eric [name] [id number] [birth year] 80 y.o. 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 is here today for follow-up.  He actually looks quite good.  He feels pretty good.  He really has had no specific complaints when we last saw him.  The Luspatercept is helping quite nicely.  His hemoglobin is slowly trending upward which is wonderful to see.  His iron studies have been doing okay.  Back in early March, his ferritin was 59 with an iron saturation of 20%.    There is been no issues with cough or shortness of breath.  He has had no change in bowel or bladder habits.  There has been some chronic swelling in his legs.  He does have blood sugar issues.  His blood sugars were not as bad today.  He does have renal insufficiency.  As such, we have go on Retacrit which does seem to help.  Currently, his performance status is ECOG 2.    Medications:  Allergies as of 12/19/2020      Reactions   Iohexol Other (See Comments)    Desc: "shuts kidneys down"   Morphine And Related Shortness Of Breath   Hydrocodone Nausea And Vomiting   Sulfa Antibiotics Other (See Comments)   UNKNOWN REACTION   Sulfonamide Derivatives Other (See Comments)   UNKNOWN REACTION   Trazodone Rash   Pain   Trazodone And Nefazodone Rash   Pain      Medication List       Accurate as of December 19, 2020  2:46 PM. If you have any questions, ask your nurse or doctor.        acetaminophen 325 MG tablet Commonly known as: TYLENOL Take 650 mg by mouth every 6 (six) hours as needed for moderate pain.   allopurinol 100 MG tablet Commonly known as: ZYLOPRIM Take 100 mg by mouth daily.   Eliquis 5 MG Tabs tablet Generic drug: apixaban Take 1 tablet (5 mg total) by  mouth 2 (two) times daily. Taking once daily What changed: additional instructions   fluocinonide 0.05 % external solution Commonly known as: LIDEX Apply topically. PRN for rash   insulin lispro 100 UNIT/ML KwikPen Commonly known as: HUMALOG Inject 9 Units into the skin 3 (three) times daily.   levothyroxine 100 MCG tablet Commonly known as: SYNTHROID Take 50-100 mcg by mouth See admin instructions. Takes 100 mg daily except Sundays, on Sunday takes 50 mg   OneTouch Delica Lancets 25E Misc 4 (four) times daily.   silodosin 8 MG Caps capsule Commonly known as: RAPAFLO Take 8 mg by mouth daily.   simvastatin 20 MG tablet Commonly known as: ZOCOR TAKE 1 TABLET BY MOUTH AT  BEDTIME   spironolactone 50 MG tablet Commonly known as: ALDACTONE TAKE 1 TABLET BY MOUTH  DAILY   torsemide 20 MG tablet Commonly known as: DEMADEX Take 1 tablet (20 mg total) by mouth in the morning and at bedtime.   Toujeo SoloStar 300 UNIT/ML Solostar Pen Generic drug: insulin glargine (1 Unit Dial) Inject 26 Units into the skin daily. Sliding scale   triamcinolone 0.1 % Commonly known as: KENALOG Apply 1 application topically as needed. PRN for rash   triamcinolone 0.1 % Commonly known as: KENALOG Apply topically.  Allergies:  Allergies  Allergen Reactions  . Iohexol Other (See Comments)     Desc: "shuts kidneys down"   . Morphine And Related Shortness Of Breath  . Hydrocodone Nausea And Vomiting  . Sulfa Antibiotics Other (See Comments)    UNKNOWN REACTION  . Sulfonamide Derivatives Other (See Comments)    UNKNOWN REACTION  . Trazodone Rash    Pain  . Trazodone And Nefazodone Rash    Pain    Past Medical History, Surgical history, Social history, and Family History were reviewed and updated.  Review of Systems: Review of Systems  Constitutional: Positive for malaise/fatigue.  HENT: Negative.   Eyes: Negative.   Respiratory: Positive for shortness of breath.    Cardiovascular: Positive for palpitations and leg swelling.  Gastrointestinal: Negative.   Genitourinary: Negative.   Musculoskeletal: Positive for joint pain and myalgias.  Skin: Positive for rash.  Neurological: Positive for focal weakness.  Endo/Heme/Allergies: Negative.   Psychiatric/Behavioral: Negative.      Physical Exam:  weight is 228 lb (103.4 kg). His oral temperature is 98 F (36.7 C). His blood pressure is 120/73 and his pulse is 72. His respiration is 20 and oxygen saturation is 100%.   Wt Readings from Last 3 Encounters:  12/19/20 228 lb (103.4 kg)  11/21/20 228 lb 6.4 oz (103.6 kg)  11/20/20 226 lb 0.1 oz (102.5 kg)    Physical Exam Vitals reviewed.  HENT:     Head: Normocephalic and atraumatic.  Eyes:     Pupils: Pupils are equal, round, and reactive to light.  Cardiovascular:     Rate and Rhythm: Normal rate and regular rhythm.     Heart sounds: Normal heart sounds.  Pulmonary:     Effort: Pulmonary effort is normal.     Breath sounds: Normal breath sounds.  Abdominal:     General: Bowel sounds are normal.     Palpations: Abdomen is soft.  Musculoskeletal:        General: No tenderness or deformity. Normal range of motion.     Cervical back: Normal range of motion.  Lymphadenopathy:     Cervical: No cervical adenopathy.  Skin:    General: Skin is warm and dry.     Findings: No erythema or rash.  Neurological:     Mental Status: He is alert and oriented to person, place, and time.  Psychiatric:        Behavior: Behavior normal.        Thought Content: Thought content normal.        Judgment: Judgment normal.      Lab Results  Component Value Date   WBC 5.2 12/19/2020   HGB 11.4 (L) 12/19/2020   HCT 33.8 (L) 12/19/2020   MCV 88.5 12/19/2020   PLT 93 (L) 12/19/2020   Lab Results  Component Value Date   FERRITIN 59 11/21/2020   IRON 68 11/21/2020   TIBC 335 11/21/2020   UIBC 267 11/21/2020   IRONPCTSAT 20 11/21/2020   Lab Results   Component Value Date   RETICCTPCT 5.4 (H) 12/19/2020   RBC 3.76 (L) 12/19/2020   Lab Results  Component Value Date   KPAFRELGTCHN 49.7 (H) 02/17/2020   LAMBDASER 40.1 (H) 02/17/2020   KAPLAMBRATIO 6.90 04/30/2020   Lab Results  Component Value Date   IGGSERUM 1,267 04/29/2020   IGA 231 04/29/2020   IGMSERUM 36 04/29/2020   Lab Results  Component Value Date   TOTALPROTELP 6.5 04/29/2020   ALBUMINELP 2.9 02/17/2020  A1GS 0.4 02/17/2020   A2GS 0.7 02/17/2020   BETS 0.8 02/17/2020   GAMS 0.7 02/17/2020   MSPIKE 0.3 (H) 02/17/2020   SPEI Comment 02/17/2020     Chemistry      Component Value Date/Time   NA 141 12/19/2020 1343   K 4.8 12/19/2020 1343   CL 106 12/19/2020 1343   CO2 29 12/19/2020 1343   BUN 43 (H) 12/19/2020 1343   CREATININE 1.64 (H) 12/19/2020 1343      Component Value Date/Time   CALCIUM 9.7 12/19/2020 1343   ALKPHOS 82 12/19/2020 1343   AST 15 12/19/2020 1343   ALT 11 12/19/2020 1343   BILITOT 2.8 (H) 12/19/2020 1343       Impression and Plan: Mr. Dieudonne is a very pleasant 80 yo caucasian gentleman with myelodysplasia --refractoryanemia withringedsideroblast.   We will go with his Luspatercept again.  He does not need any Retacrit.  We will try to move the Luspatercept now to every 6 weeks.  We will try to continue to move his appointments out a little bit longer.  I am just happy that everything is working and that his blood counts are improving and that his quality of life is doing nicely.   Volanda Napoleon, MD 3/31/20222:46 PM

## 2020-12-19 NOTE — Telephone Encounter (Signed)
Called and left a vm with f/u appts per 12/19/20 los   Eric [name]

## 2020-12-20 LAB — IRON AND TIBC
Iron: 77 ug/dL (ref 42–163)
Saturation Ratios: 23 % (ref 20–55)
TIBC: 339 ug/dL (ref 202–409)
UIBC: 261 ug/dL (ref 117–376)

## 2020-12-20 LAB — FERRITIN: Ferritin: 61 ng/mL (ref 24–336)

## 2021-01-30 ENCOUNTER — Inpatient Hospital Stay (HOSPITAL_BASED_OUTPATIENT_CLINIC_OR_DEPARTMENT_OTHER): Payer: Medicare Other | Admitting: Hematology & Oncology

## 2021-01-30 ENCOUNTER — Inpatient Hospital Stay: Payer: Medicare Other | Attending: Hematology & Oncology

## 2021-01-30 ENCOUNTER — Inpatient Hospital Stay: Payer: Medicare Other

## 2021-01-30 ENCOUNTER — Inpatient Hospital Stay: Payer: Medicare Other | Admitting: Hematology & Oncology

## 2021-01-30 ENCOUNTER — Other Ambulatory Visit: Payer: Self-pay

## 2021-01-30 ENCOUNTER — Encounter: Payer: Self-pay | Admitting: Hematology & Oncology

## 2021-01-30 VITALS — BP 145/64 | HR 80 | Temp 97.9°F | Resp 19 | Wt 225.0 lb

## 2021-01-30 DIAGNOSIS — D4620 Refractory anemia with excess of blasts, unspecified: Secondary | ICD-10-CM | POA: Diagnosis not present

## 2021-01-30 DIAGNOSIS — Z79899 Other long term (current) drug therapy: Secondary | ICD-10-CM | POA: Diagnosis not present

## 2021-01-30 DIAGNOSIS — D461 Refractory anemia with ring sideroblasts: Secondary | ICD-10-CM

## 2021-01-30 LAB — CMP (CANCER CENTER ONLY)
ALT: 16 U/L (ref 0–44)
AST: 16 U/L (ref 15–41)
Albumin: 4.3 g/dL (ref 3.5–5.0)
Alkaline Phosphatase: 92 U/L (ref 38–126)
Anion gap: 7 (ref 5–15)
BUN: 58 mg/dL — ABNORMAL HIGH (ref 8–23)
CO2: 28 mmol/L (ref 22–32)
Calcium: 9.9 mg/dL (ref 8.9–10.3)
Chloride: 105 mmol/L (ref 98–111)
Creatinine: 1.88 mg/dL — ABNORMAL HIGH (ref 0.61–1.24)
GFR, Estimated: 36 mL/min — ABNORMAL LOW (ref >60)
Glucose, Bld: 207 mg/dL — ABNORMAL HIGH (ref 70–99)
Potassium: 5.3 mmol/L — ABNORMAL HIGH (ref 3.5–5.1)
Sodium: 140 mmol/L (ref 135–145)
Total Bilirubin: 2.6 mg/dL — ABNORMAL HIGH (ref 0.3–1.2)
Total Protein: 6.6 g/dL (ref 6.5–8.1)

## 2021-01-30 LAB — CBC WITH DIFFERENTIAL (CANCER CENTER ONLY)
Abs Immature Granulocytes: 0.02 10*3/uL (ref 0.00–0.07)
Basophils Absolute: 0 10*3/uL (ref 0.0–0.1)
Basophils Relative: 1 %
Eosinophils Absolute: 0.1 10*3/uL (ref 0.0–0.5)
Eosinophils Relative: 2 %
HCT: 32.9 % — ABNORMAL LOW (ref 39.0–52.0)
Hemoglobin: 11.3 g/dL — ABNORMAL LOW (ref 13.0–17.0)
Immature Granulocytes: 0 %
Lymphocytes Relative: 26 %
Lymphs Abs: 1.3 10*3/uL (ref 0.7–4.0)
MCH: 29.8 pg (ref 26.0–34.0)
MCHC: 34.3 g/dL (ref 30.0–36.0)
MCV: 86.8 fL (ref 80.0–100.0)
Monocytes Absolute: 0.4 10*3/uL (ref 0.1–1.0)
Monocytes Relative: 9 %
Neutro Abs: 3.1 10*3/uL (ref 1.7–7.7)
Neutrophils Relative %: 62 %
Platelet Count: 106 10*3/uL — ABNORMAL LOW (ref 150–400)
RBC: 3.79 MIL/uL — ABNORMAL LOW (ref 4.22–5.81)
RDW: 15.9 % — ABNORMAL HIGH (ref 11.5–15.5)
WBC Count: 5 10*3/uL (ref 4.0–10.5)
nRBC: 0 % (ref 0.0–0.2)

## 2021-01-30 LAB — RETICULOCYTES
Immature Retic Fract: 14.5 % (ref 2.3–15.9)
RBC.: 3.76 MIL/uL — ABNORMAL LOW (ref 4.22–5.81)
Retic Count, Absolute: 190.6 10*3/uL — ABNORMAL HIGH (ref 19.0–186.0)
Retic Ct Pct: 5.1 % — ABNORMAL HIGH (ref 0.4–3.1)

## 2021-01-30 MED ORDER — LUSPATERCEPT-AAMT 75 MG ~~LOC~~ SOLR
100.0000 mg | Freq: Once | SUBCUTANEOUS | Status: AC
Start: 2021-01-30 — End: 2021-01-30
  Administered 2021-01-30: 100 mg via SUBCUTANEOUS
  Filled 2021-01-30: qty 1.5

## 2021-01-30 NOTE — Addendum Note (Signed)
Addended by: Burney Gauze R on: 01/30/2021 02:41 PM   Modules accepted: Orders

## 2021-01-30 NOTE — Progress Notes (Signed)
Hematology and Oncology Follow Up Visit  Eric [name] [id number] [birth year] 80 y.o. 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  He is back to work now.  He works for a [employer].  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  He clearly is responding to the Luspatercept.  His hemoglobin is holding steady.  We last gave him Luspatercept 6 weeks ago.  Hopefully we can now move his Luspatercept every 2 months.  He has had some problems with his blood sugars.  They have been on the high side.  I know that his family doctor is trying to work with him on this.  He has had no problems with nausea or vomiting.  He has had no issues with bowels or bladder.  He has had little bit of leg swelling.  His last iron studies back in March showed a ferritin of 61 with an iron saturation of 23%.    Overall, I would have to say that his performance status is ECOG 1.    Medications:  Allergies as of 01/30/2021      Reactions   Iohexol Other (See Comments)    Desc: "shuts kidneys down"   Morphine And Related Shortness Of Breath   Hydrocodone Nausea And Vomiting   Sulfa Antibiotics Other (See Comments)   UNKNOWN REACTION   Sulfonamide Derivatives Other (See Comments)   UNKNOWN REACTION   Trazodone Rash   Pain   Trazodone And Nefazodone Rash   Pain      Medication List       Accurate as of Jan 30, 2021  2:14 PM. If you have any questions, ask your nurse or doctor.        acetaminophen 325 MG tablet Commonly known as: TYLENOL Take 650 mg by mouth every 6 (six) hours as needed for moderate pain.   allopurinol 100 MG tablet Commonly known as:  ZYLOPRIM Take 100 mg by mouth daily.   augmented betamethasone dipropionate 0.05 % cream Commonly known as: DIPROLENE-AF APPLY TO AFFECTED AREA TWICE A DAY   Eliquis 5 MG Tabs tablet Generic drug: apixaban Take 1 tablet (5 mg total) by mouth 2 (two) times daily. Taking once daily What changed: additional instructions   fluocinonide 0.05 % external solution Commonly known as: LIDEX Apply topically. PRN for rash   insulin lispro 100 UNIT/ML KwikPen Commonly known as: HUMALOG Inject 9 Units into the skin 3 (three) times daily.   levothyroxine 100 MCG tablet Commonly known as: SYNTHROID Take 50-100 mcg by mouth See admin instructions. Takes 100 mg daily except Sundays, on Sunday takes 50 mg   mupirocin ointment 2 % Commonly known as: BACTROBAN PLEASE SEE ATTACHED FOR DETAILED DIRECTIONS   OneTouch Delica Lancets 24M Misc 4 (four) times daily.   silodosin 8 MG Caps capsule Commonly known as: RAPAFLO Take 8 mg by mouth daily.   simvastatin 20 MG tablet Commonly known as: ZOCOR TAKE 1 TABLET BY MOUTH AT  BEDTIME   spironolactone 50 MG tablet Commonly known as: ALDACTONE TAKE 1 TABLET BY MOUTH  DAILY   torsemide 20 MG tablet Commonly known as: DEMADEX Take 1 tablet (20 mg total)  by mouth in the morning and at bedtime.   Toujeo SoloStar 300 UNIT/ML Solostar Pen Generic drug: insulin glargine (1 Unit Dial) Inject 26 Units into the skin daily. Sliding scale   triamcinolone cream 0.1 % Commonly known as: KENALOG Apply 1 application topically as needed. PRN for rash What changed: Another medication with the same name was removed. Continue taking this medication, and follow the directions you see here. Changed by: Volanda Napoleon, MD       Allergies:  Allergies  Allergen Reactions  . Iohexol Other (See Comments)     Desc: "shuts kidneys down"   . Morphine And Related Shortness Of Breath  . Hydrocodone Nausea And Vomiting  . Sulfa Antibiotics Other (See Comments)     UNKNOWN REACTION  . Sulfonamide Derivatives Other (See Comments)    UNKNOWN REACTION  . Trazodone Rash    Pain  . Trazodone And Nefazodone Rash    Pain    Past Medical History, Surgical history, Social history, and Family History were reviewed and updated.  Review of Systems: Review of Systems  Constitutional: Positive for malaise/fatigue.  HENT: Negative.   Eyes: Negative.   Respiratory: Positive for shortness of breath.   Cardiovascular: Positive for palpitations and leg swelling.  Gastrointestinal: Negative.   Genitourinary: Negative.   Musculoskeletal: Positive for joint pain and myalgias.  Skin: Positive for rash.  Neurological: Positive for focal weakness.  Endo/Heme/Allergies: Negative.   Psychiatric/Behavioral: Negative.      Physical Exam:  weight is 225 lb (102.1 kg). His oral temperature is 97.9 F (36.6 C). His blood pressure is 145/64 (abnormal) and his pulse is 80. His respiration is 19 and oxygen saturation is 100%.   Wt Readings from Last 3 Encounters:  01/30/21 225 lb (102.1 kg)  12/19/20 228 lb (103.4 kg)  11/21/20 228 lb 6.4 oz (103.6 kg)    Physical Exam Vitals reviewed.  HENT:     Head: Normocephalic and atraumatic.  Eyes:     Pupils: Pupils are equal, round, and reactive to light.  Cardiovascular:     Rate and Rhythm: Normal rate and regular rhythm.     Heart sounds: Normal heart sounds.  Pulmonary:     Effort: Pulmonary effort is normal.     Breath sounds: Normal breath sounds.  Abdominal:     General: Bowel sounds are normal.     Palpations: Abdomen is soft.  Musculoskeletal:        General: No tenderness or deformity. Normal range of motion.     Cervical back: Normal range of motion.  Lymphadenopathy:     Cervical: No cervical adenopathy.  Skin:    General: Skin is warm and dry.     Findings: No erythema or rash.  Neurological:     Mental Status: He is alert and oriented to person, place, and time.  Psychiatric:         Behavior: Behavior normal.        Thought Content: Thought content normal.        Judgment: Judgment normal.      Lab Results  Component Value Date   WBC 5.0 01/30/2021   HGB 11.3 (L) 01/30/2021   HCT 32.9 (L) 01/30/2021   MCV 86.8 01/30/2021   PLT 106 (L) 01/30/2021   Lab Results  Component Value Date   FERRITIN 61 12/19/2020   IRON 77 12/19/2020   TIBC 339 12/19/2020   UIBC 261 12/19/2020   IRONPCTSAT 23 12/19/2020   Lab Results  Component Value Date   RETICCTPCT 5.1 (H) 01/30/2021   RBC 3.79 (L) 01/30/2021   RBC 3.76 (L) 01/30/2021   Lab Results  Component Value Date   KPAFRELGTCHN 49.7 (H) 02/17/2020   LAMBDASER 40.1 (H) 02/17/2020   KAPLAMBRATIO 6.90 04/30/2020   Lab Results  Component Value Date   IGGSERUM 1,267 04/29/2020   IGA 231 04/29/2020   IGMSERUM 36 04/29/2020   Lab Results  Component Value Date   TOTALPROTELP 6.5 04/29/2020   ALBUMINELP 2.9 02/17/2020   A1GS 0.4 02/17/2020   A2GS 0.7 02/17/2020   BETS 0.8 02/17/2020   GAMS 0.7 02/17/2020   MSPIKE 0.3 (H) 02/17/2020   SPEI Comment 02/17/2020     Chemistry      Component Value Date/Time   NA 141 12/19/2020 1343   K 4.8 12/19/2020 1343   CL 106 12/19/2020 1343   CO2 29 12/19/2020 1343   BUN 43 (H) 12/19/2020 1343   CREATININE 1.64 (H) 12/19/2020 1343      Component Value Date/Time   CALCIUM 9.7 12/19/2020 1343   ALKPHOS 82 12/19/2020 1343   AST 15 12/19/2020 1343   ALT 11 12/19/2020 1343   BILITOT 2.8 (H) 12/19/2020 1343       Impression and Plan: Mr. Badie is a very pleasant 80 yo caucasian gentleman with myelodysplasia --refractoryanemia withringedsideroblast.   We will go with his Luspatercept again.  He does not need any Retacrit.  We will try to move the Luspatercept now to every 8 weeks.  We will try to continue to move his appointments out a little bit longer.  I am just happy that everything is working and that his blood counts are improving and that his quality of  life is doing nicely.  I am mostly happy that he is able to work which for him is very important.   Volanda Napoleon, MD 5/12/20222:14 PM

## 2021-01-30 NOTE — Patient Instructions (Signed)
Luspatercept injection What is this medicine? LUSPATERCEPT (lus PAT er sept) helps your body make more red blood cells. This medicine is used to treat anemia caused by beta thalassemia or myelodysplastic syndromes. This medicine may be used for other purposes; ask your health care provider or pharmacist if you have questions. COMMON BRAND NAME(S): REBLOZYL What should I tell my health care provider before I take this medicine? They need to know if you have any of these conditions:  cigarette smoker  have had your spleen removed  high blood pressure  history of blood clots  an unusual or allergic reaction to luspatercept, other medicines, foods, dyes or preservatives  pregnant or trying to get pregnant  breast-feeding How should I use this medicine? This medicine is for injection under the skin. It is given by a healthcare professional in a hospital or clinic setting. Talk to your pediatrician about the use of the medicine in children. This medicine is not approved for use in children. Overdosage: If you think you have taken too much of this medicine contact a poison control center or emergency room at once. NOTE: This medicine is only for you. Do not share this medicine with others. What if I miss a dose? Keep appointments for follow-up doses. It is important not to miss your dose. Call your doctor or healthcare professional if you are unable to keep an appointment. What may interact with this medicine? Interactions are not expected. This list may not describe all possible interactions. Give your health care provider a list of all the medicines, herbs, non-prescription drugs, or dietary supplements you use. Also tell them if you smoke, drink alcohol, or use illegal drugs. Some items may interact with your medicine. What should I watch for while using this medicine? Your condition will be monitored carefully while you are receiving this medicine. Do not become pregnant while taking  this medicine or for 3 months after stopping it. Women should inform their healthcare professional if they wish to become pregnant or think they might be pregnant. There is a potential for serious side effects and harm to an unborn child. Talk to your healthcare professional for more information. Do not breast-feed an infant while taking this medicine. You may need blood work done while you are taking this medicine. What side effects may I notice from receiving this medicine? Side effects that you should report to your doctor or health care professional as soon as possible:  allergic reactions like skin rash, itching or hives; swelling of the face, lips, or tongue  signs and symptoms of a blood clot such as chest pain; shortness of breath; pain, swelling, or warmth in the leg  signs and symptoms of a stroke like changes in vision; confusion; trouble speaking or understanding; severe headaches; sudden numbness or weakness of the face, arm or leg; trouble walking; dizziness; loss of balance or coordination Side effects that usually do not require medical attention (report these to your doctor or health care professional if they continue or are bothersome):  cough  diarrhea  dizziness  headache  joint pain  stomach pain  tiredness This list may not describe all possible side effects. Call your doctor for medical advice about side effects. You may report side effects to FDA at 1-800-FDA-1088. Where should I keep my medicine? This medicine is given in a hospital or clinic and will not be stored at home. NOTE: This sheet is a summary. It may not cover all possible information. If you have questions about   this medicine, talk to your doctor, pharmacist, or health care provider.  2021 Elsevier/Gold Standard (2018-12-27 15:57:59)

## 2021-01-31 ENCOUNTER — Encounter: Payer: Self-pay | Admitting: *Deleted

## 2021-01-31 LAB — IRON AND TIBC
Iron: 72 ug/dL (ref 42–163)
Saturation Ratios: 19 % — ABNORMAL LOW (ref 20–55)
TIBC: 375 ug/dL (ref 202–409)
UIBC: 303 ug/dL (ref 117–376)

## 2021-01-31 LAB — FERRITIN: Ferritin: 65 ng/mL (ref 24–336)

## 2021-02-10 MED ORDER — ELIQUIS 5 MG PO TABS: 5.0000 mg | ORAL_TABLET | Freq: Two times a day (BID) | ORAL | 3 refills | Status: DC

## 2021-03-06 IMAGING — CT CT BIOPSY AND ASPIRATION BONE MARROW
1 of 6 series · 15 of 32 positions shown, 19 images · non-contrast
Comparison: none

INDICATION: 79-year-old with thrombocytopenia and anemia.

[Series 2: i-spiral 5.0 br40 · axial · 0.98mm/px · z∈[-701,-596]mm · 15 of 35 slices shown, 19 images]
[im 3/35  mediastinal]
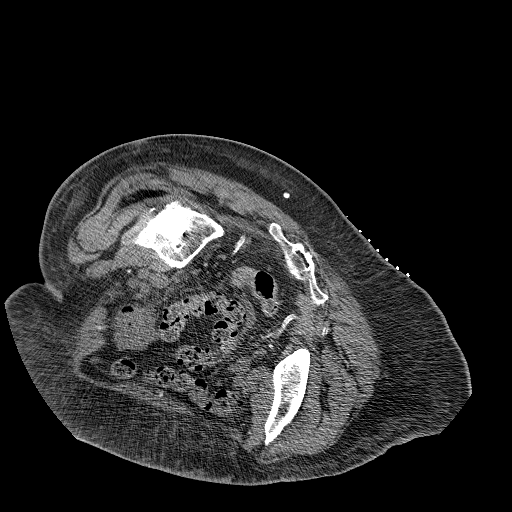
[im 3/35  lung]
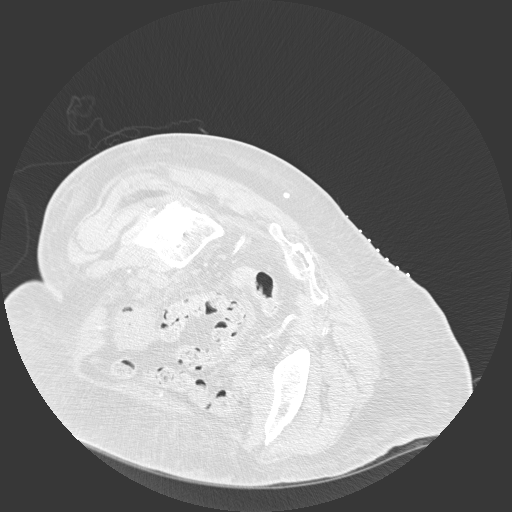
[im 5/35  lung]
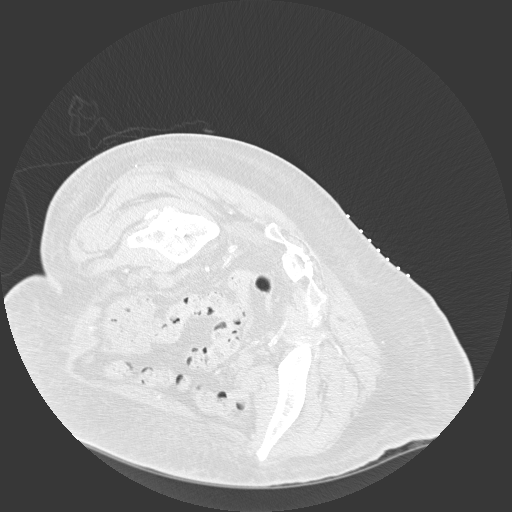
[im 7/35  lung]
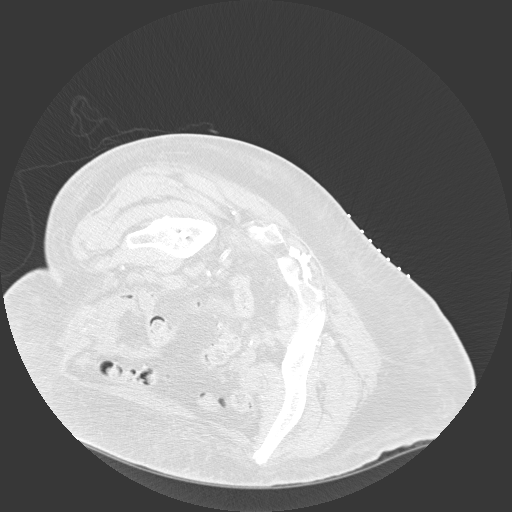
[im 9/35  lung]
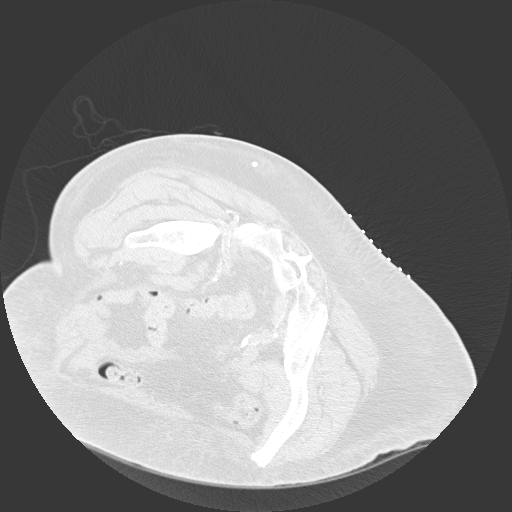
[im 13/35  mediastinal]
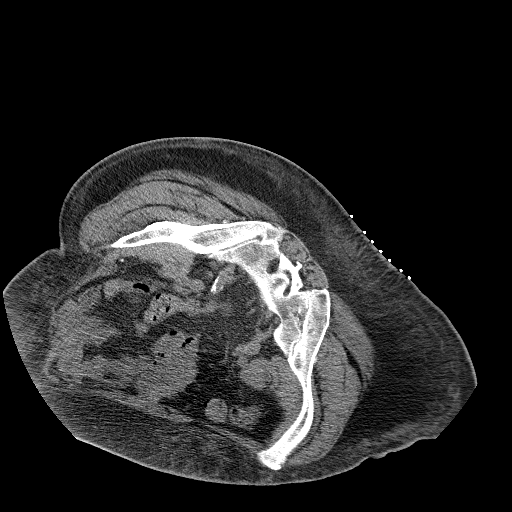
[im 13/35  lung]
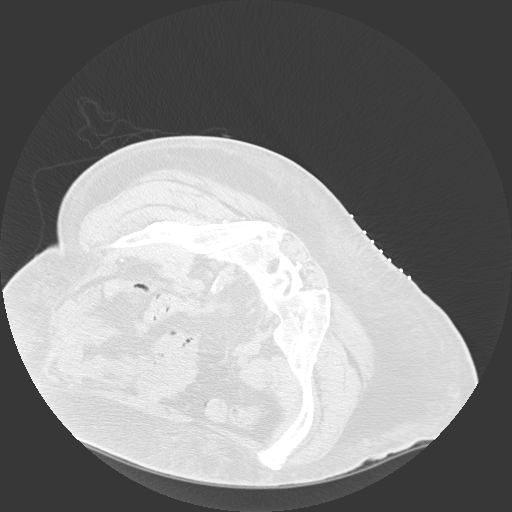
[im 15/35  lung]
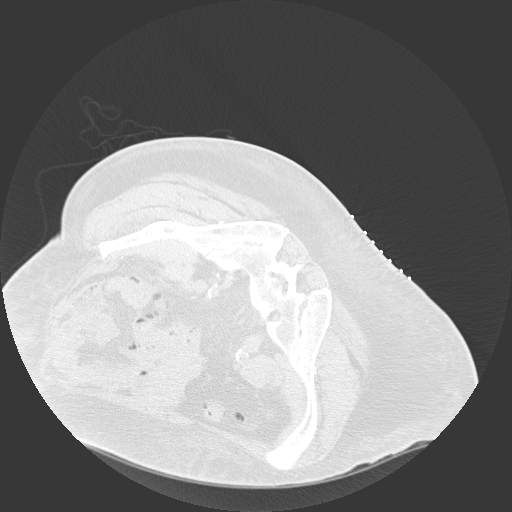
[im 16/35  lung]
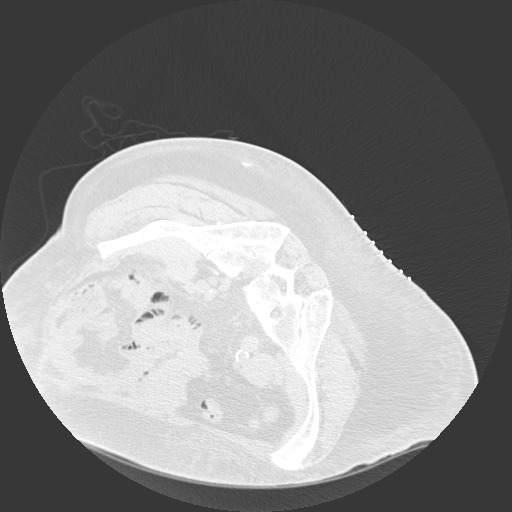
[im 17/35  lung]
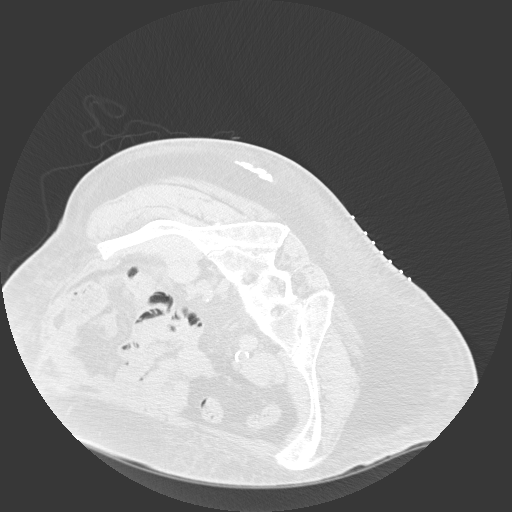
[im 19/35  mediastinal]
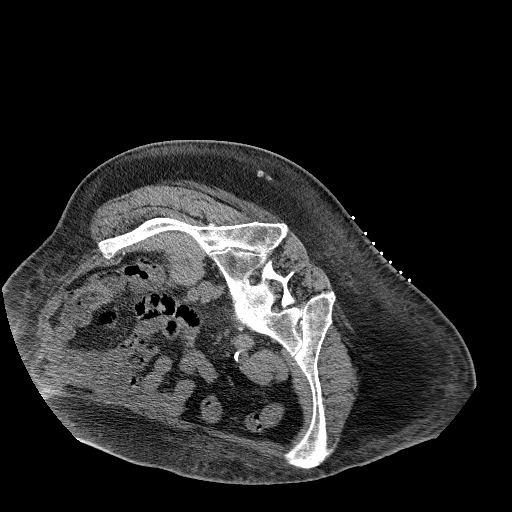
[im 19/35  lung]
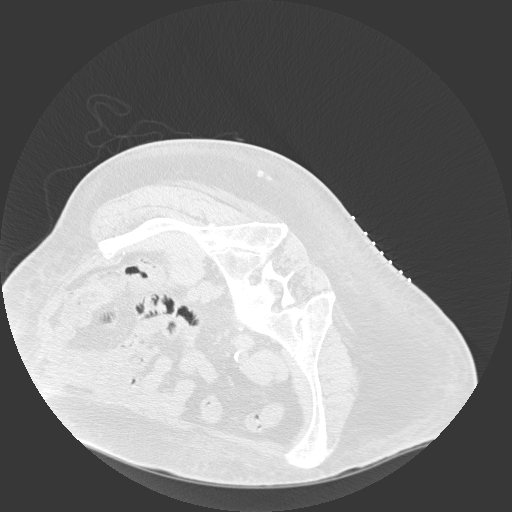
[im 21/35  lung]
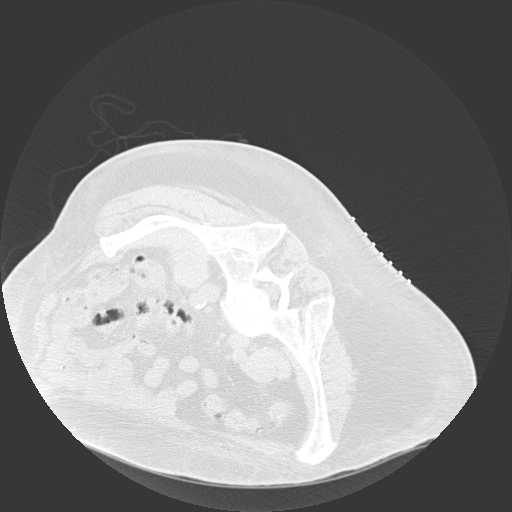
[im 23/35  lung]
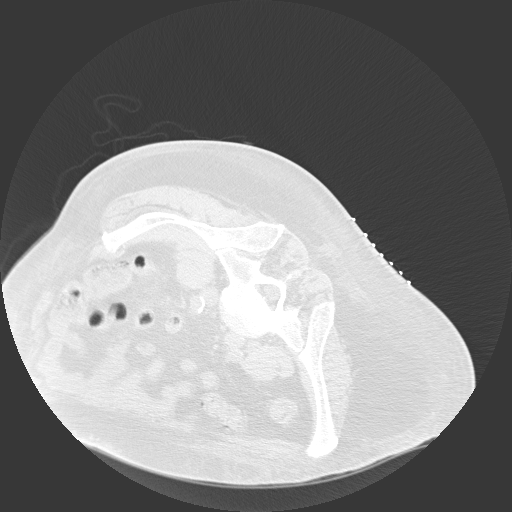
[im 27/35  lung]
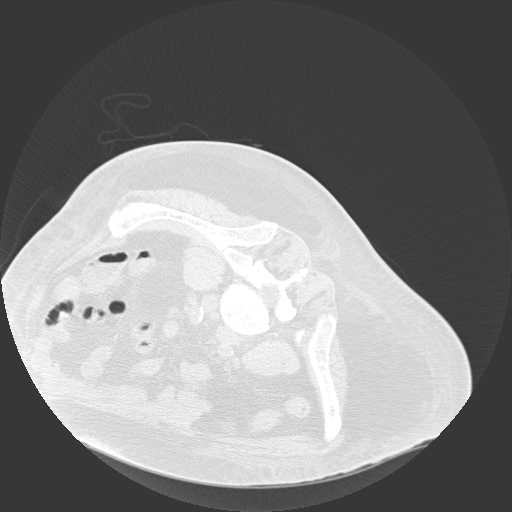
[im 29/35  mediastinal]
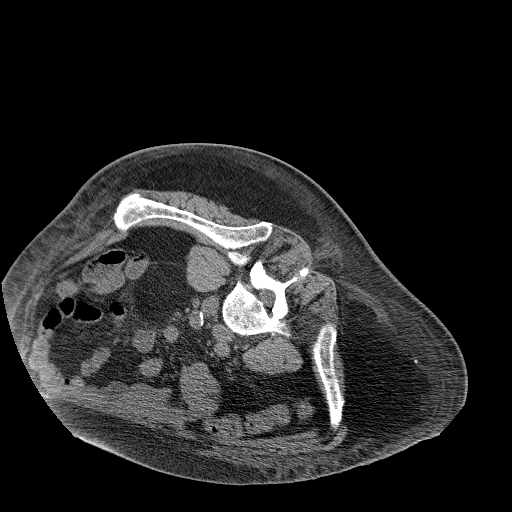
[im 29/35  lung]
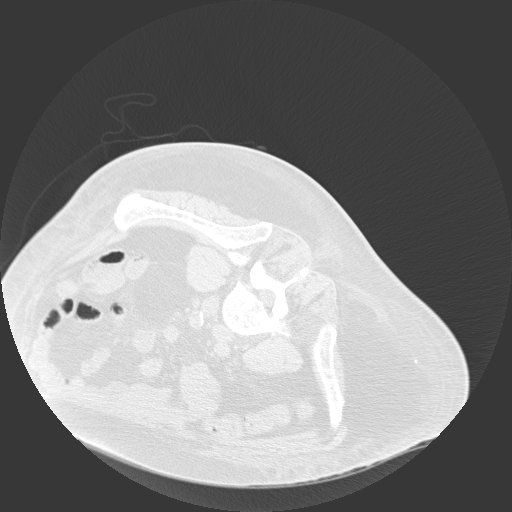
[im 31/35  lung]
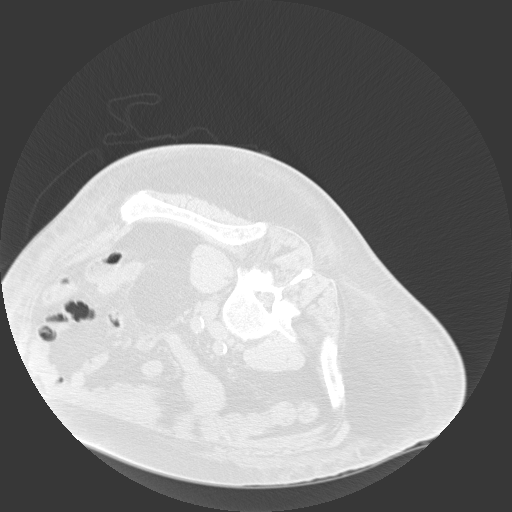
[im 33/35  lung]
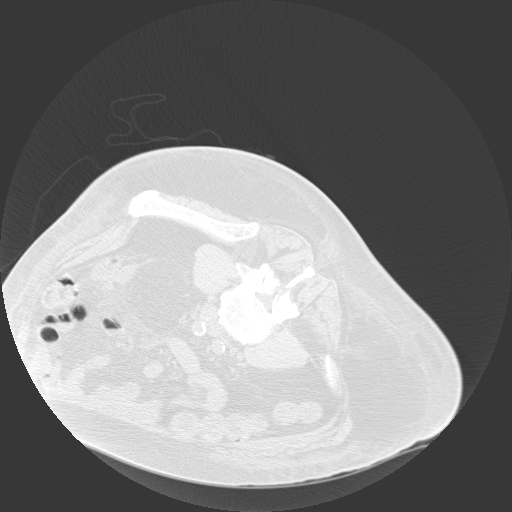

[15 of 32 positions shown; findings below may reference images not displayed]

EXAM:
CT GUIDED BONE MARROW ASPIRATES AND BIOPSY

MEDICATIONS:
None.

ANESTHESIA/SEDATION:
Fentanyl 50 mcg IV; Versed 2.0 mg IV

Moderate Sedation Time:  15 minutes

The patient was continuously monitored during the procedure by the
interventional radiology nurse under my direct supervision.

COMPLICATIONS:
None immediate.

PROCEDURE:
The procedure was explained to the patient. The risks and benefits
of the procedure were discussed and the patient's questions were
addressed. Informed consent was obtained from the patient. Patient
was unable to lie prone. Therefore, the patient was placed in left
lateral decubitus position. Images of the pelvis were obtained.
Lower back was prepped with chlorhexidine and sterile field was
created. The skin and left posterior ilium were anesthetized with 1%
lidocaine. 11 gauge bone needle was directed into the left ilium
with CT guidance. Two aspirates and one core biopsy were obtained.
Bandage placed over the puncture site.
IMPRESSION: CT guided bone marrow aspiration and core biopsy.

## 2021-03-06 IMAGING — CT CT BIOPSY
1 of 6 series · 15 of 32 positions shown, 19 images · non-contrast
Comparison: none

INDICATION: 79-year-old with thrombocytopenia and anemia.

[Series 2: i-spiral 5.0 br40 · axial · 0.98mm/px · z∈[-701,-596]mm · 15 of 35 slices shown, 19 images]
[im 3/35  mediastinal]
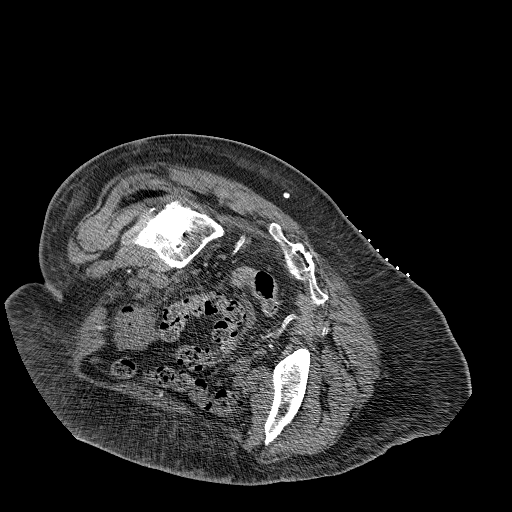
[im 3/35  lung]
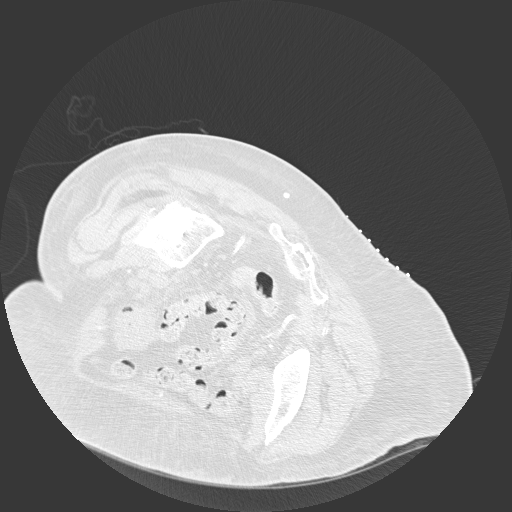
[im 5/35  lung]
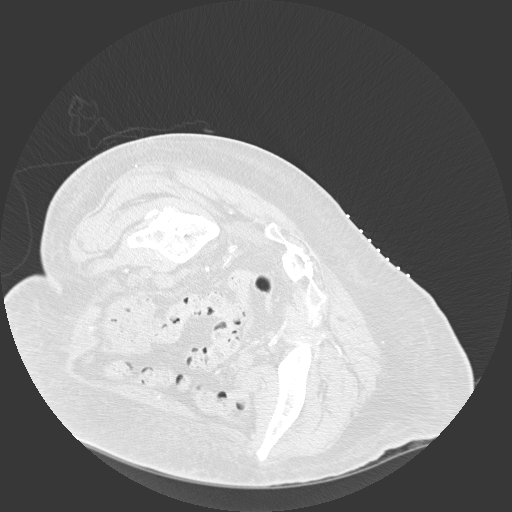
[im 7/35  lung]
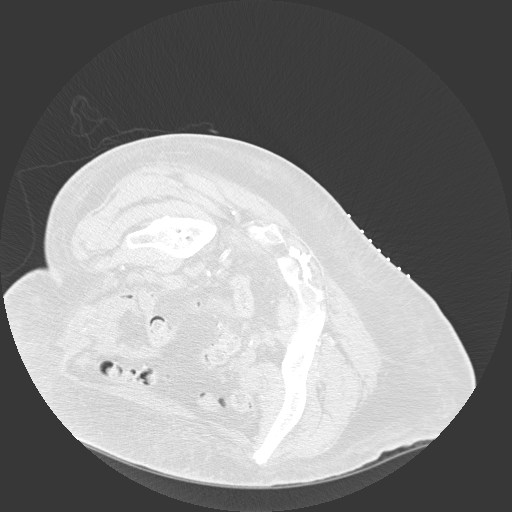
[im 9/35  lung]
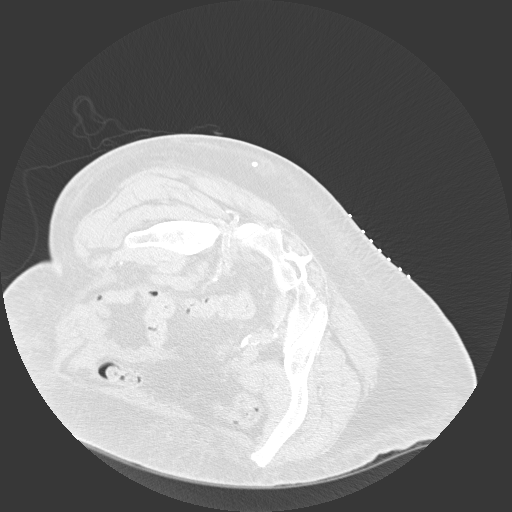
[im 13/35  mediastinal]
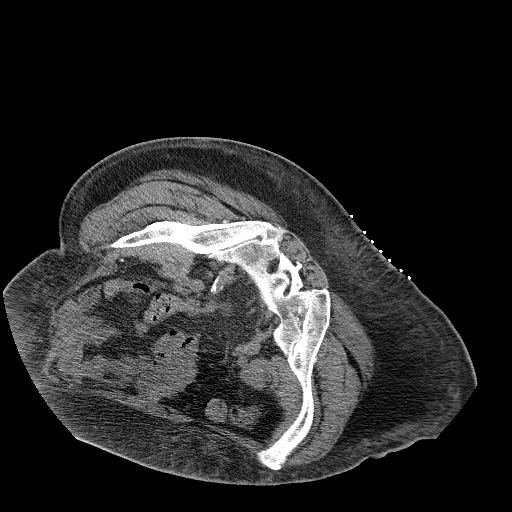
[im 13/35  lung]
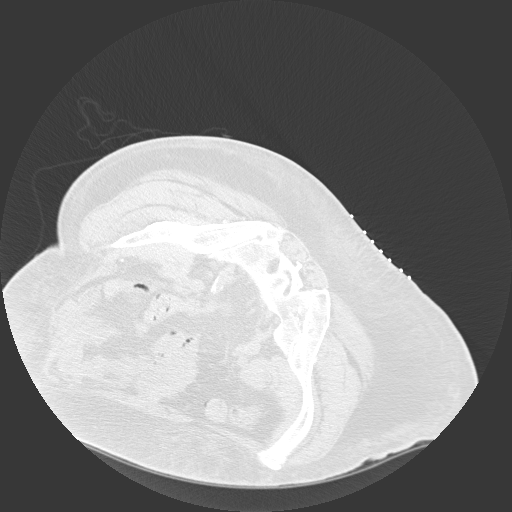
[im 15/35  lung]
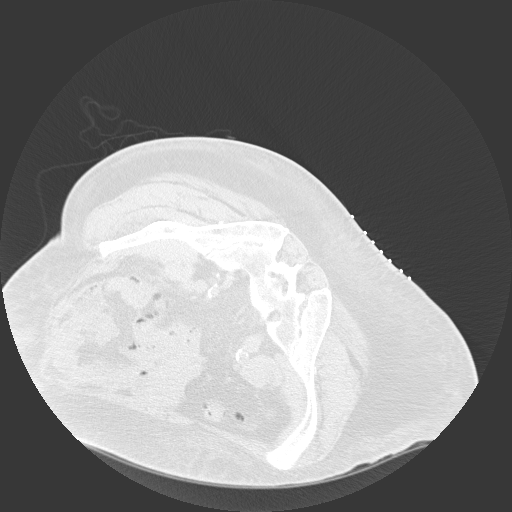
[im 16/35  lung]
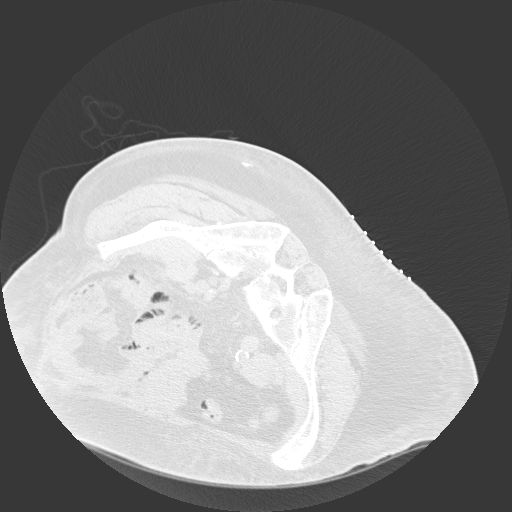
[im 17/35  lung]
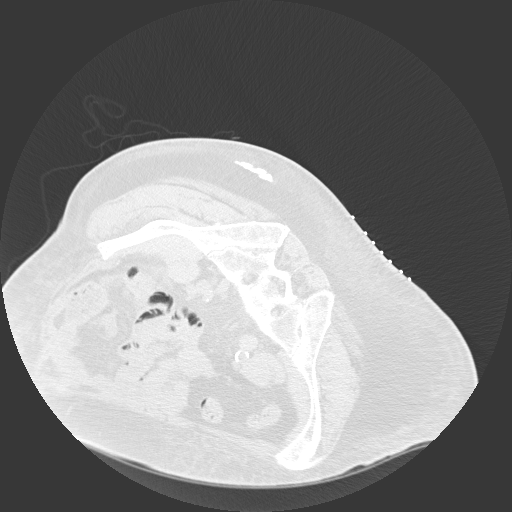
[im 19/35  mediastinal]
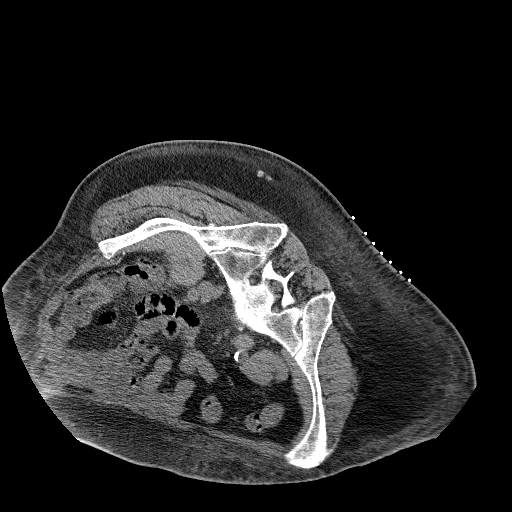
[im 19/35  lung]
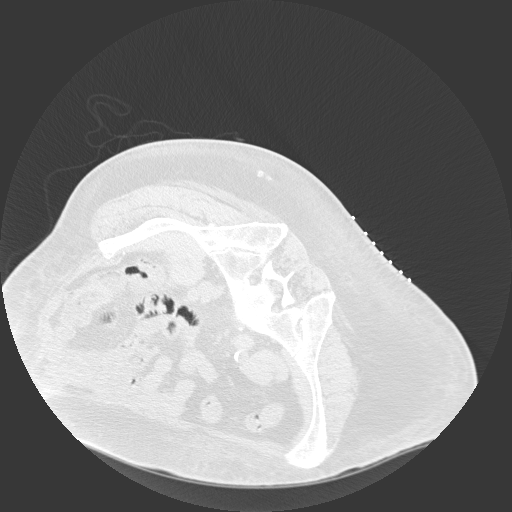
[im 21/35  lung]
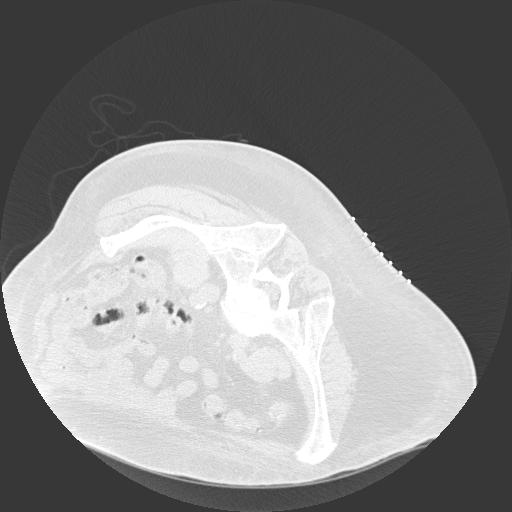
[im 23/35  lung]
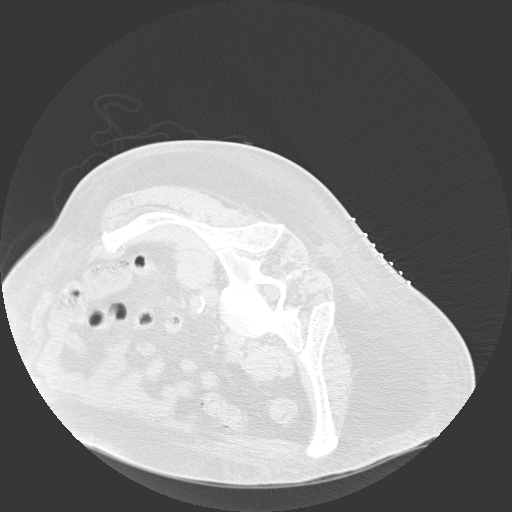
[im 27/35  lung]
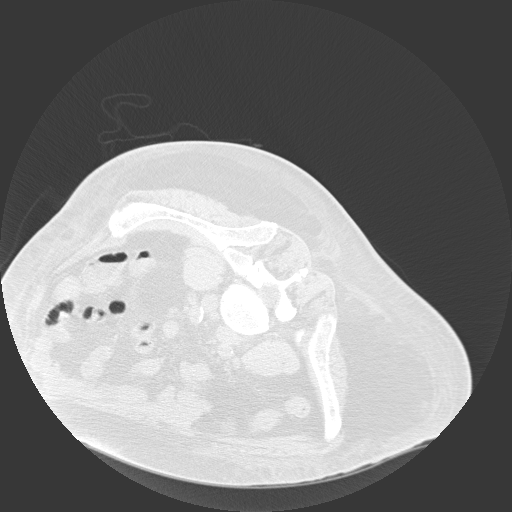
[im 29/35  mediastinal]
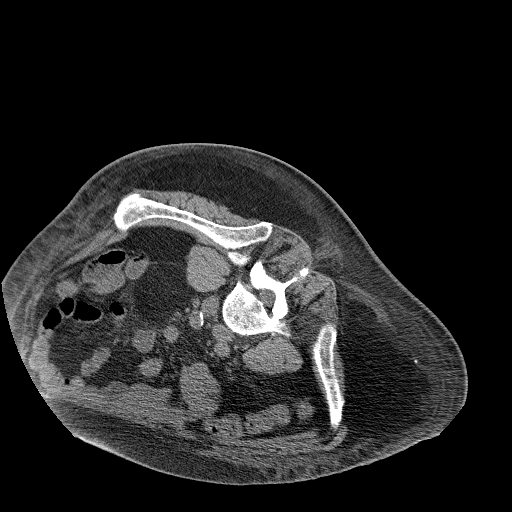
[im 29/35  lung]
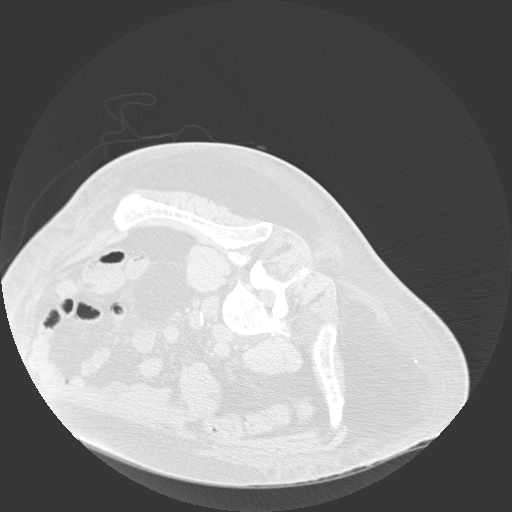
[im 31/35  lung]
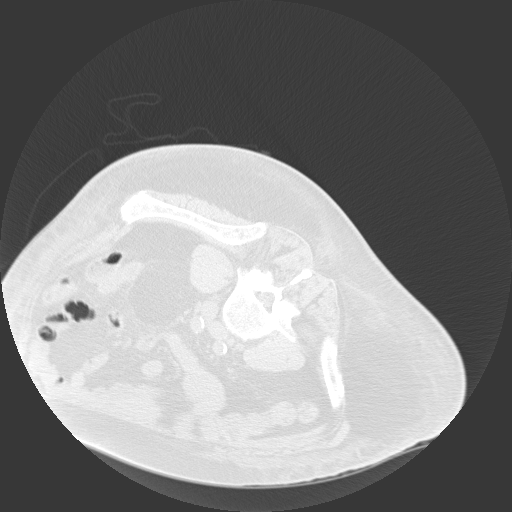
[im 33/35  lung]
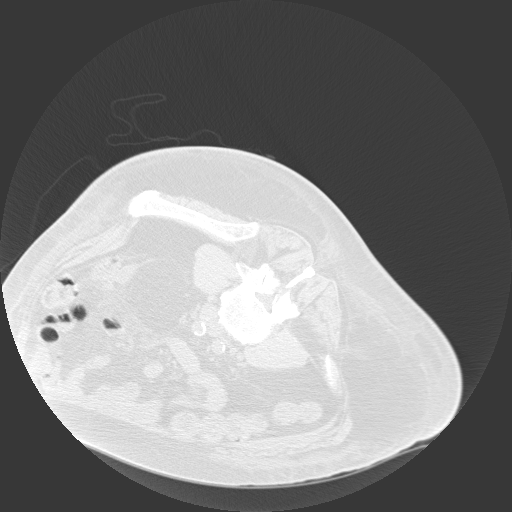

[15 of 32 positions shown; findings below may reference images not displayed]

EXAM:
CT GUIDED BONE MARROW ASPIRATES AND BIOPSY

MEDICATIONS:
None.

ANESTHESIA/SEDATION:
Fentanyl 50 mcg IV; Versed 2.0 mg IV

Moderate Sedation Time:  15 minutes

The patient was continuously monitored during the procedure by the
interventional radiology nurse under my direct supervision.

COMPLICATIONS:
None immediate.

PROCEDURE:
The procedure was explained to the patient. The risks and benefits
of the procedure were discussed and the patient's questions were
addressed. Informed consent was obtained from the patient. Patient
was unable to lie prone. Therefore, the patient was placed in left
lateral decubitus position. Images of the pelvis were obtained.
Lower back was prepped with chlorhexidine and sterile field was
created. The skin and left posterior ilium were anesthetized with 1%
lidocaine. 11 gauge bone needle was directed into the left ilium
with CT guidance. Two aspirates and one core biopsy were obtained.
Bandage placed over the puncture site.
IMPRESSION: CT guided bone marrow aspiration and core biopsy.

## 2021-03-26 ENCOUNTER — Inpatient Hospital Stay (HOSPITAL_BASED_OUTPATIENT_CLINIC_OR_DEPARTMENT_OTHER): Payer: Medicare Other | Admitting: Hematology & Oncology

## 2021-03-26 ENCOUNTER — Inpatient Hospital Stay: Payer: Medicare Other

## 2021-03-26 ENCOUNTER — Encounter: Payer: Self-pay | Admitting: Hematology & Oncology

## 2021-03-26 ENCOUNTER — Other Ambulatory Visit: Payer: Self-pay

## 2021-03-26 ENCOUNTER — Inpatient Hospital Stay: Payer: Medicare Other | Attending: Hematology & Oncology

## 2021-03-26 VITALS — BP 162/69 | HR 83 | Temp 97.9°F | Resp 18 | Wt 238.0 lb

## 2021-03-26 DIAGNOSIS — D4620 Refractory anemia with excess of blasts, unspecified: Secondary | ICD-10-CM

## 2021-03-26 DIAGNOSIS — Z79899 Other long term (current) drug therapy: Secondary | ICD-10-CM | POA: Insufficient documentation

## 2021-03-26 DIAGNOSIS — D469 Myelodysplastic syndrome, unspecified: Secondary | ICD-10-CM | POA: Insufficient documentation

## 2021-03-26 DIAGNOSIS — D518 Other vitamin B12 deficiency anemias: Secondary | ICD-10-CM

## 2021-03-26 DIAGNOSIS — D461 Refractory anemia with ring sideroblasts: Secondary | ICD-10-CM

## 2021-03-26 LAB — CBC WITH DIFFERENTIAL (CANCER CENTER ONLY)
Abs Immature Granulocytes: 0.02 10*3/uL (ref 0.00–0.07)
Basophils Absolute: 0 10*3/uL (ref 0.0–0.1)
Basophils Relative: 1 %
Eosinophils Absolute: 0.1 10*3/uL (ref 0.0–0.5)
Eosinophils Relative: 2 %
HCT: 28.2 % — ABNORMAL LOW (ref 39.0–52.0)
Hemoglobin: 9.6 g/dL — ABNORMAL LOW (ref 13.0–17.0)
Immature Granulocytes: 0 %
Lymphocytes Relative: 18 %
Lymphs Abs: 0.9 10*3/uL (ref 0.7–4.0)
MCH: 30.3 pg (ref 26.0–34.0)
MCHC: 34 g/dL (ref 30.0–36.0)
MCV: 89 fL (ref 80.0–100.0)
Monocytes Absolute: 0.4 10*3/uL (ref 0.1–1.0)
Monocytes Relative: 9 %
Neutro Abs: 3.3 10*3/uL (ref 1.7–7.7)
Neutrophils Relative %: 70 %
Platelet Count: 96 10*3/uL — ABNORMAL LOW (ref 150–400)
RBC: 3.17 MIL/uL — ABNORMAL LOW (ref 4.22–5.81)
RDW: 16.3 % — ABNORMAL HIGH (ref 11.5–15.5)
WBC Count: 4.7 10*3/uL (ref 4.0–10.5)
nRBC: 0 % (ref 0.0–0.2)

## 2021-03-26 LAB — CMP (CANCER CENTER ONLY)
ALT: 12 U/L (ref 0–44)
AST: 14 U/L — ABNORMAL LOW (ref 15–41)
Albumin: 4.4 g/dL (ref 3.5–5.0)
Alkaline Phosphatase: 96 U/L (ref 38–126)
Anion gap: 6 (ref 5–15)
BUN: 49 mg/dL — ABNORMAL HIGH (ref 8–23)
CO2: 27 mmol/L (ref 22–32)
Calcium: 9.4 mg/dL (ref 8.9–10.3)
Chloride: 104 mmol/L (ref 98–111)
Creatinine: 1.87 mg/dL — ABNORMAL HIGH (ref 0.61–1.24)
GFR, Estimated: 36 mL/min — ABNORMAL LOW (ref >60)
Glucose, Bld: 212 mg/dL — ABNORMAL HIGH (ref 70–99)
Potassium: 5.2 mmol/L — ABNORMAL HIGH (ref 3.5–5.1)
Sodium: 137 mmol/L (ref 135–145)
Total Bilirubin: 2.8 mg/dL — ABNORMAL HIGH (ref 0.3–1.2)
Total Protein: 6.6 g/dL (ref 6.5–8.1)

## 2021-03-26 LAB — RETICULOCYTES
Immature Retic Fract: 15.6 % (ref 2.3–15.9)
RBC.: 3.21 MIL/uL — ABNORMAL LOW (ref 4.22–5.81)
Retic Count, Absolute: 161.8 10*3/uL (ref 19.0–186.0)
Retic Ct Pct: 5 % — ABNORMAL HIGH (ref 0.4–3.1)

## 2021-03-26 LAB — SAVE SMEAR(SSMR), FOR PROVIDER SLIDE REVIEW

## 2021-03-26 MED ORDER — EPOETIN ALFA-EPBX 40000 UNIT/ML IJ SOLN
INTRAMUSCULAR | Status: AC
  Filled 2021-03-26: qty 1

## 2021-03-26 MED ORDER — EPOETIN ALFA-EPBX 40000 UNIT/ML IJ SOLN
40000.0000 [IU] | Freq: Once | INTRAMUSCULAR | Status: AC
  Administered 2021-03-26: 40000 [IU] via SUBCUTANEOUS

## 2021-03-26 MED ORDER — LUSPATERCEPT-AAMT 75 MG ~~LOC~~ SOLR
100.0000 mg | Freq: Once | SUBCUTANEOUS | Status: AC
Start: 2021-03-26 — End: 2021-03-26
  Administered 2021-03-26: 100 mg via SUBCUTANEOUS
  Filled 2021-03-26: qty 1.5

## 2021-03-26 NOTE — Patient Instructions (Signed)
Luspatercept injection What is this medicine? LUSPATERCEPT (lus PAT er sept) helps your body make more red blood cells. This medicine is used to treat anemia caused by beta thalassemia or myelodysplastic syndromes. This medicine may be used for other purposes; ask your health care provider or pharmacist if you have questions. COMMON BRAND NAME(S): REBLOZYL What should I tell my health care provider before I take this medicine? They need to know if you have any of these conditions:  cigarette smoker  have had your spleen removed  high blood pressure  history of blood clots  an unusual or allergic reaction to luspatercept, other medicines, foods, dyes or preservatives  pregnant or trying to get pregnant  breast-feeding How should I use this medicine? This medicine is for injection under the skin. It is given by a healthcare professional in a hospital or clinic setting. Talk to your pediatrician about the use of the medicine in children. This medicine is not approved for use in children. Overdosage: If you think you have taken too much of this medicine contact a poison control center or emergency room at once. NOTE: This medicine is only for you. Do not share this medicine with others. What if I miss a dose? Keep appointments for follow-up doses. It is important not to miss your dose. Call your doctor or healthcare professional if you are unable to keep an appointment. What may interact with this medicine? Interactions are not expected. This list may not describe all possible interactions. Give your health care provider a list of all the medicines, herbs, non-prescription drugs, or dietary supplements you use. Also tell them if you smoke, drink alcohol, or use illegal drugs. Some items may interact with your medicine. What should I watch for while using this medicine? Your condition will be monitored carefully while you are receiving this medicine. Do not become pregnant while taking  this medicine or for 3 months after stopping it. Women should inform their healthcare professional if they wish to become pregnant or think they might be pregnant. There is a potential for serious side effects and harm to an unborn child. Talk to your healthcare professional for more information. Do not breast-feed an infant while taking this medicine. You may need blood work done while you are taking this medicine. What side effects may I notice from receiving this medicine? Side effects that you should report to your doctor or health care professional as soon as possible:  allergic reactions like skin rash, itching or hives; swelling of the face, lips, or tongue  signs and symptoms of a blood clot such as chest pain; shortness of breath; pain, swelling, or warmth in the leg  signs and symptoms of a stroke like changes in vision; confusion; trouble speaking or understanding; severe headaches; sudden numbness or weakness of the face, arm or leg; trouble walking; dizziness; loss of balance or coordination Side effects that usually do not require medical attention (report these to your doctor or health care professional if they continue or are bothersome):  cough  diarrhea  dizziness  headache  joint pain  stomach pain  tiredness This list may not describe all possible side effects. Call your doctor for medical advice about side effects. You may report side effects to FDA at 1-800-FDA-1088. Where should I keep my medicine? This medicine is given in a hospital or clinic and will not be stored at home. NOTE: This sheet is a summary. It may not cover all possible information. If you have questions about   this medicine, talk to your doctor, pharmacist, or health care provider.  2020 Elsevier/Gold Standard (2018-12-27 15:57:59)  

## 2021-03-26 NOTE — Progress Notes (Signed)
Hematology and Oncology Follow Up Visit  Eric [name] [id number] 31-Oct-1940 80 y.o. 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 Maye is here today for follow-up.  He is doing okay.  He does feel more tired.  It has been about 2 months since he was last here.  His hemoglobin is dropped.  Clearly, we will going to have to get him back in 1 more often in order to keep his hemoglobin better so that he will have a better quality of life.  His last ferritin back in May was 2 with an iron saturation of 19%.  We will have to see what his iron studies are today.  He may need to have some IV iron.  He has had no bleeding.  There is been no fever.  His appetite is okay.  He has had no nausea or vomiting.  There has been no cough or shortness of breath.  His blood sugars are on the high side.  He says he is having more trouble with his blood sugars are fluctuating.  Currently, his performance status is ECOG 2.    Medications:  Allergies as of 03/26/2021       Reactions   Iohexol Other (See Comments)    Desc: "shuts kidneys down"   Morphine And Related Shortness Of Breath   Hydrocodone Nausea And Vomiting   Sulfa Antibiotics Other (See Comments)   UNKNOWN REACTION   Sulfonamide Derivatives Other (See Comments)   UNKNOWN REACTION   Trazodone Rash   Pain   Trazodone And Nefazodone Rash   Pain        Medication List        Accurate as of March 26, 2021 12:54 PM. If you have any questions, ask your nurse or doctor.          acetaminophen 325 MG tablet Commonly known as: TYLENOL Take 650 mg by mouth every 6 (six) hours as needed for moderate pain.   allopurinol 100 MG tablet Commonly known as: ZYLOPRIM Take 100 mg by mouth daily.   augmented betamethasone dipropionate 0.05 %  cream Commonly known as: DIPROLENE-AF APPLY TO AFFECTED AREA TWICE A DAY   Eliquis 5 MG Tabs tablet Generic drug: apixaban Take 1 tablet (5 mg total) by mouth 2 (two) times daily.   fluocinonide 0.05 % external solution Commonly known as: LIDEX Apply topically. PRN for rash   hydrOXYzine 25 MG capsule Commonly known as: VISTARIL Take 25-50 mg by mouth at bedtime as needed.   insulin lispro 100 UNIT/ML KwikPen Commonly known as: HUMALOG Inject 9 Units into the skin 3 (three) times daily.   levothyroxine 100 MCG tablet Commonly known as: SYNTHROID Take 50-100 mcg by mouth See admin instructions. Takes 100 mg daily except Sundays, on Sunday takes 50 mg   mupirocin ointment 2 % Commonly known as: BACTROBAN PLEASE SEE ATTACHED FOR DETAILED DIRECTIONS   mupirocin ointment 2 % Commonly known as: BACTROBAN Apply topically.   OneTouch Delica Lancets 02R Misc 4 (four) times daily.   silodosin 8 MG Caps capsule Commonly known as: RAPAFLO Take 8 mg by mouth daily.   simvastatin 20 MG tablet Commonly known as: ZOCOR TAKE 1 TABLET BY MOUTH AT  BEDTIME   spironolactone 50 MG tablet Commonly  known as: ALDACTONE TAKE 1 TABLET BY MOUTH  DAILY   torsemide 20 MG tablet Commonly known as: DEMADEX Take 1 tablet (20 mg total) by mouth in the morning and at bedtime.   Toujeo SoloStar 300 UNIT/ML Solostar Pen Generic drug: insulin glargine (1 Unit Dial) Inject 26 Units into the skin daily. Sliding scale   triamcinolone cream 0.1 % Commonly known as: KENALOG Apply 1 application topically as needed. PRN for rash        Allergies:  Allergies  Allergen Reactions   Iohexol Other (See Comments)     Desc: "shuts kidneys down"    Morphine And Related Shortness Of Breath   Hydrocodone Nausea And Vomiting   Sulfa Antibiotics Other (See Comments)    UNKNOWN REACTION   Sulfonamide Derivatives Other (See Comments)    UNKNOWN REACTION   Trazodone Rash    Pain   Trazodone And  Nefazodone Rash    Pain    Past Medical History, Surgical history, Social history, and Family History were reviewed and updated.  Review of Systems: Review of Systems  Constitutional:  Positive for malaise/fatigue.  HENT: Negative.    Eyes: Negative.   Respiratory:  Positive for shortness of breath.   Cardiovascular:  Positive for palpitations and leg swelling.  Gastrointestinal: Negative.   Genitourinary: Negative.   Musculoskeletal:  Positive for joint pain and myalgias.  Skin:  Positive for rash.  Neurological:  Positive for focal weakness.  Endo/Heme/Allergies: Negative.   Psychiatric/Behavioral: Negative.      Physical Exam:  weight is 238 lb (108 kg). His oral temperature is 97.9 F (36.6 C). His blood pressure is 162/69 (abnormal) and his pulse is 83. His respiration is 18 and oxygen saturation is 100%.   Wt Readings from Last 3 Encounters:  03/26/21 238 lb (108 kg)  01/30/21 225 lb (102.1 kg)  12/19/20 228 lb (103.4 kg)    Physical Exam Vitals reviewed.  HENT:     Head: Normocephalic and atraumatic.  Eyes:     Pupils: Pupils are equal, round, and reactive to light.  Cardiovascular:     Rate and Rhythm: Normal rate and regular rhythm.     Heart sounds: Normal heart sounds.  Pulmonary:     Effort: Pulmonary effort is normal.     Breath sounds: Normal breath sounds.  Abdominal:     General: Bowel sounds are normal.     Palpations: Abdomen is soft.  Musculoskeletal:        General: No tenderness or deformity. Normal range of motion.     Cervical back: Normal range of motion.  Lymphadenopathy:     Cervical: No cervical adenopathy.  Skin:    General: Skin is warm and dry.     Findings: No erythema or rash.  Neurological:     Mental Status: He is alert and oriented to person, place, and time.  Psychiatric:        Behavior: Behavior normal.        Thought Content: Thought content normal.        Judgment: Judgment normal.     Lab Results  Component  Value Date   WBC 4.7 03/26/2021   HGB 9.6 (L) 03/26/2021   HCT 28.2 (L) 03/26/2021   MCV 89.0 03/26/2021   PLT 96 (L) 03/26/2021   Lab Results  Component Value Date   FERRITIN 65 01/30/2021   IRON 72 01/30/2021   TIBC 375 01/30/2021   UIBC 303 01/30/2021   IRONPCTSAT 19 (L)  01/30/2021   Lab Results  Component Value Date   RETICCTPCT 5.0 (H) 03/26/2021   RBC 3.17 (L) 03/26/2021   RBC 3.21 (L) 03/26/2021   Lab Results  Component Value Date   KPAFRELGTCHN 49.7 (H) 02/17/2020   LAMBDASER 40.1 (H) 02/17/2020   KAPLAMBRATIO 6.90 04/30/2020   Lab Results  Component Value Date   IGGSERUM 1,267 04/29/2020   IGA 231 04/29/2020   IGMSERUM 36 04/29/2020   Lab Results  Component Value Date   TOTALPROTELP 6.5 04/29/2020   ALBUMINELP 2.9 02/17/2020   A1GS 0.4 02/17/2020   A2GS 0.7 02/17/2020   BETS 0.8 02/17/2020   GAMS 0.7 02/17/2020   MSPIKE 0.3 (H) 02/17/2020   SPEI Comment 02/17/2020     Chemistry      Component Value Date/Time   NA 137 03/26/2021 1115   K 5.2 (H) 03/26/2021 1115   CL 104 03/26/2021 1115   CO2 27 03/26/2021 1115   BUN 49 (H) 03/26/2021 1115   CREATININE 1.87 (H) 03/26/2021 1115      Component Value Date/Time   CALCIUM 9.4 03/26/2021 1115   ALKPHOS 96 03/26/2021 1115   AST 14 (L) 03/26/2021 1115   ALT 12 03/26/2021 1115   BILITOT 2.8 (H) 03/26/2021 1115       Impression and Plan: Mr. Keelin is a very pleasant 80 yo caucasian gentleman with myelodysplasia -- refractory anemia with ringed sideroblast.   We will go with his Luspatercept again.  He will get Retacrit today.  Again, we will have to see what his iron studies look like.  He may need some IV iron.  I will have to get him back in a month.  I want to make sure that we get him back a little more often so that we can maintain his hemoglobin above 11.    Volanda Napoleon, MD 7/6/202212:54 PM

## 2021-03-27 LAB — FERRITIN: Ferritin: 65 ng/mL (ref 24–336)

## 2021-03-27 LAB — IRON AND TIBC
Iron: 67 ug/dL (ref 42–163)
Saturation Ratios: 20 % (ref 20–55)
TIBC: 336 ug/dL (ref 202–409)
UIBC: 270 ug/dL (ref 117–376)

## 2021-03-28 ENCOUNTER — Telehealth: Payer: Self-pay | Admitting: *Deleted

## 2021-03-28 NOTE — Telephone Encounter (Signed)
Received call from patient stating that he had a bad day of diarrhea yesterday and was in the bathroom all day with diarrhea.  Much better today he says.  No diarrhea today.  Reviewed how to take Imodium and verified that patient was eating and drinking well.  Told patient to call us if anymore issues.

## 2021-04-22 ENCOUNTER — Telehealth: Payer: Self-pay

## 2021-04-22 NOTE — Telephone Encounter (Signed)
Patient called and states he has shingles and is on an antibitoic but wont be able to come in for his appt with Dr.Ennever tomorrow. Called patient back he states he is on the abx for 7 days and is requesting his appt be made for next week, Tuesday or Wednesday preferred. Informed patient scheduling will call to work out a date and time to reschedule him to and to call if he feels symptomatic as his HGB was lower at his last visit.

## 2021-04-23 ENCOUNTER — Inpatient Hospital Stay: Payer: Medicare Other

## 2021-04-23 ENCOUNTER — Inpatient Hospital Stay: Payer: Medicare Other | Admitting: Hematology & Oncology

## 2021-04-27 ENCOUNTER — Encounter (HOSPITAL_COMMUNITY): Payer: Self-pay

## 2021-04-27 ENCOUNTER — Other Ambulatory Visit: Payer: Self-pay

## 2021-04-27 ENCOUNTER — Emergency Department (HOSPITAL_COMMUNITY)
Admission: EM | Admit: 2021-04-27 | Discharge: 2021-04-27 | Disposition: A | Discharge status: Home / Self Care | Payer: Medicare Other | Attending: Emergency Medicine | Admitting: Emergency Medicine

## 2021-04-27 DIAGNOSIS — E039 Hypothyroidism, unspecified: Secondary | ICD-10-CM | POA: Insufficient documentation

## 2021-04-27 DIAGNOSIS — I5033 Acute on chronic diastolic (congestive) heart failure: Secondary | ICD-10-CM | POA: Diagnosis not present

## 2021-04-27 DIAGNOSIS — I2510 Atherosclerotic heart disease of native coronary artery without angina pectoris: Secondary | ICD-10-CM | POA: Insufficient documentation

## 2021-04-27 DIAGNOSIS — Z794 Long term (current) use of insulin: Secondary | ICD-10-CM | POA: Insufficient documentation

## 2021-04-27 DIAGNOSIS — R195 Other fecal abnormalities: Secondary | ICD-10-CM | POA: Insufficient documentation

## 2021-04-27 DIAGNOSIS — N184 Chronic kidney disease, stage 4 (severe): Secondary | ICD-10-CM | POA: Diagnosis not present

## 2021-04-27 DIAGNOSIS — E1142 Type 2 diabetes mellitus with diabetic polyneuropathy: Secondary | ICD-10-CM | POA: Insufficient documentation

## 2021-04-27 DIAGNOSIS — I130 Hypertensive heart and chronic kidney disease with heart failure and stage 1 through stage 4 chronic kidney disease, or unspecified chronic kidney disease: Secondary | ICD-10-CM | POA: Insufficient documentation

## 2021-04-27 DIAGNOSIS — Z79899 Other long term (current) drug therapy: Secondary | ICD-10-CM | POA: Diagnosis not present

## 2021-04-27 DIAGNOSIS — Z7901 Long term (current) use of anticoagulants: Secondary | ICD-10-CM | POA: Diagnosis not present

## 2021-04-27 DIAGNOSIS — Z96641 Presence of right artificial hip joint: Secondary | ICD-10-CM | POA: Insufficient documentation

## 2021-04-27 DIAGNOSIS — E1122 Type 2 diabetes mellitus with diabetic chronic kidney disease: Secondary | ICD-10-CM | POA: Diagnosis not present

## 2021-04-27 HISTORY — DX: Zoster without complications: B02.9

## 2021-04-27 LAB — TYPE AND SCREEN
ABO/RH(D): O POS
Antibody Screen: POSITIVE
PT AG Type: NEGATIVE

## 2021-04-27 LAB — COMPREHENSIVE METABOLIC PANEL
ALT: 12 U/L (ref 0–44)
AST: 15 U/L (ref 15–41)
Albumin: 4 g/dL (ref 3.5–5.0)
Alkaline Phosphatase: 89 U/L (ref 38–126)
Anion gap: 5 (ref 5–15)
BUN: 34 mg/dL — ABNORMAL HIGH (ref 8–23)
CO2: 27 mmol/L (ref 22–32)
Calcium: 8.7 mg/dL — ABNORMAL LOW (ref 8.9–10.3)
Chloride: 106 mmol/L (ref 98–111)
Creatinine, Ser: 1.65 mg/dL — ABNORMAL HIGH (ref 0.61–1.24)
GFR, Estimated: 42 mL/min — ABNORMAL LOW (ref >60)
Glucose, Bld: 145 mg/dL — ABNORMAL HIGH (ref 70–99)
Potassium: 4.5 mmol/L (ref 3.5–5.1)
Sodium: 138 mmol/L (ref 135–145)
Total Bilirubin: 2.7 mg/dL — ABNORMAL HIGH (ref 0.3–1.2)
Total Protein: 6.8 g/dL (ref 6.5–8.1)

## 2021-04-27 LAB — CBC WITH DIFFERENTIAL/PLATELET
Abs Immature Granulocytes: 0.02 10*3/uL (ref 0.00–0.07)
Basophils Absolute: 0 10*3/uL (ref 0.0–0.1)
Basophils Relative: 1 %
Eosinophils Absolute: 0.1 10*3/uL (ref 0.0–0.5)
Eosinophils Relative: 3 %
HCT: 31.6 % — ABNORMAL LOW (ref 39.0–52.0)
Hemoglobin: 10.4 g/dL — ABNORMAL LOW (ref 13.0–17.0)
Immature Granulocytes: 1 %
Lymphocytes Relative: 23 %
Lymphs Abs: 1 10*3/uL (ref 0.7–4.0)
MCH: 29.3 pg (ref 26.0–34.0)
MCHC: 32.9 g/dL (ref 30.0–36.0)
MCV: 89 fL (ref 80.0–100.0)
Monocytes Absolute: 0.4 10*3/uL (ref 0.1–1.0)
Monocytes Relative: 10 %
Neutro Abs: 2.7 10*3/uL (ref 1.7–7.7)
Neutrophils Relative %: 62 %
Platelets: 116 10*3/uL — ABNORMAL LOW (ref 150–400)
RBC: 3.55 MIL/uL — ABNORMAL LOW (ref 4.22–5.81)
RDW: 16.8 % — ABNORMAL HIGH (ref 11.5–15.5)
WBC: 4.3 10*3/uL (ref 4.0–10.5)
nRBC: 0 % (ref 0.0–0.2)

## 2021-04-27 LAB — PROTIME-INR
INR: 1.2 (ref 0.8–1.2)
Prothrombin Time: 15.1 seconds (ref 11.4–15.2)

## 2021-04-27 LAB — POC OCCULT BLOOD, ED: Fecal Occult Bld: NEGATIVE

## 2021-04-27 NOTE — ED Provider Notes (Signed)
[facility] DEPT Provider Note   CSN: [id number] Arrival date & time: 04/27/21  0544     History Chief Complaint  Patient presents with   Rectal Bleeding    Eric [name] is a 80 y.o. male.  Presents to the emergency department for evaluation of possible GI bleed.  Patient reports that he had an episode of passing black tarry stools tonight.  He does take Eliquis secondary to history of atrial fibrillation.  Last dose was between 5 and 6 PM yesterday.  Patient denies abdominal pain.  He has not had any vomiting, no hematemesis.      Past Medical History:  Diagnosis Date   Abnormal results of liver function studies 04/24/2014   Acquired hypothyroidism 04/24/2014   Acute on chronic diastolic heart failure ([place]) 02/12/2020   5/24-5/31/2021 acute on chronic heart failure in the context of chronic venous insufficiency and acute on CKD.  IV diuresis resulted in output of 6.9 L.   AKI (acute kidney injury) ([place]) 06/29/2019   Anemia    Atrial fibrillation ([place]) 01/15/2009   Qualifier: Diagnosis of  By: Lynnell Jude RN BSN, [employer] of this note might be different from the original. Overview:  Qualifier: Diagnosis of  By: Lynnell Jude RN BSN, [employer] of this note might be different from the original. 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 Overview:  EF 50-55%  Overview:  EF 50-55% Formatting of this note might be different from the  original. EF 50-55%   Chronic atrial fibrillation ([place])    Chronic coronary artery disease 04/24/2014   Formatting of this note might be different from the original. Overview:  Cath 2003 . Moderate - LAD 60% Formatting of this note might be different from the original. Cath 2003 [facility]. Moderate - LAD 60%   Chronic kidney disease, stage 3b ([place]) 06/28/2018   Colon polyp 07/08/2018   Current use of long term anticoagulation 10/12/2018   Degenerative arthritis of knee, bilateral 06/06/2014   Diabetes with neurologic complications ([place]) 73/71/0626   Diabetes, polyneuropathy ([place]) 94/85/4627   Diastolic heart failure ([place])    DIASTOLIC HEART FAILURE, CHRONIC 02/07/2009   Qualifier: Diagnosis of  By: Caryl Comes, MD, [facility], Mack Guise   Formatting of this note might be different from the original. Overview:  Overview:  Qualifier: Diagnosis of  By: Caryl Comes, MD, [facility], Mack Guise Formatting of this note might be different from the original. 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 The test differentiates between high school or less education level in reference to presence or absence of  dementia. For high school education a score of 27-30 is normal, 21-26 is minimal neurocognitive deficit and 1-20 suggests dementia. With less than a high school education similar sco   Obesity 02/06/2009   Qualifier: Diagnosis of  By: Hollie Salk CMA, Monika Salk of this note might be different from the original. 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 Formatting of this note might be different from the original. Qualifier: Diagnosis of  By: Hollie Salk CMA, Estill Bamberg   Last Assessment & Plan:  Formatting of this note might be different from the original. 02/12/2020 A1c 8.4% which is adequate control with age & advanced co-morbidities Glucos   Shingles    Skin lesion of left leg 03/21/2014   Tendinitis of right shoulder 07/18/2014   Thoracic or lumbosacral neuritis or radiculitis, unspecified 04/24/2014   Formatting of this note might be different from the original. Possible right L5 or S1   Thrombocytopenia ([place]) 02/20/2020   Platelet count varied from 73,000 up to 102,000 as inpatient.  Hematology outpatient consultation on 02/29/2020.   Formatting of this note might be different from the original. Formatting of this note might be different from the original. Platelet count varied from 73,000 up to 102,000 as inpatient.  Hematology outpatient consultation on 02/29/2020.    Last Assessment & Plan:  Formatting of this note   Type 2 diabetes mellitus with stage 3b chronic kidney disease, with long-term current use of insulin ([place]) 02/25/2013   Venous insufficiency 02/19/2020   Formatting of this note might be different from the original. Last Assessment & Plan:  Formatting of this note might be different from the original. Unna boots will be continued at the SNF under the supervision of the wound care nurse.  Outpatient follow-up clinic in the vein clinic will be arranged. 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   Thrombocytopenia ([place]) 02/20/2020   Neurocognitive deficits 02/20/2020   Venous insufficiency 02/19/2020   Acute on chronic  diastolic heart failure ([place]) 02/12/2020   AKI (acute kidney injury) ([place]) 06/29/2019   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   Current use of long term anticoagulation 10/12/2018   Gall bladder disease 2020   Dry skin 08/23/2018   Hyperkalemia 07/13/2018   Elevated liver enzymes 07/09/2018   Colon polyp 07/08/2018   Internal hemorrhoids 07/08/2018   Chronic kidney disease, stage 3b ([place]) 06/28/2018   Proteinuria 06/28/2018   Benign hypertension with CKD (chronic kidney disease) stage IV ([place]) 06/28/2018   History of colon polyps 06/01/2018   Change in stool 05/26/2018   Bilateral edema of lower extremity 04/06/2018   Influenza A 11/01/2017   Primary insomnia 12/19/2014   Vitamin B12 deficiency 09/26/2014   B12 deficiency anemia  09/19/2014   Tendinitis of right shoulder 07/18/2014   Degenerative arthritis of knee, bilateral 06/06/2014   Hip pain 06/06/2014   Abnormal results of liver function studies 04/24/2014   Acquired hypothyroidism 04/24/2014   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   Bulging lumbar disc 04/24/2014   CHF (congestive heart failure) ([place]) 04/24/2014   Gout 04/24/2014   Chronic coronary artery disease 04/24/2014   Diabetes with neurologic complications ([place]) 97/74/1423   Diabetes, polyneuropathy ([place]) 04/24/2014   Disorder of kidney and ureter, unspecified 04/24/2014   Gallstones 04/24/2014   Hearing loss 04/24/2014   Nasal vestibulitis 04/24/2014   Osteoarthritis of hip, unspecified 04/24/2014   Other and unspecified hyperlipidemia 04/24/2014   Pain in joint, pelvic region and thigh 04/24/2014   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   Thoracic or lumbosacral neuritis or radiculitis, unspecified 04/24/2014   Cellulitis of arm 03/21/2014   Skin lesion of left leg 03/21/2014   Type 2 diabetes mellitus with stage 3b chronic kidney disease, with long-term current use of insulin ([place]) 02/25/2013   Primary localized osteoarthrosis, pelvic region and thigh 02/25/2013   Sacroiliitis ([place]) 95/32/0233   DIASTOLIC HEART FAILURE, CHRONIC 02/07/2009   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[place]) 02/06/2009   Obesity 02/06/2009   Benign essential hypertension 02/06/2009   Atrial fibrillation ([place]) 01/15/2009    Past Surgical History:  Procedure Laterality Date   APPENDECTOMY     CHOLECYSTECTOMY     TOTAL HIP ARTHROPLASTY         Family History  Problem Relation Age of Onset   Heart disease Mother    Congestive Heart Failure Mother    Alzheimer's disease Father     Social History   Tobacco Use   Smoking status: Never   Smokeless tobacco: Never  Vaping Use   Vaping Use: Never used  Substance Use Topics   Alcohol use: Never   Drug use:  Never    Home Medications Prior to Admission medications   Medication Sig Start Date End Date Taking? Authorizing Provider  acetaminophen (TYLENOL) 325 MG tablet Take 650 mg by mouth every 6 (six) hours as needed for moderate pain.     [provider]  allopurinol (ZYLOPRIM) 100 MG tablet Take 100 mg by mouth daily. 05/15/20   [provider]  augmented betamethasone dipropionate (DIPROLENE-AF) 0.05 % cream APPLY TO AFFECTED AREA TWICE A DAY 01/01/21   [provider]  ELIQUIS 5 MG TABS tablet Take 1 tablet (5 mg total) by mouth 2 (two) times daily. 02/10/21   Richardo Priest, MD  fluocinonide (LIDEX) 0.05 % external solution Apply topically. PRN for rash 10/30/20   [provider]  hydrOXYzine (VISTARIL) 25  MG capsule Take 25-50 mg by mouth at bedtime as needed. 03/17/21   [provider]  insulin glargine, 1 Unit Dial, (TOUJEO SOLOSTAR) 300 UNIT/ML Solostar Pen Inject 26 Units into the skin daily. Sliding scale 08/31/15   [provider]  insulin lispro (HUMALOG) 100 UNIT/ML KwikPen Inject 9 Units into the skin 3 (three) times daily. 04/15/15   [provider]  levothyroxine (SYNTHROID) 100 MCG tablet Take 50-100 mcg by mouth See admin instructions. Takes 100 mg daily except Sundays, on Sunday takes 50 mg 01/14/20   [provider]  mupirocin ointment (BACTROBAN) 2 % PLEASE SEE ATTACHED FOR DETAILED DIRECTIONS 11/28/20   [provider]  mupirocin ointment (BACTROBAN) 2 % Apply topically. 11/28/20   [provider]  OneTouch Delica Lancets 09T MISC 4 (four) times daily. 10/17/20   [provider]  silodosin (RAPAFLO) 8 MG CAPS capsule Take 8 mg by mouth daily.  12/13/19   [provider]  simvastatin (ZOCOR) 20 MG tablet TAKE 1 TABLET BY MOUTH AT  BEDTIME 12/06/20   Richardo Priest, MD  spironolactone (ALDACTONE) 50 MG tablet TAKE 1 TABLET BY MOUTH  DAILY 12/06/20   Richardo Priest, MD  torsemide  (DEMADEX) 20 MG tablet Take 1 tablet (20 mg total) by mouth in the morning and at bedtime. 06/03/20   Richardo Priest, MD  triamcinolone (KENALOG) 0.1 % Apply 1 application topically as needed. PRN for rash 09/17/20   [provider]  traZODone (DESYREL) 50 MG tablet  03/14/20 04/19/20  [provider]    Allergies    Iohexol, Morphine and related, Hydrocodone, Sulfa antibiotics, Sulfonamide derivatives, Trazodone, and Trazodone and nefazodone  Review of Systems   Review of Systems  Gastrointestinal:  Negative for abdominal pain.  All other systems reviewed and are negative.  Physical Exam Updated Vital Signs BP (!) 169/77   Pulse (!) 43   Temp (!) 97.5 F (36.4 C) (Oral)   Resp 15   Ht 6' (1.829 m)   Wt 107.5 kg   SpO2 100%   BMI 32.14 kg/m   Physical Exam Vitals and nursing note reviewed.  Constitutional:      General: He is not in acute distress.    Appearance: Normal appearance. He is well-developed.  HENT:     Head: Normocephalic and atraumatic.     Right Ear: Hearing normal.     Left Ear: Hearing normal.     Nose: Nose normal.  Eyes:     Conjunctiva/sclera: Conjunctivae normal.     Pupils: Pupils are equal, round, and reactive to light.  Cardiovascular:     Rate and Rhythm: Regular rhythm.     Heart sounds: S1 normal and S2 normal. No murmur heard.   No friction rub. No gallop.  Pulmonary:     Effort: Pulmonary effort is normal. No respiratory distress.     Breath sounds: Normal breath sounds.  Chest:     Chest wall: No tenderness.  Abdominal:     General: Bowel sounds are normal.     Palpations: Abdomen is soft.     Tenderness: There is no abdominal tenderness. There is no guarding or rebound. Negative signs include Murphy's sign and McBurney's sign.     Hernia: No hernia is present.  Genitourinary:    Prostate: Not tender and no nodules present.     Comments: Brown stool, not melena Musculoskeletal:        General: Normal range of  motion.  Cervical back: Normal range of motion and neck supple.  Skin:    General: Skin is warm and dry.     Findings: No rash.  Neurological:     Mental Status: He is alert and oriented to person, place, and time.     GCS: GCS eye subscore is 4. GCS verbal subscore is 5. GCS motor subscore is 6.     Cranial Nerves: No cranial nerve deficit.     Sensory: No sensory deficit.     Coordination: Coordination normal.  Psychiatric:        Speech: Speech normal.        Behavior: Behavior normal.        Thought Content: Thought content normal.    ED Results / Procedures / Treatments   Labs (all labs ordered are listed, but only abnormal results are displayed) Labs Reviewed  COMPREHENSIVE METABOLIC PANEL - Abnormal; Notable for the following components:      Result Value   Glucose, Bld 145 (*)    BUN 34 (*)    Creatinine, Ser 1.65 (*)    Calcium 8.7 (*)    Total Bilirubin 2.7 (*)    GFR, Estimated 42 (*)    All other components within normal limits  CBC WITH DIFFERENTIAL/PLATELET - Abnormal; Notable for the following components:   RBC 3.55 (*)    Hemoglobin 10.4 (*)    HCT 31.6 (*)    RDW 16.8 (*)    Platelets 116 (*)    All other components within normal limits  PROTIME-INR  POC OCCULT BLOOD, ED  TYPE AND SCREEN    EKG None  Radiology No results found.  Procedures Procedures   Medications Ordered in ED Medications - No data to display  ED Course  I have reviewed the triage vital signs and the nursing notes.  Pertinent labs & imaging results that were available during my care of the patient were reviewed by me and considered in my medical decision making (see chart for details).    MDM Rules/Calculators/A&P                           Patient presented to the emergency department with concerns over possible GI bleed.  Patient reports that he had a bowel movement that was black and malodorous.  He does take Eliquis.  He has not had any abdominal pain.  Abdominal  exam is benign.  Vitals are normal except for mild hypertension which is correcting without intervention.  Rectal exam revealed brown stool that was heme-negative.  Hemoglobin is stable, consistent with prior CBCs.  Evaluation does not reveal any evidence of GI bleed.  No further work-up necessary.  Final Clinical Impression(s) / ED Diagnoses Final diagnoses:  Chronic anticoagulation    Rx / DC Orders ED Discharge Orders     None        Nickolas Chalfin, Gwenyth Allegra, MD 04/27/21 [phone]

## 2021-04-27 NOTE — Discharge Instructions (Addendum)
Your tests did not indicate any bleeding in your stools today.  You may take your medications as prescribed.  If you have any changes, concerns, return for repeat evaluation.

## 2021-04-27 NOTE — ED Triage Notes (Signed)
Pt BIB [facility] from home c/o black, tarry stools x1 hr. No hx of same. Taking eliquis 5mg  BID. Minimal pain only with defecation. A&Ox4. 18ga LF. 12-lead unremarkable  BP 142/40 HR 64 RR 18 SpO2 98% RA CBG 186

## 2021-04-29 ENCOUNTER — Encounter: Payer: Self-pay | Admitting: Hematology & Oncology

## 2021-05-09 ENCOUNTER — Telehealth: Payer: Self-pay | Admitting: *Deleted

## 2021-05-09 ENCOUNTER — Inpatient Hospital Stay: Payer: Medicare Other | Attending: Hematology & Oncology

## 2021-05-09 ENCOUNTER — Inpatient Hospital Stay: Payer: Medicare Other

## 2021-05-09 ENCOUNTER — Inpatient Hospital Stay (HOSPITAL_BASED_OUTPATIENT_CLINIC_OR_DEPARTMENT_OTHER): Payer: Medicare Other | Admitting: Family

## 2021-05-09 ENCOUNTER — Other Ambulatory Visit: Payer: Self-pay

## 2021-05-09 ENCOUNTER — Encounter: Payer: Self-pay | Admitting: Family

## 2021-05-09 VITALS — BP 136/61 | HR 63 | Temp 98.0°F | Resp 18 | Ht 72.0 in | Wt 236.0 lb

## 2021-05-09 DIAGNOSIS — Z79899 Other long term (current) drug therapy: Secondary | ICD-10-CM | POA: Diagnosis not present

## 2021-05-09 DIAGNOSIS — D509 Iron deficiency anemia, unspecified: Secondary | ICD-10-CM | POA: Diagnosis not present

## 2021-05-09 DIAGNOSIS — D4620 Refractory anemia with excess of blasts, unspecified: Secondary | ICD-10-CM

## 2021-05-09 DIAGNOSIS — D469 Myelodysplastic syndrome, unspecified: Secondary | ICD-10-CM | POA: Insufficient documentation

## 2021-05-09 DIAGNOSIS — D461 Refractory anemia with ring sideroblasts: Secondary | ICD-10-CM

## 2021-05-09 DIAGNOSIS — D518 Other vitamin B12 deficiency anemias: Secondary | ICD-10-CM

## 2021-05-09 LAB — CBC WITH DIFFERENTIAL (CANCER CENTER ONLY)
Abs Immature Granulocytes: 0.04 10*3/uL (ref 0.00–0.07)
Basophils Absolute: 0 10*3/uL (ref 0.0–0.1)
Basophils Relative: 1 %
Eosinophils Absolute: 0.1 10*3/uL (ref 0.0–0.5)
Eosinophils Relative: 3 %
HCT: 29.5 % — ABNORMAL LOW (ref 39.0–52.0)
Hemoglobin: 9.9 g/dL — ABNORMAL LOW (ref 13.0–17.0)
Immature Granulocytes: 1 %
Lymphocytes Relative: 20 %
Lymphs Abs: 0.9 10*3/uL (ref 0.7–4.0)
MCH: 29.7 pg (ref 26.0–34.0)
MCHC: 33.6 g/dL (ref 30.0–36.0)
MCV: 88.6 fL (ref 80.0–100.0)
Monocytes Absolute: 0.4 10*3/uL (ref 0.1–1.0)
Monocytes Relative: 8 %
Neutro Abs: 3.1 10*3/uL (ref 1.7–7.7)
Neutrophils Relative %: 67 %
Platelet Count: 99 10*3/uL — ABNORMAL LOW (ref 150–400)
RBC: 3.33 MIL/uL — ABNORMAL LOW (ref 4.22–5.81)
RDW: 16.5 % — ABNORMAL HIGH (ref 11.5–15.5)
WBC Count: 4.6 10*3/uL (ref 4.0–10.5)
nRBC: 0 % (ref 0.0–0.2)

## 2021-05-09 LAB — CMP (CANCER CENTER ONLY)
ALT: 9 U/L (ref 0–44)
AST: 15 U/L (ref 15–41)
Albumin: 4 g/dL (ref 3.5–5.0)
Alkaline Phosphatase: 91 U/L (ref 38–126)
Anion gap: 8 (ref 5–15)
BUN: 48 mg/dL — ABNORMAL HIGH (ref 8–23)
CO2: 27 mmol/L (ref 22–32)
Calcium: 9.4 mg/dL (ref 8.9–10.3)
Chloride: 107 mmol/L (ref 98–111)
Creatinine: 1.97 mg/dL — ABNORMAL HIGH (ref 0.61–1.24)
GFR, Estimated: 34 mL/min — ABNORMAL LOW (ref >60)
Glucose, Bld: 128 mg/dL — ABNORMAL HIGH (ref 70–99)
Potassium: 4.3 mmol/L (ref 3.5–5.1)
Sodium: 142 mmol/L (ref 135–145)
Total Bilirubin: 2.6 mg/dL — ABNORMAL HIGH (ref 0.3–1.2)
Total Protein: 6.6 g/dL (ref 6.5–8.1)

## 2021-05-09 LAB — RETICULOCYTES
Immature Retic Fract: 16.4 % — ABNORMAL HIGH (ref 2.3–15.9)
RBC.: 3.32 MIL/uL — ABNORMAL LOW (ref 4.22–5.81)
Retic Count, Absolute: 179.3 10*3/uL (ref 19.0–186.0)
Retic Ct Pct: 5.4 % — ABNORMAL HIGH (ref 0.4–3.1)

## 2021-05-09 LAB — LACTATE DEHYDROGENASE: LDH: 238 U/L — ABNORMAL HIGH (ref 98–192)

## 2021-05-09 MED ORDER — EPOETIN ALFA-EPBX 40000 UNIT/ML IJ SOLN
40000.0000 [IU] | Freq: Once | INTRAMUSCULAR | Status: AC
  Administered 2021-05-09: 40000 [IU] via SUBCUTANEOUS
  Filled 2021-05-09: qty 1

## 2021-05-09 MED ORDER — LUSPATERCEPT-AAMT 75 MG ~~LOC~~ SOLR
100.0000 mg | Freq: Once | SUBCUTANEOUS | Status: AC
  Administered 2021-05-09: 100 mg via SUBCUTANEOUS
  Filled 2021-05-09: qty 1.5

## 2021-05-09 NOTE — Patient Instructions (Signed)
Luspatercept injection What is this medicine? LUSPATERCEPT (lus PAT er sept) helps your body make more red blood cells. This medicine is used to treat anemia caused by beta thalassemia or myelodysplastic syndromes. This medicine may be used for other purposes; ask your health care provider or pharmacist if you have questions. COMMON BRAND NAME(S): REBLOZYL What should I tell my health care provider before I take this medicine? They need to know if you have any of these conditions:  cigarette smoker  have had your spleen removed  high blood pressure  history of blood clots  an unusual or allergic reaction to luspatercept, other medicines, foods, dyes or preservatives  pregnant or trying to get pregnant  breast-feeding How should I use this medicine? This medicine is for injection under the skin. It is given by a healthcare professional in a hospital or clinic setting. Talk to your pediatrician about the use of the medicine in children. This medicine is not approved for use in children. Overdosage: If you think you have taken too much of this medicine contact a poison control center or emergency room at once. NOTE: This medicine is only for you. Do not share this medicine with others. What if I miss a dose? Keep appointments for follow-up doses. It is important not to miss your dose. Call your doctor or healthcare professional if you are unable to keep an appointment. What may interact with this medicine? Interactions are not expected. This list may not describe all possible interactions. Give your health care provider a list of all the medicines, herbs, non-prescription drugs, or dietary supplements you use. Also tell them if you smoke, drink alcohol, or use illegal drugs. Some items may interact with your medicine. What should I watch for while using this medicine? Your condition will be monitored carefully while you are receiving this medicine. Do not become pregnant while taking  this medicine or for 3 months after stopping it. Women should inform their healthcare professional if they wish to become pregnant or think they might be pregnant. There is a potential for serious side effects and harm to an unborn child. Talk to your healthcare professional for more information. Do not breast-feed an infant while taking this medicine. You may need blood work done while you are taking this medicine. What side effects may I notice from receiving this medicine? Side effects that you should report to your doctor or health care professional as soon as possible:  allergic reactions like skin rash, itching or hives; swelling of the face, lips, or tongue  signs and symptoms of a blood clot such as chest pain; shortness of breath; pain, swelling, or warmth in the leg  signs and symptoms of a stroke like changes in vision; confusion; trouble speaking or understanding; severe headaches; sudden numbness or weakness of the face, arm or leg; trouble walking; dizziness; loss of balance or coordination Side effects that usually do not require medical attention (report these to your doctor or health care professional if they continue or are bothersome):  cough  diarrhea  dizziness  headache  joint pain  stomach pain  tiredness This list may not describe all possible side effects. Call your doctor for medical advice about side effects. You may report side effects to FDA at 1-800-FDA-1088. Where should I keep my medicine? This medicine is given in a hospital or clinic and will not be stored at home. NOTE: This sheet is a summary. It may not cover all possible information. If you have questions about 

## 2021-05-09 NOTE — Telephone Encounter (Signed)
Per 05/09/21 los - gave upcoming appointments - confirmed - print calendar

## 2021-05-09 NOTE — Progress Notes (Signed)
Hematology and Oncology Follow Up Visit  Eric [name] [id number] [birth year] 80 y.o. 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 He has had some fatigue and mild SOB with exertion. He takes a break to rest when needed.  He was recently in the ED fatigue and was afraid he had another GI bleed. His counts were stable and his hemoccult test was negative.  He has not noted any obvious blood loss. He bruises easily on Eliquis and is still on his same dose per cardiology.  He has a fungal rash effecting his chest and is currently on Diflucan for 30 days and has topical Ketoconazole cream to apply to his chest and follows up with Dr. Lorne Skeens.  No fever, chills, n/v, cough, rash, chest pain, palpitations, abdominal pain or changes in bowel or bladder habits.  He has chronic swelling in his feet and ankles that is stable/unchanged. Pedal pulses are 2+.  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  He has occasional dizziness. No falls or syncope to report.  He has a good appetite and is staying well hydrated throughout the day. His weight is stable at 236 lbs.   ECOG Performance Status: 1 - Symptomatic but completely ambulatory  Medications:  Allergies as of 05/09/2021       Reactions   Iohexol Other (See Comments)    Desc: "shuts kidneys down"   Morphine And Related Shortness Of Breath   Hydrocodone Nausea And Vomiting   Sulfa Antibiotics Other (See Comments)   UNKNOWN REACTION   Sulfonamide Derivatives Other (See Comments)   UNKNOWN REACTION   Trazodone Rash   Pain   Trazodone And Nefazodone Rash   Pain        Medication List        Accurate as of May 09, 2021  2:17 PM. If you have any questions,  ask your nurse or doctor.          acetaminophen 325 MG tablet Commonly known as: TYLENOL Take 650 mg by mouth every 6 (six) hours as needed for moderate pain.   allopurinol 100 MG tablet Commonly known as: ZYLOPRIM Take 100 mg by mouth daily.   augmented betamethasone dipropionate 0.05 % cream Commonly known as: DIPROLENE-AF APPLY TO AFFECTED AREA TWICE A DAY   Eliquis 5 MG Tabs tablet Generic drug: apixaban Take 1 tablet (5 mg total) by mouth 2 (two) times daily.   fluocinonide 0.05 % external solution Commonly known as: LIDEX Apply topically. PRN for rash   hydrOXYzine 25 MG capsule Commonly known as: VISTARIL Take 25-50 mg by mouth at bedtime as needed.   insulin lispro 100 UNIT/ML KwikPen Commonly known as: HUMALOG Inject 9 Units into the skin 3 (three) times daily.   levothyroxine 100 MCG tablet Commonly known as: SYNTHROID Take 50-100 mcg by mouth See admin instructions. Takes 100 mg daily except Sundays, on Sunday takes 50 mg   mupirocin ointment 2 % Commonly known as: BACTROBAN PLEASE SEE ATTACHED FOR DETAILED DIRECTIONS   mupirocin ointment 2 % Commonly known as: BACTROBAN Apply topically.   OneTouch Delica Lancets 62X Misc 4 (four) times daily.   silodosin 8 MG Caps capsule Commonly known  as: RAPAFLO Take 8 mg by mouth daily.   simvastatin 20 MG tablet Commonly known as: ZOCOR TAKE 1 TABLET BY MOUTH AT  BEDTIME   spironolactone 50 MG tablet Commonly known as: ALDACTONE TAKE 1 TABLET BY MOUTH  DAILY   torsemide 20 MG tablet Commonly known as: DEMADEX Take 1 tablet (20 mg total) by mouth in the morning and at bedtime.   Toujeo SoloStar 300 UNIT/ML Solostar Pen Generic drug: insulin glargine (1 Unit Dial) Inject 26 Units into the skin daily. Sliding scale   triamcinolone cream 0.1 % Commonly known as: KENALOG Apply 1 application topically as needed. PRN for rash        Allergies:  Allergies  Allergen Reactions   Iohexol Other  (See Comments)     Desc: "shuts kidneys down"    Morphine And Related Shortness Of Breath   Hydrocodone Nausea And Vomiting   Sulfa Antibiotics Other (See Comments)    UNKNOWN REACTION   Sulfonamide Derivatives Other (See Comments)    UNKNOWN REACTION   Trazodone Rash    Pain   Trazodone And Nefazodone Rash    Pain    Past Medical History, Surgical history, Social history, and Family History were reviewed and updated.  Review of Systems: All other 10 point review of systems is negative.   Physical Exam:  vitals were not taken for this visit.   Wt Readings from Last 3 Encounters:  04/27/21 237 lb (107.5 kg)  03/26/21 238 lb (108 kg)  01/30/21 225 lb (102.1 kg)    Ocular: Sclerae unicteric, pupils equal, round and reactive to light Ear-nose-throat: Oropharynx clear, dentition fair Lymphatic: No cervical or supraclavicular adenopathy Lungs no rales or rhonchi, good excursion bilaterally Heart regular rate and rhythm, no murmur appreciated Abd soft, nontender, positive bowel sounds MSK no focal spinal tenderness, no joint edema Neuro: non-focal, well-oriented, appropriate affect Breasts: Deferred   Lab Results  Component Value Date   WBC 4.6 05/09/2021   HGB 9.9 (L) 05/09/2021   HCT 29.5 (L) 05/09/2021   MCV 88.6 05/09/2021   PLT 99 (L) 05/09/2021   Lab Results  Component Value Date   FERRITIN 65 03/26/2021   IRON 67 03/26/2021   TIBC 336 03/26/2021   UIBC 270 03/26/2021   IRONPCTSAT 20 03/26/2021   Lab Results  Component Value Date   RETICCTPCT 5.4 (H) 05/09/2021   RBC 3.33 (L) 05/09/2021   Lab Results  Component Value Date   KPAFRELGTCHN 49.7 (H) 02/17/2020   LAMBDASER 40.1 (H) 02/17/2020   KAPLAMBRATIO 6.90 04/30/2020   Lab Results  Component Value Date   IGGSERUM 1,267 04/29/2020   IGA 231 04/29/2020   IGMSERUM 36 04/29/2020   Lab Results  Component Value Date   TOTALPROTELP 6.5 04/29/2020   ALBUMINELP 2.9 02/17/2020   A1GS 0.4 02/17/2020    A2GS 0.7 02/17/2020   BETS 0.8 02/17/2020   GAMS 0.7 02/17/2020   MSPIKE 0.3 (H) 02/17/2020   SPEI Comment 02/17/2020     Chemistry      Component Value Date/Time   NA 138 04/27/2021 0615   K 4.5 04/27/2021 0615   CL 106 04/27/2021 0615   CO2 27 04/27/2021 0615   BUN 34 (H) 04/27/2021 0615   CREATININE 1.65 (H) 04/27/2021 0615   CREATININE 1.87 (H) 03/26/2021 1115      Component Value Date/Time   CALCIUM 8.7 (L) 04/27/2021 0615   ALKPHOS 89 04/27/2021 0615   AST 15 04/27/2021 0615   AST 14 (  L) 03/26/2021 1115   ALT 12 04/27/2021 0615   ALT 12 03/26/2021 1115   BILITOT 2.7 (H) 04/27/2021 0615   BILITOT 2.8 (H) 03/26/2021 1115       Impression and Plan: Mr. Noon is a very pleasant 80 yo caucasian gentleman with myelodysplasia -- refractory anemia with ringed sideroblast.  He received Retacrit and Luspatercept today, Hgb 9.9, MCV 88, platelets 99 and WBC count is 4.6.  Iron studies are pending. We will replace if needed.  Follow-up in 1 month.  He can contact our office with any questions or concerns.   Laverna Peace, NP 8/19/20222:17 PM

## 2021-05-12 ENCOUNTER — Telehealth: Payer: Self-pay

## 2021-05-12 LAB — IRON AND TIBC
Iron: 61 ug/dL (ref 42–163)
Saturation Ratios: 17 % — ABNORMAL LOW (ref 20–55)
TIBC: 361 ug/dL (ref 202–409)
UIBC: 300 ug/dL (ref 117–376)

## 2021-05-12 LAB — FERRITIN: Ferritin: 53 ng/mL (ref 24–336)

## 2021-05-12 NOTE — Telephone Encounter (Signed)
-----   Message from Volanda Napoleon, MD sent at 05/12/2021 11:47 AM EDT ----- Call - He needs a dose of IV iron!! Laurey Arrow

## 2021-05-12 NOTE — Telephone Encounter (Signed)
Called and informed patient of lab results, patient verbalized understanding and denies any questions or concerns at this time.  Message sent to scheduling to call with apt.

## 2021-05-13 ENCOUNTER — Other Ambulatory Visit: Payer: Self-pay | Admitting: Family

## 2021-05-14 DIAGNOSIS — B029 Zoster without complications: Secondary | ICD-10-CM | POA: Insufficient documentation

## 2021-05-15 ENCOUNTER — Inpatient Hospital Stay: Payer: Medicare Other | Attending: Hematology & Oncology

## 2021-05-15 ENCOUNTER — Other Ambulatory Visit: Payer: Self-pay

## 2021-05-15 VITALS — BP 172/62 | HR 75 | Temp 97.8°F | Resp 18

## 2021-05-15 DIAGNOSIS — D469 Myelodysplastic syndrome, unspecified: Secondary | ICD-10-CM | POA: Diagnosis present

## 2021-05-15 DIAGNOSIS — Z79899 Other long term (current) drug therapy: Secondary | ICD-10-CM | POA: Insufficient documentation

## 2021-05-15 DIAGNOSIS — D518 Other vitamin B12 deficiency anemias: Secondary | ICD-10-CM

## 2021-05-15 MED ORDER — NA FERRIC GLUC CPLX IN SUCROSE 12.5 MG/ML IV SOLN
125.0000 mg | Freq: Once | INTRAVENOUS | Status: AC
  Administered 2021-05-15: 125 mg via INTRAVENOUS
  Filled 2021-05-15: qty 125

## 2021-05-15 MED ORDER — SODIUM CHLORIDE 0.9 % IV SOLN: Freq: Once | INTRAVENOUS | Status: AC

## 2021-05-15 NOTE — Patient Instructions (Signed)

## 2021-05-21 ENCOUNTER — Other Ambulatory Visit: Payer: Self-pay

## 2021-05-21 ENCOUNTER — Encounter: Payer: Self-pay | Admitting: Cardiology

## 2021-05-21 ENCOUNTER — Ambulatory Visit (INDEPENDENT_AMBULATORY_CARE_PROVIDER_SITE_OTHER): Payer: Medicare Other | Admitting: Cardiology

## 2021-05-21 VITALS — BP 126/64 | HR 77 | Ht 72.0 in | Wt 231.1 lb

## 2021-05-21 DIAGNOSIS — I480 Paroxysmal atrial fibrillation: Secondary | ICD-10-CM

## 2021-05-21 DIAGNOSIS — I5030 Unspecified diastolic (congestive) heart failure: Secondary | ICD-10-CM

## 2021-05-21 DIAGNOSIS — N184 Chronic kidney disease, stage 4 (severe): Secondary | ICD-10-CM | POA: Diagnosis not present

## 2021-05-21 DIAGNOSIS — Z7901 Long term (current) use of anticoagulants: Secondary | ICD-10-CM | POA: Diagnosis not present

## 2021-05-21 DIAGNOSIS — I350 Nonrheumatic aortic (valve) stenosis: Secondary | ICD-10-CM

## 2021-05-21 NOTE — Patient Instructions (Signed)

## 2021-05-21 NOTE — Progress Notes (Signed)
Cardiology Office Note:    Date:  05/21/2021   ID:  Eric [name], DOB 02/01/1941, MRN [id number]  PCP:  Eric Him, PA-C  Cardiologist:  Eric More, MD    Referring MD: Eric Him, PA-C    ASSESSMENT:    1. Paroxysmal atrial fibrillation ([name])   2. Chronic anticoagulation   3. Diastolic heart failure, unspecified HF chronicity ([place])   4. CKD (chronic kidney disease) stage 4, GFR 15-29 ml/min ([name])   5. Nonrheumatic aortic valve stenosis    PLAN:    In order of problems listed above:  As expected he is back in atrial fibrillation I think he is best described as persistent rather than paroxysmal rate is controlled without suppressant medications and is anticoagulated with Eliquis without bleeding.  I would not place [name] on an antiarrhythmic drug. His heart failure is compensated continue his current loop and distal diuretic Stable CKD class III Mild aortic stenosis consider repeat echocardiogram in a year   Next appointment: 6 months   Medication Adjustments/Labs and Tests Ordered: Current medicines are reviewed at length with the patient today.  Concerns regarding medicines are outlined above.  Orders Placed This Encounter  Procedures   EKG 12-Lead   No orders of the defined types were placed in this encounter.   Chief Complaint  Patient presents with   Follow-up   Congestive Heart Failure   Atrial Fibrillation    History of Present Illness:    Eric [name] is a 80 y.o. male with a hx of paroxysmal atrial fibrillation hypertensive heart disease with chronic diastolic heart failure and stage IV CKD myelodysplastic syndrome refractory sideroblastic anemia and mild aortic stenosis.  Last seen 11/20/2020.   Compliance with diet, lifestyle and medications: Yes he is receiving IV iron. Stools are returned to normal. He has no edema shortness of breath chest pain palpitation or syncope. As expected he is in atrial fibrillation today with controlled ventricular  rate His predominant complaint is itching and a very fine rash across the abdomen.  He was seen at [facility] ED 04/28/1999 2010 with concerns of GI bleeding.  Laboratory studies included creatinine of 1.61 hemoglobin 10.4 stool is Hemoccult negative.  GFR stable 34 cc/min  His echocardiogram 02/13/2020 showed normal left ventricular size wall thickness EF 60 to 65% with indeterminate filling pressures the right ventricle is normal in size left atrium severely enlarged right atrium moderately enlarged mild aortic stenosis with present with a mean gradient of 18 mm Past Medical History:  Diagnosis Date   Abnormal results of liver function studies 04/24/2014   Acquired hypothyroidism 04/24/2014   Acute on chronic diastolic heart failure ([place]) 02/12/2020   5/24-5/31/2021 acute on chronic heart failure in the context of chronic venous insufficiency and acute on CKD.  IV diuresis resulted in output of 6.9 L.   AKI (acute kidney injury) ([place]) 06/29/2019   Anemia    Atrial fibrillation ([place]) 01/15/2009   Qualifier: Diagnosis of  By: Lynnell Jude RN BSN, [employer] of this note might be different from the original. Overview:  Qualifier: Diagnosis of  By: Lynnell Jude RN BSN, [employer] of this note might be different from the original. 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 Overview:  EF 50-55%  Overview:  EF 50-55% Formatting of this note might be different from the original.  EF 50-55%   Chronic atrial fibrillation ([place])    Chronic coronary artery disease 04/24/2014   Formatting of this note might be different from the original. Overview:  Cath 2003 [place]. Moderate - LAD 60% Formatting of this note might be different from the original. Cath 2003 [facility] Moderate - LAD 60%   Chronic kidney disease, stage 3b ([place]) 06/28/2018   Colon polyp 07/08/2018   Current use of long term anticoagulation 10/12/2018   Degenerative arthritis of knee, bilateral 06/06/2014   Diabetes with neurologic complications ([place]) 65/11/5463   Diabetes, polyneuropathy ([place]) 68/08/7516   Diastolic heart failure ([place])    DIASTOLIC HEART FAILURE, CHRONIC 02/07/2009   Qualifier: Diagnosis of  By: Caryl Comes, MD, [facility], Mack Guise   Formatting of this note might be different from the original. Overview:  Overview:  Qualifier: Diagnosis of  By: Caryl Comes, MD, [facility], Mack Guise Formatting of this note might be different from the original. Overview:  Qualifier: Diagnosis of  By: Caryl Comes, MD, [name]   Disorder of kidney and ureter, unspecified 04/24/2014   Dry skin 08/23/2018   Elevated liver enzymes 07/09/2018   Gall bladder disease 2020   Gallstones 04/24/2014   Goals of care, counseling/discussion 05/29/2020   Gout 04/24/2014   Hearing loss 04/24/2014   Hip pain 06/06/2014   History of colon polyps 06/01/2018   History of right hip replacement    Pt stated 8-9 yrs ago   Hyperkalemia 07/13/2018   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   Influenza A 11/01/2017   Internal hemorrhoids 07/08/2018   Low grade myelodysplastic syndrome lesions ([place]) 05/29/2020   Nasal vestibulitis 04/24/2014   Neurocognitive deficits 02/20/2020   SLUMS is a mental status assessment of possible dementia published by the [place]. The test differentiates between high school or less education level in reference to presence or absence of dementia.  For high school education a score of 27-30 is normal, 21-26 is minimal neurocognitive deficit and 1-20 suggests dementia. With less than a high school education similar sco   Obesity 02/06/2009   Qualifier: Diagnosis of  By: Hollie Salk CMA, Monika Salk of this note might be different from the original. 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 Formatting of this note might be different from the original. Qualifier: Diagnosis of  By: Hollie Salk CMA, [place]:  Formatting of this note might be different from the original. 02/12/2020 A1c 8.4%  which is adequate control with age & advanced co-morbidities Glucos   Shingles    Skin lesion of left leg 03/21/2014   Tendinitis of right shoulder 07/18/2014   Thoracic or lumbosacral neuritis or radiculitis, unspecified 04/24/2014   Formatting of this note might be different from the original. Possible right L5 or S1   Thrombocytopenia ([place]) 02/20/2020   Platelet count varied from 73,000 up to 102,000 as inpatient.  Hematology outpatient consultation on 02/29/2020.   Formatting of this note might be different from the original. Formatting of this note might be different from the original. Platelet count varied from 73,000 up to 102,000 as inpatient.  Hematology outpatient consultation on 02/29/2020.   Last  Assessment & Plan:  Formatting of this note   Type 2 diabetes mellitus with stage 3b chronic kidney disease, with long-term current use of insulin ([place]) 02/25/2013   Venous insufficiency 02/19/2020   Formatting of this note might be different from the original. Last Assessment & Plan:  Formatting of this note might be different from the original. Unna boots will be continued at the SNF under the supervision of the wound care nurse.  Outpatient follow-up clinic in the vein clinic will be arranged. It is anticipated that the ABIs will be reattempted ;results were incomplete during hospitalizatio   Vitamin B12 deficiency 09/26/2014    Past Surgical History:  Procedure Laterality Date   APPENDECTOMY     CHOLECYSTECTOMY     TOTAL HIP ARTHROPLASTY      Current Medications: Current Meds  Medication Sig   acetaminophen (TYLENOL) 325 MG tablet Take 650 mg by mouth every 6 (six) hours as needed for moderate pain.    allopurinol (ZYLOPRIM) 100 MG tablet Take 100 mg by mouth daily.   ELIQUIS 5 MG TABS tablet Take 1 tablet (5 mg total) by mouth 2 (two) times daily.   insulin glargine, 1 Unit Dial, (TOUJEO SOLOSTAR) 300 UNIT/ML Solostar Pen Inject 46 Units into the skin daily. Sliding scale   insulin lispro (HUMALOG) 100 UNIT/ML KwikPen Inject 20 Units into the skin 3 (three) times daily.   levothyroxine (SYNTHROID) 100 MCG tablet Take 50-100 mcg by mouth See admin instructions. Takes 100 mg daily except Sundays, on Sunday takes 50 mg   OneTouch Delica Lancets 81K MISC 4 (four) times daily.   silodosin (RAPAFLO) 8 MG CAPS capsule Take 8 mg by mouth daily.    simvastatin (ZOCOR) 20 MG tablet TAKE 1 TABLET BY MOUTH AT  BEDTIME   spironolactone (ALDACTONE) 50 MG tablet TAKE 1 TABLET BY MOUTH  DAILY   torsemide (DEMADEX) 20 MG tablet Take 1 tablet (20 mg total) by mouth in the morning and at bedtime.     Allergies:   Iohexol, Morphine and related, Hydrocodone, Sulfa antibiotics, Sulfonamide derivatives,  Tramadol, Trazodone, and Trazodone and nefazodone   Social History   Socioeconomic History   Marital status: Married    Spouse name: Not on file   Number of children: Not on file   Years of education: Not on file   Highest education level: Not on file  Occupational History   Not on file  Tobacco Use   Smoking status: Never   Smokeless tobacco: Never  Vaping Use   Vaping Use: Never used  Substance and Sexual Activity   Alcohol use: Never   Drug use: Never   Sexual activity: Yes    Partners: Female  Other Topics Concern   Not on file  Social History Narrative   Not  on file   Social Determinants of Health   Financial Resource Strain: Not on file  Food Insecurity: Not on file  Transportation Needs: Not on file  Physical Activity: Not on file  Stress: Not on file  Social Connections: Not on file     Family History: The patient's family history includes Alzheimer's disease in his father; Congestive Heart Failure in his mother; Heart disease in his mother. ROS:   Please see the history of present illness.    All other systems reviewed and are negative.  EKGs/Labs/Other Studies Reviewed:    The following studies were reviewed today:  EKG:  EKG ordered today and personally reviewed.  The ekg ordered today demonstrates atrial fibrillation he has a controlled ventricular rate  Recent Labs: 08/29/2020: NT-Pro BNP 2,136 05/09/2021: ALT 9; BUN 48; Creatinine 1.97; Hemoglobin 9.9; Platelet Count 99; Potassium 4.3; Sodium 142  Recent Lipid Panel No results found for: CHOL, TRIG, HDL, CHOLHDL, VLDL, LDLCALC, LDLDIRECT  Physical Exam:    VS:  BP 126/64 (BP Location: Right Arm, Patient Position: Sitting, Cuff Size: Large)   Pulse 77   Ht 6' (1.829 m)   Wt 231 lb 1.9 oz (104.8 kg)   SpO2 98%   BMI 31.35 kg/m     Wt Readings from Last 3 Encounters:  05/21/21 231 lb 1.9 oz (104.8 kg)  05/09/21 236 lb (107 kg)  04/27/21 237 lb (107.5 kg)     GEN:  Well nourished, well  developed in no acute distress HEENT: Normal NECK: No JVD; No carotid bruits LYMPHATICS: No lymphadenopathy CARDIAC: Irregular rate and rhythm no murmurs, rubs, gallops RESPIRATORY:  Clear to auscultation without rales, wheezing or rhonchi  ABDOMEN: Soft, non-tender, non-distended MUSCULOSKELETAL:  No edema; No deformity  SKIN: Warm and dry NEUROLOGIC:  Alert and oriented x 3 PSYCHIATRIC:  Normal affect    Signed, Eric More, MD  05/21/2021 2:18 PM    [facility]

## 2021-06-06 ENCOUNTER — Ambulatory Visit: Payer: Medicare Other

## 2021-06-06 ENCOUNTER — Inpatient Hospital Stay: Payer: Medicare Other

## 2021-06-06 ENCOUNTER — Inpatient Hospital Stay: Payer: Medicare Other | Attending: Hematology & Oncology

## 2021-06-06 ENCOUNTER — Encounter: Payer: Self-pay | Admitting: Hematology & Oncology

## 2021-06-06 ENCOUNTER — Ambulatory Visit: Payer: Medicare Other | Admitting: Hematology & Oncology

## 2021-06-06 ENCOUNTER — Other Ambulatory Visit: Payer: Medicare Other

## 2021-06-06 ENCOUNTER — Other Ambulatory Visit: Payer: Self-pay

## 2021-06-06 ENCOUNTER — Telehealth: Payer: Self-pay

## 2021-06-06 ENCOUNTER — Inpatient Hospital Stay (HOSPITAL_BASED_OUTPATIENT_CLINIC_OR_DEPARTMENT_OTHER): Payer: Medicare Other | Admitting: Hematology & Oncology

## 2021-06-06 VITALS — BP 131/60 | HR 67 | Temp 97.5°F | Resp 20 | Wt 240.8 lb

## 2021-06-06 DIAGNOSIS — D4620 Refractory anemia with excess of blasts, unspecified: Secondary | ICD-10-CM

## 2021-06-06 DIAGNOSIS — D461 Refractory anemia with ring sideroblasts: Secondary | ICD-10-CM

## 2021-06-06 DIAGNOSIS — D518 Other vitamin B12 deficiency anemias: Secondary | ICD-10-CM

## 2021-06-06 DIAGNOSIS — Z79899 Other long term (current) drug therapy: Secondary | ICD-10-CM | POA: Diagnosis not present

## 2021-06-06 DIAGNOSIS — D509 Iron deficiency anemia, unspecified: Secondary | ICD-10-CM

## 2021-06-06 DIAGNOSIS — D469 Myelodysplastic syndrome, unspecified: Secondary | ICD-10-CM | POA: Diagnosis not present

## 2021-06-06 LAB — CBC WITH DIFFERENTIAL (CANCER CENTER ONLY)
Abs Immature Granulocytes: 0.02 K/uL (ref 0.00–0.07)
Basophils Absolute: 0 K/uL (ref 0.0–0.1)
Basophils Relative: 1 %
Eosinophils Absolute: 0.1 K/uL (ref 0.0–0.5)
Eosinophils Relative: 2 %
HCT: 31.8 % — ABNORMAL LOW (ref 39.0–52.0)
Hemoglobin: 10.9 g/dL — ABNORMAL LOW (ref 13.0–17.0)
Immature Granulocytes: 0 %
Lymphocytes Relative: 25 %
Lymphs Abs: 1.2 K/uL (ref 0.7–4.0)
MCH: 30.6 pg (ref 26.0–34.0)
MCHC: 34.3 g/dL (ref 30.0–36.0)
MCV: 89.3 fL (ref 80.0–100.0)
Monocytes Absolute: 0.4 K/uL (ref 0.1–1.0)
Monocytes Relative: 8 %
Neutro Abs: 3.1 K/uL (ref 1.7–7.7)
Neutrophils Relative %: 64 %
Platelet Count: 94 K/uL — ABNORMAL LOW (ref 150–400)
RBC: 3.56 MIL/uL — ABNORMAL LOW (ref 4.22–5.81)
RDW: 17.2 % — ABNORMAL HIGH (ref 11.5–15.5)
WBC Count: 4.9 K/uL (ref 4.0–10.5)
nRBC: 0 % (ref 0.0–0.2)

## 2021-06-06 LAB — CMP (CANCER CENTER ONLY)
ALT: 11 U/L (ref 0–44)
AST: 14 U/L — ABNORMAL LOW (ref 15–41)
Albumin: 4 g/dL (ref 3.5–5.0)
Alkaline Phosphatase: 89 U/L (ref 38–126)
Anion gap: 7 (ref 5–15)
BUN: 49 mg/dL — ABNORMAL HIGH (ref 8–23)
CO2: 26 mmol/L (ref 22–32)
Calcium: 9.3 mg/dL (ref 8.9–10.3)
Chloride: 106 mmol/L (ref 98–111)
Creatinine: 1.74 mg/dL — ABNORMAL HIGH (ref 0.61–1.24)
GFR, Estimated: 39 mL/min — ABNORMAL LOW (ref >60)
Glucose, Bld: 148 mg/dL — ABNORMAL HIGH (ref 70–99)
Potassium: 4.5 mmol/L (ref 3.5–5.1)
Sodium: 139 mmol/L (ref 135–145)
Total Bilirubin: 2 mg/dL — ABNORMAL HIGH (ref 0.3–1.2)
Total Protein: 6.4 g/dL — ABNORMAL LOW (ref 6.5–8.1)

## 2021-06-06 LAB — RETICULOCYTES
Immature Retic Fract: 18.5 % — ABNORMAL HIGH (ref 2.3–15.9)
RBC.: 3.54 MIL/uL — ABNORMAL LOW (ref 4.22–5.81)
Retic Count, Absolute: 220.2 10*3/uL — ABNORMAL HIGH (ref 19.0–186.0)
Retic Ct Pct: 6.2 % — ABNORMAL HIGH (ref 0.4–3.1)

## 2021-06-06 LAB — LACTATE DEHYDROGENASE: LDH: 230 U/L — ABNORMAL HIGH (ref 98–192)

## 2021-06-06 MED ORDER — LUSPATERCEPT-AAMT 75 MG ~~LOC~~ SOLR
100.0000 mg | Freq: Once | SUBCUTANEOUS | Status: AC
  Administered 2021-06-06: 100 mg via SUBCUTANEOUS
  Filled 2021-06-06: qty 1.5

## 2021-06-06 MED ORDER — EPOETIN ALFA-EPBX 40000 UNIT/ML IJ SOLN
40000.0000 [IU] | Freq: Once | INTRAMUSCULAR | Status: AC
  Administered 2021-06-06: 40000 [IU] via SUBCUTANEOUS

## 2021-06-06 NOTE — Progress Notes (Signed)
Hematology and Oncology Follow Up Visit  Eric [name] [id number] [birth year] 80 y.o. 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 Cuppett is here today for follow-up.  He is feeling pretty good.  He really has not had a lot of complaints.  When we last saw him in August, he did have a little bit low iron.  His iron saturation was only 17%.  We did going give him a dose of IV iron.  The big news is that he will be going out [place] for vacation.  He is not sure when he will be going.  It sounds like this might be October.  Sounds like he will be going to [place] and [place].  He has some friends out there that he will visit.  He has had no problems with bleeding.  He does have congestive heart failure.  He is on medication for this.  He has some chronic swelling in his legs.  He has had no problems with bowels or bladder.  He has had a good appetite.  He has had no nausea or vomiting.  Overall, I would say his performance status is ECOG 2.      Medications:  Allergies as of 06/06/2021       Reactions   Iohexol Other (See Comments)    Desc: "shuts kidneys down"   Morphine And Related Shortness Of Breath   Hydrocodone Nausea And Vomiting   Sulfa Antibiotics Other (See Comments)   UNKNOWN REACTION   Sulfonamide Derivatives Other (See Comments)   UNKNOWN REACTION   Tramadol Rash   Trazodone Rash, Other (See Comments)   Pain   Trazodone And Nefazodone Rash   Pain        Medication List        Accurate as of June 06, 2021  1:37 PM. If you have any questions, ask your nurse or doctor.          acetaminophen 325 MG tablet Commonly known as: TYLENOL Take 650 mg by mouth every 6 (six) hours as needed for moderate pain.   allopurinol 100 MG  tablet Commonly known as: ZYLOPRIM Take 100 mg by mouth daily.   Eliquis 5 MG Tabs tablet Generic drug: apixaban Take 1 tablet (5 mg total) by mouth 2 (two) times daily.   insulin lispro 100 UNIT/ML KwikPen Commonly known as: HUMALOG Inject 20 Units into the skin 3 (three) times daily.   levothyroxine 100 MCG tablet Commonly known as: SYNTHROID Take 50-100 mcg by mouth See admin instructions. Takes 100 mg daily except Sundays, on Sunday takes 50 mg   OneTouch Delica Lancets 27X Misc 4 (four) times daily.   silodosin 8 MG Caps capsule Commonly known as: RAPAFLO Take 8 mg by mouth daily.   simvastatin 20 MG tablet Commonly known as: ZOCOR TAKE 1 TABLET BY MOUTH AT  BEDTIME   spironolactone 50 MG tablet Commonly known as: ALDACTONE TAKE 1 TABLET BY MOUTH  DAILY   torsemide 20 MG tablet Commonly known as: DEMADEX Take 1 tablet (20 mg total) by mouth in the morning and at bedtime.   Toujeo SoloStar 300 UNIT/ML Solostar Pen Generic drug: insulin glargine (1 Unit Dial) Inject 46 Units into the skin daily. Sliding scale  Allergies:  Allergies  Allergen Reactions   Iohexol Other (See Comments)     Desc: "shuts kidneys down"    Morphine And Related Shortness Of Breath   Hydrocodone Nausea And Vomiting   Sulfa Antibiotics Other (See Comments)    UNKNOWN REACTION   Sulfonamide Derivatives Other (See Comments)    UNKNOWN REACTION   Tramadol Rash   Trazodone Rash and Other (See Comments)    Pain   Trazodone And Nefazodone Rash    Pain    Past Medical History, Surgical history, Social history, and Family History were reviewed and updated.  Review of Systems: Review of Systems  Constitutional:  Positive for malaise/fatigue.  HENT: Negative.    Eyes: Negative.   Respiratory:  Positive for shortness of breath.   Cardiovascular:  Positive for palpitations and leg swelling.  Gastrointestinal: Negative.   Genitourinary: Negative.   Musculoskeletal:  Positive  for joint pain and myalgias.  Skin:  Positive for rash.  Neurological:  Positive for focal weakness.  Endo/Heme/Allergies: Negative.   Psychiatric/Behavioral: Negative.      Physical Exam:  weight is 240 lb 12.8 oz (109.2 kg). His oral temperature is 97.5 F (36.4 C) (abnormal). His blood pressure is 131/60 and his pulse is 67. His respiration is 20 and oxygen saturation is 100%.   Wt Readings from Last 3 Encounters:  06/06/21 240 lb 12.8 oz (109.2 kg)  05/21/21 231 lb 1.9 oz (104.8 kg)  05/09/21 236 lb (107 kg)    Physical Exam Vitals reviewed.  HENT:     Head: Normocephalic and atraumatic.  Eyes:     Pupils: Pupils are equal, round, and reactive to light.  Cardiovascular:     Rate and Rhythm: Normal rate and regular rhythm.     Heart sounds: Normal heart sounds.  Pulmonary:     Effort: Pulmonary effort is normal.     Breath sounds: Normal breath sounds.  Abdominal:     General: Bowel sounds are normal.     Palpations: Abdomen is soft.  Musculoskeletal:        General: No tenderness or deformity. Normal range of motion.     Cervical back: Normal range of motion.  Lymphadenopathy:     Cervical: No cervical adenopathy.  Skin:    General: Skin is warm and dry.     Findings: No erythema or rash.  Neurological:     Mental Status: He is alert and oriented to person, place, and time.  Psychiatric:        Behavior: Behavior normal.        Thought Content: Thought content normal.        Judgment: Judgment normal.     Lab Results  Component Value Date   WBC 4.9 06/06/2021   HGB 10.9 (L) 06/06/2021   HCT 31.8 (L) 06/06/2021   MCV 89.3 06/06/2021   PLT 94 (L) 06/06/2021   Lab Results  Component Value Date   FERRITIN 53 05/09/2021   IRON 61 05/09/2021   TIBC 361 05/09/2021   UIBC 300 05/09/2021   IRONPCTSAT 17 (L) 05/09/2021   Lab Results  Component Value Date   RETICCTPCT 6.2 (H) 06/06/2021   RBC 3.56 (L) 06/06/2021   RBC 3.54 (L) 06/06/2021   Lab Results   Component Value Date   KPAFRELGTCHN 49.7 (H) 02/17/2020   LAMBDASER 40.1 (H) 02/17/2020   KAPLAMBRATIO 6.90 04/30/2020   Lab Results  Component Value Date   IGGSERUM 1,267 04/29/2020   IGA 231 04/29/2020  IGMSERUM 36 04/29/2020   Lab Results  Component Value Date   TOTALPROTELP 6.5 04/29/2020   ALBUMINELP 2.9 02/17/2020   A1GS 0.4 02/17/2020   A2GS 0.7 02/17/2020   BETS 0.8 02/17/2020   GAMS 0.7 02/17/2020   MSPIKE 0.3 (H) 02/17/2020   SPEI Comment 02/17/2020     Chemistry      Component Value Date/Time   NA 139 06/06/2021 1146   K 4.5 06/06/2021 1146   CL 106 06/06/2021 1146   CO2 26 06/06/2021 1146   BUN 49 (H) 06/06/2021 1146   CREATININE 1.74 (H) 06/06/2021 1146      Component Value Date/Time   CALCIUM 9.3 06/06/2021 1146   ALKPHOS 89 06/06/2021 1146   AST 14 (L) 06/06/2021 1146   ALT 11 06/06/2021 1146   BILITOT 2.0 (H) 06/06/2021 1146       Impression and Plan: Mr. Hamm is a very pleasant 80 yo caucasian gentleman with myelodysplasia -- refractory anemia with ringed sideroblast.   We will go with his Luspatercept again.  He will get Retacrit today.  Again, we will have to see what his iron studies look like.  He may need some IV iron.  I will have to get him back in a month.  I want to make sure that we get him back, hopefully before he goes on his trip.  I want to make sure that we keep his hematocrit up as much as possible.  Marland Kitchen    Volanda Napoleon, MD 9/16/20221:37 PM

## 2021-06-06 NOTE — Telephone Encounter (Signed)
Appts made and printed  for pt per 06/06/21 los  Eric [name] 

## 2021-06-09 LAB — IRON AND TIBC
Iron: 50 ug/dL (ref 42–163)
Saturation Ratios: 16 % — ABNORMAL LOW (ref 20–55)
TIBC: 310 ug/dL (ref 202–409)
UIBC: 260 ug/dL (ref 117–376)

## 2021-06-09 LAB — FERRITIN: Ferritin: 60 ng/mL (ref 24–336)

## 2021-06-16 ENCOUNTER — Telehealth: Payer: Self-pay | Admitting: Hematology & Oncology

## 2021-06-16 NOTE — Telephone Encounter (Signed)
Scheduled appt per 9/23 schedule message - patient is aware of appt date and time

## 2021-06-17 ENCOUNTER — Other Ambulatory Visit: Payer: Medicare Other

## 2021-06-17 ENCOUNTER — Ambulatory Visit: Payer: Medicare Other

## 2021-06-17 ENCOUNTER — Ambulatory Visit: Payer: Medicare Other | Admitting: Family

## 2021-06-19 ENCOUNTER — Telehealth: Payer: Self-pay | Admitting: *Deleted

## 2021-06-19 ENCOUNTER — Inpatient Hospital Stay: Payer: Medicare Other

## 2021-06-19 NOTE — Telephone Encounter (Signed)
Call received from patient stating that he is running a fever and will not be coming in today for his iron infusion.  Pt would like to reschedule on Weds, 07/03/21. Message sent to scheduling.

## 2021-07-03 ENCOUNTER — Inpatient Hospital Stay: Payer: Medicare Other | Attending: Hematology & Oncology

## 2021-07-03 ENCOUNTER — Other Ambulatory Visit: Payer: Self-pay

## 2021-07-03 VITALS — BP 150/63 | HR 61 | Temp 97.7°F | Resp 17

## 2021-07-03 DIAGNOSIS — Z79899 Other long term (current) drug therapy: Secondary | ICD-10-CM | POA: Insufficient documentation

## 2021-07-03 DIAGNOSIS — D469 Myelodysplastic syndrome, unspecified: Secondary | ICD-10-CM | POA: Insufficient documentation

## 2021-07-03 DIAGNOSIS — D518 Other vitamin B12 deficiency anemias: Secondary | ICD-10-CM

## 2021-07-03 MED ORDER — SODIUM CHLORIDE 0.9 % IV SOLN
Freq: Once | INTRAVENOUS | Status: AC
Start: 2021-07-03 — End: 2021-07-03

## 2021-07-03 MED ORDER — SODIUM CHLORIDE 0.9 % IV SOLN
125.0000 mg | Freq: Once | INTRAVENOUS | Status: AC
  Administered 2021-07-03: 125 mg via INTRAVENOUS
  Filled 2021-07-03: qty 125

## 2021-07-03 NOTE — Progress Notes (Signed)
Pt declined to stay for post infusion observation period. Pt stated he has tolerated medication multiple times prior without difficulty. Pt aware to call clinic with any questions or concerns. Pt verbalized understanding and had no further questions.   

## 2021-07-07 ENCOUNTER — Ambulatory Visit: Payer: Medicare Other

## 2021-07-07 ENCOUNTER — Ambulatory Visit: Payer: Medicare Other | Admitting: Hematology & Oncology

## 2021-07-07 ENCOUNTER — Other Ambulatory Visit: Payer: Medicare Other

## 2021-07-10 ENCOUNTER — Other Ambulatory Visit: Payer: Self-pay

## 2021-07-10 ENCOUNTER — Inpatient Hospital Stay: Payer: Medicare Other

## 2021-07-10 ENCOUNTER — Inpatient Hospital Stay (HOSPITAL_BASED_OUTPATIENT_CLINIC_OR_DEPARTMENT_OTHER): Payer: Medicare Other | Admitting: Hematology & Oncology

## 2021-07-10 ENCOUNTER — Encounter: Payer: Self-pay | Admitting: Hematology & Oncology

## 2021-07-10 VITALS — BP 164/70 | HR 65 | Temp 98.2°F | Resp 18 | Wt 239.0 lb

## 2021-07-10 DIAGNOSIS — D461 Refractory anemia with ring sideroblasts: Secondary | ICD-10-CM | POA: Diagnosis not present

## 2021-07-10 DIAGNOSIS — D4620 Refractory anemia with excess of blasts, unspecified: Secondary | ICD-10-CM

## 2021-07-10 DIAGNOSIS — D469 Myelodysplastic syndrome, unspecified: Secondary | ICD-10-CM | POA: Diagnosis not present

## 2021-07-10 LAB — CBC WITH DIFFERENTIAL (CANCER CENTER ONLY)
Abs Immature Granulocytes: 0.07 10*3/uL (ref 0.00–0.07)
Basophils Absolute: 0 10*3/uL (ref 0.0–0.1)
Basophils Relative: 1 %
Eosinophils Absolute: 0.1 10*3/uL (ref 0.0–0.5)
Eosinophils Relative: 1 %
HCT: 33.3 % — ABNORMAL LOW (ref 39.0–52.0)
Hemoglobin: 11.1 g/dL — ABNORMAL LOW (ref 13.0–17.0)
Immature Granulocytes: 1 %
Lymphocytes Relative: 23 %
Lymphs Abs: 1.2 10*3/uL (ref 0.7–4.0)
MCH: 29.4 pg (ref 26.0–34.0)
MCHC: 33.3 g/dL (ref 30.0–36.0)
MCV: 88.3 fL (ref 80.0–100.0)
Monocytes Absolute: 0.4 10*3/uL (ref 0.1–1.0)
Monocytes Relative: 7 %
Neutro Abs: 3.5 10*3/uL (ref 1.7–7.7)
Neutrophils Relative %: 67 %
Platelet Count: 109 10*3/uL — ABNORMAL LOW (ref 150–400)
RBC: 3.77 MIL/uL — ABNORMAL LOW (ref 4.22–5.81)
RDW: 15.9 % — ABNORMAL HIGH (ref 11.5–15.5)
WBC Count: 5.2 10*3/uL (ref 4.0–10.5)
nRBC: 0 % (ref 0.0–0.2)

## 2021-07-10 LAB — CMP (CANCER CENTER ONLY)
ALT: 15 U/L (ref 0–44)
AST: 16 U/L (ref 15–41)
Albumin: 4 g/dL (ref 3.5–5.0)
Alkaline Phosphatase: 85 U/L (ref 38–126)
Anion gap: 7 (ref 5–15)
BUN: 46 mg/dL — ABNORMAL HIGH (ref 8–23)
CO2: 26 mmol/L (ref 22–32)
Calcium: 9.1 mg/dL (ref 8.9–10.3)
Chloride: 108 mmol/L (ref 98–111)
Creatinine: 1.78 mg/dL — ABNORMAL HIGH (ref 0.61–1.24)
GFR, Estimated: 38 mL/min — ABNORMAL LOW (ref >60)
Glucose, Bld: 122 mg/dL — ABNORMAL HIGH (ref 70–99)
Potassium: 4.5 mmol/L (ref 3.5–5.1)
Sodium: 141 mmol/L (ref 135–145)
Total Bilirubin: 2 mg/dL — ABNORMAL HIGH (ref 0.3–1.2)
Total Protein: 6.7 g/dL (ref 6.5–8.1)

## 2021-07-10 LAB — RETICULOCYTES
Immature Retic Fract: 17.5 % — ABNORMAL HIGH (ref 2.3–15.9)
RBC.: 3.83 MIL/uL — ABNORMAL LOW (ref 4.22–5.81)
Retic Count, Absolute: 149 10*3/uL (ref 19.0–186.0)
Retic Ct Pct: 3.9 % — ABNORMAL HIGH (ref 0.4–3.1)

## 2021-07-10 MED ORDER — LUSPATERCEPT-AAMT 75 MG ~~LOC~~ SOLR
100.0000 mg | Freq: Once | SUBCUTANEOUS | Status: AC
  Administered 2021-07-10: 100 mg via SUBCUTANEOUS
  Filled 2021-07-10: qty 1.5

## 2021-07-10 NOTE — Patient Instructions (Signed)
Luspatercept injection What is this medication? LUSPATERCEPT (lus PAT er sept) helps your body make more red blood cells. This medicine is used to treat anemia caused by beta thalassemia or myelodysplastic syndromes. This medicine may be used for other purposes; ask your health care provider or pharmacist if you have questions. COMMON BRAND NAME(S): REBLOZYL What should I tell my care team before I take this medication? They need to know if you have any of these conditions: cigarette smoker have had your spleen removed high blood pressure history of blood clots an unusual or allergic reaction to luspatercept, other medicines, foods, dyes or preservatives pregnant or trying to get pregnant breast-feeding How should I use this medication? This medicine is for injection under the skin. It is given by a healthcare professional in a hospital or clinic setting. Talk to your pediatrician about the use of the medicine in children. This medicine is not approved for use in children. Overdosage: If you think you have taken too much of this medicine contact a poison control center or emergency room at once. NOTE: This medicine is only for you. Do not share this medicine with others. What if I miss a dose? Keep appointments for follow-up doses. It is important not to miss your dose. Call your doctor or healthcare professional if you are unable to keep an appointment. What may interact with this medication? Interactions are not expected. This list may not describe all possible interactions. Give your health care provider a list of all the medicines, herbs, non-prescription drugs, or dietary supplements you use. Also tell them if you smoke, drink alcohol, or use illegal drugs. Some items may interact with your medicine. What should I watch for while using this medication? Your condition will be monitored carefully while you are receiving this medicine. Do not become pregnant while taking this medicine or  for 3 months after stopping it. Women should inform their healthcare professional if they wish to become pregnant or think they might be pregnant. There is a potential for serious side effects and harm to an unborn child. Talk to your healthcare professional for more information. Do not breast-feed an infant while taking this medicine. You may need blood work done while you are taking this medicine. What side effects may I notice from receiving this medication? Side effects that you should report to your doctor or health care professional as soon as possible: allergic reactions like skin rash, itching or hives; swelling of the face, lips, or tongue signs and symptoms of a blood clot such as chest pain; shortness of breath; pain, swelling, or warmth in the leg signs and symptoms of a stroke like changes in vision; confusion; trouble speaking or understanding; severe headaches; sudden numbness or weakness of the face, arm or leg; trouble walking; dizziness; loss of balance or coordination Side effects that usually do not require medical attention (report these to your doctor or health care professional if they continue or are bothersome): cough diarrhea dizziness headache joint pain stomach pain tiredness This list may not describe all possible side effects. Call your doctor for medical advice about side effects. You may report side effects to FDA at 1-800-FDA-1088. Where should I keep my medication? This medicine is given in a hospital or clinic and will not be stored at home. NOTE: This sheet is a summary. It may not cover all possible information. If you have questions about this medicine, talk to your doctor, pharmacist, or health care provider.  2022 Elsevier/Gold Standard (2018-12-27 15:57:59)

## 2021-07-10 NOTE — Progress Notes (Signed)
Hematology and Oncology Follow Up Visit  Eric [name] [id number] 1941-05-17 80 y.o. 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  He is doing okay.  He does have some problems with his legs.  This sounds arthritic.Marland Kitchen  He has responded well to the [place] in [place].  He does not need any Retacrit today.  His hemoglobin is above 11.  His iron studies were low recently.  His iron saturation was only 16%.  We did going give him some Ferrlecit.  He has had no bleeding.  There is no change in bowel or bladder habits.  He has had some diarrhea.  Take some over-the-counter liquid for this.  This might be liquid Imodium.  Para he was out in [place] and [place].  This was a few weeks ago.  He had a nice time.  His appetite has been pretty good.  He is try to watch his carbs.  Overall, I would say his performance status is probably ECOG 1.    Medications:  Allergies as of 07/10/2021       Reactions   Iohexol Other (See Comments)    Desc: "shuts kidneys down"   Morphine And Related Shortness Of Breath   Hydrocodone Nausea And Vomiting   Sulfa Antibiotics Other (See Comments)   UNKNOWN REACTION   Sulfonamide Derivatives Other (See Comments)   UNKNOWN REACTION   Tramadol Rash   Trazodone Rash, Other (See Comments)   Pain   Trazodone And Nefazodone Rash   Pain        Medication List        Accurate as of July 10, 2021  3:59 PM. If you have any questions, ask your nurse or doctor.          acetaminophen 325 MG tablet Commonly known as: TYLENOL Take 650 mg by mouth every 6 (six) hours as needed for moderate pain.   allopurinol 100 MG tablet Commonly known as: ZYLOPRIM Take 100 mg by mouth daily.    Eliquis 5 MG Tabs tablet Generic drug: apixaban Take 1 tablet (5 mg total) by mouth 2 (two) times daily.   insulin lispro 100 UNIT/ML KwikPen Commonly known as: HUMALOG Inject 20 Units into the skin 3 (three) times daily.   levothyroxine 100 MCG tablet Commonly known as: SYNTHROID Take 50-100 mcg by mouth See admin instructions. Takes 100 mg daily except Sundays, on Sunday takes 50 mg   OneTouch Delica Lancets 54S Misc 4 (four) times daily.   silodosin 8 MG Caps capsule Commonly known as: RAPAFLO Take 8 mg by mouth daily.   simvastatin 20 MG tablet Commonly known as: ZOCOR TAKE 1 TABLET BY MOUTH AT  BEDTIME   spironolactone 50 MG tablet Commonly known as: ALDACTONE TAKE 1 TABLET BY MOUTH  DAILY   torsemide 20 MG tablet Commonly known as: DEMADEX Take 1 tablet (20 mg total) by mouth in the morning and at bedtime.   Toujeo SoloStar 300 UNIT/ML Solostar Pen Generic drug: insulin glargine (1 Unit Dial) Inject 46 Units into the skin daily. Sliding scale   Vitamin D (Ergocalciferol) 1.25 MG (50000 UNIT) Caps capsule Commonly known as: DRISDOL Take 50,000 Units by mouth once a week.  Allergies:  Allergies  Allergen Reactions   Iohexol Other (See Comments)     Desc: "shuts kidneys down"    Morphine And Related Shortness Of Breath   Hydrocodone Nausea And Vomiting   Sulfa Antibiotics Other (See Comments)    UNKNOWN REACTION   Sulfonamide Derivatives Other (See Comments)    UNKNOWN REACTION   Tramadol Rash   Trazodone Rash and Other (See Comments)    Pain   Trazodone And Nefazodone Rash    Pain    Past Medical History, Surgical history, Social history, and Family History were reviewed and updated.  Review of Systems: Review of Systems  Constitutional:  Positive for malaise/fatigue.  HENT: Negative.    Eyes: Negative.   Respiratory:  Positive for shortness of breath.   Cardiovascular:  Positive for palpitations and leg swelling.  Gastrointestinal:  Negative.   Genitourinary: Negative.   Musculoskeletal:  Positive for joint pain and myalgias.  Skin:  Positive for rash.  Neurological:  Positive for focal weakness.  Endo/Heme/Allergies: Negative.   Psychiatric/Behavioral: Negative.      Physical Exam:  weight is 239 lb (108.4 kg). His oral temperature is 98.2 F (36.8 C). His blood pressure is 164/70 (abnormal) and his pulse is 65. His respiration is 18 and oxygen saturation is 100%.   Wt Readings from Last 3 Encounters:  07/10/21 239 lb (108.4 kg)  06/06/21 240 lb 12.8 oz (109.2 kg)  05/21/21 231 lb 1.9 oz (104.8 kg)    Physical Exam Vitals reviewed.  HENT:     Head: Normocephalic and atraumatic.  Eyes:     Pupils: Pupils are equal, round, and reactive to light.  Cardiovascular:     Rate and Rhythm: Normal rate and regular rhythm.     Heart sounds: Normal heart sounds.  Pulmonary:     Effort: Pulmonary effort is normal.     Breath sounds: Normal breath sounds.  Abdominal:     General: Bowel sounds are normal.     Palpations: Abdomen is soft.  Musculoskeletal:        General: No tenderness or deformity. Normal range of motion.     Cervical back: Normal range of motion.  Lymphadenopathy:     Cervical: No cervical adenopathy.  Skin:    General: Skin is warm and dry.     Findings: No erythema or rash.  Neurological:     Mental Status: He is alert and oriented to person, place, and time.  Psychiatric:        Behavior: Behavior normal.        Thought Content: Thought content normal.        Judgment: Judgment normal.     Lab Results  Component Value Date   WBC 5.2 07/10/2021   HGB 11.1 (L) 07/10/2021   HCT 33.3 (L) 07/10/2021   MCV 88.3 07/10/2021   PLT 109 (L) 07/10/2021   Lab Results  Component Value Date   FERRITIN 60 06/06/2021   IRON 50 06/06/2021   TIBC 310 06/06/2021   UIBC 260 06/06/2021   IRONPCTSAT 16 (L) 06/06/2021   Lab Results  Component Value Date   RETICCTPCT 3.9 (H) 07/10/2021   RBC  3.77 (L) 07/10/2021   RBC 3.83 (L) 07/10/2021   Lab Results  Component Value Date   KPAFRELGTCHN 49.7 (H) 02/17/2020   LAMBDASER 40.1 (H) 02/17/2020   KAPLAMBRATIO 6.90 04/30/2020   Lab Results  Component Value Date   IGGSERUM 1,267 04/29/2020   IGA 231 04/29/2020  IGMSERUM 36 04/29/2020   Lab Results  Component Value Date   TOTALPROTELP 6.5 04/29/2020   ALBUMINELP 2.9 02/17/2020   A1GS 0.4 02/17/2020   A2GS 0.7 02/17/2020   BETS 0.8 02/17/2020   GAMS 0.7 02/17/2020   MSPIKE 0.3 (H) 02/17/2020   SPEI Comment 02/17/2020     Chemistry      Component Value Date/Time   NA 141 07/10/2021 1423   K 4.5 07/10/2021 1423   CL 108 07/10/2021 1423   CO2 26 07/10/2021 1423   BUN 46 (H) 07/10/2021 1423   CREATININE 1.78 (H) 07/10/2021 1423      Component Value Date/Time   CALCIUM 9.1 07/10/2021 1423   ALKPHOS 85 07/10/2021 1423   AST 16 07/10/2021 1423   ALT 15 07/10/2021 1423   BILITOT 2.0 (H) 07/10/2021 1423       Impression and Plan: Mr. Stamas is a very pleasant 80 yo caucasian gentleman with myelodysplasia -- refractory anemia with ringed sideroblast.   We will go with his Luspatercept again.    Again, we will have to see what his iron studies look like.  He may need some IV iron.  I will have to get him back in a month.  I want to make sure that we get him back, before the holidays.  Hopefully, he will be maintaining his hemoglobin.   Volanda Napoleon, MD 10/20/20223:59 PM

## 2021-07-11 LAB — FERRITIN: Ferritin: 63 ng/mL (ref 24–336)

## 2021-07-11 LAB — IRON AND TIBC
Iron: 49 ug/dL (ref 45–182)
Saturation Ratios: 15 % — ABNORMAL LOW (ref 17.9–39.5)
TIBC: 335 ug/dL (ref 250–450)
UIBC: 286 ug/dL

## 2021-07-14 ENCOUNTER — Telehealth: Payer: Self-pay | Admitting: *Deleted

## 2021-07-14 ENCOUNTER — Telehealth: Payer: Self-pay

## 2021-07-14 NOTE — Telephone Encounter (Signed)
Per staff mess message from [facility] - called and lvm to schedule (1) dose of IV Iron Venofer

## 2021-07-14 NOTE — Telephone Encounter (Signed)
-----   Message from Volanda Napoleon, MD sent at 07/12/2021  6:53 AM EDT ----- Please call and tell him that the iron is on the low side.  He needs a dose of IV iron.  Please set this up.  Thanks.  Laurey Arrow

## 2021-07-14 NOTE — Telephone Encounter (Signed)
Called and informed patient of lab results, patient verbalized understanding and denies any questions or concerns at this time. Pt previously had Venofer. Message sent to scheduling to call for apt.

## 2021-07-23 ENCOUNTER — Telehealth: Payer: Self-pay | Admitting: *Deleted

## 2021-07-23 NOTE — Telephone Encounter (Signed)
Per secure chat (1) dose of IV Iron - confirmed

## 2021-07-29 ENCOUNTER — Other Ambulatory Visit: Payer: Self-pay

## 2021-07-29 ENCOUNTER — Inpatient Hospital Stay: Payer: Medicare Other | Attending: Hematology & Oncology

## 2021-07-29 VITALS — BP 134/67 | HR 68 | Temp 97.8°F | Resp 16

## 2021-07-29 DIAGNOSIS — R0602 Shortness of breath: Secondary | ICD-10-CM | POA: Insufficient documentation

## 2021-07-29 DIAGNOSIS — M2550 Pain in unspecified joint: Secondary | ICD-10-CM | POA: Insufficient documentation

## 2021-07-29 DIAGNOSIS — D461 Refractory anemia with ring sideroblasts: Secondary | ICD-10-CM | POA: Insufficient documentation

## 2021-07-29 DIAGNOSIS — Z79899 Other long term (current) drug therapy: Secondary | ICD-10-CM | POA: Insufficient documentation

## 2021-07-29 DIAGNOSIS — R002 Palpitations: Secondary | ICD-10-CM | POA: Insufficient documentation

## 2021-07-29 DIAGNOSIS — M791 Myalgia, unspecified site: Secondary | ICD-10-CM | POA: Insufficient documentation

## 2021-07-29 DIAGNOSIS — R21 Rash and other nonspecific skin eruption: Secondary | ICD-10-CM | POA: Insufficient documentation

## 2021-07-29 DIAGNOSIS — R531 Weakness: Secondary | ICD-10-CM | POA: Diagnosis not present

## 2021-07-29 DIAGNOSIS — R5383 Other fatigue: Secondary | ICD-10-CM | POA: Diagnosis not present

## 2021-07-29 DIAGNOSIS — M7989 Other specified soft tissue disorders: Secondary | ICD-10-CM | POA: Insufficient documentation

## 2021-07-29 DIAGNOSIS — D518 Other vitamin B12 deficiency anemias: Secondary | ICD-10-CM

## 2021-07-29 MED ORDER — EPOETIN ALFA-EPBX 40000 UNIT/ML IJ SOLN: 40000.0000 [IU] | Freq: Once | INTRAMUSCULAR | Status: DC

## 2021-07-29 MED ORDER — SODIUM CHLORIDE 0.9 % IV SOLN: Freq: Once | INTRAVENOUS | Status: AC

## 2021-07-29 MED ORDER — SODIUM CHLORIDE 0.9 % IV SOLN
125.0000 mg | Freq: Once | INTRAVENOUS | Status: AC
  Administered 2021-07-29: 125 mg via INTRAVENOUS
  Filled 2021-07-29: qty 125

## 2021-07-29 NOTE — Patient Instructions (Signed)
Sodium Ferric Gluconate Complex Injection ?What is this medication? ?SODIUM FERRIC GLUCONATE COMPLEX (SOE dee um FER ik GLOO koe nate KOM pleks) treats low levels of iron (iron deficiency anemia) in people with kidney disease. Iron is a mineral that plays an important role in making red blood cells, which carry oxygen from your lungs to the rest of your body. ?This medicine may be used for other purposes; ask your health care provider or pharmacist if you have questions. ?COMMON BRAND NAME(S): Ferrlecit, Nulecit ?What should I tell my care team before I take this medication? ?They need to know if you have any of the following conditions: ?Anemia that is not from iron deficiency ?High levels of iron in the blood ?An unusual or allergic reaction to iron, other medications, foods, dyes, or preservatives ?Pregnant or are trying to become pregnant ?Breast-feeding ?How should I use this medication? ?This medication is injected into a vein. It is given by your care team in a hospital or clinic setting. ?Talk to your care team about the use of this medication in children. While it may be prescribed for children as young as 6 years for selected conditions, precautions do apply. ?Overdosage: If you think you have taken too much of this medicine contact a poison control center or emergency room at once. ?NOTE: This medicine is only for you. Do not share this medicine with others. ?What if I miss a dose? ?It is important not to miss your dose. Call your care team if you are unable to keep an appointment. ?What may interact with this medication? ?Do not take this medication with any of the following: ?Deferasirox ?Deferoxamine ?Dimercaprol ?This medication may also interact with the following: ?Other iron products ?This list may not describe all possible interactions. Give your health care provider a list of all the medicines, herbs, non-prescription drugs, or dietary supplements you use. Also tell them if you smoke, drink  alcohol, or use illegal drugs. Some items may interact with your medicine. ?What should I watch for while using this medication? ?Your condition will be monitored carefully while you are receiving this medication. ?Visit your care team for regular checks on your progress. You may need blood work while you are taking this medication. ?What side effects may I notice from receiving this medication? ?Side effects that you should report to your care team as soon as possible: ?Allergic reactions--skin rash, itching, hives, swelling of the face, lips, tongue, or throat ?Low blood pressure--dizziness, feeling faint or lightheaded, blurry vision ?Shortness of breath ?Side effects that usually do not require medical attention (report to your care team if they continue or are bothersome): ?Flushing ?Headache ?Joint pain ?Muscle pain ?Nausea ?Pain, redness, or irritation at injection site ?This list may not describe all possible side effects. Call your doctor for medical advice about side effects. You may report side effects to FDA at 1-800-FDA-1088. ?Where should I keep my medication? ?This medication is given in a hospital or clinic and will not be stored at home. ?NOTE: This sheet is a summary. It may not cover all possible information. If you have questions about this medicine, talk to your doctor, pharmacist, or health care provider. ?? 2022 Elsevier/Gold Standard (2021-01-31 00:00:00) ? ?

## 2021-08-08 ENCOUNTER — Inpatient Hospital Stay (HOSPITAL_BASED_OUTPATIENT_CLINIC_OR_DEPARTMENT_OTHER): Payer: Medicare Other | Admitting: Hematology & Oncology

## 2021-08-08 ENCOUNTER — Inpatient Hospital Stay: Payer: Medicare Other

## 2021-08-08 ENCOUNTER — Encounter: Payer: Self-pay | Admitting: Hematology & Oncology

## 2021-08-08 ENCOUNTER — Other Ambulatory Visit: Payer: Self-pay

## 2021-08-08 VITALS — BP 133/56 | HR 61 | Temp 97.7°F | Resp 20 | Wt 241.8 lb

## 2021-08-08 DIAGNOSIS — D461 Refractory anemia with ring sideroblasts: Secondary | ICD-10-CM | POA: Diagnosis not present

## 2021-08-08 DIAGNOSIS — D4620 Refractory anemia with excess of blasts, unspecified: Secondary | ICD-10-CM

## 2021-08-08 LAB — CBC WITH DIFFERENTIAL (CANCER CENTER ONLY)
Abs Immature Granulocytes: 0.01 10*3/uL (ref 0.00–0.07)
Basophils Absolute: 0 10*3/uL (ref 0.0–0.1)
Basophils Relative: 1 %
Eosinophils Absolute: 0.1 10*3/uL (ref 0.0–0.5)
Eosinophils Relative: 2 %
HCT: 34 % — ABNORMAL LOW (ref 39.0–52.0)
Hemoglobin: 11.5 g/dL — ABNORMAL LOW (ref 13.0–17.0)
Immature Granulocytes: 0 %
Lymphocytes Relative: 25 %
Lymphs Abs: 1 10*3/uL (ref 0.7–4.0)
MCH: 29.8 pg (ref 26.0–34.0)
MCHC: 33.8 g/dL (ref 30.0–36.0)
MCV: 88.1 fL (ref 80.0–100.0)
Monocytes Absolute: 0.3 10*3/uL (ref 0.1–1.0)
Monocytes Relative: 7 %
Neutro Abs: 2.6 10*3/uL (ref 1.7–7.7)
Neutrophils Relative %: 65 %
Platelet Count: 92 10*3/uL — ABNORMAL LOW (ref 150–400)
RBC: 3.86 MIL/uL — ABNORMAL LOW (ref 4.22–5.81)
RDW: 15.8 % — ABNORMAL HIGH (ref 11.5–15.5)
WBC Count: 4 10*3/uL (ref 4.0–10.5)
nRBC: 0 % (ref 0.0–0.2)

## 2021-08-08 LAB — RETICULOCYTES
Immature Retic Fract: 12.5 % (ref 2.3–15.9)
RBC.: 3.86 MIL/uL — ABNORMAL LOW (ref 4.22–5.81)
Retic Count, Absolute: 168.7 10*3/uL (ref 19.0–186.0)
Retic Ct Pct: 4.4 % — ABNORMAL HIGH (ref 0.4–3.1)

## 2021-08-08 LAB — CMP (CANCER CENTER ONLY)
ALT: 14 U/L (ref 0–44)
AST: 16 U/L (ref 15–41)
Albumin: 4 g/dL (ref 3.5–5.0)
Alkaline Phosphatase: 86 U/L (ref 38–126)
Anion gap: 7 (ref 5–15)
BUN: 52 mg/dL — ABNORMAL HIGH (ref 8–23)
CO2: 28 mmol/L (ref 22–32)
Calcium: 9.5 mg/dL (ref 8.9–10.3)
Chloride: 107 mmol/L (ref 98–111)
Creatinine: 2.02 mg/dL — ABNORMAL HIGH (ref 0.61–1.24)
GFR, Estimated: 33 mL/min — ABNORMAL LOW (ref >60)
Glucose, Bld: 161 mg/dL — ABNORMAL HIGH (ref 70–99)
Potassium: 4.7 mmol/L (ref 3.5–5.1)
Sodium: 142 mmol/L (ref 135–145)
Total Bilirubin: 1.9 mg/dL — ABNORMAL HIGH (ref 0.3–1.2)
Total Protein: 6.3 g/dL — ABNORMAL LOW (ref 6.5–8.1)

## 2021-08-08 NOTE — Progress Notes (Signed)
Hematology and Oncology Follow Up Visit  Eric [name] [id number] 10-16-1940 80 y.o. 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  He is doing okay.  He does have some problems with his legs.  This sounds arthritic.Marland Kitchen  He has responded well to the [place] in [place].  He does not need any Retacrit today.  His hemoglobin is above 11.5.  I think I will hold his Retacrit.  His iron studies back in October showed a ferritin of 63 with an iron saturation of 15%.  I think we did give a dose of IV iron.    He has had no bleeding.  There is no change in bowel or bladder habits.    His appetite has been pretty good.  He is try to watch his carbs.  Overall, I would say his performance status is probably ECOG 1.    Medications:  Allergies as of 08/08/2021       Reactions   Iohexol Other (See Comments)    Desc: "shuts kidneys down"   Morphine And Related Shortness Of Breath   Hydrocodone Nausea And Vomiting   Sulfa Antibiotics Other (See Comments)   UNKNOWN REACTION   Sulfonamide Derivatives Other (See Comments)   UNKNOWN REACTION   Tramadol Rash   Trazodone Rash, Other (See Comments)   Pain   Trazodone And Nefazodone Rash   Pain        Medication List        Accurate as of August 08, 2021  2:00 PM. If you have any questions, ask your nurse or doctor.          acetaminophen 325 MG tablet Commonly known as: TYLENOL Take 650 mg by mouth every 6 (six) hours as needed for moderate pain.   allopurinol 100 MG tablet Commonly known as: ZYLOPRIM Take 100 mg by mouth daily.   Eliquis 5 MG Tabs tablet Generic drug: apixaban Take 1 tablet (5 mg total) by mouth 2 (two) times daily.   insulin lispro  100 UNIT/ML KwikPen Commonly known as: HUMALOG Inject 20 Units into the skin 3 (three) times daily.   levothyroxine 100 MCG tablet Commonly known as: SYNTHROID Take 50-100 mcg by mouth See admin instructions. Takes 100 mg daily except Sundays, on Sunday takes 50 mg   OneTouch Delica Lancets 49E Misc 4 (four) times daily.   silodosin 8 MG Caps capsule Commonly known as: RAPAFLO Take 8 mg by mouth daily.   simvastatin 20 MG tablet Commonly known as: ZOCOR TAKE 1 TABLET BY MOUTH AT  BEDTIME   spironolactone 50 MG tablet Commonly known as: ALDACTONE TAKE 1 TABLET BY MOUTH  DAILY   torsemide 20 MG tablet Commonly known as: DEMADEX Take 1 tablet (20 mg total) by mouth in the morning and at bedtime.   Toujeo SoloStar 300 UNIT/ML Solostar Pen Generic drug: insulin glargine (1 Unit Dial) Inject 46 Units into the skin daily. Sliding scale   Vitamin D (Ergocalciferol) 1.25 MG (50000 UNIT) Caps capsule Commonly known as: DRISDOL Take 50,000 Units by mouth once a week.        Allergies:  Allergies  Allergen Reactions   Iohexol Other (See Comments)     Desc: "  shuts kidneys down"    Morphine And Related Shortness Of Breath   Hydrocodone Nausea And Vomiting   Sulfa Antibiotics Other (See Comments)    UNKNOWN REACTION   Sulfonamide Derivatives Other (See Comments)    UNKNOWN REACTION   Tramadol Rash   Trazodone Rash and Other (See Comments)    Pain   Trazodone And Nefazodone Rash    Pain    Past Medical History, Surgical history, Social history, and Family History were reviewed and updated.  Review of Systems: Review of Systems  Constitutional:  Positive for malaise/fatigue.  HENT: Negative.    Eyes: Negative.   Respiratory:  Positive for shortness of breath.   Cardiovascular:  Positive for palpitations and leg swelling.  Gastrointestinal: Negative.   Genitourinary: Negative.   Musculoskeletal:  Positive for joint pain and myalgias.  Skin:  Positive for rash.   Neurological:  Positive for focal weakness.  Endo/Heme/Allergies: Negative.   Psychiatric/Behavioral: Negative.      Physical Exam:  weight is 241 lb 12.8 oz (109.7 kg). His oral temperature is 97.7 F (36.5 C). His blood pressure is 133/56 (abnormal) and his pulse is 61. His respiration is 20 and oxygen saturation is 100%.   Wt Readings from Last 3 Encounters:  08/08/21 241 lb 12.8 oz (109.7 kg)  07/10/21 239 lb (108.4 kg)  06/06/21 240 lb 12.8 oz (109.2 kg)    Physical Exam Vitals reviewed.  HENT:     Head: Normocephalic and atraumatic.  Eyes:     Pupils: Pupils are equal, round, and reactive to light.  Cardiovascular:     Rate and Rhythm: Normal rate and regular rhythm.     Heart sounds: Normal heart sounds.  Pulmonary:     Effort: Pulmonary effort is normal.     Breath sounds: Normal breath sounds.  Abdominal:     General: Bowel sounds are normal.     Palpations: Abdomen is soft.  Musculoskeletal:        General: No tenderness or deformity. Normal range of motion.     Cervical back: Normal range of motion.  Lymphadenopathy:     Cervical: No cervical adenopathy.  Skin:    General: Skin is warm and dry.     Findings: No erythema or rash.  Neurological:     Mental Status: He is alert and oriented to person, place, and time.  Psychiatric:        Behavior: Behavior normal.        Thought Content: Thought content normal.        Judgment: Judgment normal.     Lab Results  Component Value Date   WBC 4.0 08/08/2021   HGB 11.5 (L) 08/08/2021   HCT 34.0 (L) 08/08/2021   MCV 88.1 08/08/2021   PLT 92 (L) 08/08/2021   Lab Results  Component Value Date   FERRITIN 63 07/10/2021   IRON 49 07/10/2021   TIBC 335 07/10/2021   UIBC 286 07/10/2021   IRONPCTSAT 15 (L) 07/10/2021   Lab Results  Component Value Date   RETICCTPCT 4.4 (H) 08/08/2021   RBC 3.86 (L) 08/08/2021   RBC 3.86 (L) 08/08/2021   Lab Results  Component Value Date   KPAFRELGTCHN 49.7 (H)  02/17/2020   LAMBDASER 40.1 (H) 02/17/2020   KAPLAMBRATIO 6.90 04/30/2020   Lab Results  Component Value Date   IGGSERUM 1,267 04/29/2020   IGA 231 04/29/2020   IGMSERUM 36 04/29/2020   Lab Results  Component Value Date   TOTALPROTELP 6.5  04/29/2020   ALBUMINELP 2.9 02/17/2020   A1GS 0.4 02/17/2020   A2GS 0.7 02/17/2020   BETS 0.8 02/17/2020   GAMS 0.7 02/17/2020   MSPIKE 0.3 (H) 02/17/2020   SPEI Comment 02/17/2020     Chemistry      Component Value Date/Time   NA 142 08/08/2021 1250   K 4.7 08/08/2021 1250   CL 107 08/08/2021 1250   CO2 28 08/08/2021 1250   BUN 52 (H) 08/08/2021 1250   CREATININE 2.02 (H) 08/08/2021 1250      Component Value Date/Time   CALCIUM 9.5 08/08/2021 1250   ALKPHOS 86 08/08/2021 1250   AST 16 08/08/2021 1250   ALT 14 08/08/2021 1250   BILITOT 1.9 (H) 08/08/2021 1250       Impression and Plan: Mr. Ashurst is a very pleasant 80 yo caucasian gentleman with myelodysplasia -- refractory anemia with ringed sideroblast.   Again, I will get a hold Luspatercept today.  I will remove his Luspatercept out every 4 weeks now.  I think that as long as his hemoglobin is above 11.5, we can probably hold on giving him Luspatercept.    Volanda Napoleon, MD 11/18/20222:00 PM

## 2021-08-11 ENCOUNTER — Encounter: Payer: Self-pay | Admitting: *Deleted

## 2021-08-11 LAB — IRON AND TIBC
Iron: 53 ug/dL (ref 42–163)
Saturation Ratios: 18 % — ABNORMAL LOW (ref 20–55)
TIBC: 298 ug/dL (ref 202–409)
UIBC: 245 ug/dL (ref 117–376)

## 2021-08-11 LAB — FERRITIN: Ferritin: 90 ng/mL (ref 24–336)

## 2021-08-20 ENCOUNTER — Other Ambulatory Visit: Payer: Self-pay

## 2021-08-20 ENCOUNTER — Inpatient Hospital Stay: Payer: Medicare Other

## 2021-08-20 VITALS — BP 158/79 | HR 67 | Temp 97.0°F | Resp 18

## 2021-08-20 DIAGNOSIS — D461 Refractory anemia with ring sideroblasts: Secondary | ICD-10-CM | POA: Diagnosis not present

## 2021-08-20 DIAGNOSIS — D518 Other vitamin B12 deficiency anemias: Secondary | ICD-10-CM

## 2021-08-20 MED ORDER — SODIUM CHLORIDE 0.9 % IV SOLN
125.0000 mg | Freq: Once | INTRAVENOUS | Status: AC
  Administered 2021-08-20: 125 mg via INTRAVENOUS
  Filled 2021-08-20: qty 125

## 2021-08-20 MED ORDER — SODIUM CHLORIDE 0.9 % IV SOLN: Freq: Once | INTRAVENOUS | Status: AC

## 2021-08-20 NOTE — Patient Instructions (Signed)
Sodium Ferric Gluconate Complex Injection ?What is this medication? ?SODIUM FERRIC GLUCONATE COMPLEX (SOE dee um FER ik GLOO koe nate KOM pleks) treats low levels of iron (iron deficiency anemia) in people with kidney disease. Iron is a mineral that plays an important role in making red blood cells, which carry oxygen from your lungs to the rest of your body. ?This medicine may be used for other purposes; ask your health care provider or pharmacist if you have questions. ?COMMON BRAND NAME(S): Ferrlecit, Nulecit ?What should I tell my care team before I take this medication? ?They need to know if you have any of the following conditions: ?Anemia that is not from iron deficiency ?High levels of iron in the blood ?An unusual or allergic reaction to iron, other medications, foods, dyes, or preservatives ?Pregnant or are trying to become pregnant ?Breast-feeding ?How should I use this medication? ?This medication is injected into a vein. It is given by your care team in a hospital or clinic setting. ?Talk to your care team about the use of this medication in children. While it may be prescribed for children as young as 6 years for selected conditions, precautions do apply. ?Overdosage: If you think you have taken too much of this medicine contact a poison control center or emergency room at once. ?NOTE: This medicine is only for you. Do not share this medicine with others. ?What if I miss a dose? ?It is important not to miss your dose. Call your care team if you are unable to keep an appointment. ?What may interact with this medication? ?Do not take this medication with any of the following: ?Deferasirox ?Deferoxamine ?Dimercaprol ?This medication may also interact with the following: ?Other iron products ?This list may not describe all possible interactions. Give your health care provider a list of all the medicines, herbs, non-prescription drugs, or dietary supplements you use. Also tell them if you smoke, drink  alcohol, or use illegal drugs. Some items may interact with your medicine. ?What should I watch for while using this medication? ?Your condition will be monitored carefully while you are receiving this medication. ?Visit your care team for regular checks on your progress. You may need blood work while you are taking this medication. ?What side effects may I notice from receiving this medication? ?Side effects that you should report to your care team as soon as possible: ?Allergic reactions--skin rash, itching, hives, swelling of the face, lips, tongue, or throat ?Low blood pressure--dizziness, feeling faint or lightheaded, blurry vision ?Shortness of breath ?Side effects that usually do not require medical attention (report to your care team if they continue or are bothersome): ?Flushing ?Headache ?Joint pain ?Muscle pain ?Nausea ?Pain, redness, or irritation at injection site ?This list may not describe all possible side effects. Call your doctor for medical advice about side effects. You may report side effects to FDA at 1-800-FDA-1088. ?Where should I keep my medication? ?This medication is given in a hospital or clinic and will not be stored at home. ?NOTE: This sheet is a summary. It may not cover all possible information. If you have questions about this medicine, talk to your doctor, pharmacist, or health care provider. ?? 2022 Elsevier/Gold Standard (2021-01-31 00:00:00) ? ?

## 2021-09-01 ENCOUNTER — Other Ambulatory Visit: Payer: Self-pay

## 2021-09-01 ENCOUNTER — Inpatient Hospital Stay: Payer: Medicare Other | Attending: Hematology & Oncology

## 2021-09-01 ENCOUNTER — Inpatient Hospital Stay (HOSPITAL_BASED_OUTPATIENT_CLINIC_OR_DEPARTMENT_OTHER): Payer: Medicare Other | Admitting: Family

## 2021-09-01 ENCOUNTER — Encounter: Payer: Self-pay | Admitting: Family

## 2021-09-01 ENCOUNTER — Inpatient Hospital Stay: Payer: Medicare Other

## 2021-09-01 VITALS — BP 137/61 | HR 73 | Temp 97.7°F | Ht 72.0 in | Wt 241.8 lb

## 2021-09-01 DIAGNOSIS — Z79899 Other long term (current) drug therapy: Secondary | ICD-10-CM | POA: Diagnosis not present

## 2021-09-01 DIAGNOSIS — D4620 Refractory anemia with excess of blasts, unspecified: Secondary | ICD-10-CM

## 2021-09-01 DIAGNOSIS — D461 Refractory anemia with ring sideroblasts: Secondary | ICD-10-CM | POA: Diagnosis present

## 2021-09-01 DIAGNOSIS — D509 Iron deficiency anemia, unspecified: Secondary | ICD-10-CM | POA: Diagnosis not present

## 2021-09-01 DIAGNOSIS — D518 Other vitamin B12 deficiency anemias: Secondary | ICD-10-CM

## 2021-09-01 LAB — CBC WITH DIFFERENTIAL (CANCER CENTER ONLY)
Abs Immature Granulocytes: 0.02 10*3/uL (ref 0.00–0.07)
Basophils Absolute: 0 10*3/uL (ref 0.0–0.1)
Basophils Relative: 1 %
Eosinophils Absolute: 0.1 10*3/uL (ref 0.0–0.5)
Eosinophils Relative: 2 %
HCT: 29.9 % — ABNORMAL LOW (ref 39.0–52.0)
Hemoglobin: 10.1 g/dL — ABNORMAL LOW (ref 13.0–17.0)
Immature Granulocytes: 1 %
Lymphocytes Relative: 27 %
Lymphs Abs: 1.1 10*3/uL (ref 0.7–4.0)
MCH: 29.9 pg (ref 26.0–34.0)
MCHC: 33.8 g/dL (ref 30.0–36.0)
MCV: 88.5 fL (ref 80.0–100.0)
Monocytes Absolute: 0.5 10*3/uL (ref 0.1–1.0)
Monocytes Relative: 12 %
Neutro Abs: 2.5 10*3/uL (ref 1.7–7.7)
Neutrophils Relative %: 57 %
Platelet Count: 86 10*3/uL — ABNORMAL LOW (ref 150–400)
RBC: 3.38 MIL/uL — ABNORMAL LOW (ref 4.22–5.81)
RDW: 15.5 % (ref 11.5–15.5)
WBC Count: 4.2 10*3/uL (ref 4.0–10.5)
nRBC: 0 % (ref 0.0–0.2)

## 2021-09-01 LAB — CMP (CANCER CENTER ONLY)
ALT: 9 U/L (ref 0–44)
AST: 13 U/L — ABNORMAL LOW (ref 15–41)
Albumin: 4 g/dL (ref 3.5–5.0)
Alkaline Phosphatase: 88 U/L (ref 38–126)
Anion gap: 7 (ref 5–15)
BUN: 39 mg/dL — ABNORMAL HIGH (ref 8–23)
CO2: 30 mmol/L (ref 22–32)
Calcium: 9.4 mg/dL (ref 8.9–10.3)
Chloride: 105 mmol/L (ref 98–111)
Creatinine: 1.9 mg/dL — ABNORMAL HIGH (ref 0.61–1.24)
GFR, Estimated: 35 mL/min — ABNORMAL LOW (ref >60)
Glucose, Bld: 132 mg/dL — ABNORMAL HIGH (ref 70–99)
Potassium: 4.4 mmol/L (ref 3.5–5.1)
Sodium: 142 mmol/L (ref 135–145)
Total Bilirubin: 3.1 mg/dL — ABNORMAL HIGH (ref 0.3–1.2)
Total Protein: 6.2 g/dL — ABNORMAL LOW (ref 6.5–8.1)

## 2021-09-01 LAB — RETICULOCYTES
Immature Retic Fract: 9.6 % (ref 2.3–15.9)
RBC.: 3.39 MIL/uL — ABNORMAL LOW (ref 4.22–5.81)
Retic Count, Absolute: 112.2 10*3/uL (ref 19.0–186.0)
Retic Ct Pct: 3.3 % — ABNORMAL HIGH (ref 0.4–3.1)

## 2021-09-01 MED ORDER — EPOETIN ALFA-EPBX 40000 UNIT/ML IJ SOLN
40000.0000 [IU] | Freq: Once | INTRAMUSCULAR | Status: AC
  Administered 2021-09-01: 40000 [IU] via SUBCUTANEOUS
  Filled 2021-09-01: qty 1

## 2021-09-01 MED ORDER — LUSPATERCEPT-AAMT 75 MG ~~LOC~~ SOLR
100.0000 mg | Freq: Once | SUBCUTANEOUS | Status: AC
  Administered 2021-09-01: 100 mg via SUBCUTANEOUS
  Filled 2021-09-01: qty 1.5

## 2021-09-01 NOTE — Patient Instructions (Signed)
Luspatercept Injection What is this medication? LUSPATERCEPT (lus PAT er sept) treats low levels of red blood cells (anemia) in the body in people with beta thalassemia or myelodysplastic syndromes. It works by helping the body make more red blood cells. This medicine may be used for other purposes; ask your health care provider or pharmacist if you have questions. COMMON BRAND NAME(S): REBLOZYL What should I tell my care team before I take this medication? They need to know if you have any of these conditions: Have had your spleen removed High blood pressure History of blood clots Tobacco use An unusual or allergic reaction to luspatercept, other medications, foods, dyes or preservatives Pregnant or trying to get pregnant Breast-feeding How should I use this medication? This medication is for injection under the skin. It is given by your care team in a hospital or clinic setting. Talk to your care team about the use of the medication in children. This medication is not approved for use in children. Overdosage: If you think you have taken too much of this medicine contact a poison control center or emergency room at once. NOTE: This medicine is only for you. Do not share this medicine with others. What if I miss a dose? Keep appointments for follow-up doses. It is important not to miss your dose. Call your care team if you are unable to keep an appointment. What may interact with this medication? Interactions are not expected. This list may not describe all possible interactions. Give your health care provider a list of all the medicines, herbs, non-prescription drugs, or dietary supplements you use. Also tell them if you smoke, drink alcohol, or use illegal drugs. Some items may interact with your medicine. What should I watch for while using this medication? Your condition will be monitored carefully while you are receiving this medication. Talk to your care team if you wish to become  pregnant or think you might be pregnant. This medication can cause serious birth defects. Discuss contraceptive options with your care team. Do not breastfeed while taking this medication. You may need blood work done while you are taking this medication. What side effects may I notice from receiving this medication? Side effects that you should report to your care team as soon as possible: Allergic reactions--skin rash, itching, hives, swelling of the face, lips, tongue, or throat Blood clot--pain, swelling, or warmth in the leg, shortness of breath, chest pain Increase in blood pressure Severe back pain, numbness or weakness of the hands, arms, legs, or feet, loss of coordination, loss of bowel or bladder control Side effects that usually do not require medical attention (report these to your care team if they continue or are bothersome): Bone pain Dizziness Fatigue Headache Joint pain Muscle pain Stomach pain This list may not describe all possible side effects. Call your doctor for medical advice about side effects. You may report side effects to FDA at 1-800-FDA-1088. Where should I keep my medication? This medication is given in a hospital or clinic. It will not be stored at home. NOTE: This sheet is a summary. It may not cover all possible information. If you have questions about this medicine, talk to your doctor, pharmacist, or health care provider.  2022 Elsevier/Gold Standard (2021-04-04 00:00:00) Epoetin Alfa injection What is this medication? EPOETIN ALFA (e POE e tin AL fa) helps your body make more red blood cells. This medicine is used to treat anemia caused by chronic kidney disease, cancer chemotherapy, or HIV-therapy. It may also be  used before surgery if you have anemia. This medicine may be used for other purposes; ask your health care provider or pharmacist if you have questions. COMMON BRAND NAME(S): Epogen, Procrit, Retacrit What should I tell my care team before I  take this medication? They need to know if you have any of these conditions: cancer heart disease high blood pressure history of blood clots history of stroke low levels of folate, iron, or vitamin B12 in the blood seizures an unusual or allergic reaction to erythropoietin, albumin, benzyl alcohol, hamster proteins, other medicines, foods, dyes, or preservatives pregnant or trying to get pregnant breast-feeding How should I use this medication? This medicine is for injection into a vein or under the skin. It is usually given by a health care professional in a hospital or clinic setting. If you get this medicine at home, you will be taught how to prepare and give this medicine. Use exactly as directed. Take your medicine at regular intervals. Do not take your medicine more often than directed. It is important that you put your used needles and syringes in a special sharps container. Do not put them in a trash can. If you do not have a sharps container, call your pharmacist or healthcare provider to get one. A special MedGuide will be given to you by the pharmacist with each prescription and refill. Be sure to read this information carefully each time. Talk to your pediatrician regarding the use of this medicine in children. While this drug may be prescribed for selected conditions, precautions do apply. Overdosage: If you think you have taken too much of this medicine contact a poison control center or emergency room at once. NOTE: This medicine is only for you. Do not share this medicine with others. What if I miss a dose? If you miss a dose, take it as soon as you can. If it is almost time for your next dose, take only that dose. Do not take double or extra doses. What may interact with this medication? Interactions have not been studied. This list may not describe all possible interactions. Give your health care provider a list of all the medicines, herbs, non-prescription drugs, or dietary  supplements you use. Also tell them if you smoke, drink alcohol, or use illegal drugs. Some items may interact with your medicine. What should I watch for while using this medication? Your condition will be monitored carefully while you are receiving this medicine. You may need blood work done while you are taking this medicine. This medicine may cause a decrease in vitamin B6. You should make sure that you get enough vitamin B6 while you are taking this medicine. Discuss the foods you eat and the vitamins you take with your health care professional. What side effects may I notice from receiving this medication? Side effects that you should report to your doctor or health care professional as soon as possible: allergic reactions like skin rash, itching or hives, swelling of the face, lips, or tongue seizures signs and symptoms of a blood clot such as breathing problems; changes in vision; chest pain; severe, sudden headache; pain, swelling, warmth in the leg; trouble speaking; sudden numbness or weakness of the face, arm or leg signs and symptoms of a stroke like changes in vision; confusion; trouble speaking or understanding; severe headaches; sudden numbness or weakness of the face, arm or leg; trouble walking; dizziness; loss of balance or coordination Side effects that usually do not require medical attention (report to your doctor or  health care professional if they continue or are bothersome): chills cough dizziness fever headaches joint pain muscle cramps muscle pain nausea, vomiting pain, redness, or irritation at site where injected This list may not describe all possible side effects. Call your doctor for medical advice about side effects. You may report side effects to FDA at 1-800-FDA-1088. Where should I keep my medication? Keep out of the reach of children. Store in a refrigerator between 2 and 8 degrees C (36 and 46 degrees F). Do not freeze or shake. Throw away any unused  portion if using a single-dose vial. Multi-dose vials can be kept in the refrigerator for up to 21 days after the initial dose. Throw away unused medicine. NOTE: This sheet is a summary. It may not cover all possible information. If you have questions about this medicine, talk to your doctor, pharmacist, or health care provider.  2022 Elsevier/Gold Standard (2017-05-11 00:00:00)

## 2021-09-01 NOTE — Progress Notes (Signed)
Hematology and Oncology Follow Up Visit  Eric [name] [id number] Mar 20, 1941 80 y.o. 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 Hgb is down at 10.1.  No blood loss noted. No bruising or petechiae.  Platelets are 86, WBC count 4.2.  He has mild fatigue at times.  No fever, chills, n/v, cough, rash, dizziness, SOB, chest pain, palpitations, abdominal pain or changes in bowel or bladder habits.  Chronic swelling in his lower extremities is stable. He wears his compression stockings daily for added support.  The intermittent numbness and tingling in his hands and feet is unchanged from baseline.  No falls or syncope to report.  He has a good appetite and is staying well hydrated. His weight is stable at 241 lbs.   ECOG Performance Status: 1 - Symptomatic but completely ambulatory  Medications:  Allergies as of 09/01/2021       Reactions   Iohexol Other (See Comments)    Desc: "shuts kidneys down"   Morphine And Related Shortness Of Breath   Hydrocodone Nausea And Vomiting   Sulfa Antibiotics Other (See Comments)   UNKNOWN REACTION   Sulfonamide Derivatives Other (See Comments)   UNKNOWN REACTION   Tramadol Rash   Trazodone Rash, Other (See Comments)   Pain   Trazodone And Nefazodone Rash   Pain        Medication List        Accurate as of September 01, 2021  1:23 PM. If you have any questions, ask your nurse or doctor.          acetaminophen 325 MG tablet Commonly known as: TYLENOL Take 650 mg by mouth every 6 (six) hours as needed for moderate pain.   allopurinol 100 MG tablet Commonly known as: ZYLOPRIM Take 100 mg by mouth daily.   Eliquis 5 MG Tabs tablet Generic drug: apixaban Take 1  tablet (5 mg total) by mouth 2 (two) times daily.   insulin lispro 100 UNIT/ML KwikPen Commonly known as: HUMALOG Inject 20 Units into the skin 3 (three) times daily.   levothyroxine 100 MCG tablet Commonly known as: SYNTHROID Take 50-100 mcg by mouth See admin instructions. Takes 100 mg daily except Sundays, on Sunday takes 50 mg   OneTouch Delica Lancets 41Y Misc 4 (four) times daily.   silodosin 8 MG Caps capsule Commonly known as: RAPAFLO Take 8 mg by mouth daily.   simvastatin 20 MG tablet Commonly known as: ZOCOR TAKE 1 TABLET BY MOUTH AT  BEDTIME   spironolactone 50 MG tablet Commonly known as: ALDACTONE TAKE 1 TABLET BY MOUTH  DAILY   torsemide 20 MG tablet Commonly known as: DEMADEX Take 1 tablet (20 mg total) by mouth in the morning and at bedtime.   Toujeo SoloStar 300 UNIT/ML Solostar Pen Generic drug: insulin glargine (1 Unit Dial) Inject 46 Units into the skin daily. Sliding scale   Vitamin D (Ergocalciferol) 1.25 MG (50000 UNIT) Caps capsule Commonly known as: DRISDOL Take 50,000 Units by mouth once a week.        Allergies:  Allergies  Allergen Reactions   Iohexol Other (See Comments)     Desc: "shuts kidneys down"    Morphine And  Related Shortness Of Breath   Hydrocodone Nausea And Vomiting   Sulfa Antibiotics Other (See Comments)    UNKNOWN REACTION   Sulfonamide Derivatives Other (See Comments)    UNKNOWN REACTION   Tramadol Rash   Trazodone Rash and Other (See Comments)    Pain   Trazodone And Nefazodone Rash    Pain    Past Medical History, Surgical history, Social history, and Family History were reviewed and updated.  Review of Systems: All other 10 point review of systems is negative.   Physical Exam:  height is 6' (1.829 m) and weight is 241 lb 12.8 oz (109.7 kg). His oral temperature is 97.7 F (36.5 C). His blood pressure is 137/61 and his pulse is 73. His oxygen saturation is 100%.   Wt Readings from Last 3 Encounters:   09/01/21 241 lb 12.8 oz (109.7 kg)  08/08/21 241 lb 12.8 oz (109.7 kg)  07/10/21 239 lb (108.4 kg)    Ocular: Sclerae unicteric, pupils equal, round and reactive to light Ear-nose-throat: Oropharynx clear, dentition fair Lymphatic: No cervical or supraclavicular adenopathy Lungs no rales or rhonchi, good excursion bilaterally Heart regular rate and rhythm, no murmur appreciated Abd soft, nontender, positive bowel sounds MSK no focal spinal tenderness, no joint edema Neuro: non-focal, well-oriented, appropriate affect Breasts: Deferred   Lab Results  Component Value Date   WBC 4.2 09/01/2021   HGB 10.1 (L) 09/01/2021   HCT 29.9 (L) 09/01/2021   MCV 88.5 09/01/2021   PLT 86 (L) 09/01/2021   Lab Results  Component Value Date   FERRITIN 90 08/08/2021   IRON 53 08/08/2021   TIBC 298 08/08/2021   UIBC 245 08/08/2021   IRONPCTSAT 18 (L) 08/08/2021   Lab Results  Component Value Date   RETICCTPCT 3.3 (H) 09/01/2021   RBC 3.39 (L) 09/01/2021   Lab Results  Component Value Date   KPAFRELGTCHN 49.7 (H) 02/17/2020   LAMBDASER 40.1 (H) 02/17/2020   KAPLAMBRATIO 6.90 04/30/2020   Lab Results  Component Value Date   IGGSERUM 1,267 04/29/2020   IGA 231 04/29/2020   IGMSERUM 36 04/29/2020   Lab Results  Component Value Date   TOTALPROTELP 6.5 04/29/2020   ALBUMINELP 2.9 02/17/2020   A1GS 0.4 02/17/2020   A2GS 0.7 02/17/2020   BETS 0.8 02/17/2020   GAMS 0.7 02/17/2020   MSPIKE 0.3 (H) 02/17/2020   SPEI Comment 02/17/2020     Chemistry      Component Value Date/Time   NA 142 08/08/2021 1250   K 4.7 08/08/2021 1250   CL 107 08/08/2021 1250   CO2 28 08/08/2021 1250   BUN 52 (H) 08/08/2021 1250   CREATININE 2.02 (H) 08/08/2021 1250      Component Value Date/Time   CALCIUM 9.5 08/08/2021 1250   ALKPHOS 86 08/08/2021 1250   AST 16 08/08/2021 1250   ALT 14 08/08/2021 1250   BILITOT 1.9 (H) 08/08/2021 1250       Impression and Plan: Eric [name] is a very pleasant  80 yo caucasian gentleman with myelodysplasia -- refractory anemia with ringed sideroblast.  He received Retacrit and Luspatercept today for Hgb 10.1.  Iron studies are pending.  Lab check and injection in 3 weeks, follow-up in 6 weeks.   Lottie Dawson, NP 12/12/20221:23 PM

## 2021-09-02 ENCOUNTER — Telehealth: Payer: Self-pay

## 2021-09-02 ENCOUNTER — Telehealth: Payer: Self-pay | Admitting: Family

## 2021-09-02 LAB — IRON AND TIBC
Iron: 36 ug/dL — ABNORMAL LOW (ref 42–163)
Saturation Ratios: 13 % — ABNORMAL LOW (ref 20–55)
TIBC: 285 ug/dL (ref 202–409)
UIBC: 249 ug/dL (ref 117–376)

## 2021-09-02 LAB — FERRITIN: Ferritin: 139 ng/mL (ref 24–336)

## 2021-09-02 NOTE — Telephone Encounter (Signed)
-----   Message from Volanda Napoleon, MD sent at 09/02/2021  9:30 AM EST ----- Please call and let her know that the iron level is low.  We will treat with a dose of IV iron.  Thank you.  Laurey Arrow

## 2021-09-02 NOTE — Telephone Encounter (Signed)
Called to schedule 1 dose of Feraheme per 12/13 sch msg , left voicemail

## 2021-09-02 NOTE — Telephone Encounter (Signed)
Advised pt via MyChart. Message sent to scheduling for apt.

## 2021-09-04 ENCOUNTER — Telehealth: Payer: Self-pay | Admitting: *Deleted

## 2021-09-04 NOTE — Telephone Encounter (Signed)
Per 09/01/21 los - called and gave upcoming appointments - requested call back to confirm

## 2021-09-15 ENCOUNTER — Encounter: Payer: Self-pay | Admitting: Family

## 2021-09-16 ENCOUNTER — Telehealth: Payer: Self-pay | Admitting: Cardiology

## 2021-09-16 ENCOUNTER — Inpatient Hospital Stay (HOSPITAL_BASED_OUTPATIENT_CLINIC_OR_DEPARTMENT_OTHER): Payer: Medicare Other | Admitting: Family

## 2021-09-16 ENCOUNTER — Encounter: Payer: Self-pay | Admitting: Family

## 2021-09-16 ENCOUNTER — Inpatient Hospital Stay: Payer: Medicare Other

## 2021-09-16 ENCOUNTER — Other Ambulatory Visit: Payer: Self-pay

## 2021-09-16 ENCOUNTER — Encounter: Payer: Self-pay | Admitting: Hematology & Oncology

## 2021-09-16 ENCOUNTER — Inpatient Hospital Stay: Payer: Medicare Other | Attending: Hematology & Oncology

## 2021-09-16 VITALS — BP 144/63 | HR 70 | Temp 97.7°F | Resp 18

## 2021-09-16 DIAGNOSIS — D461 Refractory anemia with ring sideroblasts: Secondary | ICD-10-CM | POA: Diagnosis not present

## 2021-09-16 DIAGNOSIS — D696 Thrombocytopenia, unspecified: Secondary | ICD-10-CM

## 2021-09-16 DIAGNOSIS — T148XXA Other injury of unspecified body region, initial encounter: Secondary | ICD-10-CM

## 2021-09-16 DIAGNOSIS — R21 Rash and other nonspecific skin eruption: Secondary | ICD-10-CM

## 2021-09-16 DIAGNOSIS — R58 Hemorrhage, not elsewhere classified: Secondary | ICD-10-CM

## 2021-09-16 DIAGNOSIS — Z79899 Other long term (current) drug therapy: Secondary | ICD-10-CM | POA: Diagnosis not present

## 2021-09-16 DIAGNOSIS — Z7901 Long term (current) use of anticoagulants: Secondary | ICD-10-CM | POA: Diagnosis not present

## 2021-09-16 LAB — CBC WITH DIFFERENTIAL (CANCER CENTER ONLY)
Abs Immature Granulocytes: 0.05 10*3/uL (ref 0.00–0.07)
Basophils Absolute: 0 10*3/uL (ref 0.0–0.1)
Basophils Relative: 1 %
Eosinophils Absolute: 0.2 10*3/uL (ref 0.0–0.5)
Eosinophils Relative: 4 %
HCT: 31.8 % — ABNORMAL LOW (ref 39.0–52.0)
Hemoglobin: 10.7 g/dL — ABNORMAL LOW (ref 13.0–17.0)
Immature Granulocytes: 1 %
Lymphocytes Relative: 23 %
Lymphs Abs: 1 10*3/uL (ref 0.7–4.0)
MCH: 29.7 pg (ref 26.0–34.0)
MCHC: 33.6 g/dL (ref 30.0–36.0)
MCV: 88.3 fL (ref 80.0–100.0)
Monocytes Absolute: 0.3 10*3/uL (ref 0.1–1.0)
Monocytes Relative: 8 %
Neutro Abs: 2.7 10*3/uL (ref 1.7–7.7)
Neutrophils Relative %: 63 %
Platelet Count: 102 10*3/uL — ABNORMAL LOW (ref 150–400)
RBC: 3.6 MIL/uL — ABNORMAL LOW (ref 4.22–5.81)
RDW: 16.4 % — ABNORMAL HIGH (ref 11.5–15.5)
WBC Count: 4.2 10*3/uL (ref 4.0–10.5)
nRBC: 0 % (ref 0.0–0.2)

## 2021-09-16 LAB — PROTIME-INR
INR: 1.5 — ABNORMAL HIGH (ref 0.8–1.2)
Prothrombin Time: 18.3 seconds — ABNORMAL HIGH (ref 11.4–15.2)

## 2021-09-16 LAB — CMP (CANCER CENTER ONLY)
ALT: 8 U/L (ref 0–44)
AST: 12 U/L — ABNORMAL LOW (ref 15–41)
Albumin: 4 g/dL (ref 3.5–5.0)
Alkaline Phosphatase: 94 U/L (ref 38–126)
Anion gap: 8 (ref 5–15)
BUN: 44 mg/dL — ABNORMAL HIGH (ref 8–23)
CO2: 26 mmol/L (ref 22–32)
Calcium: 9.7 mg/dL (ref 8.9–10.3)
Chloride: 104 mmol/L (ref 98–111)
Creatinine: 1.83 mg/dL — ABNORMAL HIGH (ref 0.61–1.24)
GFR, Estimated: 37 mL/min — ABNORMAL LOW (ref >60)
Glucose, Bld: 212 mg/dL — ABNORMAL HIGH (ref 70–99)
Potassium: 4.5 mmol/L (ref 3.5–5.1)
Sodium: 138 mmol/L (ref 135–145)
Total Bilirubin: 2.3 mg/dL — ABNORMAL HIGH (ref 0.3–1.2)
Total Protein: 6.3 g/dL — ABNORMAL LOW (ref 6.5–8.1)

## 2021-09-16 LAB — IRON AND IRON BINDING CAPACITY (CC-WL,HP ONLY)
Iron: 67 ug/dL (ref 45–182)
Saturation Ratios: 21 % (ref 17.9–39.5)
TIBC: 314 ug/dL (ref 250–450)
UIBC: 247 ug/dL (ref 117–376)

## 2021-09-16 LAB — RETICULOCYTES
Immature Retic Fract: 18.4 % — ABNORMAL HIGH (ref 2.3–15.9)
RBC.: 3.61 MIL/uL — ABNORMAL LOW (ref 4.22–5.81)
Retic Count, Absolute: 192.4 10*3/uL — ABNORMAL HIGH (ref 19.0–186.0)
Retic Ct Pct: 5.3 % — ABNORMAL HIGH (ref 0.4–3.1)

## 2021-09-16 LAB — APTT: aPTT: 38 seconds — ABNORMAL HIGH (ref 24–36)

## 2021-09-16 LAB — FERRITIN: Ferritin: 78 ng/mL (ref 24–336)

## 2021-09-16 MED ORDER — METHYLPREDNISOLONE 4 MG PO TBPK: ORAL_TABLET | ORAL | 0 refills | Status: DC

## 2021-09-16 NOTE — Telephone Encounter (Signed)
New Message:     Patient says he have a rash on his arms and his doctor have put him on Steroids.(Methylprednisolone 4 mg).   He takes 6 pillls the first day and 1 pill each day after that. The concern is, is it alright for patient to take Eliquis also with Steroids?  Should he stop the Eliquis, or cut back on the dose?is.   Pt c/o medication issue:  1. Name of Medication: Eliquis  2. How are you currently taking this medication (dosage and times per day)? 2 times a day  3. Are you having a reaction (difficulty breathing--STAT)?   4. What is your medication issue? Should he take Steroids with this medicine?

## 2021-09-16 NOTE — Progress Notes (Signed)
Hematology and Oncology Follow Up Visit  Eric [name] [id number] 02/07/1941 80 y.o. 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 is here today with his wife for symptom management. He has developed a raised maculopapular rash on the front of both thighs as well as redness and bruising on both forearms starting yesterday. There is itching and burning with the rash.  No vesicles or sores noted on exam.  Radial pulses are 1+ and pedal pulses 2+.  He denies injury. No falls or syncope.  No changes in soaps, creams, detergents or diet.  He has had a few nose bleeds taking up to 10 minutes to stop. No other blood loss noted. No bruising or petechiae.  PT 18.3, INR 1.5 and PTT 38. Platelets 102, Hgb 10.7, MCV 88 and WBC count 4.2.  No fever, chills, n/v, cough, dizziness, SOB, chest pain, palpitations, abdominal pain or change in bowel or bladder habits.  No blood loss noted.  He has a good appetite and is staying well hydrated.   ECOG Performance Status: 1 - Symptomatic but completely ambulatory  Medications:  Allergies as of 09/16/2021       Reactions   Iohexol Other (See Comments)    Desc: "shuts kidneys down"   Morphine And Related Shortness Of Breath   Hydrocodone Nausea And Vomiting   Sulfa Antibiotics Other (See Comments)   UNKNOWN REACTION   Sulfonamide Derivatives Other (See Comments)   UNKNOWN REACTION   Tramadol Rash   Trazodone Rash, Other (See Comments)   Pain   Trazodone And Nefazodone Rash   Pain        Medication List        Accurate as of September 16, 2021 12:02 PM. If you have any questions, ask your nurse or doctor.          acetaminophen 325 MG tablet Commonly known as: TYLENOL Take 650 mg by mouth every 6 (six) hours as  needed for moderate pain.   allopurinol 100 MG tablet Commonly known as: ZYLOPRIM Take 100 mg by mouth daily.   Eliquis 5 MG Tabs tablet Generic drug: apixaban Take 1 tablet (5 mg total) by mouth 2 (two) times daily.   insulin lispro 100 UNIT/ML KwikPen Commonly known as: HUMALOG Inject 20 Units into the skin 3 (three) times daily.   levothyroxine 100 MCG tablet Commonly known as: SYNTHROID Take 50-100 mcg by mouth See admin instructions. Takes 100 mg daily except Sundays, on Sunday takes 50 mg   methylPREDNISolone 4 MG Tbpk tablet Commonly known as: MEDROL DOSEPAK Take as directed on package. Started by: Lottie Dawson, NP   OneTouch Delica Lancets 54M Misc 4 (four) times daily.   silodosin 8 MG Caps capsule Commonly known as: RAPAFLO Take 8 mg by mouth daily.   simvastatin 20 MG tablet Commonly known as: ZOCOR TAKE 1 TABLET BY MOUTH AT  BEDTIME   spironolactone 50 MG tablet Commonly known as: ALDACTONE TAKE 1 TABLET BY MOUTH  DAILY   torsemide 20 MG tablet Commonly known as: DEMADEX Take 1 tablet (20 mg total) by mouth in the morning and at bedtime.   Toujeo SoloStar 300 UNIT/ML Solostar Pen Generic drug: insulin glargine (1 Unit Dial) Inject 46 Units into  the skin daily. Sliding scale   Vitamin D (Ergocalciferol) 1.25 MG (50000 UNIT) Caps capsule Commonly known as: DRISDOL Take 50,000 Units by mouth once a week.        Allergies:  Allergies  Allergen Reactions   Iohexol Other (See Comments)     Desc: "shuts kidneys down"    Morphine And Related Shortness Of Breath   Hydrocodone Nausea And Vomiting   Sulfa Antibiotics Other (See Comments)    UNKNOWN REACTION   Sulfonamide Derivatives Other (See Comments)    UNKNOWN REACTION   Tramadol Rash   Trazodone Rash and Other (See Comments)    Pain   Trazodone And Nefazodone Rash    Pain    Past Medical History, Surgical history, Social history, and Family History were reviewed and updated.  Review of  Systems: All other 10 point review of systems is negative.   Physical Exam:  oral temperature is 97.7 F (36.5 C). His blood pressure is 144/63 (abnormal) and his pulse is 70. His respiration is 18 and oxygen saturation is 100%.   Wt Readings from Last 3 Encounters:  09/01/21 241 lb 12.8 oz (109.7 kg)  08/08/21 241 lb 12.8 oz (109.7 kg)  07/10/21 239 lb (108.4 kg)    Ocular: Sclerae unicteric, pupils equal, round and reactive to light Ear-nose-throat: Oropharynx clear, dentition fair Lymphatic: No cervical or supraclavicular adenopathy Lungs no rales or rhonchi, good excursion bilaterally Heart regular rate and rhythm, no murmur appreciated Abd soft, nontender, positive bowel sounds MSK no focal spinal tenderness, no joint edema Neuro: non-focal, well-oriented, appropriate affect Breasts: Deferred   Lab Results  Component Value Date   WBC 4.2 09/16/2021   HGB 10.7 (L) 09/16/2021   HCT 31.8 (L) 09/16/2021   MCV 88.3 09/16/2021   PLT 102 (L) 09/16/2021   Lab Results  Component Value Date   FERRITIN 139 09/01/2021   IRON 36 (L) 09/01/2021   TIBC 285 09/01/2021   UIBC 249 09/01/2021   IRONPCTSAT 13 (L) 09/01/2021   Lab Results  Component Value Date   RETICCTPCT 5.3 (H) 09/16/2021   RBC 3.60 (L) 09/16/2021   RBC 3.61 (L) 09/16/2021   Lab Results  Component Value Date   KPAFRELGTCHN 49.7 (H) 02/17/2020   LAMBDASER 40.1 (H) 02/17/2020   KAPLAMBRATIO 6.90 04/30/2020   Lab Results  Component Value Date   IGGSERUM 1,267 04/29/2020   IGA 231 04/29/2020   IGMSERUM 36 04/29/2020   Lab Results  Component Value Date   TOTALPROTELP 6.5 04/29/2020   ALBUMINELP 2.9 02/17/2020   A1GS 0.4 02/17/2020   A2GS 0.7 02/17/2020   BETS 0.8 02/17/2020   GAMS 0.7 02/17/2020   MSPIKE 0.3 (H) 02/17/2020   SPEI Comment 02/17/2020     Chemistry      Component Value Date/Time   NA 138 09/16/2021 1002   K 4.5 09/16/2021 1002   CL 104 09/16/2021 1002   CO2 26 09/16/2021 1002    BUN 44 (H) 09/16/2021 1002   CREATININE 1.83 (H) 09/16/2021 1002      Component Value Date/Time   CALCIUM 9.7 09/16/2021 1002   ALKPHOS 94 09/16/2021 1002   AST 12 (L) 09/16/2021 1002   ALT 8 09/16/2021 1002   BILITOT 2.3 (H) 09/16/2021 1002       Impression and Plan: Eric [name] is a very pleasant 80 yo caucasian gentleman with myelodysplasia -- refractory anemia with ringed sideroblast.  He is symptomatic as mentioned above.  We will get him onto  a medrol dose pack for the rash.  Today's lab work including PT/INR and PTT reviewed with Dr. Marin Olp.  We advised that he follow-up with cardiology and let them know about the bruising and nose bleeds.  Has regular follow-up with injection next week.   Lottie Dawson, NP 12/27/202212:02 PM

## 2021-09-16 NOTE — Telephone Encounter (Signed)
Called and spoke with patient, will come in for labs at 1015 today and have arms evaluated by nurse to see if he needs to be seen by MD. Patient agreed and will be in at 1015 for lab work.

## 2021-09-16 NOTE — Telephone Encounter (Signed)
Advised to check with pharmacist as they will look at all medications for interactions. Pt verb dc instructions and had no other questions.

## 2021-09-18 ENCOUNTER — Other Ambulatory Visit: Payer: Self-pay | Admitting: Cardiology

## 2021-09-18 ENCOUNTER — Telehealth: Payer: Self-pay

## 2021-09-18 ENCOUNTER — Encounter: Payer: Self-pay | Admitting: Family

## 2021-09-18 NOTE — Telephone Encounter (Addendum)
Pt called back cussing because he was on hold. Once instructions were given he replied he didn't have a "damn nosebleed now".  ----- Message from Richardo Priest, MD sent at 09/17/2021  3:39 PM EST ----- Regarding: FW: Reduce eliquis to 2.5 md bid Hold 48 hrs if having nosebleeds ----- Message ----- From: Celso Amy, NP Sent: 09/16/2021  12:36 PM EST To: Richardo Priest, MD

## 2021-09-18 NOTE — Telephone Encounter (Signed)
Left vm for pt to callback 

## 2021-09-18 NOTE — Telephone Encounter (Signed)
Torsemide 20 mg # 180 x 1 refill sent to  [facility] ([facility] ) - [place], [place]

## 2021-09-23 ENCOUNTER — Inpatient Hospital Stay: Payer: Medicare Other

## 2021-09-23 ENCOUNTER — Other Ambulatory Visit: Payer: Self-pay

## 2021-09-23 ENCOUNTER — Inpatient Hospital Stay: Payer: Medicare Other | Attending: Hematology & Oncology

## 2021-09-23 DIAGNOSIS — D461 Refractory anemia with ring sideroblasts: Secondary | ICD-10-CM | POA: Diagnosis present

## 2021-09-23 DIAGNOSIS — D509 Iron deficiency anemia, unspecified: Secondary | ICD-10-CM

## 2021-09-23 DIAGNOSIS — D469 Myelodysplastic syndrome, unspecified: Secondary | ICD-10-CM | POA: Diagnosis not present

## 2021-09-23 DIAGNOSIS — Z79899 Other long term (current) drug therapy: Secondary | ICD-10-CM | POA: Insufficient documentation

## 2021-09-23 DIAGNOSIS — D4620 Refractory anemia with excess of blasts, unspecified: Secondary | ICD-10-CM

## 2021-09-23 LAB — CBC WITH DIFFERENTIAL (CANCER CENTER ONLY)
Abs Immature Granulocytes: 0.04 10*3/uL (ref 0.00–0.07)
Basophils Absolute: 0.1 10*3/uL (ref 0.0–0.1)
Basophils Relative: 1 %
Eosinophils Absolute: 0.1 10*3/uL (ref 0.0–0.5)
Eosinophils Relative: 1 %
HCT: 34.3 % — ABNORMAL LOW (ref 39.0–52.0)
Hemoglobin: 11.8 g/dL — ABNORMAL LOW (ref 13.0–17.0)
Immature Granulocytes: 1 %
Lymphocytes Relative: 22 %
Lymphs Abs: 1.3 10*3/uL (ref 0.7–4.0)
MCH: 30.1 pg (ref 26.0–34.0)
MCHC: 34.4 g/dL (ref 30.0–36.0)
MCV: 87.5 fL (ref 80.0–100.0)
Monocytes Absolute: 0.4 10*3/uL (ref 0.1–1.0)
Monocytes Relative: 8 %
Neutro Abs: 3.8 10*3/uL (ref 1.7–7.7)
Neutrophils Relative %: 67 %
Platelet Count: 101 10*3/uL — ABNORMAL LOW (ref 150–400)
RBC: 3.92 MIL/uL — ABNORMAL LOW (ref 4.22–5.81)
RDW: 16.2 % — ABNORMAL HIGH (ref 11.5–15.5)
WBC Count: 5.6 10*3/uL (ref 4.0–10.5)
nRBC: 0 % (ref 0.0–0.2)

## 2021-09-23 LAB — CMP (CANCER CENTER ONLY)
ALT: 13 U/L (ref 0–44)
AST: 11 U/L — ABNORMAL LOW (ref 15–41)
Albumin: 4 g/dL (ref 3.5–5.0)
Alkaline Phosphatase: 87 U/L (ref 38–126)
Anion gap: 7 (ref 5–15)
BUN: 57 mg/dL — ABNORMAL HIGH (ref 8–23)
CO2: 26 mmol/L (ref 22–32)
Calcium: 9.2 mg/dL (ref 8.9–10.3)
Chloride: 107 mmol/L (ref 98–111)
Creatinine: 1.78 mg/dL — ABNORMAL HIGH (ref 0.61–1.24)
GFR, Estimated: 38 mL/min — ABNORMAL LOW (ref >60)
Glucose, Bld: 204 mg/dL — ABNORMAL HIGH (ref 70–99)
Potassium: 4.5 mmol/L (ref 3.5–5.1)
Sodium: 140 mmol/L (ref 135–145)
Total Bilirubin: 2.3 mg/dL — ABNORMAL HIGH (ref 0.3–1.2)
Total Protein: 6.2 g/dL — ABNORMAL LOW (ref 6.5–8.1)

## 2021-09-23 LAB — RETICULOCYTES
Immature Retic Fract: 18.5 % — ABNORMAL HIGH (ref 2.3–15.9)
RBC.: 3.91 MIL/uL — ABNORMAL LOW (ref 4.22–5.81)
Retic Count, Absolute: 208.8 10*3/uL — ABNORMAL HIGH (ref 19.0–186.0)
Retic Ct Pct: 5.3 % — ABNORMAL HIGH (ref 0.4–3.1)

## 2021-09-24 LAB — FERRITIN: Ferritin: 71 ng/mL (ref 24–336)

## 2021-09-24 LAB — IRON AND IRON BINDING CAPACITY (CC-WL,HP ONLY)
Iron: 60 ug/dL (ref 45–182)
Saturation Ratios: 19 % (ref 17.9–39.5)
TIBC: 312 ug/dL (ref 250–450)
UIBC: 252 ug/dL (ref 117–376)

## 2021-10-13 ENCOUNTER — Inpatient Hospital Stay (HOSPITAL_BASED_OUTPATIENT_CLINIC_OR_DEPARTMENT_OTHER): Payer: Medicare Other | Admitting: Family

## 2021-10-13 ENCOUNTER — Other Ambulatory Visit: Payer: Self-pay

## 2021-10-13 ENCOUNTER — Encounter: Payer: Self-pay | Admitting: Family

## 2021-10-13 ENCOUNTER — Inpatient Hospital Stay: Payer: Medicare Other

## 2021-10-13 VITALS — BP 157/59 | HR 78 | Temp 97.5°F | Resp 18 | Wt 249.8 lb

## 2021-10-13 DIAGNOSIS — D518 Other vitamin B12 deficiency anemias: Secondary | ICD-10-CM

## 2021-10-13 DIAGNOSIS — D4620 Refractory anemia with excess of blasts, unspecified: Secondary | ICD-10-CM

## 2021-10-13 DIAGNOSIS — D461 Refractory anemia with ring sideroblasts: Secondary | ICD-10-CM

## 2021-10-13 DIAGNOSIS — D509 Iron deficiency anemia, unspecified: Secondary | ICD-10-CM

## 2021-10-13 DIAGNOSIS — D469 Myelodysplastic syndrome, unspecified: Secondary | ICD-10-CM | POA: Diagnosis not present

## 2021-10-13 LAB — CMP (CANCER CENTER ONLY)
ALT: 9 U/L (ref 0–44)
AST: 12 U/L — ABNORMAL LOW (ref 15–41)
Albumin: 4.1 g/dL (ref 3.5–5.0)
Alkaline Phosphatase: 102 U/L (ref 38–126)
Anion gap: 8 (ref 5–15)
BUN: 49 mg/dL — ABNORMAL HIGH (ref 8–23)
CO2: 27 mmol/L (ref 22–32)
Calcium: 9.5 mg/dL (ref 8.9–10.3)
Chloride: 106 mmol/L (ref 98–111)
Creatinine: 1.87 mg/dL — ABNORMAL HIGH (ref 0.61–1.24)
GFR, Estimated: 36 mL/min — ABNORMAL LOW (ref >60)
Glucose, Bld: 104 mg/dL — ABNORMAL HIGH (ref 70–99)
Potassium: 3.8 mmol/L (ref 3.5–5.1)
Sodium: 141 mmol/L (ref 135–145)
Total Bilirubin: 2.2 mg/dL — ABNORMAL HIGH (ref 0.3–1.2)
Total Protein: 6.4 g/dL — ABNORMAL LOW (ref 6.5–8.1)

## 2021-10-13 LAB — CBC WITH DIFFERENTIAL (CANCER CENTER ONLY)
Abs Immature Granulocytes: 0.04 10*3/uL (ref 0.00–0.07)
Basophils Absolute: 0 10*3/uL (ref 0.0–0.1)
Basophils Relative: 0 %
Eosinophils Absolute: 0.1 10*3/uL (ref 0.0–0.5)
Eosinophils Relative: 2 %
HCT: 29.2 % — ABNORMAL LOW (ref 39.0–52.0)
Hemoglobin: 9.9 g/dL — ABNORMAL LOW (ref 13.0–17.0)
Immature Granulocytes: 1 %
Lymphocytes Relative: 18 %
Lymphs Abs: 0.8 10*3/uL (ref 0.7–4.0)
MCH: 29.7 pg (ref 26.0–34.0)
MCHC: 33.9 g/dL (ref 30.0–36.0)
MCV: 87.7 fL (ref 80.0–100.0)
Monocytes Absolute: 0.4 10*3/uL (ref 0.1–1.0)
Monocytes Relative: 9 %
Neutro Abs: 3.1 10*3/uL (ref 1.7–7.7)
Neutrophils Relative %: 70 %
Platelet Count: 103 10*3/uL — ABNORMAL LOW (ref 150–400)
RBC: 3.33 MIL/uL — ABNORMAL LOW (ref 4.22–5.81)
RDW: 15.9 % — ABNORMAL HIGH (ref 11.5–15.5)
WBC Count: 4.5 10*3/uL (ref 4.0–10.5)
nRBC: 0 % (ref 0.0–0.2)

## 2021-10-13 LAB — RETICULOCYTES
Immature Retic Fract: 17.2 % — ABNORMAL HIGH (ref 2.3–15.9)
RBC.: 3.31 MIL/uL — ABNORMAL LOW (ref 4.22–5.81)
Retic Count, Absolute: 136.7 10*3/uL (ref 19.0–186.0)
Retic Ct Pct: 4.1 % — ABNORMAL HIGH (ref 0.4–3.1)

## 2021-10-13 MED ORDER — EPOETIN ALFA-EPBX 40000 UNIT/ML IJ SOLN
40000.0000 [IU] | Freq: Once | INTRAMUSCULAR | Status: AC
  Administered 2021-10-13: 40000 [IU] via SUBCUTANEOUS
  Filled 2021-10-13: qty 1

## 2021-10-13 MED ORDER — LUSPATERCEPT-AAMT 75 MG ~~LOC~~ SOLR
100.0000 mg | Freq: Once | SUBCUTANEOUS | Status: AC
  Administered 2021-10-13: 100 mg via SUBCUTANEOUS
  Filled 2021-10-13: qty 1.5

## 2021-10-13 NOTE — Patient Instructions (Signed)
Luspatercept Injection What is this medication? LUSPATERCEPT (lus PAT er sept) treats low levels of red blood cells (anemia) in the body in people with beta thalassemia or myelodysplastic syndromes. It works by helping the body make more red blood cells. This medicine may be used for other purposes; ask your health care provider or pharmacist if you have questions. COMMON BRAND NAME(S): REBLOZYL What should I tell my care team before I take this medication? They need to know if you have any of these conditions: Have had your spleen removed High blood pressure History of blood clots Tobacco use An unusual or allergic reaction to luspatercept, other medications, foods, dyes or preservatives Pregnant or trying to get pregnant Breast-feeding How should I use this medication? This medication is for injection under the skin. It is given by your care team in a hospital or clinic setting. Talk to your care team about the use of the medication in children. This medication is not approved for use in children. Overdosage: If you think you have taken too much of this medicine contact a poison control center or emergency room at once. NOTE: This medicine is only for you. Do not share this medicine with others. What if I miss a dose? Keep appointments for follow-up doses. It is important not to miss your dose. Call your care team if you are unable to keep an appointment. What may interact with this medication? Interactions are not expected. This list may not describe all possible interactions. Give your health care provider a list of all the medicines, herbs, non-prescription drugs, or dietary supplements you use. Also tell them if you smoke, drink alcohol, or use illegal drugs. Some items may interact with your medicine. What should I watch for while using this medication? Your condition will be monitored carefully while you are receiving this medication. Talk to your care team if you wish to become  pregnant or think you might be pregnant. This medication can cause serious birth defects. Discuss contraceptive options with your care team. Do not breastfeed while taking this medication. You may need blood work done while you are taking this medication. What side effects may I notice from receiving this medication? Side effects that you should report to your care team as soon as possible: Allergic reactions--skin rash, itching, hives, swelling of the face, lips, tongue, or throat Blood clot--pain, swelling, or warmth in the leg, shortness of breath, chest pain Increase in blood pressure Severe back pain, numbness or weakness of the hands, arms, legs, or feet, loss of coordination, loss of bowel or bladder control Side effects that usually do not require medical attention (report these to your care team if they continue or are bothersome): Bone pain Dizziness Fatigue Headache Joint pain Muscle pain Stomach pain This list may not describe all possible side effects. Call your doctor for medical advice about side effects. You may report side effects to FDA at 1-800-FDA-1088. Where should I keep my medication? This medication is given in a hospital or clinic. It will not be stored at home. NOTE: This sheet is a summary. It may not cover all possible information. If you have questions about this medicine, talk to your doctor, pharmacist, or health care provider.  2022 Elsevier/Gold Standard (2021-04-04 00:00:00) Epoetin Alfa injection What is this medication? EPOETIN ALFA (e POE e tin AL fa) helps your body make more red blood cells. This medicine is used to treat anemia caused by chronic kidney disease, cancer chemotherapy, or HIV-therapy. It may also be  used before surgery if you have anemia. This medicine may be used for other purposes; ask your health care provider or pharmacist if you have questions. COMMON BRAND NAME(S): Epogen, Procrit, Retacrit What should I tell my care team before I  take this medication? They need to know if you have any of these conditions: cancer heart disease high blood pressure history of blood clots history of stroke low levels of folate, iron, or vitamin B12 in the blood seizures an unusual or allergic reaction to erythropoietin, albumin, benzyl alcohol, hamster proteins, other medicines, foods, dyes, or preservatives pregnant or trying to get pregnant breast-feeding How should I use this medication? This medicine is for injection into a vein or under the skin. It is usually given by a health care professional in a hospital or clinic setting. If you get this medicine at home, you will be taught how to prepare and give this medicine. Use exactly as directed. Take your medicine at regular intervals. Do not take your medicine more often than directed. It is important that you put your used needles and syringes in a special sharps container. Do not put them in a trash can. If you do not have a sharps container, call your pharmacist or healthcare provider to get one. A special MedGuide will be given to you by the pharmacist with each prescription and refill. Be sure to read this information carefully each time. Talk to your pediatrician regarding the use of this medicine in children. While this drug may be prescribed for selected conditions, precautions do apply. Overdosage: If you think you have taken too much of this medicine contact a poison control center or emergency room at once. NOTE: This medicine is only for you. Do not share this medicine with others. What if I miss a dose? If you miss a dose, take it as soon as you can. If it is almost time for your next dose, take only that dose. Do not take double or extra doses. What may interact with this medication? Interactions have not been studied. This list may not describe all possible interactions. Give your health care provider a list of all the medicines, herbs, non-prescription drugs, or dietary  supplements you use. Also tell them if you smoke, drink alcohol, or use illegal drugs. Some items may interact with your medicine. What should I watch for while using this medication? Your condition will be monitored carefully while you are receiving this medicine. You may need blood work done while you are taking this medicine. This medicine may cause a decrease in vitamin B6. You should make sure that you get enough vitamin B6 while you are taking this medicine. Discuss the foods you eat and the vitamins you take with your health care professional. What side effects may I notice from receiving this medication? Side effects that you should report to your doctor or health care professional as soon as possible: allergic reactions like skin rash, itching or hives, swelling of the face, lips, or tongue seizures signs and symptoms of a blood clot such as breathing problems; changes in vision; chest pain; severe, sudden headache; pain, swelling, warmth in the leg; trouble speaking; sudden numbness or weakness of the face, arm or leg signs and symptoms of a stroke like changes in vision; confusion; trouble speaking or understanding; severe headaches; sudden numbness or weakness of the face, arm or leg; trouble walking; dizziness; loss of balance or coordination Side effects that usually do not require medical attention (report to your doctor or  health care professional if they continue or are bothersome): chills cough dizziness fever headaches joint pain muscle cramps muscle pain nausea, vomiting pain, redness, or irritation at site where injected This list may not describe all possible side effects. Call your doctor for medical advice about side effects. You may report side effects to FDA at 1-800-FDA-1088. Where should I keep my medication? Keep out of the reach of children. Store in a refrigerator between 2 and 8 degrees C (36 and 46 degrees F). Do not freeze or shake. Throw away any unused  portion if using a single-dose vial. Multi-dose vials can be kept in the refrigerator for up to 21 days after the initial dose. Throw away unused medicine. NOTE: This sheet is a summary. It may not cover all possible information. If you have questions about this medicine, talk to your doctor, pharmacist, or health care provider.  2022 Elsevier/Gold Standard (2017-05-11 00:00:00)

## 2021-10-13 NOTE — Progress Notes (Signed)
Hematology and Oncology Follow Up Visit  Eric [name] [id number] 1940/11/11 81 y.o. 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 He is doing quite well and has no complaints at this time.  His rash resolved with medrol dose pack.  No blood loss noted. No bruising or petechiae.  He has not received his Luspatercept or Retacrit in over a month. Hgb is 9.9.  His SOB with exertion is unchanged from baseline. He takes a break to rest as needed.  No fever, chills, n/v, cough, rash, dizziness, chest pain, palpitations, abdominal pain or changes in bowel or bladder habits.  He has chronic swelling in his lower extremities that is unchanged. He wears his compression stockings and elevates his legs when he can. He also takes Aldactone and Demadex.  No falls or syncope to report.  Intermittent tingling in his fingertips and feet.  He has a good appetite and is doing his best to hydrate properly throughout the day. His weight is 249 lbs.   ECOG Performance Status: 1 - Symptomatic but completely ambulatory  Medications:  Allergies as of 10/13/2021       Reactions   Iohexol Other (See Comments)    Desc: "shuts kidneys down"   Morphine And Related Shortness Of Breath   Hydrocodone Nausea And Vomiting   Sulfa Antibiotics Other (See Comments)   UNKNOWN REACTION   Sulfonamide Derivatives Other (See Comments)   UNKNOWN REACTION   Tramadol Rash   Trazodone Rash, Other (See Comments)   Pain   Trazodone And Nefazodone Rash   Pain        Medication List        Accurate as of October 13, 2021  1:58 PM. If you have any questions, ask your nurse or doctor.          acetaminophen 325 MG tablet Commonly known as:  TYLENOL Take 650 mg by mouth every 6 (six) hours as needed for moderate pain.   allopurinol 100 MG tablet Commonly known as: ZYLOPRIM Take 100 mg by mouth daily.   Eliquis 5 MG Tabs tablet Generic drug: apixaban Take 1 tablet (5 mg total) by mouth 2 (two) times daily.   insulin lispro 100 UNIT/ML KwikPen Commonly known as: HUMALOG Inject 20 Units into the skin 3 (three) times daily.   levothyroxine 100 MCG tablet Commonly known as: SYNTHROID Take 50-100 mcg by mouth See admin instructions. Takes 100 mg daily except Sundays, on Sunday takes 50 mg   methylPREDNISolone 4 MG Tbpk tablet Commonly known as: MEDROL DOSEPAK Take as directed on package.   OneTouch Delica Lancets 08M Misc 4 (four) times daily.   silodosin 8 MG Caps capsule Commonly known as: RAPAFLO Take 8 mg by mouth daily.   simvastatin 20 MG tablet Commonly known as: ZOCOR TAKE 1 TABLET BY MOUTH AT  BEDTIME   spironolactone 50 MG tablet Commonly known as: ALDACTONE TAKE 1 TABLET BY MOUTH  DAILY   torsemide 20 MG tablet Commonly known as: DEMADEX TAKE 1 TABLET BY MOUTH IN  THE MORNING AND AT BEDTIME   Toujeo SoloStar 300 UNIT/ML Solostar Pen Generic drug: insulin glargine (1 Unit Dial) Inject 46 Units into the skin daily.  Sliding scale   Vitamin D (Ergocalciferol) 1.25 MG (50000 UNIT) Caps capsule Commonly known as: DRISDOL Take 50,000 Units by mouth once a week.        Allergies:  Allergies  Allergen Reactions   Iohexol Other (See Comments)     Desc: "shuts kidneys down"    Morphine And Related Shortness Of Breath   Hydrocodone Nausea And Vomiting   Sulfa Antibiotics Other (See Comments)    UNKNOWN REACTION   Sulfonamide Derivatives Other (See Comments)    UNKNOWN REACTION   Tramadol Rash   Trazodone Rash and Other (See Comments)    Pain   Trazodone And Nefazodone Rash    Pain    Past Medical History, Surgical history, Social history, and Family History were reviewed and  updated.  Review of Systems: All other 10 point review of systems is negative.   Physical Exam:  weight is 249 lb 12.8 oz (113.3 kg). His oral temperature is 97.5 F (36.4 C) (abnormal). His blood pressure is 157/59 (abnormal) and his pulse is 78. His respiration is 18 and oxygen saturation is 100%.   Wt Readings from Last 3 Encounters:  10/13/21 249 lb 12.8 oz (113.3 kg)  09/01/21 241 lb 12.8 oz (109.7 kg)  08/08/21 241 lb 12.8 oz (109.7 kg)    Ocular: Sclerae unicteric, pupils equal, round and reactive to light Ear-nose-throat: Oropharynx clear, dentition fair Lymphatic: No cervical or supraclavicular adenopathy Lungs no rales or rhonchi, good excursion bilaterally Heart regular rate and rhythm, no murmur appreciated Abd soft, nontender, positive bowel sounds MSK no focal spinal tenderness, no joint edema Neuro: non-focal, well-oriented, appropriate affect Breasts: Deferred   Lab Results  Component Value Date   WBC 4.5 10/13/2021   HGB 9.9 (L) 10/13/2021   HCT 29.2 (L) 10/13/2021   MCV 87.7 10/13/2021   PLT 103 (L) 10/13/2021   Lab Results  Component Value Date   FERRITIN 71 09/23/2021   IRON 60 09/23/2021   TIBC 312 09/23/2021   UIBC 252 09/23/2021   IRONPCTSAT 19 09/23/2021   Lab Results  Component Value Date   RETICCTPCT 4.1 (H) 10/13/2021   RBC 3.33 (L) 10/13/2021   RBC 3.31 (L) 10/13/2021   Lab Results  Component Value Date   KPAFRELGTCHN 49.7 (H) 02/17/2020   LAMBDASER 40.1 (H) 02/17/2020   KAPLAMBRATIO 6.90 04/30/2020   Lab Results  Component Value Date   IGGSERUM 1,267 04/29/2020   IGA 231 04/29/2020   IGMSERUM 36 04/29/2020   Lab Results  Component Value Date   TOTALPROTELP 6.5 04/29/2020   ALBUMINELP 2.9 02/17/2020   A1GS 0.4 02/17/2020   A2GS 0.7 02/17/2020   BETS 0.8 02/17/2020   GAMS 0.7 02/17/2020   MSPIKE 0.3 (H) 02/17/2020   SPEI Comment 02/17/2020     Chemistry      Component Value Date/Time   NA 141 10/13/2021 1250   K 3.8  10/13/2021 1250   CL 106 10/13/2021 1250   CO2 27 10/13/2021 1250   BUN 49 (H) 10/13/2021 1250   CREATININE 1.87 (H) 10/13/2021 1250      Component Value Date/Time   CALCIUM 9.5 10/13/2021 1250   ALKPHOS 102 10/13/2021 1250   AST 12 (L) 10/13/2021 1250   ALT 9 10/13/2021 1250   BILITOT 2.2 (H) 10/13/2021 1250       Impression and Plan: Eric [name] is a very pleasant 81 yo caucasian gentleman with myelodysplasia -- refractory anemia with ringed sideroblast.  He received Luspatercept and Retacrit  today, Hgb 9.9.  Iron studies pending.  Lab check and injection in 3 weeks, follow-up in 6 weeks.   Lottie Dawson, NP 1/23/20231:58 PM

## 2021-10-13 NOTE — Addendum Note (Signed)
Addended by: Burney Gauze R on: 10/13/2021 02:08 PM   Modules accepted: Orders

## 2021-10-14 ENCOUNTER — Telehealth: Payer: Self-pay | Admitting: *Deleted

## 2021-10-14 ENCOUNTER — Other Ambulatory Visit: Payer: Self-pay | Admitting: Family

## 2021-10-14 LAB — IRON AND IRON BINDING CAPACITY (CC-WL,HP ONLY)
Iron: 56 ug/dL (ref 45–182)
Saturation Ratios: 17 % — ABNORMAL LOW (ref 17.9–39.5)
TIBC: 325 ug/dL (ref 250–450)
UIBC: 269 ug/dL (ref 117–376)

## 2021-10-14 LAB — FERRITIN: Ferritin: 90 ng/mL (ref 24–336)

## 2021-10-14 NOTE — Telephone Encounter (Signed)
Per 10/13/21 los - called and gave upcoming appointments - confirmed

## 2021-10-14 NOTE — Telephone Encounter (Signed)
-----   Message from Volanda Napoleon, MD sent at 10/14/2021 12:04 PM EST ----- Please make sure that he gets IV iron.  The iron is on the low side.  Thanks.  Laurey Arrow

## 2021-10-14 NOTE — Telephone Encounter (Signed)
Unable to reach pt upon multiple attempts. Lmovm for pt with results and to expect a call from scheduling.

## 2021-10-14 NOTE — Telephone Encounter (Signed)
Per scheduling message Judson Roch - called patient to get scheduled for (2) doses of IV Iron. Patient refused, citing that he had too much that needs to be done and didn't have time.

## 2021-11-03 ENCOUNTER — Other Ambulatory Visit: Payer: Self-pay

## 2021-11-03 ENCOUNTER — Inpatient Hospital Stay: Payer: Medicare Other

## 2021-11-03 ENCOUNTER — Inpatient Hospital Stay: Payer: Medicare Other | Attending: Hematology & Oncology

## 2021-11-03 VITALS — BP 179/71 | HR 68 | Temp 97.7°F | Resp 18

## 2021-11-03 DIAGNOSIS — D461 Refractory anemia with ring sideroblasts: Secondary | ICD-10-CM | POA: Insufficient documentation

## 2021-11-03 DIAGNOSIS — D518 Other vitamin B12 deficiency anemias: Secondary | ICD-10-CM

## 2021-11-03 DIAGNOSIS — D4620 Refractory anemia with excess of blasts, unspecified: Secondary | ICD-10-CM

## 2021-11-03 LAB — CMP (CANCER CENTER ONLY)
ALT: 8 U/L (ref 0–44)
AST: 12 U/L — ABNORMAL LOW (ref 15–41)
Albumin: 3.8 g/dL (ref 3.5–5.0)
Alkaline Phosphatase: 96 U/L (ref 38–126)
Anion gap: 9 (ref 5–15)
BUN: 36 mg/dL — ABNORMAL HIGH (ref 8–23)
CO2: 27 mmol/L (ref 22–32)
Calcium: 9 mg/dL (ref 8.9–10.3)
Chloride: 106 mmol/L (ref 98–111)
Creatinine: 1.94 mg/dL — ABNORMAL HIGH (ref 0.61–1.24)
GFR, Estimated: 34 mL/min — ABNORMAL LOW (ref >60)
Glucose, Bld: 119 mg/dL — ABNORMAL HIGH (ref 70–99)
Potassium: 4.6 mmol/L (ref 3.5–5.1)
Sodium: 142 mmol/L (ref 135–145)
Total Bilirubin: 2.5 mg/dL — ABNORMAL HIGH (ref 0.3–1.2)
Total Protein: 6.5 g/dL (ref 6.5–8.1)

## 2021-11-03 LAB — CBC WITH DIFFERENTIAL (CANCER CENTER ONLY)
Abs Immature Granulocytes: 0.07 10*3/uL (ref 0.00–0.07)
Basophils Absolute: 0 10*3/uL (ref 0.0–0.1)
Basophils Relative: 1 %
Eosinophils Absolute: 0.1 10*3/uL (ref 0.0–0.5)
Eosinophils Relative: 2 %
HCT: 30 % — ABNORMAL LOW (ref 39.0–52.0)
Hemoglobin: 9.8 g/dL — ABNORMAL LOW (ref 13.0–17.0)
Immature Granulocytes: 1 %
Lymphocytes Relative: 18 %
Lymphs Abs: 0.9 10*3/uL (ref 0.7–4.0)
MCH: 29.4 pg (ref 26.0–34.0)
MCHC: 32.7 g/dL (ref 30.0–36.0)
MCV: 90.1 fL (ref 80.0–100.0)
Monocytes Absolute: 0.4 10*3/uL (ref 0.1–1.0)
Monocytes Relative: 8 %
Neutro Abs: 3.6 10*3/uL (ref 1.7–7.7)
Neutrophils Relative %: 70 %
Platelet Count: 103 10*3/uL — ABNORMAL LOW (ref 150–400)
RBC: 3.33 MIL/uL — ABNORMAL LOW (ref 4.22–5.81)
RDW: 16.8 % — ABNORMAL HIGH (ref 11.5–15.5)
WBC Count: 5.2 10*3/uL (ref 4.0–10.5)
nRBC: 0 % (ref 0.0–0.2)

## 2021-11-03 LAB — RETICULOCYTES
Immature Retic Fract: 21.8 % — ABNORMAL HIGH (ref 2.3–15.9)
RBC.: 3.35 MIL/uL — ABNORMAL LOW (ref 4.22–5.81)
Retic Count, Absolute: 185.6 10*3/uL (ref 19.0–186.0)
Retic Ct Pct: 5.5 % — ABNORMAL HIGH (ref 0.4–3.1)

## 2021-11-03 MED ORDER — EPOETIN ALFA-EPBX 40000 UNIT/ML IJ SOLN
40000.0000 [IU] | Freq: Once | INTRAMUSCULAR | Status: AC
  Administered 2021-11-03: 40000 [IU] via SUBCUTANEOUS
  Filled 2021-11-03: qty 1

## 2021-11-03 NOTE — Patient Instructions (Signed)
Anemia °Anemia is a condition in which there is not enough red blood cells or hemoglobin in the blood. Hemoglobin is a substance in red blood cells that carries oxygen. °When you do not have enough red blood cells or hemoglobin (are anemic), your body cannot get enough oxygen and your organs may not work properly. As a result, you may feel very tired or have other problems. °What are the causes? °Common causes of anemia include: °Excessive bleeding. Anemia can be caused by excessive bleeding inside or outside the body, including bleeding from the intestines or from heavy menstrual periods in females. °Poor nutrition. °Long-lasting (chronic) kidney, thyroid, and liver disease. °Bone marrow disorders, spleen problems, and blood disorders. °Cancer and treatments for cancer. °HIV (human immunodeficiency virus) and AIDS (acquired immunodeficiency syndrome). °Infections, medicines, and autoimmune disorders that destroy red blood cells. °What are the signs or symptoms? °Symptoms of this condition include: °Minor weakness. °Dizziness. °Headache, or difficulties concentrating and sleeping. °Heartbeats that feel irregular or faster than normal (palpitations). °Shortness of breath, especially with exercise. °Pale skin, lips, and nails, or cold hands and feet. °Indigestion and nausea. °Symptoms may occur suddenly or develop slowly. If your anemia is mild, you may not have symptoms. °How is this diagnosed? °This condition is diagnosed based on blood tests, your medical history, and a physical exam. In some cases, a test may be needed in which cells are removed from the soft tissue inside of a bone and looked at under a microscope (bone marrow biopsy). Your health care provider may also check your stool (feces) for blood and may do additional testing to look for the cause of your bleeding. °Other tests may include: °Imaging tests, such as a CT scan or MRI. °A procedure to see inside your esophagus and stomach (endoscopy). °A  procedure to see inside your colon and rectum (colonoscopy). °How is this treated? °Treatment for this condition depends on the cause. If you continue to lose a lot of blood, you may need to be treated at a hospital. Treatment may include: °Taking supplements of iron, vitamin B12, or folic acid. °Taking a hormone medicine (erythropoietin) that can help to stimulate red blood cell growth. °Having a blood transfusion. This may be needed if you lose a lot of blood. °Making changes to your diet. °Having surgery to remove your spleen. °Follow these instructions at home: °Take over-the-counter and prescription medicines only as told by your health care provider. °Take supplements only as told by your health care provider. °Follow any diet instructions that you were given by your health care provider. °Keep all follow-up visits as told by your health care provider. This is important. °Contact a health care provider if: °You develop new bleeding anywhere in the body. °Get help right away if: °You are very weak. °You are short of breath. °You have pain in your abdomen or chest. °You are dizzy or feel faint. °You have trouble concentrating. °You have bloody stools, black stools, or tarry stools. °You vomit repeatedly or you vomit up blood. °These symptoms may represent a serious problem that is an emergency. Do not wait to see if the symptoms will go away. Get medical help right away. Call your local emergency services (911 in the [place] Do not drive yourself to the hospital. °Summary °Anemia is a condition in which you do not have enough red blood cells or enough of a substance in your red blood cells that carries oxygen (hemoglobin). °Symptoms may occur suddenly or develop slowly. °If your anemia is   mild, you may not have symptoms. °This condition is diagnosed with blood tests, a medical history, and a physical exam. Other tests may be needed. °Treatment for this condition depends on the cause of the anemia. °This  information is not intended to replace advice given to you by your health care provider. Make sure you discuss any questions you have with your health care provider. °Document Revised: 08/15/2019 Document Reviewed: 08/15/2019 °Elsevier Patient Education © 2022 [employer] ° °

## 2021-11-03 NOTE — Progress Notes (Signed)
Per dr Marin Olp, okay for patient to get Luspatercept injection at scheduled appt 11/24/2021

## 2021-11-17 ENCOUNTER — Other Ambulatory Visit: Payer: Self-pay | Admitting: Cardiology

## 2021-11-24 ENCOUNTER — Inpatient Hospital Stay: Payer: Medicare Other

## 2021-11-24 ENCOUNTER — Other Ambulatory Visit: Payer: Self-pay

## 2021-11-24 ENCOUNTER — Inpatient Hospital Stay: Payer: Medicare Other | Attending: Hematology & Oncology

## 2021-11-24 ENCOUNTER — Inpatient Hospital Stay (HOSPITAL_BASED_OUTPATIENT_CLINIC_OR_DEPARTMENT_OTHER): Payer: Medicare Other | Admitting: Family

## 2021-11-24 VITALS — BP 146/74 | HR 63 | Temp 98.0°F | Resp 18 | Ht 72.0 in | Wt 243.8 lb

## 2021-11-24 DIAGNOSIS — D4620 Refractory anemia with excess of blasts, unspecified: Secondary | ICD-10-CM

## 2021-11-24 DIAGNOSIS — D509 Iron deficiency anemia, unspecified: Secondary | ICD-10-CM

## 2021-11-24 DIAGNOSIS — D461 Refractory anemia with ring sideroblasts: Secondary | ICD-10-CM | POA: Diagnosis not present

## 2021-11-24 DIAGNOSIS — D518 Other vitamin B12 deficiency anemias: Secondary | ICD-10-CM

## 2021-11-24 DIAGNOSIS — Z79899 Other long term (current) drug therapy: Secondary | ICD-10-CM | POA: Diagnosis not present

## 2021-11-24 DIAGNOSIS — D641 Secondary sideroblastic anemia due to disease: Secondary | ICD-10-CM | POA: Diagnosis not present

## 2021-11-24 LAB — IRON AND IRON BINDING CAPACITY (CC-WL,HP ONLY)
Iron: 68 ug/dL (ref 45–182)
Saturation Ratios: 19 % (ref 17.9–39.5)
TIBC: 356 ug/dL (ref 250–450)
UIBC: 288 ug/dL (ref 117–376)

## 2021-11-24 LAB — CMP (CANCER CENTER ONLY)
ALT: 13 U/L (ref 0–44)
AST: 14 U/L — ABNORMAL LOW (ref 15–41)
Albumin: 4 g/dL (ref 3.5–5.0)
Alkaline Phosphatase: 82 U/L (ref 38–126)
Anion gap: 9 (ref 5–15)
BUN: 62 mg/dL — ABNORMAL HIGH (ref 8–23)
CO2: 24 mmol/L (ref 22–32)
Calcium: 9 mg/dL (ref 8.9–10.3)
Chloride: 108 mmol/L (ref 98–111)
Creatinine: 1.79 mg/dL — ABNORMAL HIGH (ref 0.61–1.24)
GFR, Estimated: 38 mL/min — ABNORMAL LOW (ref >60)
Glucose, Bld: 83 mg/dL (ref 70–99)
Potassium: 4.6 mmol/L (ref 3.5–5.1)
Sodium: 141 mmol/L (ref 135–145)
Total Bilirubin: 2.6 mg/dL — ABNORMAL HIGH (ref 0.3–1.2)
Total Protein: 6.5 g/dL (ref 6.5–8.1)

## 2021-11-24 LAB — CBC WITH DIFFERENTIAL (CANCER CENTER ONLY)
Abs Immature Granulocytes: 0.02 10*3/uL (ref 0.00–0.07)
Basophils Absolute: 0 10*3/uL (ref 0.0–0.1)
Basophils Relative: 1 %
Eosinophils Absolute: 0.1 10*3/uL (ref 0.0–0.5)
Eosinophils Relative: 1 %
HCT: 31.4 % — ABNORMAL LOW (ref 39.0–52.0)
Hemoglobin: 10.7 g/dL — ABNORMAL LOW (ref 13.0–17.0)
Immature Granulocytes: 0 %
Lymphocytes Relative: 19 %
Lymphs Abs: 1.2 10*3/uL (ref 0.7–4.0)
MCH: 30.1 pg (ref 26.0–34.0)
MCHC: 34.1 g/dL (ref 30.0–36.0)
MCV: 88.2 fL (ref 80.0–100.0)
Monocytes Absolute: 0.4 10*3/uL (ref 0.1–1.0)
Monocytes Relative: 7 %
Neutro Abs: 4.6 10*3/uL (ref 1.7–7.7)
Neutrophils Relative %: 72 %
Platelet Count: 100 10*3/uL — ABNORMAL LOW (ref 150–400)
RBC: 3.56 MIL/uL — ABNORMAL LOW (ref 4.22–5.81)
RDW: 16.2 % — ABNORMAL HIGH (ref 11.5–15.5)
WBC Count: 6.3 10*3/uL (ref 4.0–10.5)
nRBC: 0 % (ref 0.0–0.2)

## 2021-11-24 LAB — RETICULOCYTES
Immature Retic Fract: 11.8 % (ref 2.3–15.9)
RBC.: 3.58 MIL/uL — ABNORMAL LOW (ref 4.22–5.81)
Retic Count, Absolute: 167.2 10*3/uL (ref 19.0–186.0)
Retic Ct Pct: 4.7 % — ABNORMAL HIGH (ref 0.4–3.1)

## 2021-11-24 MED ORDER — LUSPATERCEPT-AAMT 75 MG ~~LOC~~ SOLR
100.0000 mg | Freq: Once | SUBCUTANEOUS | Status: AC
  Administered 2021-11-24: 100 mg via SUBCUTANEOUS
  Filled 2021-11-24: qty 1.5

## 2021-11-24 MED ORDER — EPOETIN ALFA-EPBX 40000 UNIT/ML IJ SOLN
40000.0000 [IU] | Freq: Once | INTRAMUSCULAR | Status: AC
  Administered 2021-11-24: 40000 [IU] via SUBCUTANEOUS
  Filled 2021-11-24: qty 1

## 2021-11-24 NOTE — Progress Notes (Signed)
?Hematology and Oncology Follow Up Visit ? ?Eric [name] ?[id number] ?1941/09/11 81 y.o. ?11/24/2021 ? ? 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  ?Current Therapy:        ?Luspatercept 1.0 mg/m2 sq q 4 week -- started on 07/18/2020 ?Retacrit 40,000 units sq for Hgb < 11 ?IV iron as indicated  ?  ?Interim History:  [name] is here today with his wife for follow-up and injection. He is doing well and has no complaints at this time.  ?He has history of CHF and states that his SOB with exertion is unchanged from baseline.  ?Hgb is 10.7!  ?No blood loss, bruising or petechiae noted.  ?No fever, chills, n/v, cough, rash, dizziness, SOB, chest pain, palpitations, abdominal pain or changes in bowel or bladder habits.  ?Chronic mild swelling in his feet and ankle is unchanged. Pedal pulses are 1+.  ?No falls or syncope to report.  ?He has neuropathy in his feet and ring and pinky fingers of the left hand.  ?He has a good appetite and feels he is staying well hydrated throughout the day. His weight is 243 lbs.  ? ?ECOG Performance Status: 1 - Symptomatic but completely ambulatory ? ?Medications:  ?Allergies as of 11/24/2021   ? ?   Reactions  ? Iohexol Other (See Comments)  ?  Desc: "shuts kidneys down"  ? Morphine And Related Shortness Of Breath  ? Hydrocodone Nausea And Vomiting  ? Sulfa Antibiotics Other (See Comments)  ? UNKNOWN REACTION  ? Sulfonamide Derivatives Other (See Comments)  ? UNKNOWN REACTION  ? Tramadol Rash  ? Trazodone Rash, Other (See Comments)  ? Pain  ? Trazodone And Nefazodone Rash  ? Pain  ? ?  ? ?  ?Medication List  ?  ? ?  ? Accurate as of November 24, 2021  1:30 PM. If you have any questions, ask your nurse or doctor.  ?  ?  ? ?  ? ?STOP taking these medications   ? ?methylPREDNISolone 4 MG Tbpk tablet ?Commonly known as: MEDROL DOSEPAK ?Stopped by: Lottie Dawson, NP ?  ? ?  ? ?TAKE these medications   ? ?acetaminophen 325 MG tablet ?Commonly known as:  TYLENOL ?Take 650 mg by mouth every 6 (six) hours as needed for moderate pain. ?  ?acetaminophen 500 MG tablet ?Commonly known as: TYLENOL ?Take by mouth. ?  ?allopurinol 100 MG tablet ?Commonly known as: ZYLOPRIM ?Take 100 mg by mouth daily. ?  ?Eliquis 5 MG Tabs tablet ?Generic drug: apixaban ?Take 1 tablet (5 mg total) by mouth 2 (two) times daily. ?  ?insulin lispro 100 UNIT/ML KwikPen ?Commonly known as: HUMALOG ?Inject 20 Units into the skin 3 (three) times daily. ?  ?levothyroxine 100 MCG tablet ?Commonly known as: SYNTHROID ?Take 50-100 mcg by mouth See admin instructions. Takes 100 mg daily except Sundays, on Sunday takes 50 mg ?  ?levothyroxine 100 MCG tablet ?Commonly known as: SYNTHROID ?Take 1 tablet by mouth daily. ?  ?OneTouch Delica Lancets 03T Misc ?4 (four) times daily. ?  ?silodosin 8 MG Caps capsule ?Commonly known as: RAPAFLO ?Take 8 mg by mouth daily. ?  ?simvastatin 20 MG tablet ?Commonly known as: ZOCOR ?Take 1 tablet (20 mg total) by mouth at bedtime. ?  ?spironolactone 50 MG tablet ?Commonly known as: ALDACTONE ?Take 1 tablet (50 mg total) by mouth daily. ?  ?torsemide 20 MG tablet ?Commonly known as: DEMADEX ?TAKE 1 TABLET BY MOUTH IN  THE MORNING AND  AT BEDTIME ?  ?Toujeo SoloStar 300 UNIT/ML Solostar Pen ?Generic drug: insulin glargine (1 Unit Dial) ?Inject 46 Units into the skin daily. Sliding scale ?  ?triamcinolone cream 0.1 % ?Commonly known as: KENALOG ?Apply 1 application. topically 2 (two) times daily. ?  ?Vitamin D (Ergocalciferol) 1.25 MG (50000 UNIT) Caps capsule ?Commonly known as: DRISDOL ?Take 50,000 Units by mouth once a week. ?  ? ?  ? ? ?Allergies:  ?Allergies  ?Allergen Reactions  ? Iohexol Other (See Comments)  ?   Desc: "shuts kidneys down" ?  ? Morphine And Related Shortness Of Breath  ? Hydrocodone Nausea And Vomiting  ? Sulfa Antibiotics Other (See Comments)  ?  UNKNOWN REACTION  ? Sulfonamide Derivatives Other (See Comments)  ?  UNKNOWN REACTION  ? Tramadol Rash  ?  Trazodone Rash and Other (See Comments)  ?  Pain  ? Trazodone And Nefazodone Rash  ?  Pain  ? ? ?Past Medical History, Surgical history, Social history, and Family History were reviewed and updated. ? ?Review of Systems: ?All other 10 point review of systems is negative.  ? ?Physical Exam: ? height is 6' (1.829 m) and weight is 243 lb 12.8 oz (110.6 kg). His oral temperature is 98 ?F (36.7 ?C). His blood pressure is 146/74 (abnormal) and his pulse is 63. His respiration is 18 and oxygen saturation is 100%.  ? ?Wt Readings from Last 3 Encounters:  ?11/24/21 243 lb 12.8 oz (110.6 kg)  ?10/13/21 249 lb 12.8 oz (113.3 kg)  ?09/01/21 241 lb 12.8 oz (109.7 kg)  ? ? ?Ocular: Sclerae unicteric, pupils equal, round and reactive to light ?Ear-nose-throat: Oropharynx clear, dentition fair ?Lymphatic: No cervical or supraclavicular adenopathy ?Lungs no rales or rhonchi, good excursion bilaterally ?Heart regular rate and rhythm, no murmur appreciated ?Abd soft, nontender, positive bowel sounds ?MSK no focal spinal tenderness, no joint edema ?Neuro: non-focal, well-oriented, appropriate affect ?Breasts: Deferred  ? ?Lab Results  ?Component Value Date  ? WBC 6.3 11/24/2021  ? HGB 10.7 (L) 11/24/2021  ? HCT 31.4 (L) 11/24/2021  ? MCV 88.2 11/24/2021  ? PLT 100 (L) 11/24/2021  ? ?Lab Results  ?Component Value Date  ? FERRITIN 90 10/13/2021  ? IRON 56 10/13/2021  ? TIBC 325 10/13/2021  ? UIBC 269 10/13/2021  ? IRONPCTSAT 17 (L) 10/13/2021  ? ?Lab Results  ?Component Value Date  ? RETICCTPCT 4.7 (H) 11/24/2021  ? RBC 3.58 (L) 11/24/2021  ? ?Lab Results  ?Component Value Date  ? KPAFRELGTCHN 49.7 (H) 02/17/2020  ? LAMBDASER 40.1 (H) 02/17/2020  ? KAPLAMBRATIO 6.90 04/30/2020  ? ?Lab Results  ?Component Value Date  ? IGGSERUM 1,267 04/29/2020  ? IGA 231 04/29/2020  ? IGMSERUM 36 04/29/2020  ? ?Lab Results  ?Component Value Date  ? TOTALPROTELP 6.5 04/29/2020  ? ALBUMINELP 2.9 02/17/2020  ? A1GS 0.4 02/17/2020  ? A2GS 0.7 02/17/2020  ?  BETS 0.8 02/17/2020  ? GAMS 0.7 02/17/2020  ? MSPIKE 0.3 (H) 02/17/2020  ? SPEI Comment 02/17/2020  ? ?  Chemistry   ?   ?Component Value Date/Time  ? NA 142 11/03/2021 1255  ? K 4.6 11/03/2021 1255  ? CL 106 11/03/2021 1255  ? CO2 27 11/03/2021 1255  ? BUN 36 (H) 11/03/2021 1255  ? CREATININE 1.94 (H) 11/03/2021 1255  ?    ?Component Value Date/Time  ? CALCIUM 9.0 11/03/2021 1255  ? ALKPHOS 96 11/03/2021 1255  ? AST 12 (L) 11/03/2021 1255  ? ALT 8  11/03/2021 1255  ? BILITOT 2.5 (H) 11/03/2021 1255  ?  ? ? ? ?Impression and Plan: Mr. Shelnutt is a very pleasant 81 yo caucasian gentleman with myelodysplasia -- refractory anemia with ringed sideroblast.  ?He will receive Luspatercept and ESA today, Hgb 10.7.  ?Iron studies are pending. Lab check with injection every 3 weeks, follow-up in 9 weeks.  ? ?Lottie Dawson, NP ?3/6/20231:30 PM ? ?

## 2021-11-24 NOTE — Patient Instructions (Signed)
Luspatercept Injection What is this medication? LUSPATERCEPT (lus PAT er sept) treats low levels of red blood cells (anemia) in the body in people with beta thalassemia or myelodysplastic syndromes. It works by helping the body make more red blood cells. This medicine may be used for other purposes; ask your health care provider or pharmacist if you have questions. COMMON BRAND NAME(S): REBLOZYL What should I tell my care team before I take this medication? They need to know if you have any of these conditions: Have had your spleen removed High blood pressure History of blood clots Tobacco use An unusual or allergic reaction to luspatercept, other medications, foods, dyes or preservatives Pregnant or trying to get pregnant Breast-feeding How should I use this medication? This medication is for injection under the skin. It is given by your care team in a hospital or clinic setting. Talk to your care team about the use of the medication in children. This medication is not approved for use in children. Overdosage: If you think you have taken too much of this medicine contact a poison control center or emergency room at once. NOTE: This medicine is only for you. Do not share this medicine with others. What if I miss a dose? Keep appointments for follow-up doses. It is important not to miss your dose. Call your care team if you are unable to keep an appointment. What may interact with this medication? Interactions are not expected. This list may not describe all possible interactions. Give your health care provider a list of all the medicines, herbs, non-prescription drugs, or dietary supplements you use. Also tell them if you smoke, drink alcohol, or use illegal drugs. Some items may interact with your medicine. What should I watch for while using this medication? Your condition will be monitored carefully while you are receiving this medication. Talk to your care team if you wish to become  pregnant or think you might be pregnant. This medication can cause serious birth defects. Discuss contraceptive options with your care team. Do not breastfeed while taking this medication. You may need blood work done while you are taking this medication. What side effects may I notice from receiving this medication? Side effects that you should report to your care team as soon as possible: Allergic reactions--skin rash, itching, hives, swelling of the face, lips, tongue, or throat Blood clot--pain, swelling, or warmth in the leg, shortness of breath, chest pain Increase in blood pressure Severe back pain, numbness or weakness of the hands, arms, legs, or feet, loss of coordination, loss of bowel or bladder control Side effects that usually do not require medical attention (report these to your care team if they continue or are bothersome): Bone pain Dizziness Fatigue Headache Joint pain Muscle pain Stomach pain This list may not describe all possible side effects. Call your doctor for medical advice about side effects. You may report side effects to FDA at 1-800-FDA-1088. Where should I keep my medication? This medication is given in a hospital or clinic. It will not be stored at home. NOTE: This sheet is a summary. It may not cover all possible information. If you have questions about this medicine, talk to your doctor, pharmacist, or health care provider.  2022 Elsevier/Gold Standard (2021-04-04 00:00:00) Epoetin Alfa injection What is this medication? EPOETIN ALFA (e POE e tin AL fa) helps your body make more red blood cells. This medicine is used to treat anemia caused by chronic kidney disease, cancer chemotherapy, or HIV-therapy. It may also be  used before surgery if you have anemia. This medicine may be used for other purposes; ask your health care provider or pharmacist if you have questions. COMMON BRAND NAME(S): Epogen, Procrit, Retacrit What should I tell my care team before I  take this medication? They need to know if you have any of these conditions: cancer heart disease high blood pressure history of blood clots history of stroke low levels of folate, iron, or vitamin B12 in the blood seizures an unusual or allergic reaction to erythropoietin, albumin, benzyl alcohol, hamster proteins, other medicines, foods, dyes, or preservatives pregnant or trying to get pregnant breast-feeding How should I use this medication? This medicine is for injection into a vein or under the skin. It is usually given by a health care professional in a hospital or clinic setting. If you get this medicine at home, you will be taught how to prepare and give this medicine. Use exactly as directed. Take your medicine at regular intervals. Do not take your medicine more often than directed. It is important that you put your used needles and syringes in a special sharps container. Do not put them in a trash can. If you do not have a sharps container, call your pharmacist or healthcare provider to get one. A special MedGuide will be given to you by the pharmacist with each prescription and refill. Be sure to read this information carefully each time. Talk to your pediatrician regarding the use of this medicine in children. While this drug may be prescribed for selected conditions, precautions do apply. Overdosage: If you think you have taken too much of this medicine contact a poison control center or emergency room at once. NOTE: This medicine is only for you. Do not share this medicine with others. What if I miss a dose? If you miss a dose, take it as soon as you can. If it is almost time for your next dose, take only that dose. Do not take double or extra doses. What may interact with this medication? Interactions have not been studied. This list may not describe all possible interactions. Give your health care provider a list of all the medicines, herbs, non-prescription drugs, or dietary  supplements you use. Also tell them if you smoke, drink alcohol, or use illegal drugs. Some items may interact with your medicine. What should I watch for while using this medication? Your condition will be monitored carefully while you are receiving this medicine. You may need blood work done while you are taking this medicine. This medicine may cause a decrease in vitamin B6. You should make sure that you get enough vitamin B6 while you are taking this medicine. Discuss the foods you eat and the vitamins you take with your health care professional. What side effects may I notice from receiving this medication? Side effects that you should report to your doctor or health care professional as soon as possible: allergic reactions like skin rash, itching or hives, swelling of the face, lips, or tongue seizures signs and symptoms of a blood clot such as breathing problems; changes in vision; chest pain; severe, sudden headache; pain, swelling, warmth in the leg; trouble speaking; sudden numbness or weakness of the face, arm or leg signs and symptoms of a stroke like changes in vision; confusion; trouble speaking or understanding; severe headaches; sudden numbness or weakness of the face, arm or leg; trouble walking; dizziness; loss of balance or coordination Side effects that usually do not require medical attention (report to your doctor or  health care professional if they continue or are bothersome): chills cough dizziness fever headaches joint pain muscle cramps muscle pain nausea, vomiting pain, redness, or irritation at site where injected This list may not describe all possible side effects. Call your doctor for medical advice about side effects. You may report side effects to FDA at 1-800-FDA-1088. Where should I keep my medication? Keep out of the reach of children. Store in a refrigerator between 2 and 8 degrees C (36 and 46 degrees F). Do not freeze or shake. Throw away any unused  portion if using a single-dose vial. Multi-dose vials can be kept in the refrigerator for up to 21 days after the initial dose. Throw away unused medicine. NOTE: This sheet is a summary. It may not cover all possible information. If you have questions about this medicine, talk to your doctor, pharmacist, or health care provider.  2022 Elsevier/Gold Standard (2017-05-11 00:00:00)

## 2021-11-25 LAB — FERRITIN: Ferritin: 80 ng/mL (ref 24–336)

## 2021-11-28 ENCOUNTER — Telehealth: Payer: Self-pay | Admitting: *Deleted

## 2021-11-28 NOTE — Telephone Encounter (Signed)
Per 11/24/21 los - called and gave upcoming appointments - confirmed ?

## 2021-12-01 ENCOUNTER — Ambulatory Visit: Payer: Medicare Other | Admitting: Cardiology

## 2021-12-15 ENCOUNTER — Inpatient Hospital Stay: Payer: Medicare Other

## 2021-12-15 ENCOUNTER — Other Ambulatory Visit: Payer: Self-pay

## 2021-12-15 DIAGNOSIS — D641 Secondary sideroblastic anemia due to disease: Secondary | ICD-10-CM | POA: Diagnosis not present

## 2021-12-15 DIAGNOSIS — D4620 Refractory anemia with excess of blasts, unspecified: Secondary | ICD-10-CM

## 2021-12-15 DIAGNOSIS — D461 Refractory anemia with ring sideroblasts: Secondary | ICD-10-CM

## 2021-12-15 LAB — CMP (CANCER CENTER ONLY)
ALT: 22 U/L (ref 0–44)
AST: 15 U/L (ref 15–41)
Albumin: 4.2 g/dL (ref 3.5–5.0)
Alkaline Phosphatase: 85 U/L (ref 38–126)
Anion gap: 6 (ref 5–15)
BUN: 60 mg/dL — ABNORMAL HIGH (ref 8–23)
CO2: 25 mmol/L (ref 22–32)
Calcium: 9.8 mg/dL (ref 8.9–10.3)
Chloride: 108 mmol/L (ref 98–111)
Creatinine: 1.91 mg/dL — ABNORMAL HIGH (ref 0.61–1.24)
GFR, Estimated: 35 mL/min — ABNORMAL LOW (ref >60)
Glucose, Bld: 208 mg/dL — ABNORMAL HIGH (ref 70–99)
Potassium: 5.3 mmol/L — ABNORMAL HIGH (ref 3.5–5.1)
Sodium: 139 mmol/L (ref 135–145)
Total Bilirubin: 2.3 mg/dL — ABNORMAL HIGH (ref 0.3–1.2)
Total Protein: 6.7 g/dL (ref 6.5–8.1)

## 2021-12-15 LAB — CBC WITH DIFFERENTIAL (CANCER CENTER ONLY)
Abs Immature Granulocytes: 0.04 10*3/uL (ref 0.00–0.07)
Basophils Absolute: 0 10*3/uL (ref 0.0–0.1)
Basophils Relative: 1 %
Eosinophils Absolute: 0.1 10*3/uL (ref 0.0–0.5)
Eosinophils Relative: 2 %
HCT: 37.3 % — ABNORMAL LOW (ref 39.0–52.0)
Hemoglobin: 12.6 g/dL — ABNORMAL LOW (ref 13.0–17.0)
Immature Granulocytes: 1 %
Lymphocytes Relative: 23 %
Lymphs Abs: 1.4 10*3/uL (ref 0.7–4.0)
MCH: 31 pg (ref 26.0–34.0)
MCHC: 33.8 g/dL (ref 30.0–36.0)
MCV: 91.9 fL (ref 80.0–100.0)
Monocytes Absolute: 0.4 10*3/uL (ref 0.1–1.0)
Monocytes Relative: 7 %
Neutro Abs: 4.1 10*3/uL (ref 1.7–7.7)
Neutrophils Relative %: 66 %
Platelet Count: 104 10*3/uL — ABNORMAL LOW (ref 150–400)
RBC: 4.06 MIL/uL — ABNORMAL LOW (ref 4.22–5.81)
RDW: 17.8 % — ABNORMAL HIGH (ref 11.5–15.5)
WBC Count: 6.1 10*3/uL (ref 4.0–10.5)
nRBC: 0 % (ref 0.0–0.2)

## 2021-12-15 NOTE — Progress Notes (Signed)
Reviewed labs with patient who is here for retacrit injection. Pt does not meet criteria to receive injection. Pt stated he felt good and was happy to "not have to get stuck today" Pt aware of next appointment. Pt given copy of labs and left waiting room ambulatory with wife. Pt aware to call clinic with any questions or concerns. Pt verbalized understanding and had no further questions. ?

## 2021-12-17 ENCOUNTER — Encounter (HOSPITAL_BASED_OUTPATIENT_CLINIC_OR_DEPARTMENT_OTHER): Payer: Self-pay | Admitting: Emergency Medicine

## 2021-12-17 ENCOUNTER — Encounter: Payer: Self-pay | Admitting: Hematology and Oncology

## 2021-12-17 ENCOUNTER — Encounter: Payer: Self-pay | Admitting: Hematology & Oncology

## 2021-12-17 ENCOUNTER — Emergency Department (HOSPITAL_BASED_OUTPATIENT_CLINIC_OR_DEPARTMENT_OTHER)
Admission: EM | Admit: 2021-12-17 | Discharge: 2021-12-17 | Disposition: A | Discharge status: Home / Self Care | Payer: Medicare Other | Attending: Emergency Medicine | Admitting: Emergency Medicine

## 2021-12-17 DIAGNOSIS — X58XXXA Exposure to other specified factors, initial encounter: Secondary | ICD-10-CM | POA: Insufficient documentation

## 2021-12-17 DIAGNOSIS — S8992XA Unspecified injury of left lower leg, initial encounter: Secondary | ICD-10-CM | POA: Diagnosis present

## 2021-12-17 DIAGNOSIS — Z7901 Long term (current) use of anticoagulants: Secondary | ICD-10-CM | POA: Diagnosis not present

## 2021-12-17 DIAGNOSIS — S81812A Laceration without foreign body, left lower leg, initial encounter: Secondary | ICD-10-CM | POA: Insufficient documentation

## 2021-12-17 MED ORDER — DOXYCYCLINE HYCLATE 100 MG PO CAPS
100.0000 mg | ORAL_CAPSULE | Freq: Two times a day (BID) | ORAL | 0 refills | Status: DC
Start: 2021-12-17 — End: 2022-01-20

## 2021-12-17 MED ORDER — DOXYCYCLINE HYCLATE 100 MG PO TABS
100.0000 mg | ORAL_TABLET | Freq: Once | ORAL | Status: AC
Start: 2021-12-17 — End: 2021-12-17
  Administered 2021-12-17: 100 mg via ORAL
  Filled 2021-12-17: qty 1

## 2021-12-17 NOTE — ED Provider Notes (Signed)
?[facility] EM[facility]PART[facility] ?Provider Note ? ? ?CSN: [id number] ?Arrival date & time: 12/17/21  1625 ? ?  ? ?History ? ?Chief Complaint  ?Patient presents with  ? Wound Check  ? ? ?Eric [name] is a 81 y.o. male history of wound check.  Patient has history of MDS.  Patient just had lab work checked 2 days ago that was unremarkable.  Patient had excellent get good toolbox several days ago and the wife put a bandaid.  She states that the area was healing well but when she took off the Band-Aid a piece of the skin came off as well.  She states that the area became more red and swollen.  Denies any fevers ? ?The history is provided by the patient.  ? ?  ? ?Home Medications ?Prior to Admission medications   ?Medication Sig Start Date End Date Taking? Authorizing Provider  ?acetaminophen (TYLENOL) 325 MG tablet Take 650 mg by mouth every 6 (six) hours as needed for moderate pain.  ?Patient not taking: Reported on 11/24/2021    [provider]  ?acetaminophen (TYLENOL) 500 MG tablet Take by mouth. ?Patient not taking: Reported on 11/24/2021    [provider]  ?allopurinol (ZYLOPRIM) 100 MG tablet Take 100 mg by mouth daily. 05/15/20   [provider]  ?ELIQUIS 5 MG TABS tablet Take 1 tablet (5 mg total) by mouth 2 (two) times daily. 02/10/21   Richardo Priest, MD  ?insulin glargine, 1 Unit Dial, (TOUJEO SOLOSTAR) 300 UNIT/ML Solostar Pen Inject 46 Units into the skin daily. Sliding scale 08/31/15   [provider]  ?insulin lispro (HUMALOG) 100 UNIT/ML KwikPen Inject 20 Units into the skin 3 (three) times daily. 04/15/15   [provider]  ?levothyroxine (SYNTHROID) 100 MCG tablet Take 50-100 mcg by mouth See admin instructions. Takes 100 mg daily except Sundays, on Sunday takes 50 mg 01/14/20   [provider]  ?levothyroxine (SYNTHROID) 100 MCG tablet Take 1 tablet by mouth daily. 11/19/21   [provider]  ?Jonetta Speak Lancets 78L MISC 4 (four)  times daily. 10/17/20   [provider]  ?silodosin (RAPAFLO) 8 MG CAPS capsule Take 8 mg by mouth daily.  12/13/19   [provider]  ?simvastatin (ZOCOR) 20 MG tablet Take 1 tablet (20 mg total) by mouth at bedtime. 11/18/21   Richardo Priest, MD  ?spironolactone (ALDACTONE) 50 MG tablet Take 1 tablet (50 mg total) by mouth daily. 11/18/21   Richardo Priest, MD  ?torsemide (DEMADEX) 20 MG tablet TAKE 1 TABLET BY MOUTH IN  THE MORNING AND AT BEDTIME 09/18/21   Richardo Priest, MD  ?triamcinolone cream (KENALOG) 0.1 % Apply 1 application. topically 2 (two) times daily. ?Patient not taking: Reported on 11/24/2021 10/01/21   [provider]  ?Vitamin D, Ergocalciferol, (DRISDOL) 1.25 MG (50000 UNIT) CAPS capsule Take 50,000 Units by mouth once a week. ?Patient not taking: Reported on 11/24/2021 07/07/21   [provider]  ?traZODone (DESYREL) 50 MG tablet  03/14/20 04/19/20  [provider]  ?   ? ?Allergies    ?Iohexol, Morphine and related, Hydrocodone, Sulfa antibiotics, Sulfonamide derivatives, Tramadol, Trazodone, and Trazodone and nefazodone   ? ?Review of Systems   ?Review of Systems  ?Skin:  Positive for wound.  ?All other systems reviewed and are negative. ? ?Physical Exam ?Updated Vital Signs ?BP (!) 162/74   Pulse 64   Temp 97.9 ?F (36.6 ?C) (Oral)   Resp 20  Ht 6' (1.829 m)   Wt 103 kg   SpO2 99%   BMI 30.79 kg/m?  ?Physical Exam ?Vitals and nursing note reviewed.  ?Constitutional:   ?   Comments: Chronically ill  ?HENT:  ?   Head: Normocephalic.  ?   Mouth/Throat:  ?   Mouth: Mucous membranes are moist.  ?Eyes:  ?   Extraocular Movements: Extraocular movements intact.  ?   Pupils: Pupils are equal, round, and reactive to light.  ?Cardiovascular:  ?   Rate and Rhythm: Normal rate and regular rhythm.  ?   Pulses: Normal pulses.  ?   Heart sounds: Normal heart sounds.  ?Pulmonary:  ?   Effort: Pulmonary effort is normal.  ?Abdominal:  ?   General: Abdomen is flat.   ?Musculoskeletal:  ?   Cervical back: Normal range of motion and neck supple.  ?   Comments: Patient has an area of 5 x 3 cm skin tear.  There is surrounding erythema but no obvious purulent drainage.  There is a healing scar lateral to that area.  Neurovascularly intact in the left lower extremity  ?Neurological:  ?   General: No focal deficit present.  ?   Mental Status: He is oriented to person, place, and time.  ?Psychiatric:     ?   Mood and Affect: Mood normal.     ?   Behavior: Behavior normal.  ? ? ? ?ED Results / Procedures / Treatments   ?Labs ?(all labs ordered are listed, but only abnormal results are displayed) ?Labs Reviewed - No data to display ? ?EKG ?None ? ?Radiology ?No results found. ? ?Procedures ?Procedures  ? ? ?Medications Ordered in ED ?Medications  ?doxycycline (VIBRA-TABS) tablet 100 mg (has no administration in time range)  ? ? ?ED Course/ Medical Decision Making/ A&P ?  ?                        ?Medical Decision Making ?[name] is a 81 y.o. male here presenting with skin tear in the left lower extremity.  Patient has some swelling and redness around it.  He is on Eliquis.  His labs 2 days ago showed creatinine of 1.9 which is baseline.  His hemoglobin and white blood cell count are stable.  I do not think he is septic.  We will give doxycycline empirically.  We will have him follow-up with his doctor ? ? ?Problems Addressed: ?Skin tear of left lower leg without complication, initial encounter: acute illness or injury ? ?Amount and/or Complexity of Data Reviewed ?External Data Reviewed: labs and notes. ? ?Risk ?Prescription drug management. ? ?ision making why or why not admission, treatments were needed:1} ?Final Clinical Impression(s) / ED Diagnoses ?Final diagnoses:  ?None  ? ? ?Rx / DC Orders ?ED Discharge Orders   ? ? None  ? ?  ? ? ?  ?Drenda Freeze, MD ?12/17/21 1911 ? ?

## 2021-12-17 NOTE — ED Triage Notes (Signed)
Pt reports wound to LLE that is not healing; some skin was removed with a bandaid and it just keeps getting larger ?

## 2021-12-17 NOTE — Discharge Instructions (Signed)
You have a skin tear.  Please use nonstick dressing and do not put any tape on the skin ? ?Take doxycycline twice daily for a week ? ?See your doctor for follow up ? ?Return to ER if you have worse drainage, redness or pain ?

## 2021-12-17 NOTE — ED Notes (Signed)
LLE skin tear wound cleaned and dressed. Pt/family educated on how to perform wound care at home. Pt/family verbalized understanding. All questions/concerns addressed at time of dc. Extra wound care supplies given. Pt ambulatory to ED exit.  ?

## 2022-01-05 ENCOUNTER — Inpatient Hospital Stay: Payer: Medicare Other | Attending: Hematology & Oncology

## 2022-01-05 ENCOUNTER — Inpatient Hospital Stay: Payer: Medicare Other

## 2022-01-05 DIAGNOSIS — D461 Refractory anemia with ring sideroblasts: Secondary | ICD-10-CM | POA: Insufficient documentation

## 2022-01-05 DIAGNOSIS — D4620 Refractory anemia with excess of blasts, unspecified: Secondary | ICD-10-CM

## 2022-01-05 LAB — CBC WITH DIFFERENTIAL (CANCER CENTER ONLY)
Abs Immature Granulocytes: 0.03 10*3/uL (ref 0.00–0.07)
Basophils Absolute: 0 10*3/uL (ref 0.0–0.1)
Basophils Relative: 1 %
Eosinophils Absolute: 0.1 10*3/uL (ref 0.0–0.5)
Eosinophils Relative: 1 %
HCT: 35 % — ABNORMAL LOW (ref 39.0–52.0)
Hemoglobin: 11.8 g/dL — ABNORMAL LOW (ref 13.0–17.0)
Immature Granulocytes: 1 %
Lymphocytes Relative: 21 %
Lymphs Abs: 1.3 10*3/uL (ref 0.7–4.0)
MCH: 30.7 pg (ref 26.0–34.0)
MCHC: 33.7 g/dL (ref 30.0–36.0)
MCV: 91.1 fL (ref 80.0–100.0)
Monocytes Absolute: 0.5 10*3/uL (ref 0.1–1.0)
Monocytes Relative: 8 %
Neutro Abs: 4.3 10*3/uL (ref 1.7–7.7)
Neutrophils Relative %: 68 %
Platelet Count: 98 10*3/uL — ABNORMAL LOW (ref 150–400)
RBC: 3.84 MIL/uL — ABNORMAL LOW (ref 4.22–5.81)
RDW: 15.8 % — ABNORMAL HIGH (ref 11.5–15.5)
WBC Count: 6.2 10*3/uL (ref 4.0–10.5)
nRBC: 0 % (ref 0.0–0.2)

## 2022-01-05 LAB — CMP (CANCER CENTER ONLY)
ALT: 19 U/L (ref 0–44)
AST: 16 U/L (ref 15–41)
Albumin: 3.8 g/dL (ref 3.5–5.0)
Alkaline Phosphatase: 77 U/L (ref 38–126)
Anion gap: 8 (ref 5–15)
BUN: 49 mg/dL — ABNORMAL HIGH (ref 8–23)
CO2: 23 mmol/L (ref 22–32)
Calcium: 9 mg/dL (ref 8.9–10.3)
Chloride: 111 mmol/L (ref 98–111)
Creatinine: 1.83 mg/dL — ABNORMAL HIGH (ref 0.61–1.24)
GFR, Estimated: 37 mL/min — ABNORMAL LOW (ref >60)
Glucose, Bld: 128 mg/dL — ABNORMAL HIGH (ref 70–99)
Potassium: 5.1 mmol/L (ref 3.5–5.1)
Sodium: 142 mmol/L (ref 135–145)
Total Bilirubin: 2.9 mg/dL — ABNORMAL HIGH (ref 0.3–1.2)
Total Protein: 6.3 g/dL — ABNORMAL LOW (ref 6.5–8.1)

## 2022-01-05 NOTE — Progress Notes (Signed)
Per Dr. Marin Olp, he does not need Retacrit or Luspatercept today. Patient verbalized understanding. ?

## 2022-01-19 NOTE — Progress Notes (Signed)
?Cardiology Office Note:   ? ?Date:  01/20/2022  ? ?ID:  Eric [name], DOB 08-Apr-1941, MRN [id number] ? ?PCP:  Forrestine Him, PA-C  ?Cardiologist:  Shirlee More, MD   ? ?Referring MD: Forrestine Him, PA-C  ? ? ?ASSESSMENT:   ? ?1. Persistent atrial fibrillation ([name])   ?2. Chronic anticoagulation   ?3. Hypertensive heart disease with chronic diastolic congestive heart failure ([name])   ?4. CKD (chronic kidney disease) stage 4, GFR 15-29 ml/min ([name])   ? ?PLAN:   ? ?In order of problems listed above: ? ?His heart failure is decompensated and despite his CKD he will take an extra diuretic 3 days a week and I think he will purchase a 10 pound weight loss and improve his shortness of breath.  He has frequent labs performed to follow his CKD through the cancer program ?Stable rate controlled without suppressive medication continue his anticoagulant ?Stable CKD ? ? ?Next appointment: 6 months ? ? ?Medication Adjustments/Labs and Tests Ordered: ?Current medicines are reviewed at length with the patient today.  Concerns regarding medicines are outlined above.  ?No orders of the defined types were placed in this encounter. ? ?No orders of the defined types were placed in this encounter. ? ? ?Chief Complaint  ?Patient presents with  ? Follow-up  ? Congestive Heart Failure  ? ? ?History of Present Illness:   ? ?Eric [name] is a 81 y.o. 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 ? ?Compliance with diet, lifestyle and medications: Yes ? ?Overall doing well but he is a little bit more short of breath with activity increased edema and at least from the scale in my office his weight is up somewhere in the range of 10 pounds ?No orthopnea chest pain palpitation or syncope ? ?Recent labs 2 weeks ago  creatinine 1.83 GFR stable at 37 cc hemoglobin 11.8. ? ?Echocardiogram May 2021 shows ejection fraction 60 to 65% normal right ventricular function mild dilation severe left atrial enlargement mild right atrial enlargement mild aortic stenosis mean gradient of 18 mmHg. ?Past Medical History:  ?Diagnosis Date  ? Abnormal results of liver function studies 04/24/2014  ? Acquired hypothyroidism 04/24/2014  ? Acute on chronic diastolic heart failure ([place]) 02/12/2020  ? 5/24-5/31/2021 acute on chronic heart failure in the context of chronic venous insufficiency and acute on CKD.  IV diuresis resulted in output of 6.9 L.  ? AKI (acute kidney injury) ([place]) 06/29/2019  ? Anemia   ? Atrial fibrillation ([name]) 01/15/2009  ? Qualifier: Diagnosis of  By: Lynnell Jude RN BSN, [employer] of this note might be different from the original. Overview:  Qualifier: Diagnosis of  By: Lynnell Jude RN BSN, [employer] of this note might be different from the original. Chronic on Pradaxa  ? B12 deficiency anemia 09/19/2014  ? Benign essential hypertension 02/06/2009  ? Qualifier: Diagnosis of  By: Hollie Salk CMA, Estill Bamberg    ? Benign hypertension with CKD (chronic kidney disease) stage IV ([name]) 06/28/2018  ? Bilateral edema of lower extremity 04/06/2018  ? Bilateral lower extremity edema   ? 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 Bulging lumbar disc 04/24/2014  ? Cellulitis of arm 03/21/2014  ? Change in stool 05/26/2018  ? CHF (congestive heart failure) ([name]) 04/24/2014  ? Formatting of this note might be  different from the original. Overview:  EF 50-55%  Overview:  EF 50-55% Formatting of this note might be different from the original. EF 50-55%  ? Chronic atrial fibrillation ([name])   ? Chronic coronary artery disease 04/24/2014  ? Formatting of this note might be different from the original. Overview:  Cath 2003 . Moderate - LAD 60% Formatting of this note might be different from the original. Cath 2003 [facility]. Moderate -  LAD 60%  ? Chronic kidney disease, stage 3b ([name]) 06/28/2018  ? Colon polyp 07/08/2018  ? Current use of long term anticoagulation 10/12/2018  ? Degenerative arthritis of knee, bilateral 06/06/2014  ? Diabetes with neurologic complications ([name]) 44/31/5400  ? Diabetes, polyneuropathy ([name]) 04/24/2014  ? Diastolic heart failure ([name])   ? DIASTOLIC HEART FAILURE, CHRONIC 02/07/2009  ? Qualifier: Diagnosis of  By: Caryl Comes, MD, [name], Mack Guise   Formatting of this note might be different from the original. Overview:  Overview:  Qualifier: Diagnosis of  By: Caryl Comes, MD, [facility], Mack Guise Formatting of this note might be different from the original. Overview:  Qualifier: Diagnosis of  By: Caryl Comes, MD, [name]  ? Disorder of kidney and ureter, unspecified 04/24/2014  ? Dry skin 08/23/2018  ? Elevated liver enzymes 07/09/2018  ? Gall bladder disease 2020  ? Gallstones 04/24/2014  ? Goals of care, counseling/discussion 05/29/2020  ? Gout 04/24/2014  ? Hearing loss 04/24/2014  ? Hip pain 06/06/2014  ? History of colon polyps 06/01/2018  ? History of right hip replacement   ? Pt stated 8-9 yrs ago  ? Hyperkalemia 07/13/2018  ? 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  ? Influenza A 11/01/2017  ? Internal hemorrhoids 07/08/2018  ? Low grade myelodysplastic syndrome lesions ([name]) 05/29/2020  ? Nasal vestibulitis 04/24/2014  ? Neurocognitive deficits 02/20/2020  ? SLUMS is a mental status assessment of possible dementia published by the [place]. The test differentiates between high school or less education level in reference to presence or absence of dementia. For high school education a score of 27-30 is normal, 21-26 is minimal neurocognitive deficit and 1-20 suggests dementia. With less than a high school education similar sco  ? Obesity 02/06/2009  ? Qualifier: Diagnosis of  By: Hollie Salk CMA, Monika Salk of this note might be different from the  original. Overview:  Qualifier: Diagnosis of  By: Hollie Salk CMA, Estill Bamberg  ? Osteoarthritis of hip, unspecified 04/24/2014  ? Other and unspecified hyperlipidemia 04/24/2014  ? Pain in joint, pelvic region and thigh 04/24/2014  ? 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 Primary insomnia 12/19/2014  ? Primary localized osteoarthrosis, pelvic region and thigh 02/25/2013  ? Proteinuria 06/28/2018  ? Refractory anemia with sideroblasts ([place]) 05/29/2020  ? Sacroiliitis ([place]) 02/25/2013  ? 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[place]) 02/06/2009  ? Qualifier: Diagnosis of  By: Hollie Salk CMA, Monika Salk of this note might be different from the original. Formatting of this note might be different from the original. Qualifier: Diagnosis of  By: Hollie Salk CMA, Estill Bamberg   Last Assessment & Plan:  Formatting of this note might be different from the original. 02/12/2020 A1c 8.4% which is adequate control with age & advanced co-morbidities Glucos  ? Shingles   ? Skin lesion of left leg 03/21/2014  ? Tendinitis of right shoulder 07/18/2014  ? Thoracic or lumbosacral neuritis or radiculitis, unspecified 04/24/2014  ? Formatting of  this note might be different from the original. Possible right L5 or S1  ? Thrombocytopenia ([place]) 02/20/2020  ? Platelet count varied from 73,000 up to 102,000 as inpatient.  Hematology outpatient consultation on 02/29/2020.   Formatting of this note might be different from the original. Formatting of this note might be different from the original. Platelet count varied from 73,000 up to 102,000 as inpatient.  Hematology outpatient consultation on 02/29/2020.   Last Assessment & Plan:  Formatting of this note  ? Type 2 diabetes mellitus with stage 3b chronic kidney disease, with long-term current use of insulin ([place]) 02/25/2013  ? Venous insufficiency 02/19/2020  ? Formatting of this note might be different from the original. Last Assessment & Plan:  Formatting of  this note might be different from the original. Unna boots will be continued at the SNF under the supervision of the wound care nurse.  Outpatient follow-up clinic in the vein clinic will be arranged. It is antic

## 2022-01-20 ENCOUNTER — Ambulatory Visit (INDEPENDENT_AMBULATORY_CARE_PROVIDER_SITE_OTHER): Payer: Medicare Other | Admitting: Cardiology

## 2022-01-20 ENCOUNTER — Encounter: Payer: Self-pay | Admitting: Cardiology

## 2022-01-20 VITALS — BP 144/72 | HR 80 | Ht 72.0 in | Wt 252.0 lb

## 2022-01-20 DIAGNOSIS — I5032 Chronic diastolic (congestive) heart failure: Secondary | ICD-10-CM

## 2022-01-20 DIAGNOSIS — Z7901 Long term (current) use of anticoagulants: Secondary | ICD-10-CM

## 2022-01-20 DIAGNOSIS — I4819 Other persistent atrial fibrillation: Secondary | ICD-10-CM | POA: Diagnosis not present

## 2022-01-20 DIAGNOSIS — N184 Chronic kidney disease, stage 4 (severe): Secondary | ICD-10-CM

## 2022-01-20 DIAGNOSIS — I110 Hypertensive heart disease with heart failure: Secondary | ICD-10-CM

## 2022-01-20 MED ORDER — TORSEMIDE 20 MG PO TABS: 20.0000 mg | ORAL_TABLET | Freq: Two times a day (BID) | ORAL | 3 refills | Status: DC

## 2022-01-20 NOTE — Patient Instructions (Signed)
Medication Instructions:  ?Your physician has recommended you make the following change in your medication:  ? ?START: Torsemide 20 mg twice daily (only take a second pill on M-W-F) ? ?*If you need a refill on your cardiac medications before your next appointment, please call your pharmacy* ? ? ?Lab Work: ?None ?If you have labs (blood work) drawn today and your tests are completely normal, you will receive your results only by: ?MyChart Message (if you have MyChart) OR ?A paper copy in the mail ?If you have any lab test that is abnormal or we need to change your treatment, we will call you to review the results. ? ? ?Testing/Procedures: ?None ? ? ?Follow-Up: ?At [facility], you and your health needs are our priority.  As part of our continuing mission to provide you with exceptional heart care, we have created designated Provider Care Teams.  These Care Teams include your primary Cardiologist (physician) and Advanced Practice Providers (APPs -  Physician Assistants and Nurse Practitioners) who all work together to provide you with the care you need, when you need it. ? ?We recommend signing up for the patient portal called "MyChart".  Sign up information is provided on this After Visit Summary.  MyChart is used to connect with patients for Virtual Visits (Telemedicine).  Patients are able to view lab/test results, encounter notes, upcoming appointments, etc.  Non-urgent messages can be sent to your provider as well.   ?To learn more about what you can do with MyChart, go to [email or web address].   ? ?Your next appointment:   ?6 month(s) ? ?The format for your next appointment:   ?In Person ? ?Provider:   ?Shirlee More, MD  ? ? ?Other Instructions ?None ? ?Important Information About Sugar ? ? ? ? ? ? ?

## 2022-01-26 ENCOUNTER — Other Ambulatory Visit: Payer: Medicare Other

## 2022-01-26 ENCOUNTER — Ambulatory Visit: Payer: Medicare Other | Admitting: Family

## 2022-01-26 ENCOUNTER — Ambulatory Visit: Payer: Medicare Other

## 2022-01-27 ENCOUNTER — Inpatient Hospital Stay: Payer: Medicare Other

## 2022-01-27 ENCOUNTER — Encounter: Payer: Self-pay | Admitting: Family

## 2022-01-27 ENCOUNTER — Inpatient Hospital Stay (HOSPITAL_BASED_OUTPATIENT_CLINIC_OR_DEPARTMENT_OTHER): Payer: Medicare Other | Admitting: Family

## 2022-01-27 ENCOUNTER — Other Ambulatory Visit: Payer: Self-pay

## 2022-01-27 ENCOUNTER — Inpatient Hospital Stay: Payer: Medicare Other | Attending: Hematology & Oncology

## 2022-01-27 VITALS — BP 145/59 | HR 62 | Temp 97.9°F | Resp 18 | Ht 72.0 in | Wt 255.0 lb

## 2022-01-27 DIAGNOSIS — D461 Refractory anemia with ring sideroblasts: Secondary | ICD-10-CM | POA: Diagnosis present

## 2022-01-27 DIAGNOSIS — D518 Other vitamin B12 deficiency anemias: Secondary | ICD-10-CM

## 2022-01-27 DIAGNOSIS — D4620 Refractory anemia with excess of blasts, unspecified: Secondary | ICD-10-CM

## 2022-01-27 DIAGNOSIS — D509 Iron deficiency anemia, unspecified: Secondary | ICD-10-CM | POA: Diagnosis not present

## 2022-01-27 DIAGNOSIS — Z79899 Other long term (current) drug therapy: Secondary | ICD-10-CM | POA: Diagnosis not present

## 2022-01-27 LAB — CBC WITH DIFFERENTIAL (CANCER CENTER ONLY)
Abs Immature Granulocytes: 0.03 10*3/uL (ref 0.00–0.07)
Basophils Absolute: 0 10*3/uL (ref 0.0–0.1)
Basophils Relative: 1 %
Eosinophils Absolute: 0.1 10*3/uL (ref 0.0–0.5)
Eosinophils Relative: 2 %
HCT: 31.5 % — ABNORMAL LOW (ref 39.0–52.0)
Hemoglobin: 10.9 g/dL — ABNORMAL LOW (ref 13.0–17.0)
Immature Granulocytes: 1 %
Lymphocytes Relative: 18 %
Lymphs Abs: 0.9 10*3/uL (ref 0.7–4.0)
MCH: 31.6 pg (ref 26.0–34.0)
MCHC: 34.6 g/dL (ref 30.0–36.0)
MCV: 91.3 fL (ref 80.0–100.0)
Monocytes Absolute: 0.3 10*3/uL (ref 0.1–1.0)
Monocytes Relative: 6 %
Neutro Abs: 3.7 10*3/uL (ref 1.7–7.7)
Neutrophils Relative %: 72 %
Platelet Count: 89 10*3/uL — ABNORMAL LOW (ref 150–400)
RBC: 3.45 MIL/uL — ABNORMAL LOW (ref 4.22–5.81)
RDW: 15.9 % — ABNORMAL HIGH (ref 11.5–15.5)
WBC Count: 5.1 10*3/uL (ref 4.0–10.5)
nRBC: 0 % (ref 0.0–0.2)

## 2022-01-27 LAB — CMP (CANCER CENTER ONLY)
ALT: 17 U/L (ref 0–44)
AST: 14 U/L — ABNORMAL LOW (ref 15–41)
Albumin: 4.2 g/dL (ref 3.5–5.0)
Alkaline Phosphatase: 93 U/L (ref 38–126)
Anion gap: 8 (ref 5–15)
BUN: 50 mg/dL — ABNORMAL HIGH (ref 8–23)
CO2: 25 mmol/L (ref 22–32)
Calcium: 9.3 mg/dL (ref 8.9–10.3)
Chloride: 108 mmol/L (ref 98–111)
Creatinine: 1.87 mg/dL — ABNORMAL HIGH (ref 0.61–1.24)
GFR, Estimated: 36 mL/min — ABNORMAL LOW (ref >60)
Glucose, Bld: 142 mg/dL — ABNORMAL HIGH (ref 70–99)
Potassium: 4.4 mmol/L (ref 3.5–5.1)
Sodium: 141 mmol/L (ref 135–145)
Total Bilirubin: 2.9 mg/dL — ABNORMAL HIGH (ref 0.3–1.2)
Total Protein: 6.4 g/dL — ABNORMAL LOW (ref 6.5–8.1)

## 2022-01-27 LAB — FERRITIN: Ferritin: 111 ng/mL (ref 24–336)

## 2022-01-27 LAB — IRON AND IRON BINDING CAPACITY (CC-WL,HP ONLY)
Iron: 108 ug/dL (ref 45–182)
Saturation Ratios: 34 % (ref 17.9–39.5)
TIBC: 318 ug/dL (ref 250–450)
UIBC: 210 ug/dL (ref 117–376)

## 2022-01-27 LAB — RETICULOCYTES
Immature Retic Fract: 13.3 % (ref 2.3–15.9)
RBC.: 3.46 MIL/uL — ABNORMAL LOW (ref 4.22–5.81)
Retic Count, Absolute: 165 10*3/uL (ref 19.0–186.0)
Retic Ct Pct: 4.8 % — ABNORMAL HIGH (ref 0.4–3.1)

## 2022-01-27 MED ORDER — EPOETIN ALFA-EPBX 40000 UNIT/ML IJ SOLN
40000.0000 [IU] | Freq: Once | INTRAMUSCULAR | Status: AC
  Administered 2022-01-27: 40000 [IU] via SUBCUTANEOUS
  Filled 2022-01-27: qty 1

## 2022-01-27 MED ORDER — LUSPATERCEPT-AAMT 75 MG ~~LOC~~ SOLR
100.0000 mg | Freq: Once | SUBCUTANEOUS | Status: AC
  Administered 2022-01-27: 100 mg via SUBCUTANEOUS
  Filled 2022-01-27: qty 1.5

## 2022-01-27 NOTE — Patient Instructions (Addendum)
Luspatercept Injection ?What is this medication? ?LUSPATERCEPT (lus PAT er sept) treats low levels of red blood cells (anemia) in the body in people with beta thalassemia or myelodysplastic syndromes. It works by helping the body make more red blood cells. ?This medicine may be used for other purposes; ask your health care provider or pharmacist if you have questions. ?COMMON BRAND NAME(S): REBLOZYL ?What should I tell my care team before I take this medication? ?They need to know if you have any of these conditions: ?Have had your spleen removed ?High blood pressure ?History of blood clots ?Tobacco use ?An unusual or allergic reaction to luspatercept, other medications, foods, dyes or preservatives ?Pregnant or trying to get pregnant ?Breast-feeding ?How should I use this medication? ?This medication is for injection under the skin. It is given by your care team in a hospital or clinic setting. ?Talk to your care team about the use of the medication in children. This medication is not approved for use in children. ?Overdosage: If you think you have taken too much of this medicine contact a poison control center or emergency room at once. ?NOTE: This medicine is only for you. Do not share this medicine with others. ?What if I miss a dose? ?Keep appointments for follow-up doses. It is important not to miss your dose. Call your care team if you are unable to keep an appointment. ?What may interact with this medication? ?Interactions are not expected. ?This list may not describe all possible interactions. Give your health care provider a list of all the medicines, herbs, non-prescription drugs, or dietary supplements you use. Also tell them if you smoke, drink alcohol, or use illegal drugs. Some items may interact with your medicine. ?What should I watch for while using this medication? ?Your condition will be monitored carefully while you are receiving this medication. ?Talk to your care team if you wish to become  pregnant or think you might be pregnant. This medication can cause serious birth defects. Discuss contraceptive options with your care team. Do not breastfeed while taking this medication. ?You may need blood work done while you are taking this medication. ?What side effects may I notice from receiving this medication? ?Side effects that you should report to your care team as soon as possible: ?Allergic reactions--skin rash, itching, hives, swelling of the face, lips, tongue, or throat ?Blood clot--pain, swelling, or warmth in the leg, shortness of breath, chest pain ?Increase in blood pressure ?Severe back pain, numbness or weakness of the hands, arms, legs, or feet, loss of coordination, loss of bowel or bladder control ?Side effects that usually do not require medical attention (report these to your care team if they continue or are bothersome): ?Bone pain ?Dizziness ?Fatigue ?Headache ?Joint pain ?Muscle pain ?Stomach pain ?This list may not describe all possible side effects. Call your doctor for medical advice about side effects. You may report side effects to FDA at 1-800-FDA-1088. ?Where should I keep my medication? ?This medication is given in a hospital or clinic. It will not be stored at home. ?NOTE: This sheet is a summary. It may not cover all possible information. If you have questions about this medicine, talk to your doctor, pharmacist, or health care provider. ?? 2023 Elsevier/Gold Standard (2021-04-04 00:00:00) ?Epoetin Alfa injection ?What is this medication? ?EPOETIN ALFA (e POE e tin AL fa) helps your body make more red blood cells. This medicine is used to treat anemia caused by chronic kidney disease, cancer chemotherapy, or HIV-therapy. It may also be  used before surgery if you have anemia. ?This medicine may be used for other purposes; ask your health care provider or pharmacist if you have questions. ?COMMON BRAND NAME(S): Epogen, Procrit, Retacrit ?What should I tell my care team before I  take this medication? ?They need to know if you have any of these conditions: ?cancer ?heart disease ?high blood pressure ?history of blood clots ?history of stroke ?low levels of folate, iron, or vitamin B12 in the blood ?seizures ?an unusual or allergic reaction to erythropoietin, albumin, benzyl alcohol, hamster proteins, other medicines, foods, dyes, or preservatives ?pregnant or trying to get pregnant ?breast-feeding ?How should I use this medication? ?This medicine is for injection into a vein or under the skin. It is usually given by a health care professional in a hospital or clinic setting. ?If you get this medicine at home, you will be taught how to prepare and give this medicine. Use exactly as directed. Take your medicine at regular intervals. Do not take your medicine more often than directed. ?It is important that you put your used needles and syringes in a special sharps container. Do not put them in a trash can. If you do not have a sharps container, call your pharmacist or healthcare provider to get one. ?A special MedGuide will be given to you by the pharmacist with each prescription and refill. Be sure to read this information carefully each time. ?Talk to your pediatrician regarding the use of this medicine in children. While this drug may be prescribed for selected conditions, precautions do apply. ?Overdosage: If you think you have taken too much of this medicine contact a poison control center or emergency room at once. ?NOTE: This medicine is only for you. Do not share this medicine with others. ?What if I miss a dose? ?If you miss a dose, take it as soon as you can. If it is almost time for your next dose, take only that dose. Do not take double or extra doses. ?What may interact with this medication? ?Interactions have not been studied. ?This list may not describe all possible interactions. Give your health care provider a list of all the medicines, herbs, non-prescription drugs, or dietary  supplements you use. Also tell them if you smoke, drink alcohol, or use illegal drugs. Some items may interact with your medicine. ?What should I watch for while using this medication? ?Your condition will be monitored carefully while you are receiving this medicine. ?You may need blood work done while you are taking this medicine. ?This medicine may cause a decrease in vitamin B6. You should make sure that you get enough vitamin B6 while you are taking this medicine. Discuss the foods you eat and the vitamins you take with your health care professional. ?What side effects may I notice from receiving this medication? ?Side effects that you should report to your doctor or health care professional as soon as possible: ?allergic reactions like skin rash, itching or hives, swelling of the face, lips, or tongue ?seizures ?signs and symptoms of a blood clot such as breathing problems; changes in vision; chest pain; severe, sudden headache; pain, swelling, warmth in the leg; trouble speaking; sudden numbness or weakness of the face, arm or leg ?signs and symptoms of a stroke like changes in vision; confusion; trouble speaking or understanding; severe headaches; sudden numbness or weakness of the face, arm or leg; trouble walking; dizziness; loss of balance or coordination ?Side effects that usually do not require medical attention (report to your doctor or  health care professional if they continue or are bothersome): ?chills ?cough ?dizziness ?fever ?headaches ?joint pain ?muscle cramps ?muscle pain ?nausea, vomiting ?pain, redness, or irritation at site where injected ?This list may not describe all possible side effects. Call your doctor for medical advice about side effects. You may report side effects to FDA at 1-800-FDA-1088. ?Where should I keep my medication? ?Keep out of the reach of children. ?Store in a refrigerator between 2 and 8 degrees C (36 and 46 degrees F). Do not freeze or shake. Throw away any unused  portion if using a single-dose vial. Multi-dose vials can be kept in the refrigerator for up to 21 days after the initial dose. Throw away unused medicine. ?NOTE: This sheet is a summary. It may not cover

## 2022-01-27 NOTE — Progress Notes (Signed)
?Hematology and Oncology Follow Up Visit ? ?Finbar Pidcock ?[id number] ?06/12/1941 81 y.o. ?01/27/2022 ? ? 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  ?Current Therapy:        ?Luspatercept 1.0 mg/m2 sq q 4 week -- started on 07/18/2020 ?Retacrit 40,000 units sq for Hgb < 11 ?IV iron as indicated  ?  ?Interim History:  Mr. Kushnir is here today for follow-up and injections. Hgb today is 10.9.  ?He is doing well and has no complaints at this time.  ?SOB with exertion with COPD is unchanged from baseline.  ?No fever, chills, n/v, cough, rash, dizziness, chest pain, palpitations, abdominal pain or changes in bowel or bladder habits.  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  ?No falls or syncope reported.  ?He is ambulating with a cane for added support. ?Appetite good, hydrating well. His weight is stable at 255 lbs.  ? ?ECOG Performance Status: 1 - Symptomatic but completely ambulatory ? ?Medications:  ?Allergies as of 01/27/2022   ? ?   Reactions  ? Iohexol Other (See Comments)  ?  Desc: "shuts kidneys down"  ? Morphine And Related Shortness Of Breath  ? Hydrocodone Nausea And Vomiting  ? Sulfa Antibiotics Other (See Comments)  ? UNKNOWN REACTION  ? Sulfonamide Derivatives Other (See Comments)  ? UNKNOWN REACTION  ? Tramadol Rash  ? Trazodone Rash, Other (See Comments)  ? Pain  ? Trazodone And Nefazodone Rash  ? Pain  ? ?  ? ?  ?Medication List  ?  ? ?  ? Accurate as of Jan 27, 2022  1:24 PM. If you have any questions, ask your nurse or doctor.  ?  ?  ? ?  ? ?acetaminophen 325 MG tablet ?Commonly known as: TYLENOL ?Take 650 mg by mouth every 6 (six) hours as needed for moderate pain. ?  ?allopurinol 100 MG tablet ?Commonly known as: ZYLOPRIM ?Take 100 mg by mouth daily. ?  ?Eliquis 5 MG Tabs tablet ?Generic drug: apixaban ?Take 1 tablet (5 mg total) by mouth 2 (two) times daily. ?  ?insulin lispro 100 UNIT/ML KwikPen ?Commonly known as:  HUMALOG ?Inject 20 Units into the skin 3 (three) times daily. ?  ?levothyroxine 100 MCG tablet ?Commonly known as: SYNTHROID ?Take 50-100 mcg by mouth See admin instructions. Takes 100 mg daily except Sundays, on Sunday takes 50 mg ?  ?OneTouch Delica Lancets 01B Misc ?4 (four) times daily. ?  ?silodosin 8 MG Caps capsule ?Commonly known as: RAPAFLO ?Take 8 mg by mouth daily. ?  ?simvastatin 20 MG tablet ?Commonly known as: ZOCOR ?Take 1 tablet (20 mg total) by mouth at bedtime. ?  ?spironolactone 50 MG tablet ?Commonly known as: ALDACTONE ?Take 1 tablet (50 mg total) by mouth daily. ?  ?torsemide 20 MG tablet ?Commonly known as: DEMADEX ?Take 1 tablet (20 mg total) by mouth 2 (two) times daily. Please only take a second pill on M-W-F ?  ?Toujeo SoloStar 300 UNIT/ML Solostar Pen ?Generic drug: insulin glargine (1 Unit Dial) ?Inject 46 Units into the skin daily. Sliding scale ?  ? ?  ? ? ?Allergies:  ?Allergies  ?Allergen Reactions  ? Iohexol Other (See Comments)  ?   Desc: "shuts kidneys down" ?  ? Morphine And Related Shortness Of Breath  ? Hydrocodone Nausea And Vomiting  ? Sulfa Antibiotics Other (See Comments)  ?  UNKNOWN REACTION  ? Sulfonamide Derivatives Other (See Comments)  ?  UNKNOWN  REACTION  ? Tramadol Rash  ? Trazodone Rash and Other (See Comments)  ?  Pain  ? Trazodone And Nefazodone Rash  ?  Pain  ? ? ?Past Medical History, Surgical history, Social history, and Family History were reviewed and updated. ? ?Review of Systems: ?All other 10 point review of systems is negative.  ? ?Physical Exam: ? height is 6' (1.829 m) and weight is 255 lb (115.7 kg). His oral temperature is 97.9 ?F (36.6 ?C). His blood pressure is 145/59 (abnormal) and his pulse is 62. His respiration is 18 and oxygen saturation is 100%.  ? ?Wt Readings from Last 3 Encounters:  ?01/27/22 255 lb (115.7 kg)  ?01/20/22 252 lb (114.3 kg)  ?12/17/21 227 lb (103 kg)  ? ? ?Ocular: Sclerae unicteric, pupils equal, round and reactive to  light ?Ear-nose-throat: Oropharynx clear, dentition fair ?Lymphatic: No cervical or supraclavicular adenopathy ?Lungs no rales or rhonchi, good excursion bilaterally ?Heart regular rate and rhythm, no murmur appreciated ?Abd soft, nontender, positive bowel sounds ?MSK no focal spinal tenderness, no joint edema ?Neuro: non-focal, well-oriented, appropriate affect ?Breasts: Deferred  ? ?Lab Results  ?Component Value Date  ? WBC 5.1 01/27/2022  ? HGB 10.9 (L) 01/27/2022  ? HCT 31.5 (L) 01/27/2022  ? MCV 91.3 01/27/2022  ? PLT 89 (L) 01/27/2022  ? ?Lab Results  ?Component Value Date  ? FERRITIN 80 11/24/2021  ? IRON 68 11/24/2021  ? TIBC 356 11/24/2021  ? UIBC 288 11/24/2021  ? IRONPCTSAT 19 11/24/2021  ? ?Lab Results  ?Component Value Date  ? RETICCTPCT 4.8 (H) 01/27/2022  ? RBC 3.46 (L) 01/27/2022  ? ?Lab Results  ?Component Value Date  ? KPAFRELGTCHN 49.7 (H) 02/17/2020  ? LAMBDASER 40.1 (H) 02/17/2020  ? KAPLAMBRATIO 6.90 04/30/2020  ? ?Lab Results  ?Component Value Date  ? IGGSERUM 1,267 04/29/2020  ? IGA 231 04/29/2020  ? IGMSERUM 36 04/29/2020  ? ?Lab Results  ?Component Value Date  ? TOTALPROTELP 6.5 04/29/2020  ? ALBUMINELP 2.9 02/17/2020  ? A1GS 0.4 02/17/2020  ? A2GS 0.7 02/17/2020  ? BETS 0.8 02/17/2020  ? GAMS 0.7 02/17/2020  ? MSPIKE 0.3 (H) 02/17/2020  ? SPEI Comment 02/17/2020  ? ?  Chemistry   ?   ?Component Value Date/Time  ? NA 142 01/05/2022 1253  ? K 5.1 01/05/2022 1253  ? CL 111 01/05/2022 1253  ? CO2 23 01/05/2022 1253  ? BUN 49 (H) 01/05/2022 1253  ? CREATININE 1.83 (H) 01/05/2022 1253  ?    ?Component Value Date/Time  ? CALCIUM 9.0 01/05/2022 1253  ? ALKPHOS 77 01/05/2022 1253  ? AST 16 01/05/2022 1253  ? ALT 19 01/05/2022 1253  ? BILITOT 2.9 (H) 01/05/2022 1253  ?  ? ? ? ?Impression and Plan: Mr. Burnsworth is a very pleasant 81 yo caucasian gentleman with myelodysplasia -- refractory anemia with ringed sideroblast.  ?ESA and Luspatercept given, Hgb 10.9.  ?Lab check and injection every 3 weeks and  follow-up in 9 weeks.  ? ?Lottie Dawson, NP ?5/9/20231:24 PM ? ?

## 2022-02-17 ENCOUNTER — Inpatient Hospital Stay: Payer: Medicare Other | Attending: Hematology & Oncology

## 2022-02-17 ENCOUNTER — Inpatient Hospital Stay: Payer: Medicare Other

## 2022-02-17 DIAGNOSIS — D461 Refractory anemia with ring sideroblasts: Secondary | ICD-10-CM | POA: Insufficient documentation

## 2022-02-17 DIAGNOSIS — D4620 Refractory anemia with excess of blasts, unspecified: Secondary | ICD-10-CM

## 2022-02-17 LAB — CMP (CANCER CENTER ONLY)
ALT: 10 U/L (ref 0–44)
AST: 13 U/L — ABNORMAL LOW (ref 15–41)
Albumin: 4 g/dL (ref 3.5–5.0)
Alkaline Phosphatase: 86 U/L (ref 38–126)
Anion gap: 6 (ref 5–15)
BUN: 42 mg/dL — ABNORMAL HIGH (ref 8–23)
CO2: 27 mmol/L (ref 22–32)
Calcium: 9.2 mg/dL (ref 8.9–10.3)
Chloride: 105 mmol/L (ref 98–111)
Creatinine: 2 mg/dL — ABNORMAL HIGH (ref 0.61–1.24)
GFR, Estimated: 33 mL/min — ABNORMAL LOW (ref >60)
Glucose, Bld: 191 mg/dL — ABNORMAL HIGH (ref 70–99)
Potassium: 4.9 mmol/L (ref 3.5–5.1)
Sodium: 138 mmol/L (ref 135–145)
Total Bilirubin: 2.3 mg/dL — ABNORMAL HIGH (ref 0.3–1.2)
Total Protein: 6.2 g/dL — ABNORMAL LOW (ref 6.5–8.1)

## 2022-02-17 LAB — CBC WITH DIFFERENTIAL (CANCER CENTER ONLY)
Abs Immature Granulocytes: 0.04 10*3/uL (ref 0.00–0.07)
Basophils Absolute: 0.1 10*3/uL (ref 0.0–0.1)
Basophils Relative: 1 %
Eosinophils Absolute: 0.1 10*3/uL (ref 0.0–0.5)
Eosinophils Relative: 2 %
HCT: 33.8 % — ABNORMAL LOW (ref 39.0–52.0)
Hemoglobin: 11.5 g/dL — ABNORMAL LOW (ref 13.0–17.0)
Immature Granulocytes: 1 %
Lymphocytes Relative: 24 %
Lymphs Abs: 1.7 10*3/uL (ref 0.7–4.0)
MCH: 31.3 pg (ref 26.0–34.0)
MCHC: 34 g/dL (ref 30.0–36.0)
MCV: 92.1 fL (ref 80.0–100.0)
Monocytes Absolute: 0.6 10*3/uL (ref 0.1–1.0)
Monocytes Relative: 8 %
Neutro Abs: 4.4 10*3/uL (ref 1.7–7.7)
Neutrophils Relative %: 64 %
Platelet Count: 102 10*3/uL — ABNORMAL LOW (ref 150–400)
RBC: 3.67 MIL/uL — ABNORMAL LOW (ref 4.22–5.81)
RDW: 16.3 % — ABNORMAL HIGH (ref 11.5–15.5)
WBC Count: 6.9 10*3/uL (ref 4.0–10.5)
nRBC: 0 % (ref 0.0–0.2)

## 2022-03-10 ENCOUNTER — Inpatient Hospital Stay: Payer: Medicare Other

## 2022-03-10 ENCOUNTER — Other Ambulatory Visit: Payer: Self-pay | Admitting: Oncology

## 2022-03-10 ENCOUNTER — Inpatient Hospital Stay: Payer: Medicare Other | Attending: Hematology & Oncology

## 2022-03-10 VITALS — BP 157/60 | HR 84 | Temp 97.8°F | Resp 18

## 2022-03-10 DIAGNOSIS — D461 Refractory anemia with ring sideroblasts: Secondary | ICD-10-CM | POA: Diagnosis present

## 2022-03-10 DIAGNOSIS — D4620 Refractory anemia with excess of blasts, unspecified: Secondary | ICD-10-CM

## 2022-03-10 DIAGNOSIS — Z79899 Other long term (current) drug therapy: Secondary | ICD-10-CM | POA: Insufficient documentation

## 2022-03-10 DIAGNOSIS — D518 Other vitamin B12 deficiency anemias: Secondary | ICD-10-CM

## 2022-03-10 LAB — CMP (CANCER CENTER ONLY)
ALT: 14 U/L (ref 0–44)
AST: 16 U/L (ref 15–41)
Albumin: 3.9 g/dL (ref 3.5–5.0)
Alkaline Phosphatase: 80 U/L (ref 38–126)
Anion gap: 4 — ABNORMAL LOW (ref 5–15)
BUN: 38 mg/dL — ABNORMAL HIGH (ref 8–23)
CO2: 26 mmol/L (ref 22–32)
Calcium: 8.9 mg/dL (ref 8.9–10.3)
Chloride: 109 mmol/L (ref 98–111)
Creatinine: 1.79 mg/dL — ABNORMAL HIGH (ref 0.61–1.24)
GFR, Estimated: 38 mL/min — ABNORMAL LOW (ref >60)
Glucose, Bld: 257 mg/dL — ABNORMAL HIGH (ref 70–99)
Potassium: 4.7 mmol/L (ref 3.5–5.1)
Sodium: 139 mmol/L (ref 135–145)
Total Bilirubin: 3 mg/dL — ABNORMAL HIGH (ref 0.3–1.2)
Total Protein: 6.4 g/dL — ABNORMAL LOW (ref 6.5–8.1)

## 2022-03-10 LAB — CBC WITH DIFFERENTIAL (CANCER CENTER ONLY)
Abs Immature Granulocytes: 0.01 10*3/uL (ref 0.00–0.07)
Basophils Absolute: 0 10*3/uL (ref 0.0–0.1)
Basophils Relative: 1 %
Eosinophils Absolute: 0.1 10*3/uL (ref 0.0–0.5)
Eosinophils Relative: 2 %
HCT: 31.1 % — ABNORMAL LOW (ref 39.0–52.0)
Hemoglobin: 10.6 g/dL — ABNORMAL LOW (ref 13.0–17.0)
Immature Granulocytes: 0 %
Lymphocytes Relative: 20 %
Lymphs Abs: 1 10*3/uL (ref 0.7–4.0)
MCH: 31.4 pg (ref 26.0–34.0)
MCHC: 34.1 g/dL (ref 30.0–36.0)
MCV: 92 fL (ref 80.0–100.0)
Monocytes Absolute: 0.3 10*3/uL (ref 0.1–1.0)
Monocytes Relative: 7 %
Neutro Abs: 3.5 10*3/uL (ref 1.7–7.7)
Neutrophils Relative %: 70 %
Platelet Count: 100 10*3/uL — ABNORMAL LOW (ref 150–400)
RBC: 3.38 MIL/uL — ABNORMAL LOW (ref 4.22–5.81)
RDW: 15.4 % (ref 11.5–15.5)
WBC Count: 4.9 10*3/uL (ref 4.0–10.5)
nRBC: 0 % (ref 0.0–0.2)

## 2022-03-10 MED ORDER — LUSPATERCEPT-AAMT 75 MG ~~LOC~~ SOLR
100.0000 mg | Freq: Once | SUBCUTANEOUS | Status: AC
  Administered 2022-03-10: 100 mg via SUBCUTANEOUS
  Filled 2022-03-10: qty 1.5

## 2022-03-10 MED ORDER — EPOETIN ALFA-EPBX 40000 UNIT/ML IJ SOLN
40000.0000 [IU] | Freq: Once | INTRAMUSCULAR | Status: AC
  Administered 2022-03-10: 40000 [IU] via SUBCUTANEOUS
  Filled 2022-03-10: qty 1

## 2022-03-10 NOTE — Patient Instructions (Signed)
Epoetin Alfa injection What is this medication? EPOETIN ALFA (e POE e tin AL fa) helps your body make more red blood cells. This medicine is used to treat anemia caused by chronic kidney disease, cancer chemotherapy, or HIV-therapy. It may also be used before surgery if you have anemia. This medicine may be used for other purposes; ask your health care provider or pharmacist if you have questions. COMMON BRAND NAME(S): Epogen, Procrit, Retacrit What should I tell my care team before I take this medication? They need to know if you have any of these conditions: cancer heart disease high blood pressure history of blood clots history of stroke low levels of folate, iron, or vitamin B12 in the blood seizures an unusual or allergic reaction to erythropoietin, albumin, benzyl alcohol, hamster proteins, other medicines, foods, dyes, or preservatives pregnant or trying to get pregnant breast-feeding How should I use this medication? This medicine is for injection into a vein or under the skin. It is usually given by a health care professional in a hospital or clinic setting. If you get this medicine at home, you will be taught how to prepare and give this medicine. Use exactly as directed. Take your medicine at regular intervals. Do not take your medicine more often than directed. It is important that you put your used needles and syringes in a special sharps container. Do not put them in a trash can. If you do not have a sharps container, call your pharmacist or healthcare provider to get one. A special MedGuide will be given to you by the pharmacist with each prescription and refill. Be sure to read this information carefully each time. Talk to your pediatrician regarding the use of this medicine in children. While this drug may be prescribed for selected conditions, precautions do apply. Overdosage: If you think you have taken too much of this medicine contact a poison control center or emergency  room at once. NOTE: This medicine is only for you. Do not share this medicine with others. What if I miss a dose? If you miss a dose, take it as soon as you can. If it is almost time for your next dose, take only that dose. Do not take double or extra doses. What may interact with this medication? Interactions have not been studied. This list may not describe all possible interactions. Give your health care provider a list of all the medicines, herbs, non-prescription drugs, or dietary supplements you use. Also tell them if you smoke, drink alcohol, or use illegal drugs. Some items may interact with your medicine. What should I watch for while using this medication? Your condition will be monitored carefully while you are receiving this medicine. You may need blood work done while you are taking this medicine. This medicine may cause a decrease in vitamin B6. You should make sure that you get enough vitamin B6 while you are taking this medicine. Discuss the foods you eat and the vitamins you take with your health care professional. What side effects may I notice from receiving this medication? Side effects that you should report to your doctor or health care professional as soon as possible: allergic reactions like skin rash, itching or hives, swelling of the face, lips, or tongue seizures signs and symptoms of a blood clot such as breathing problems; changes in vision; chest pain; severe, sudden headache; pain, swelling, warmth in the leg; trouble speaking; sudden numbness or weakness of the face, arm or leg signs and symptoms of a stroke like   changes in vision; confusion; trouble speaking or understanding; severe headaches; sudden numbness or weakness of the face, arm or leg; trouble walking; dizziness; loss of balance or coordination Side effects that usually do not require medical attention (report to your doctor or health care professional if they continue or are  bothersome): chills cough dizziness fever headaches joint pain muscle cramps muscle pain nausea, vomiting pain, redness, or irritation at site where injected This list may not describe all possible side effects. Call your doctor for medical advice about side effects. You may report side effects to FDA at 1-800-FDA-1088. Where should I keep my medication? Keep out of the reach of children. Store in a refrigerator between 2 and 8 degrees C (36 and 46 degrees F). Do not freeze or shake. Throw away any unused portion if using a single-dose vial. Multi-dose vials can be kept in the refrigerator for up to 21 days after the initial dose. Throw away unused medicine. NOTE: This sheet is a summary. It may not cover all possible information. If you have questions about this medicine, talk to your doctor, pharmacist, or health care provider.  2023 Elsevier/Gold Standard (2017-05-11 00:00:00) Luspatercept Injection What is this medication? LUSPATERCEPT (lus PAT er sept) treats low levels of red blood cells (anemia) in the body in people with beta thalassemia or myelodysplastic syndromes. It works by helping the body make more red blood cells. This medicine may be used for other purposes; ask your health care provider or pharmacist if you have questions. COMMON BRAND NAME(S): REBLOZYL What should I tell my care team before I take this medication? They need to know if you have any of these conditions: Have had your spleen removed High blood pressure History of blood clots Tobacco use An unusual or allergic reaction to luspatercept, other medications, foods, dyes or preservatives Pregnant or trying to get pregnant Breast-feeding How should I use this medication? This medication is for injection under the skin. It is given by your care team in a hospital or clinic setting. Talk to your care team about the use of the medication in children. This medication is not approved for use in  children. Overdosage: If you think you have taken too much of this medicine contact a poison control center or emergency room at once. NOTE: This medicine is only for you. Do not share this medicine with others. What if I miss a dose? Keep appointments for follow-up doses. It is important not to miss your dose. Call your care team if you are unable to keep an appointment. What may interact with this medication? Interactions are not expected. This list may not describe all possible interactions. Give your health care provider a list of all the medicines, herbs, non-prescription drugs, or dietary supplements you use. Also tell them if you smoke, drink alcohol, or use illegal drugs. Some items may interact with your medicine. What should I watch for while using this medication? Your condition will be monitored carefully while you are receiving this medication. Talk to your care team if you wish to become pregnant or think you might be pregnant. This medication can cause serious birth defects. Discuss contraceptive options with your care team. Do not breastfeed while taking this medication. You may need blood work done while you are taking this medication. What side effects may I notice from receiving this medication? Side effects that you should report to your care team as soon as possible: Allergic reactions--skin rash, itching, hives, swelling of the face, lips, tongue, or  throat Blood clot--pain, swelling, or warmth in the leg, shortness of breath, chest pain Increase in blood pressure Severe back pain, numbness or weakness of the hands, arms, legs, or feet, loss of coordination, loss of bowel or bladder control Side effects that usually do not require medical attention (report these to your care team if they continue or are bothersome): Bone pain Dizziness Fatigue Headache Joint pain Muscle pain Stomach pain This list may not describe all possible side effects. Call your doctor for medical  advice about side effects. You may report side effects to FDA at 1-800-FDA-1088. Where should I keep my medication? This medication is given in a hospital or clinic. It will not be stored at home. NOTE: This sheet is a summary. It may not cover all possible information. If you have questions about this medicine, talk to your doctor, pharmacist, or health care provider.  2023 Elsevier/Gold Standard (2021-04-04 00:00:00)

## 2022-03-16 ENCOUNTER — Telehealth: Payer: Self-pay | Admitting: Cardiology

## 2022-03-16 ENCOUNTER — Other Ambulatory Visit: Payer: Self-pay | Admitting: Cardiology

## 2022-03-16 NOTE — Telephone Encounter (Signed)
*  STAT* If patient is at the pharmacy, call can be transferred to refill team.   1. Which medications need to be refilled? (please list name of each medication and dose if known)   ELIQUIS 5 MG TABS tablet  2. Which pharmacy/location (including street and city if local pharmacy) is medication to be sent to? [facility] - [place], [place] - [address] [place]  3. Do they need a 30 day or 90 day supply?  90 day   Patient is on his last refill

## 2022-03-16 NOTE — Telephone Encounter (Signed)
Prescription refill request for Eliquis received. Indication:Afib Last office visit:5/23 Scr:1.7 Age: 81 Weight:115.7 kg  Under review

## 2022-03-17 ENCOUNTER — Encounter: Payer: Self-pay | Admitting: Pharmacist Clinician (PhC)/ Clinical Pharmacy Specialist

## 2022-03-17 MED ORDER — APIXABAN 2.5 MG PO TABS: 2.5000 mg | ORAL_TABLET | Freq: Two times a day (BID) | ORAL | 1 refills | Status: DC

## 2022-03-18 NOTE — Telephone Encounter (Signed)
Yes, please reduce to 2.'5mg'$  BID

## 2022-03-31 ENCOUNTER — Inpatient Hospital Stay: Payer: Medicare Other

## 2022-03-31 ENCOUNTER — Inpatient Hospital Stay: Payer: Medicare Other | Admitting: Family

## 2022-04-03 ENCOUNTER — Inpatient Hospital Stay (HOSPITAL_BASED_OUTPATIENT_CLINIC_OR_DEPARTMENT_OTHER): Payer: Medicare Other | Admitting: Family

## 2022-04-03 ENCOUNTER — Encounter: Payer: Self-pay | Admitting: Family

## 2022-04-03 ENCOUNTER — Inpatient Hospital Stay: Payer: Medicare Other | Attending: Hematology & Oncology

## 2022-04-03 ENCOUNTER — Inpatient Hospital Stay: Payer: Medicare Other

## 2022-04-03 VITALS — BP 145/69 | HR 84 | Temp 97.6°F | Resp 17 | Wt 252.0 lb

## 2022-04-03 DIAGNOSIS — D461 Refractory anemia with ring sideroblasts: Secondary | ICD-10-CM

## 2022-04-03 DIAGNOSIS — D4620 Refractory anemia with excess of blasts, unspecified: Secondary | ICD-10-CM | POA: Diagnosis not present

## 2022-04-03 DIAGNOSIS — D509 Iron deficiency anemia, unspecified: Secondary | ICD-10-CM | POA: Diagnosis not present

## 2022-04-03 DIAGNOSIS — Z79899 Other long term (current) drug therapy: Secondary | ICD-10-CM | POA: Diagnosis not present

## 2022-04-03 DIAGNOSIS — D469 Myelodysplastic syndrome, unspecified: Secondary | ICD-10-CM | POA: Insufficient documentation

## 2022-04-03 LAB — CBC WITH DIFFERENTIAL (CANCER CENTER ONLY)
Abs Immature Granulocytes: 0.03 10*3/uL (ref 0.00–0.07)
Basophils Absolute: 0.1 10*3/uL (ref 0.0–0.1)
Basophils Relative: 1 %
Eosinophils Absolute: 0.1 10*3/uL (ref 0.0–0.5)
Eosinophils Relative: 2 %
HCT: 36.4 % — ABNORMAL LOW (ref 39.0–52.0)
Hemoglobin: 12.3 g/dL — ABNORMAL LOW (ref 13.0–17.0)
Immature Granulocytes: 1 %
Lymphocytes Relative: 20 %
Lymphs Abs: 1.1 10*3/uL (ref 0.7–4.0)
MCH: 30.8 pg (ref 26.0–34.0)
MCHC: 33.8 g/dL (ref 30.0–36.0)
MCV: 91.2 fL (ref 80.0–100.0)
Monocytes Absolute: 0.4 10*3/uL (ref 0.1–1.0)
Monocytes Relative: 6 %
Neutro Abs: 4.1 10*3/uL (ref 1.7–7.7)
Neutrophils Relative %: 70 %
Platelet Count: 126 10*3/uL — ABNORMAL LOW (ref 150–400)
RBC: 3.99 MIL/uL — ABNORMAL LOW (ref 4.22–5.81)
RDW: 15.5 % (ref 11.5–15.5)
WBC Count: 5.8 10*3/uL (ref 4.0–10.5)
nRBC: 0 % (ref 0.0–0.2)

## 2022-04-03 LAB — CMP (CANCER CENTER ONLY)
ALT: 10 U/L (ref 0–44)
AST: 15 U/L (ref 15–41)
Albumin: 4.3 g/dL (ref 3.5–5.0)
Alkaline Phosphatase: 74 U/L (ref 38–126)
Anion gap: 8 (ref 5–15)
BUN: 37 mg/dL — ABNORMAL HIGH (ref 8–23)
CO2: 29 mmol/L (ref 22–32)
Calcium: 9.3 mg/dL (ref 8.9–10.3)
Chloride: 105 mmol/L (ref 98–111)
Creatinine: 1.97 mg/dL — ABNORMAL HIGH (ref 0.61–1.24)
GFR, Estimated: 34 mL/min — ABNORMAL LOW (ref >60)
Glucose, Bld: 214 mg/dL — ABNORMAL HIGH (ref 70–99)
Potassium: 4.4 mmol/L (ref 3.5–5.1)
Sodium: 142 mmol/L (ref 135–145)
Total Bilirubin: 3.3 mg/dL — ABNORMAL HIGH (ref 0.3–1.2)
Total Protein: 6.2 g/dL — ABNORMAL LOW (ref 6.5–8.1)

## 2022-04-03 LAB — IRON AND IRON BINDING CAPACITY (CC-WL,HP ONLY)
Iron: 82 ug/dL (ref 45–182)
Saturation Ratios: 27 % (ref 17.9–39.5)
TIBC: 307 ug/dL (ref 250–450)
UIBC: 225 ug/dL (ref 117–376)

## 2022-04-03 LAB — FERRITIN: Ferritin: 80 ng/mL (ref 24–336)

## 2022-04-03 LAB — RETICULOCYTES
Immature Retic Fract: 15.6 % (ref 2.3–15.9)
RBC.: 3.99 MIL/uL — ABNORMAL LOW (ref 4.22–5.81)
Retic Count, Absolute: 199.1 10*3/uL — ABNORMAL HIGH (ref 19.0–186.0)
Retic Ct Pct: 5 % — ABNORMAL HIGH (ref 0.4–3.1)

## 2022-04-03 NOTE — Progress Notes (Signed)
Hematology and Oncology Follow Up Visit  Eric [name] [id number] May 27, 1941 81 y.o. 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 is here today for follow-up. Hgb is 12.3! No injections needed this visit.  No blood loss, bruising or petechiae.  He has occasional SOB and palpitations with atrial fib.  No fever, chills, n/v, cough, rash, dizziness, chest pain, abdominal pain or changes in bowel or bladder habits.  No swelling in his extremities.  Numbness and tingling comes and goes in hands and feet.  No falls or syncope.  Appetite and hydration are good. Weight is stable at 252 lbs.   ECOG Performance Status: 1 - Symptomatic but completely ambulatory  Medications:  Allergies as of 04/03/2022       Reactions   Iohexol Other (See Comments)    Desc: "shuts kidneys down"   Morphine And Related Shortness Of Breath   Hydrocodone Nausea And Vomiting   Sulfa Antibiotics Other (See Comments)   UNKNOWN REACTION   Sulfonamide Derivatives Other (See Comments)   UNKNOWN REACTION   Tramadol Rash   Trazodone Rash, Other (See Comments)   Pain   Trazodone And Nefazodone Rash   Pain        Medication List        Accurate as of April 03, 2022  1:26 PM. If you have any questions, ask your nurse or doctor.          acetaminophen 325 MG tablet Commonly known as: TYLENOL Take 650 mg by mouth every 6 (six) hours as needed for moderate pain.   allopurinol 100 MG tablet Commonly known as: ZYLOPRIM Take 100 mg by mouth daily.   apixaban 2.5 MG Tabs tablet Commonly known as: ELIQUIS Take 1 tablet (2.5 mg total) by mouth 2 (two) times daily.   insulin lispro 100 UNIT/ML KwikPen Commonly known as: HUMALOG Inject 20 Units into the skin 3 (three) times  daily.   levothyroxine 100 MCG tablet Commonly known as: SYNTHROID Take 50-100 mcg by mouth See admin instructions. Takes 100 mg daily except Sundays, on Sunday takes 50 mg   OneTouch Delica Lancets 08M Misc 4 (four) times daily.   silodosin 8 MG Caps capsule Commonly known as: RAPAFLO Take 8 mg by mouth daily.   simvastatin 20 MG tablet Commonly known as: ZOCOR Take 1 tablet (20 mg total) by mouth at bedtime.   spironolactone 50 MG tablet Commonly known as: ALDACTONE Take 1 tablet (50 mg total) by mouth daily.   torsemide 20 MG tablet Commonly known as: DEMADEX Take 1 tablet (20 mg total) by mouth 2 (two) times daily. Please only take a second pill on M-W-F   Toujeo SoloStar 300 UNIT/ML Solostar Pen Generic drug: insulin glargine (1 Unit Dial) Inject 46 Units into the skin daily. Sliding scale        Allergies:  Allergies  Allergen Reactions   Iohexol Other (See Comments)     Desc: "shuts kidneys down"    Morphine And Related Shortness Of Breath   Hydrocodone Nausea And Vomiting   Sulfa Antibiotics Other (See Comments)    UNKNOWN REACTION   Sulfonamide Derivatives Other (See Comments)    UNKNOWN REACTION   Tramadol Rash   Trazodone Rash and  Other (See Comments)    Pain   Trazodone And Nefazodone Rash    Pain    Past Medical History, Surgical history, Social history, and Family History were reviewed and updated.  Review of Systems: All other 10 point review of systems is negative.   Physical Exam:  vitals were not taken for this visit.   Wt Readings from Last 3 Encounters:  01/27/22 255 lb (115.7 kg)  01/20/22 252 lb (114.3 kg)  12/17/21 227 lb (103 kg)    Ocular: Sclerae unicteric, pupils equal, round and reactive to light Ear-nose-throat: Oropharynx clear, dentition fair Lymphatic: No cervical or supraclavicular adenopathy Lungs no rales or rhonchi, good excursion bilaterally Heart regular rate and rhythm, no murmur appreciated Abd soft,  nontender, positive bowel sounds MSK no focal spinal tenderness, no joint edema Neuro: non-focal, well-oriented, appropriate affect Breasts: Deferred   Lab Results  Component Value Date   WBC 5.8 04/03/2022   HGB 12.3 (L) 04/03/2022   HCT 36.4 (L) 04/03/2022   MCV 91.2 04/03/2022   PLT 126 (L) 04/03/2022   Lab Results  Component Value Date   FERRITIN 111 01/27/2022   IRON 108 01/27/2022   TIBC 318 01/27/2022   UIBC 210 01/27/2022   IRONPCTSAT 34 01/27/2022   Lab Results  Component Value Date   RETICCTPCT 5.0 (H) 04/03/2022   RBC 3.99 (L) 04/03/2022   RBC 3.99 (L) 04/03/2022   Lab Results  Component Value Date   KPAFRELGTCHN 49.7 (H) 02/17/2020   LAMBDASER 40.1 (H) 02/17/2020   KAPLAMBRATIO 6.90 04/30/2020   Lab Results  Component Value Date   IGGSERUM 1,267 04/29/2020   IGA 231 04/29/2020   IGMSERUM 36 04/29/2020   Lab Results  Component Value Date   TOTALPROTELP 6.5 04/29/2020   ALBUMINELP 2.9 02/17/2020   A1GS 0.4 02/17/2020   A2GS 0.7 02/17/2020   BETS 0.8 02/17/2020   GAMS 0.7 02/17/2020   MSPIKE 0.3 (H) 02/17/2020   SPEI Comment 02/17/2020     Chemistry      Component Value Date/Time   NA 139 03/10/2022 1324   K 4.7 03/10/2022 1324   CL 109 03/10/2022 1324   CO2 26 03/10/2022 1324   BUN 38 (H) 03/10/2022 1324   CREATININE 1.79 (H) 03/10/2022 1324      Component Value Date/Time   CALCIUM 8.9 03/10/2022 1324   ALKPHOS 80 03/10/2022 1324   AST 16 03/10/2022 1324   ALT 14 03/10/2022 1324   BILITOT 3.0 (H) 03/10/2022 1324       Impression and Plan: [name] is a very pleasant 81 yo caucasian gentleman with myelodysplasia -- refractory anemia with ringed sideroblast.  No injections needed with Hgb 12.3.  Lab check every 3 weeks and follow-up in 12 weeks.   Lottie Dawson, NP 7/14/20231:26 PM

## 2022-04-07 ENCOUNTER — Telehealth: Payer: Self-pay | Admitting: *Deleted

## 2022-04-07 NOTE — Telephone Encounter (Signed)
Per 04/03/22 los - called and gave upcoming appointments - confirmed

## 2022-04-09 ENCOUNTER — Telehealth: Payer: Self-pay | Admitting: Cardiology

## 2022-04-09 MED ORDER — SPIRONOLACTONE 50 MG PO TABS: 50.0000 mg | ORAL_TABLET | Freq: Every day | ORAL | 1 refills | Status: DC

## 2022-04-09 NOTE — Telephone Encounter (Signed)
Rx refill sent to pharmacy. 

## 2022-04-09 NOTE — Telephone Encounter (Signed)
*  STAT* If patient is at the pharmacy, call can be transferred to refill team.   1. Which medications need to be refilled? (please list name of each medication and dose if known)   spironolactone (ALDACTONE) 50 MG tablet   2. Which pharmacy/location (including street and city if local pharmacy) is medication to be sent to?  [facility] - [place], [place]  3. Do they need a 30 day or 90 day supply?   90 day   Patient called stating he only has 7 days left of this medication.

## 2022-04-13 ENCOUNTER — Other Ambulatory Visit: Payer: Self-pay

## 2022-04-23 ENCOUNTER — Other Ambulatory Visit: Payer: Self-pay

## 2022-04-24 ENCOUNTER — Inpatient Hospital Stay: Payer: Medicare Other | Attending: Hematology & Oncology

## 2022-04-24 ENCOUNTER — Inpatient Hospital Stay: Payer: Medicare Other

## 2022-04-24 VITALS — BP 176/60 | HR 68 | Temp 97.9°F | Resp 18

## 2022-04-24 DIAGNOSIS — D509 Iron deficiency anemia, unspecified: Secondary | ICD-10-CM

## 2022-04-24 DIAGNOSIS — D4620 Refractory anemia with excess of blasts, unspecified: Secondary | ICD-10-CM

## 2022-04-24 DIAGNOSIS — D461 Refractory anemia with ring sideroblasts: Secondary | ICD-10-CM

## 2022-04-24 DIAGNOSIS — Z79899 Other long term (current) drug therapy: Secondary | ICD-10-CM | POA: Insufficient documentation

## 2022-04-24 LAB — CBC WITH DIFFERENTIAL (CANCER CENTER ONLY)
Abs Immature Granulocytes: 0.02 10*3/uL (ref 0.00–0.07)
Basophils Absolute: 0 10*3/uL (ref 0.0–0.1)
Basophils Relative: 0 %
Eosinophils Absolute: 0.1 10*3/uL (ref 0.0–0.5)
Eosinophils Relative: 2 %
HCT: 32.4 % — ABNORMAL LOW (ref 39.0–52.0)
Hemoglobin: 11 g/dL — ABNORMAL LOW (ref 13.0–17.0)
Immature Granulocytes: 0 %
Lymphocytes Relative: 21 %
Lymphs Abs: 1 10*3/uL (ref 0.7–4.0)
MCH: 30.9 pg (ref 26.0–34.0)
MCHC: 34 g/dL (ref 30.0–36.0)
MCV: 91 fL (ref 80.0–100.0)
Monocytes Absolute: 0.4 10*3/uL (ref 0.1–1.0)
Monocytes Relative: 7 %
Neutro Abs: 3.3 10*3/uL (ref 1.7–7.7)
Neutrophils Relative %: 70 %
Platelet Count: 103 10*3/uL — ABNORMAL LOW (ref 150–400)
RBC: 3.56 MIL/uL — ABNORMAL LOW (ref 4.22–5.81)
RDW: 15.3 % (ref 11.5–15.5)
WBC Count: 4.8 10*3/uL (ref 4.0–10.5)
nRBC: 0 % (ref 0.0–0.2)

## 2022-04-24 LAB — CMP (CANCER CENTER ONLY)
ALT: 12 U/L (ref 0–44)
AST: 14 U/L — ABNORMAL LOW (ref 15–41)
Albumin: 4.1 g/dL (ref 3.5–5.0)
Alkaline Phosphatase: 73 U/L (ref 38–126)
Anion gap: 7 (ref 5–15)
BUN: 49 mg/dL — ABNORMAL HIGH (ref 8–23)
CO2: 27 mmol/L (ref 22–32)
Calcium: 9.5 mg/dL (ref 8.9–10.3)
Chloride: 106 mmol/L (ref 98–111)
Creatinine: 2.15 mg/dL — ABNORMAL HIGH (ref 0.61–1.24)
GFR, Estimated: 30 mL/min — ABNORMAL LOW (ref >60)
Glucose, Bld: 250 mg/dL — ABNORMAL HIGH (ref 70–99)
Potassium: 4.6 mmol/L (ref 3.5–5.1)
Sodium: 140 mmol/L (ref 135–145)
Total Bilirubin: 2.8 mg/dL — ABNORMAL HIGH (ref 0.3–1.2)
Total Protein: 6 g/dL — ABNORMAL LOW (ref 6.5–8.1)

## 2022-04-24 MED ORDER — LUSPATERCEPT-AAMT 75 MG ~~LOC~~ SOLR
100.0000 mg | Freq: Once | SUBCUTANEOUS | Status: AC
  Administered 2022-04-24: 100 mg via SUBCUTANEOUS
  Filled 2022-04-24: qty 0.5

## 2022-04-24 NOTE — Patient Instructions (Signed)
Luspatercept Injection What is this medication? LUSPATERCEPT (lus PAT er sept) treats low levels of red blood cells (anemia) in the body in people with beta thalassemia or myelodysplastic syndromes. It works by helping the body make more red blood cells. This medicine may be used for other purposes; ask your health care provider or pharmacist if you have questions. COMMON BRAND NAME(S): REBLOZYL What should I tell my care team before I take this medication? They need to know if you have any of these conditions: Have had your spleen removed High blood pressure History of blood clots Tobacco use An unusual or allergic reaction to luspatercept, other medications, foods, dyes or preservatives Pregnant or trying to get pregnant Breast-feeding How should I use this medication? This medication is for injection under the skin. It is given by your care team in a hospital or clinic setting. Talk to your care team about the use of the medication in children. This medication is not approved for use in children. Overdosage: If you think you have taken too much of this medicine contact a poison control center or emergency room at once. NOTE: This medicine is only for you. Do not share this medicine with others. What if I miss a dose? Keep appointments for follow-up doses. It is important not to miss your dose. Call your care team if you are unable to keep an appointment. What may interact with this medication? Interactions are not expected. This list may not describe all possible interactions. Give your health care provider a list of all the medicines, herbs, non-prescription drugs, or dietary supplements you use. Also tell them if you smoke, drink alcohol, or use illegal drugs. Some items may interact with your medicine. What should I watch for while using this medication? Your condition will be monitored carefully while you are receiving this medication. Talk to your care team if you wish to become  pregnant or think you might be pregnant. This medication can cause serious birth defects. Discuss contraceptive options with your care team. Do not breastfeed while taking this medication. You may need blood work done while you are taking this medication. What side effects may I notice from receiving this medication? Side effects that you should report to your care team as soon as possible: Allergic reactions--skin rash, itching, hives, swelling of the face, lips, tongue, or throat Blood clot--pain, swelling, or warmth in the leg, shortness of breath, chest pain Increase in blood pressure Severe back pain, numbness or weakness of the hands, arms, legs, or feet, loss of coordination, loss of bowel or bladder control Side effects that usually do not require medical attention (report these to your care team if they continue or are bothersome): Bone pain Dizziness Fatigue Headache Joint pain Muscle pain Stomach pain This list may not describe all possible side effects. Call your doctor for medical advice about side effects. You may report side effects to FDA at 1-800-FDA-1088. Where should I keep my medication? This medication is given in a hospital or clinic. It will not be stored at home. NOTE: This sheet is a summary. It may not cover all possible information. If you have questions about this medicine, talk to your doctor, pharmacist, or health care provider.  2023 Elsevier/Gold Standard (2021-04-04 00:00:00)  

## 2022-04-25 ENCOUNTER — Emergency Department (HOSPITAL_COMMUNITY)
Admission: EM | Admit: 2022-04-25 | Discharge: 2022-04-25 | Disposition: A | Discharge status: Home / Self Care | Payer: Medicare Other | Attending: Emergency Medicine | Admitting: Emergency Medicine

## 2022-04-25 ENCOUNTER — Encounter (HOSPITAL_COMMUNITY): Payer: Self-pay

## 2022-04-25 ENCOUNTER — Other Ambulatory Visit: Payer: Self-pay

## 2022-04-25 DIAGNOSIS — Z7901 Long term (current) use of anticoagulants: Secondary | ICD-10-CM | POA: Diagnosis not present

## 2022-04-25 DIAGNOSIS — E119 Type 2 diabetes mellitus without complications: Secondary | ICD-10-CM | POA: Diagnosis not present

## 2022-04-25 DIAGNOSIS — I509 Heart failure, unspecified: Secondary | ICD-10-CM | POA: Insufficient documentation

## 2022-04-25 DIAGNOSIS — D649 Anemia, unspecified: Secondary | ICD-10-CM | POA: Diagnosis not present

## 2022-04-25 DIAGNOSIS — I130 Hypertensive heart and chronic kidney disease with heart failure and stage 1 through stage 4 chronic kidney disease, or unspecified chronic kidney disease: Secondary | ICD-10-CM | POA: Diagnosis not present

## 2022-04-25 DIAGNOSIS — R040 Epistaxis: Secondary | ICD-10-CM | POA: Diagnosis present

## 2022-04-25 DIAGNOSIS — N189 Chronic kidney disease, unspecified: Secondary | ICD-10-CM | POA: Diagnosis not present

## 2022-04-25 DIAGNOSIS — I2510 Atherosclerotic heart disease of native coronary artery without angina pectoris: Secondary | ICD-10-CM | POA: Insufficient documentation

## 2022-04-25 DIAGNOSIS — Z794 Long term (current) use of insulin: Secondary | ICD-10-CM | POA: Insufficient documentation

## 2022-04-25 LAB — CBC
HCT: 32.6 % — ABNORMAL LOW (ref 39.0–52.0)
Hemoglobin: 10.9 g/dL — ABNORMAL LOW (ref 13.0–17.0)
MCH: 30.6 pg (ref 26.0–34.0)
MCHC: 33.4 g/dL (ref 30.0–36.0)
MCV: 91.6 fL (ref 80.0–100.0)
Platelets: 100 10*3/uL — ABNORMAL LOW (ref 150–400)
RBC: 3.56 MIL/uL — ABNORMAL LOW (ref 4.22–5.81)
RDW: 15.5 % (ref 11.5–15.5)
WBC: 4.6 10*3/uL (ref 4.0–10.5)
nRBC: 0 % (ref 0.0–0.2)

## 2022-04-25 NOTE — ED Provider Notes (Signed)
[facility] DEPT Provider Note   CSN: [id number] Arrival date & time: 04/25/22  0555     History  Chief Complaint  Patient presents with   Epistaxis    Eric [name] is a 81 y.o. male.   Epistaxis Patient presents for epistaxis.  Medical history includes MDS, DM, HTN, atrial fibrillation, BPH, CHF, gout, CAD, CKD, arthritis.  He has mild anemia at baseline.  Yesterday, he did undergo a Luspatercept infusion at the cancer center.  Approximately 10 PM, patient had a left-sided nosebleed.  This persisted throughout the night.  Due to the persistence of the nosebleed, patient called EMS this morning.  At around the time that he was getting loaded in the ambulance, nosebleed stopped.  He attributes this to the cooler outside.  He has not had any recurrence since that time.  Patient does endorse some mild lightheadedness.  He denies any other physical complaints.  He is on Eliquis for atrial fibrillation.  His last dose was last night at 10 PM.     Home Medications Prior to Admission medications   Medication Sig Start Date End Date Taking? Authorizing Provider  acetaminophen (TYLENOL) 325 MG tablet Take 650 mg by mouth every 6 (six) hours as needed for moderate pain.    [provider]  allopurinol (ZYLOPRIM) 100 MG tablet Take 100 mg by mouth daily. 05/15/20   [provider]  apixaban (ELIQUIS) 2.5 MG TABS tablet Take 1 tablet (2.5 mg total) by mouth 2 (two) times daily. 03/17/22   Richardo Priest, MD  insulin glargine, 1 Unit Dial, (TOUJEO SOLOSTAR) 300 UNIT/ML Solostar Pen Inject 46 Units into the skin daily. Sliding scale 08/31/15   [provider]  insulin lispro (HUMALOG) 100 UNIT/ML KwikPen Inject 20 Units into the skin 3 (three) times daily. 04/15/15   [provider]  levothyroxine (SYNTHROID) 100 MCG tablet Take 50-100 mcg by mouth See admin instructions. Takes 100 mg daily except Sundays, on Sunday takes 50 mg 01/14/20    [provider]  OneTouch Delica Lancets 44I MISC 4 (four) times daily. 10/17/20   [provider]  silodosin (RAPAFLO) 8 MG CAPS capsule Take 8 mg by mouth daily.  12/13/19   [provider]  simvastatin (ZOCOR) 20 MG tablet Take 1 tablet (20 mg total) by mouth at bedtime. 11/18/21   Richardo Priest, MD  spironolactone (ALDACTONE) 50 MG tablet Take 1 tablet (50 mg total) by mouth daily. 04/09/22   Richardo Priest, MD  torsemide (DEMADEX) 20 MG tablet Take 1 tablet (20 mg total) by mouth 2 (two) times daily. Please only take a second pill on M-W-F 01/20/22   Richardo Priest, MD  traZODone (DESYREL) 50 MG tablet  03/14/20 04/19/20  [provider]      Allergies    Iohexol, Morphine and related, Hydrocodone, Sulfa antibiotics, Sulfonamide derivatives, Tramadol, Trazodone, and Trazodone and nefazodone    Review of Systems   Review of Systems  HENT:  Positive for nosebleeds.   Neurological:  Positive for light-headedness.  All other systems reviewed and are negative.   Physical Exam Updated Vital Signs BP (!) 149/80 (BP Location: Right Arm)   Pulse 84   Temp 98.5 F (36.9 C) (Oral)   Resp 18   Ht 6' (1.829 m)   Wt 114 kg   SpO2 98%   BMI 34.09 kg/m  Physical Exam Vitals and nursing note reviewed.  Constitutional:      General: He  is not in acute distress.    Appearance: Normal appearance. He is well-developed. He is not ill-appearing, toxic-appearing or diaphoretic.  HENT:     Head: Normocephalic and atraumatic.     Right Ear: External ear normal.     Left Ear: External ear normal.     Nose: Nose normal.     Mouth/Throat:     Mouth: Mucous membranes are moist.     Pharynx: Oropharynx is clear.  Eyes:     Extraocular Movements: Extraocular movements intact.     Conjunctiva/sclera: Conjunctivae normal.  Cardiovascular:     Rate and Rhythm: Normal rate and regular rhythm.  Pulmonary:     Effort: Pulmonary effort is normal. No respiratory  distress.  Abdominal:     Palpations: Abdomen is soft.     Tenderness: There is no abdominal tenderness.  Musculoskeletal:        General: No swelling. Normal range of motion.     Cervical back: Normal range of motion and neck supple.  Skin:    General: Skin is warm and dry.  Neurological:     General: No focal deficit present.     Mental Status: He is alert and oriented to person, place, and time.     Cranial Nerves: No cranial nerve deficit.     Sensory: No sensory deficit.     Motor: No weakness.     Coordination: Coordination normal.  Psychiatric:        Mood and Affect: Mood normal.        Behavior: Behavior normal.        Thought Content: Thought content normal.        Judgment: Judgment normal.     ED Results / Procedures / Treatments   Labs (all labs ordered are listed, but only abnormal results are displayed) Labs Reviewed  CBC - Abnormal; Notable for the following components:      Result Value   RBC 3.56 (*)    Hemoglobin 10.9 (*)    HCT 32.6 (*)    Platelets 100 (*)    All other components within normal limits    EKG None  Radiology No results found.  Procedures Procedures    Medications Ordered in ED Medications - No data to display  ED Course/ Medical Decision Making/ A&P                           Medical Decision Making  This patient presents to the ED for concern of epistaxis, this involves an extensive number of treatment options, and is a complaint that carries with it a high risk of complications and morbidity.  The differential diagnosis includes symptomatic anemia, coagulopathy   Co morbidities that complicate the patient evaluation  MDS, DM, HTN, atrial fibrillation, BPH, CHF, gout, CAD, CKD, arthritis   Additional history obtained:  Additional history obtained from N/A External records from outside source obtained and reviewed including EMR   Lab Tests:  I Ordered, and personally interpreted labs.  The pertinent results  include: No acute drop in hemoglobin   Problem List / ED Course / Critical interventions / Medication management  Patient is 81 year old male presenting for epistaxis.  He reports that he had onset of epistaxis last night and that bleeding continued throughout the night.  On arrival in the ED, his bleeding has resolved.  His vital signs are normal.  He is well-appearing on exam.  Yesterday, prior to onset of his  nosebleed, patient did undergo an infusion at the cancer center.  At that time, he did have blood work drawn.  Hemoglobin yesterday was 11.  When checked this morning, hemoglobin was 10.9.  Patient has not had an acute drop in his hemoglobin.  He was observed in the ED for several hours without any recurrence of epistaxis.  He was advised to hold his Eliquis for the next 24 hours and to return for any recurrence of persistent nosebleed.  He was discharged in good condition.   Social Determinants of Health:  Has access to outpatient care        Final Clinical Impression(s) / ED Diagnoses Final diagnoses:  Epistaxis    Rx / DC Orders ED Discharge Orders     None         Godfrey Pick, MD 04/26/22 [phone]

## 2022-04-25 NOTE — ED Triage Notes (Signed)
BIBA for intermittent nose bleed to bilateral nares. Pt on eliquis. Ems admin afrin and bleeding stopped immediately.  Denies trauma.

## 2022-04-25 NOTE — Discharge Instructions (Addendum)
Hold off on taking your Eliquis today.  If you have no recurrence of nosebleed, you can resume taking it tomorrow.  If you do experience recurrence of nosebleed, pack your nostril and apply external pressure.  Return to the emergency department for any persistent episodes.

## 2022-04-25 NOTE — ED Provider Triage Note (Signed)
Emergency Medicine Provider Triage Evaluation Note  Eric [name] , a 81 y.o. male  was evaluated in triage.  Pt complains of epistaxis.  Review of Systems  Positive: Nose bleed Negative: pain  Physical Exam  Ht 1.829 m (6')   Wt 114 kg   BMI 34.09 kg/m  Gen:   Awake, no distress   Resp:  Normal effort  MSK:   Moves extremities without difficulty  Other:  No active epistaxis  Medical Decision Making  Medically screening exam initiated at 6:22 AM.  Appropriate orders placed.  Eric [name] was informed that the remainder of the evaluation will be completed by another provider, this initial triage assessment does not replace that evaluation, and the importance of remaining in the ED until their evaluation is complete.      Ripley Fraise, MD 04/25/22 [phone]

## 2022-04-27 ENCOUNTER — Telehealth: Payer: Self-pay | Admitting: *Deleted

## 2022-04-27 NOTE — Telephone Encounter (Signed)
Call received from patient to inform Dr. Marin Olp that his nose started bleeding on Friday night, 04/24/22 and continued until Saturday morning when he called EMS and was taken to the [facility] ER when nose bleeding finally stopped.  Dr. Marin Olp notified and no new orders received.

## 2022-04-29 ENCOUNTER — Other Ambulatory Visit: Payer: Self-pay

## 2022-04-29 DIAGNOSIS — I480 Paroxysmal atrial fibrillation: Secondary | ICD-10-CM

## 2022-04-29 MED ORDER — APIXABAN 2.5 MG PO TABS: 2.5000 mg | ORAL_TABLET | Freq: Two times a day (BID) | ORAL | 1 refills | Status: DC

## 2022-04-29 NOTE — Telephone Encounter (Signed)
Eliquis '5mg'$  refill request received. Patient is 81 years old, weight-114kg, Crea-2.15 on 04/24/2022, Diagnosis-Afib, and last seen by Dr. Bettina Gavia on 01/20/2022. Dose is appropriate based on dosing criteria. Will send in refill to requested pharmacy.

## 2022-05-15 ENCOUNTER — Other Ambulatory Visit: Payer: Medicare Other

## 2022-05-15 ENCOUNTER — Ambulatory Visit: Payer: Medicare Other

## 2022-05-27 ENCOUNTER — Other Ambulatory Visit: Payer: Self-pay | Admitting: Hematology & Oncology

## 2022-05-27 DIAGNOSIS — D461 Refractory anemia with ring sideroblasts: Secondary | ICD-10-CM

## 2022-05-27 DIAGNOSIS — D4620 Refractory anemia with excess of blasts, unspecified: Secondary | ICD-10-CM

## 2022-06-05 ENCOUNTER — Other Ambulatory Visit: Payer: Medicare Other

## 2022-06-05 ENCOUNTER — Ambulatory Visit: Payer: Medicare Other

## 2022-06-10 ENCOUNTER — Ambulatory Visit: Payer: Medicare Other | Admitting: Family

## 2022-06-10 ENCOUNTER — Inpatient Hospital Stay: Payer: Medicare Other

## 2022-06-10 ENCOUNTER — Ambulatory Visit: Payer: Medicare Other

## 2022-06-17 ENCOUNTER — Inpatient Hospital Stay: Payer: Medicare Other | Attending: Hematology & Oncology

## 2022-06-17 ENCOUNTER — Inpatient Hospital Stay (HOSPITAL_BASED_OUTPATIENT_CLINIC_OR_DEPARTMENT_OTHER): Payer: Medicare Other | Admitting: Family

## 2022-06-17 ENCOUNTER — Inpatient Hospital Stay: Payer: Medicare Other

## 2022-06-17 VITALS — BP 158/64 | HR 72 | Temp 97.7°F | Resp 17 | Ht 71.0 in | Wt 246.0 lb

## 2022-06-17 DIAGNOSIS — D461 Refractory anemia with ring sideroblasts: Secondary | ICD-10-CM | POA: Insufficient documentation

## 2022-06-17 DIAGNOSIS — D4620 Refractory anemia with excess of blasts, unspecified: Secondary | ICD-10-CM

## 2022-06-17 DIAGNOSIS — D518 Other vitamin B12 deficiency anemias: Secondary | ICD-10-CM

## 2022-06-17 DIAGNOSIS — D509 Iron deficiency anemia, unspecified: Secondary | ICD-10-CM

## 2022-06-17 DIAGNOSIS — Z79899 Other long term (current) drug therapy: Secondary | ICD-10-CM | POA: Diagnosis not present

## 2022-06-17 LAB — RETICULOCYTES
Immature Retic Fract: 15.2 % (ref 2.3–15.9)
RBC.: 3.36 MIL/uL — ABNORMAL LOW (ref 4.22–5.81)
Retic Count, Absolute: 164 10*3/uL (ref 19.0–186.0)
Retic Ct Pct: 4.9 % — ABNORMAL HIGH (ref 0.4–3.1)

## 2022-06-17 LAB — CBC WITH DIFFERENTIAL (CANCER CENTER ONLY)
Abs Immature Granulocytes: 0.07 10*3/uL (ref 0.00–0.07)
Basophils Absolute: 0 10*3/uL (ref 0.0–0.1)
Basophils Relative: 1 %
Eosinophils Absolute: 0.1 10*3/uL (ref 0.0–0.5)
Eosinophils Relative: 2 %
HCT: 30 % — ABNORMAL LOW (ref 39.0–52.0)
Hemoglobin: 10.2 g/dL — ABNORMAL LOW (ref 13.0–17.0)
Immature Granulocytes: 2 %
Lymphocytes Relative: 19 %
Lymphs Abs: 0.9 10*3/uL (ref 0.7–4.0)
MCH: 30.5 pg (ref 26.0–34.0)
MCHC: 34 g/dL (ref 30.0–36.0)
MCV: 89.8 fL (ref 80.0–100.0)
Monocytes Absolute: 0.3 10*3/uL (ref 0.1–1.0)
Monocytes Relative: 6 %
Neutro Abs: 3.4 10*3/uL (ref 1.7–7.7)
Neutrophils Relative %: 70 %
Platelet Count: 90 10*3/uL — ABNORMAL LOW (ref 150–400)
RBC: 3.34 MIL/uL — ABNORMAL LOW (ref 4.22–5.81)
RDW: 16 % — ABNORMAL HIGH (ref 11.5–15.5)
WBC Count: 4.8 10*3/uL (ref 4.0–10.5)
nRBC: 0 % (ref 0.0–0.2)

## 2022-06-17 LAB — CMP (CANCER CENTER ONLY)
ALT: 16 U/L (ref 0–44)
AST: 15 U/L (ref 15–41)
Albumin: 4.2 g/dL (ref 3.5–5.0)
Alkaline Phosphatase: 92 U/L (ref 38–126)
Anion gap: 9 (ref 5–15)
BUN: 49 mg/dL — ABNORMAL HIGH (ref 8–23)
CO2: 24 mmol/L (ref 22–32)
Calcium: 9.8 mg/dL (ref 8.9–10.3)
Chloride: 105 mmol/L (ref 98–111)
Creatinine: 2.3 mg/dL — ABNORMAL HIGH (ref 0.61–1.24)
GFR, Estimated: 28 mL/min — ABNORMAL LOW (ref >60)
Glucose, Bld: 291 mg/dL — ABNORMAL HIGH (ref 70–99)
Potassium: 4.5 mmol/L (ref 3.5–5.1)
Sodium: 138 mmol/L (ref 135–145)
Total Bilirubin: 2.3 mg/dL — ABNORMAL HIGH (ref 0.3–1.2)
Total Protein: 6.6 g/dL (ref 6.5–8.1)

## 2022-06-17 LAB — FERRITIN: Ferritin: 120 ng/mL (ref 24–336)

## 2022-06-17 MED ORDER — LUSPATERCEPT-AAMT 75 MG ~~LOC~~ SOLR
0.8700 mg/kg | Freq: Once | SUBCUTANEOUS | Status: AC
  Administered 2022-06-17: 100 mg via SUBCUTANEOUS
  Filled 2022-06-17: qty 1.5

## 2022-06-17 MED ORDER — DARBEPOETIN ALFA 150 MCG/0.3ML IJ SOSY
150.0000 ug | PREFILLED_SYRINGE | Freq: Once | INTRAMUSCULAR | Status: AC
  Administered 2022-06-17: 150 ug via SUBCUTANEOUS

## 2022-06-17 NOTE — Progress Notes (Signed)
Hematology and Oncology Follow Up Visit  Eric [name] [id number] August 04, 1941 81 y.o. 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 He is doing quite well and has no complaints at this time.   No c/o fatigue at this time.  He has not noted any blood loss. No bruising or petechiae.  No fever, chills, n/v, cough, rash, dizziness, SOB, chest pain, palpitations, abdominal pain or changes in bowel or bladder habits.  No swelling, tenderness, numbness or tingling in his extremities at this time.  No falls or syncope.  Appetite and hydration are good. Weight is 246 lbs.   ECOG Performance Status: 1 - Symptomatic but completely ambulatory  Medications:  Allergies as of 06/17/2022       Reactions   Iohexol Other (See Comments)    Desc: "shuts kidneys down"   Morphine And Related Shortness Of Breath   Hydrocodone Nausea And Vomiting   Sulfa Antibiotics Other (See Comments)   UNKNOWN REACTION   Sulfonamide Derivatives Other (See Comments)   UNKNOWN REACTION   Tramadol Rash   Trazodone Rash, Other (See Comments)   Pain   Trazodone And Nefazodone Rash   Pain        Medication List        Accurate as of June 17, 2022  4:02 PM. If you have any questions, ask your nurse or doctor.          acetaminophen 325 MG tablet Commonly known as: TYLENOL Take 650 mg by mouth every 6 (six) hours as needed for moderate pain.   allopurinol 100 MG tablet Commonly known as: ZYLOPRIM Take 100 mg by mouth daily.   apixaban 2.5 MG Tabs tablet Commonly known as: ELIQUIS Take 1 tablet (2.5 mg total) by mouth 2 (two) times daily.   insulin lispro 100 UNIT/ML KwikPen Commonly known as: HUMALOG Inject 20 Units into the skin 3 (three)  times daily.   levothyroxine 100 MCG tablet Commonly known as: SYNTHROID Take 50-100 mcg by mouth See admin instructions. Takes 100 mg daily except Sundays, on Sunday takes 50 mg   OneTouch Delica Lancets 99H Misc 4 (four) times daily.   silodosin 8 MG Caps capsule Commonly known as: RAPAFLO Take 8 mg by mouth daily.   simvastatin 20 MG tablet Commonly known as: ZOCOR Take 1 tablet (20 mg total) by mouth at bedtime.   spironolactone 50 MG tablet Commonly known as: ALDACTONE Take 1 tablet (50 mg total) by mouth daily.   torsemide 20 MG tablet Commonly known as: DEMADEX Take 1 tablet (20 mg total) by mouth 2 (two) times daily. Please only take a second pill on M-W-F   Toujeo SoloStar 300 UNIT/ML Solostar Pen Generic drug: insulin glargine (1 Unit Dial) Inject 46 Units into the skin daily. Sliding scale        Allergies:  Allergies  Allergen Reactions   Iohexol Other (See Comments)     Desc: "shuts kidneys down"    Morphine And Related Shortness Of Breath   Hydrocodone Nausea And Vomiting   Sulfa Antibiotics Other (See Comments)    UNKNOWN REACTION   Sulfonamide Derivatives Other (See Comments)    UNKNOWN REACTION   Tramadol Rash  Trazodone Rash and Other (See Comments)    Pain   Trazodone And Nefazodone Rash    Pain    Past Medical History, Surgical history, Social history, and Family History were reviewed and updated.  Review of Systems: All other 10 point review of systems is negative.   Physical Exam:  vitals were not taken for this visit.   Wt Readings from Last 3 Encounters:  06/17/22 246 lb (111.6 kg)  04/25/22 251 lb 5.2 oz (114 kg)  04/03/22 252 lb (114.3 kg)    Ocular: Sclerae unicteric, pupils equal, round and reactive to light Ear-nose-throat: Oropharynx clear, dentition fair Lymphatic: No cervical or supraclavicular adenopathy Lungs no rales or rhonchi, good excursion bilaterally Heart regular rate and rhythm, no murmur appreciated Abd  soft, nontender, positive bowel sounds MSK no focal spinal tenderness, no joint edema Neuro: non-focal, well-oriented, appropriate affect Breasts: Deferred   Lab Results  Component Value Date   WBC 4.8 06/17/2022   HGB 10.2 (L) 06/17/2022   HCT 30.0 (L) 06/17/2022   MCV 89.8 06/17/2022   PLT 90 (L) 06/17/2022   Lab Results  Component Value Date   FERRITIN 80 04/03/2022   IRON 82 04/03/2022   TIBC 307 04/03/2022   UIBC 225 04/03/2022   IRONPCTSAT 27 04/03/2022   Lab Results  Component Value Date   RETICCTPCT 4.9 (H) 06/17/2022   RBC 3.34 (L) 06/17/2022   RBC 3.36 (L) 06/17/2022   Lab Results  Component Value Date   KPAFRELGTCHN 49.7 (H) 02/17/2020   LAMBDASER 40.1 (H) 02/17/2020   KAPLAMBRATIO 6.90 04/30/2020   Lab Results  Component Value Date   IGGSERUM 1,267 04/29/2020   IGA 231 04/29/2020   IGMSERUM 36 04/29/2020   Lab Results  Component Value Date   TOTALPROTELP 6.5 04/29/2020   ALBUMINELP 2.9 02/17/2020   A1GS 0.4 02/17/2020   A2GS 0.7 02/17/2020   BETS 0.8 02/17/2020   GAMS 0.7 02/17/2020   MSPIKE 0.3 (H) 02/17/2020   SPEI Comment 02/17/2020     Chemistry      Component Value Date/Time   NA 138 06/17/2022 1448   K 4.5 06/17/2022 1448   CL 105 06/17/2022 1448   CO2 24 06/17/2022 1448   BUN 49 (H) 06/17/2022 1448   CREATININE 2.30 (H) 06/17/2022 1448      Component Value Date/Time   CALCIUM 9.8 06/17/2022 1448   ALKPHOS 92 06/17/2022 1448   AST 15 06/17/2022 1448   ALT 16 06/17/2022 1448   BILITOT 2.3 (H) 06/17/2022 1448       Impression and Plan: Eric [name] is a very pleasant 81 yo caucasian gentleman with myelodysplasia -- refractory anemia with ringed sideroblast.  ESA and Luspatercept given today for Hgb 10.2.  Lab check and injection every 3 weeks.  Follow-up in 12 weeks.   Lottie Dawson, NP 9/27/20234:02 PM

## 2022-06-17 NOTE — Patient Instructions (Signed)
Luspatercept Injection What is this medication? LUSPATERCEPT (lus PAT er sept) treats low levels of red blood cells (anemia) in the body in people with beta thalassemia or myelodysplastic syndromes. It works by helping the body make more red blood cells. This medicine may be used for other purposes; ask your health care provider or pharmacist if you have questions. COMMON BRAND NAME(S): REBLOZYL What should I tell my care team before I take this medication? They need to know if you have any of these conditions: Have had your spleen removed High blood pressure History of blood clots Tobacco use An unusual or allergic reaction to luspatercept, other medications, foods, dyes or preservatives Pregnant or trying to get pregnant Breast-feeding How should I use this medication? This medication is for injection under the skin. It is given by your care team in a hospital or clinic setting. Talk to your care team about the use of the medication in children. This medication is not approved for use in children. Overdosage: If you think you have taken too much of this medicine contact a poison control center or emergency room at once. NOTE: This medicine is only for you. Do not share this medicine with others. What if I miss a dose? Keep appointments for follow-up doses. It is important not to miss your dose. Call your care team if you are unable to keep an appointment. What may interact with this medication? Interactions are not expected. This list may not describe all possible interactions. Give your health care provider a list of all the medicines, herbs, non-prescription drugs, or dietary supplements you use. Also tell them if you smoke, drink alcohol, or use illegal drugs. Some items may interact with your medicine. What should I watch for while using this medication? Your condition will be monitored carefully while you are receiving this medication. Talk to your care team if you wish to become  pregnant or think you might be pregnant. This medication can cause serious birth defects. Discuss contraceptive options with your care team. Do not breastfeed while taking this medication. You may need blood work done while you are taking this medication. What side effects may I notice from receiving this medication? Side effects that you should report to your care team as soon as possible: Allergic reactions--skin rash, itching, hives, swelling of the face, lips, tongue, or throat Blood clot--pain, swelling, or warmth in the leg, shortness of breath, chest pain Increase in blood pressure Severe back pain, numbness or weakness of the hands, arms, legs, or feet, loss of coordination, loss of bowel or bladder control Side effects that usually do not require medical attention (report these to your care team if they continue or are bothersome): Bone pain Dizziness Fatigue Headache Joint pain Muscle pain Stomach pain This list may not describe all possible side effects. Call your doctor for medical advice about side effects. You may report side effects to FDA at 1-800-FDA-1088. Where should I keep my medication? This medication is given in a hospital or clinic. It will not be stored at home. NOTE: This sheet is a summary. It may not cover all possible information. If you have questions about this medicine, talk to your doctor, pharmacist, or health care provider.  2023 Elsevier/Gold Standard (2021-04-04 00:00:00) Epoetin Alfa Injection What is this medication? EPOETIN ALFA (e POE e tin AL fa) treats low levels of red blood cells (anemia) caused by kidney disease, chemotherapy, or HIV medications. It can also be used in people who are at risk for  blood loss during surgery. It works by Building control surveyor make more red blood cells, which reduces the need for blood transfusions. This medicine may be used for other purposes; ask your health care provider or pharmacist if you have questions. COMMON  BRAND NAME(S): Epogen, Procrit, Retacrit What should I tell my care team before I take this medication? They need to know if you have any of these conditions: Blood clots Cancer Heart disease High blood pressure On dialysis Seizures Stroke An unusual or allergic reaction to epoetin alfa, albumin, benzyl alcohol, other medications, foods, dyes, or preservatives Pregnant or trying to get pregnant Breast-feeding How should I use this medication? This medication is injected into a vein or under the skin. It is usually given by your care team in a hospital or clinic setting. It may also be given at home. If you get this medication at home, you will be taught how to prepare and give it. Use exactly as directed. Take it as directed on the prescription label at the same time every day. Keep taking it unless your care team tells you to stop. It is important that you put your used needles and syringes in a special sharps container. Do not put them in a trash can. If you do not have a sharps container, call your pharmacist or care team to get one. A special MedGuide will be given to you by the pharmacist with each prescription and refill. Be sure to read this information carefully each time. Talk to your care team about the use of this medication in children. While this medication may be used in children as young as 51 month of age for selected conditions, precautions do apply. Overdosage: If you think you have taken too much of this medicine contact a poison control center or emergency room at once. NOTE: This medicine is only for you. Do not share this medicine with others. What if I miss a dose? If you miss a dose, take it as soon as you can. If it is almost time for your next dose, take only that dose. Do not take double or extra doses. What may interact with this medication? Darbepoetin alfa Methoxy polyethylene glycol-epoetin beta This list may not describe all possible interactions. Give your  health care provider a list of all the medicines, herbs, non-prescription drugs, or dietary supplements you use. Also tell them if you smoke, drink alcohol, or use illegal drugs. Some items may interact with your medicine. What should I watch for while using this medication? Visit your care team for regular checks on your progress. Check your blood pressure as directed. Know what your blood pressure should be and when to contact your care team. Your condition will be monitored carefully while you are receiving this medication. You may need blood work while taking this medication. What side effects may I notice from receiving this medication? Side effects that you should report to your care team as soon as possible: Allergic reactions--skin rash, itching, hives, swelling of the face, lips, tongue, or throat Blood clot--pain, swelling, or warmth in the leg, shortness of breath, chest pain Heart attack--pain or tightness in the chest, shoulders, arms, or jaw, nausea, shortness of breath, cold or clammy skin, feeling faint or lightheaded Increase in blood pressure Rash, fever, and swollen lymph nodes Redness, blistering, peeling, or loosening of the skin, including inside the mouth Seizures Stroke--sudden numbness or weakness of the face, arm, or leg, trouble speaking, confusion, trouble walking, loss of balance or coordination,  dizziness, severe headache, change in vision Side effects that usually do not require medical attention (report to your care team if they continue or are bothersome): Bone, joint, or muscle pain Cough Headache Nausea Pain, redness, or irritation at injection site This list may not describe all possible side effects. Call your doctor for medical advice about side effects. You may report side effects to FDA at 1-800-FDA-1088. Where should I keep my medication? Keep out of the reach of children and pets. Store in a refrigerator. Do not freeze. Do not shake. Protect from  light. Keep this medication in the original container until you are ready to take it. See product for storage information. Get rid of any unused medication after the expiration date. To get rid of medications that are no longer needed or have expired: Take the medication to a medication take-back program. Check with your pharmacy or law enforcement to find a location. If you cannot return the medication, ask your pharmacist or care team how to get rid of the medication safely. NOTE: This sheet is a summary. It may not cover all possible information. If you have questions about this medicine, talk to your doctor, pharmacist, or health care provider.  2023 Elsevier/Gold Standard (2021-12-18 00:00:00)

## 2022-06-18 LAB — IRON AND IRON BINDING CAPACITY (CC-WL,HP ONLY)
Iron: 99 ug/dL (ref 45–182)
Saturation Ratios: 35 % (ref 17.9–39.5)
TIBC: 287 ug/dL (ref 250–450)
UIBC: 188 ug/dL (ref 117–376)

## 2022-06-24 ENCOUNTER — Telehealth: Payer: Self-pay | Admitting: *Deleted

## 2022-06-24 NOTE — Telephone Encounter (Signed)
Per 06/17/22 los - called and gave upcoming appointments - confirmed

## 2022-06-25 ENCOUNTER — Other Ambulatory Visit: Payer: Self-pay

## 2022-06-26 ENCOUNTER — Other Ambulatory Visit: Payer: Self-pay

## 2022-07-04 ENCOUNTER — Other Ambulatory Visit: Payer: Self-pay | Admitting: Cardiology

## 2022-07-06 ENCOUNTER — Ambulatory Visit: Payer: Medicare Other

## 2022-07-06 ENCOUNTER — Ambulatory Visit: Payer: Medicare Other | Admitting: Family

## 2022-07-06 ENCOUNTER — Other Ambulatory Visit: Payer: Medicare Other

## 2022-07-08 ENCOUNTER — Inpatient Hospital Stay: Payer: Medicare Other

## 2022-07-08 ENCOUNTER — Inpatient Hospital Stay: Payer: Medicare Other | Attending: Hematology & Oncology

## 2022-07-08 DIAGNOSIS — D4620 Refractory anemia with excess of blasts, unspecified: Secondary | ICD-10-CM

## 2022-07-08 DIAGNOSIS — D461 Refractory anemia with ring sideroblasts: Secondary | ICD-10-CM | POA: Insufficient documentation

## 2022-07-08 DIAGNOSIS — D509 Iron deficiency anemia, unspecified: Secondary | ICD-10-CM

## 2022-07-08 LAB — CMP (CANCER CENTER ONLY)
ALT: 14 U/L (ref 0–44)
AST: 13 U/L — ABNORMAL LOW (ref 15–41)
Albumin: 4.1 g/dL (ref 3.5–5.0)
Alkaline Phosphatase: 87 U/L (ref 38–126)
Anion gap: 8 (ref 5–15)
BUN: 39 mg/dL — ABNORMAL HIGH (ref 8–23)
CO2: 24 mmol/L (ref 22–32)
Calcium: 9.1 mg/dL (ref 8.9–10.3)
Chloride: 105 mmol/L (ref 98–111)
Creatinine: 2.22 mg/dL — ABNORMAL HIGH (ref 0.61–1.24)
GFR, Estimated: 29 mL/min — ABNORMAL LOW (ref >60)
Glucose, Bld: 353 mg/dL — ABNORMAL HIGH (ref 70–99)
Potassium: 4.9 mmol/L (ref 3.5–5.1)
Sodium: 137 mmol/L (ref 135–145)
Total Bilirubin: 2.3 mg/dL — ABNORMAL HIGH (ref 0.3–1.2)
Total Protein: 6.4 g/dL — ABNORMAL LOW (ref 6.5–8.1)

## 2022-07-08 LAB — CBC WITH DIFFERENTIAL (CANCER CENTER ONLY)
Abs Immature Granulocytes: 0.03 10*3/uL (ref 0.00–0.07)
Basophils Absolute: 0 10*3/uL (ref 0.0–0.1)
Basophils Relative: 1 %
Eosinophils Absolute: 0.1 10*3/uL (ref 0.0–0.5)
Eosinophils Relative: 2 %
HCT: 32.7 % — ABNORMAL LOW (ref 39.0–52.0)
Hemoglobin: 11 g/dL — ABNORMAL LOW (ref 13.0–17.0)
Immature Granulocytes: 1 %
Lymphocytes Relative: 24 %
Lymphs Abs: 1.3 10*3/uL (ref 0.7–4.0)
MCH: 30.3 pg (ref 26.0–34.0)
MCHC: 33.6 g/dL (ref 30.0–36.0)
MCV: 90.1 fL (ref 80.0–100.0)
Monocytes Absolute: 0.4 10*3/uL (ref 0.1–1.0)
Monocytes Relative: 7 %
Neutro Abs: 3.5 10*3/uL (ref 1.7–7.7)
Neutrophils Relative %: 65 %
Platelet Count: 122 10*3/uL — ABNORMAL LOW (ref 150–400)
RBC: 3.63 MIL/uL — ABNORMAL LOW (ref 4.22–5.81)
RDW: 16.3 % — ABNORMAL HIGH (ref 11.5–15.5)
WBC Count: 5.4 10*3/uL (ref 4.0–10.5)
nRBC: 0 % (ref 0.0–0.2)

## 2022-07-08 NOTE — Progress Notes (Signed)
Pt refused Luspatercept injection today with a HGB of 11.0.  He states that when he had the Luspatercept when his HGB was 11.0 on 04/24/22 that he felt "horrible" for one week and that he will not take Luspatercept again unless his HGB is less than 11.0.  Pharmacy notified.

## 2022-07-09 ENCOUNTER — Other Ambulatory Visit: Payer: Self-pay

## 2022-07-29 ENCOUNTER — Inpatient Hospital Stay: Payer: Medicare Other

## 2022-07-29 ENCOUNTER — Inpatient Hospital Stay: Payer: Medicare Other | Attending: Hematology & Oncology

## 2022-07-29 VITALS — BP 143/80 | HR 65 | Temp 97.7°F | Resp 17

## 2022-07-29 DIAGNOSIS — D461 Refractory anemia with ring sideroblasts: Secondary | ICD-10-CM | POA: Diagnosis not present

## 2022-07-29 DIAGNOSIS — D4620 Refractory anemia with excess of blasts, unspecified: Secondary | ICD-10-CM

## 2022-07-29 DIAGNOSIS — Z79899 Other long term (current) drug therapy: Secondary | ICD-10-CM | POA: Diagnosis not present

## 2022-07-29 DIAGNOSIS — D509 Iron deficiency anemia, unspecified: Secondary | ICD-10-CM

## 2022-07-29 DIAGNOSIS — D518 Other vitamin B12 deficiency anemias: Secondary | ICD-10-CM

## 2022-07-29 LAB — CMP (CANCER CENTER ONLY)
ALT: 14 U/L (ref 0–44)
AST: 15 U/L (ref 15–41)
Albumin: 4.2 g/dL (ref 3.5–5.0)
Alkaline Phosphatase: 93 U/L (ref 38–126)
Anion gap: 8 (ref 5–15)
BUN: 53 mg/dL — ABNORMAL HIGH (ref 8–23)
CO2: 23 mmol/L (ref 22–32)
Calcium: 9.4 mg/dL (ref 8.9–10.3)
Chloride: 105 mmol/L (ref 98–111)
Creatinine: 2.13 mg/dL — ABNORMAL HIGH (ref 0.61–1.24)
GFR, Estimated: 31 mL/min — ABNORMAL LOW (ref >60)
Glucose, Bld: 427 mg/dL — ABNORMAL HIGH (ref 70–99)
Potassium: 4.7 mmol/L (ref 3.5–5.1)
Sodium: 136 mmol/L (ref 135–145)
Total Bilirubin: 2.6 mg/dL — ABNORMAL HIGH (ref 0.3–1.2)
Total Protein: 6.6 g/dL (ref 6.5–8.1)

## 2022-07-29 LAB — CBC WITH DIFFERENTIAL (CANCER CENTER ONLY)
Abs Immature Granulocytes: 0.02 10*3/uL (ref 0.00–0.07)
Basophils Absolute: 0 10*3/uL (ref 0.0–0.1)
Basophils Relative: 1 %
Eosinophils Absolute: 0.1 10*3/uL (ref 0.0–0.5)
Eosinophils Relative: 2 %
HCT: 30.7 % — ABNORMAL LOW (ref 39.0–52.0)
Hemoglobin: 10.3 g/dL — ABNORMAL LOW (ref 13.0–17.0)
Immature Granulocytes: 0 %
Lymphocytes Relative: 22 %
Lymphs Abs: 1.1 10*3/uL (ref 0.7–4.0)
MCH: 30 pg (ref 26.0–34.0)
MCHC: 33.6 g/dL (ref 30.0–36.0)
MCV: 89.5 fL (ref 80.0–100.0)
Monocytes Absolute: 0.3 10*3/uL (ref 0.1–1.0)
Monocytes Relative: 7 %
Neutro Abs: 3.3 10*3/uL (ref 1.7–7.7)
Neutrophils Relative %: 68 %
Platelet Count: 116 10*3/uL — ABNORMAL LOW (ref 150–400)
RBC: 3.43 MIL/uL — ABNORMAL LOW (ref 4.22–5.81)
RDW: 16 % — ABNORMAL HIGH (ref 11.5–15.5)
WBC Count: 4.8 10*3/uL (ref 4.0–10.5)
nRBC: 0 % (ref 0.0–0.2)

## 2022-07-29 MED ORDER — LUSPATERCEPT-AAMT 75 MG ~~LOC~~ SOLR
0.8700 mg/kg | Freq: Once | SUBCUTANEOUS | Status: AC
  Administered 2022-07-29: 100 mg via SUBCUTANEOUS
  Filled 2022-07-29: qty 1.5

## 2022-07-29 MED ORDER — DARBEPOETIN ALFA 150 MCG/0.3ML IJ SOSY
150.0000 ug | PREFILLED_SYRINGE | Freq: Once | INTRAMUSCULAR | Status: AC
  Administered 2022-07-29: 150 ug via SUBCUTANEOUS

## 2022-07-29 NOTE — Patient Instructions (Addendum)
Luspatercept Injection What is this medication? LUSPATERCEPT (lus PAT er sept) treats low levels of red blood cells (anemia) in the body in people with beta thalassemia or myelodysplastic syndromes. It works by helping the body make more red blood cells. This medicine may be used for other purposes; ask your health care provider or pharmacist if you have questions. COMMON BRAND NAME(S): REBLOZYL What should I tell my care team before I take this medication? They need to know if you have any of these conditions: Have had your spleen removed High blood pressure History of blood clots Tobacco use An unusual or allergic reaction to luspatercept, other medications, foods, dyes or preservatives Pregnant or trying to get pregnant Breast-feeding How should I use this medication? This medication is for injection under the skin. It is given by your care team in a hospital or clinic setting. Talk to your care team about the use of the medication in children. This medication is not approved for use in children. Overdosage: If you think you have taken too much of this medicine contact a poison control center or emergency room at once. NOTE: This medicine is only for you. Do not share this medicine with others. What if I miss a dose? Keep appointments for follow-up doses. It is important not to miss your dose. Call your care team if you are unable to keep an appointment. What may interact with this medication? Interactions are not expected. This list may not describe all possible interactions. Give your health care provider a list of all the medicines, herbs, non-prescription drugs, or dietary supplements you use. Also tell them if you smoke, drink alcohol, or use illegal drugs. Some items may interact with your medicine. What should I watch for while using this medication? Your condition will be monitored carefully while you are receiving this medication. Talk to your care team if you wish to become  pregnant or think you might be pregnant. This medication can cause serious birth defects. Discuss contraceptive options with your care team. Do not breastfeed while taking this medication. You may need blood work done while you are taking this medication. What side effects may I notice from receiving this medication? Side effects that you should report to your care team as soon as possible: Allergic reactions--skin rash, itching, hives, swelling of the face, lips, tongue, or throat Blood clot--pain, swelling, or warmth in the leg, shortness of breath, chest pain Increase in blood pressure Severe back pain, numbness or weakness of the hands, arms, legs, or feet, loss of coordination, loss of bowel or bladder control Side effects that usually do not require medical attention (report these to your care team if they continue or are bothersome): Bone pain Dizziness Fatigue Headache Joint pain Muscle pain Stomach pain This list may not describe all possible side effects. Call your doctor for medical advice about side effects. You may report side effects to FDA at 1-800-FDA-1088. Where should I keep my medication? This medication is given in a hospital or clinic. It will not be stored at home. NOTE: This sheet is a summary. It may not cover all possible information. If you have questions about this medicine, talk to your doctor, pharmacist, or health care provider.  2023 Elsevier/Gold Standard (2021-04-04 00:00:00) Epoetin Alfa Injection What is this medication? EPOETIN ALFA (e POE e tin AL fa) treats low levels of red blood cells (anemia) caused by kidney disease, chemotherapy, or HIV medications. It can also be used in people who are at risk for  blood loss during surgery. It works by Building control surveyor make more red blood cells, which reduces the need for blood transfusions. This medicine may be used for other purposes; ask your health care provider or pharmacist if you have questions. COMMON  BRAND NAME(S): Epogen, Procrit, Retacrit What should I tell my care team before I take this medication? They need to know if you have any of these conditions: Blood clots Cancer Heart disease High blood pressure On dialysis Seizures Stroke An unusual or allergic reaction to epoetin alfa, albumin, benzyl alcohol, other medications, foods, dyes, or preservatives Pregnant or trying to get pregnant Breast-feeding How should I use this medication? This medication is injected into a vein or under the skin. It is usually given by your care team in a hospital or clinic setting. It may also be given at home. If you get this medication at home, you will be taught how to prepare and give it. Use exactly as directed. Take it as directed on the prescription label at the same time every day. Keep taking it unless your care team tells you to stop. It is important that you put your used needles and syringes in a special sharps container. Do not put them in a trash can. If you do not have a sharps container, call your pharmacist or care team to get one. A special MedGuide will be given to you by the pharmacist with each prescription and refill. Be sure to read this information carefully each time. Talk to your care team about the use of this medication in children. While this medication may be used in children as young as 51 month of age for selected conditions, precautions do apply. Overdosage: If you think you have taken too much of this medicine contact a poison control center or emergency room at once. NOTE: This medicine is only for you. Do not share this medicine with others. What if I miss a dose? If you miss a dose, take it as soon as you can. If it is almost time for your next dose, take only that dose. Do not take double or extra doses. What may interact with this medication? Darbepoetin alfa Methoxy polyethylene glycol-epoetin beta This list may not describe all possible interactions. Give your  health care provider a list of all the medicines, herbs, non-prescription drugs, or dietary supplements you use. Also tell them if you smoke, drink alcohol, or use illegal drugs. Some items may interact with your medicine. What should I watch for while using this medication? Visit your care team for regular checks on your progress. Check your blood pressure as directed. Know what your blood pressure should be and when to contact your care team. Your condition will be monitored carefully while you are receiving this medication. You may need blood work while taking this medication. What side effects may I notice from receiving this medication? Side effects that you should report to your care team as soon as possible: Allergic reactions--skin rash, itching, hives, swelling of the face, lips, tongue, or throat Blood clot--pain, swelling, or warmth in the leg, shortness of breath, chest pain Heart attack--pain or tightness in the chest, shoulders, arms, or jaw, nausea, shortness of breath, cold or clammy skin, feeling faint or lightheaded Increase in blood pressure Rash, fever, and swollen lymph nodes Redness, blistering, peeling, or loosening of the skin, including inside the mouth Seizures Stroke--sudden numbness or weakness of the face, arm, or leg, trouble speaking, confusion, trouble walking, loss of balance or coordination,  dizziness, severe headache, change in vision Side effects that usually do not require medical attention (report to your care team if they continue or are bothersome): Bone, joint, or muscle pain Cough Headache Nausea Pain, redness, or irritation at injection site This list may not describe all possible side effects. Call your doctor for medical advice about side effects. You may report side effects to FDA at 1-800-FDA-1088. Where should I keep my medication? Keep out of the reach of children and pets.Darbepoetin Alfa Injection What is this medication? DARBEPOETIN ALFA  (dar be POE e tin AL fa) treats low levels of red blood cells (anemia) caused by kidney disease or chemotherapy. It works by Building control surveyor make more red blood cells, which reduces the need for blood transfusions. This medicine may be used for other purposes; ask your health care provider or pharmacist if you have questions. COMMON BRAND NAME(S): Aranesp What should I tell my care team before I take this medication? They need to know if you have any of these conditions: Blood clots Cancer Heart disease High blood pressure On dialysis Seizures Stroke An unusual or allergic reaction to darbepoetin, latex, other medications, foods, dyes, or preservatives Pregnant or trying to get pregnant Breast-feeding How should I use this medication? This medication is injected into a vein or under the skin. It is usually given by a care team in a hospital or clinic setting. It may also be given at home. If you get this medication at home, you will be taught how to prepare and give it. Use exactly as directed. Take it as directed on the prescription label at the same time every day. Keep taking it unless your care team tells you to stop. It is important that you put your used needles and syringes in a special sharps container. Do not put them in a trash can. If you do not have a sharps container, call your pharmacist or care team to get one. A special MedGuide will be given to you by the pharmacist with each prescription and refill. Be sure to read this information carefully each time. Talk to your care team about the use of this medication in children. While this medication may be used in children as young as 33 month of age for selected conditions, precautions do apply. Overdosage: If you think you have taken too much of this medicine contact a poison control center or emergency room at once. NOTE: This medicine is only for you. Do not share this medicine with others. What if I miss a dose? If you miss a  dose, take it as soon as you can. If it is almost time for your next dose, take only that dose. Do not take double or extra doses. What may interact with this medication? Epoetin alfa Methoxy polyethylene glycol-epoetin beta This list may not describe all possible interactions. Give your health care provider a list of all the medicines, herbs, non-prescription drugs, or dietary supplements you use. Also tell them if you smoke, drink alcohol, or use illegal drugs. Some items may interact with your medicine. What should I watch for while using this medication? Visit your care team for regular checks on your progress. Check your blood pressure as directed. Know what your blood pressure should be and when to contact your care team. Your condition will be monitored carefully while you are receiving this medication. You may need blood work while taking this medication. What side effects may I notice from receiving this medication? Side effects that  you should report to your care team as soon as possible: Allergic reactions--skin rash, itching, hives, swelling of the face, lips, tongue, or throat Blood clot--pain, swelling, or warmth in the leg, shortness of breath, chest pain Heart attack--pain or tightness in the chest, shoulders, arms, or jaw, nausea, shortness of breath, cold or clammy skin, feeling faint or lightheaded Increase in blood pressure Rash, fever, and swollen lymph nodes Redness, blistering, peeling, or loosening of the skin, including inside the mouth Seizures Stroke--sudden numbness or weakness of the face, arm, or leg, trouble speaking, confusion, trouble walking, loss of balance or coordination, dizziness, severe headache, change in vision Side effects that usually do not require medical attention (report to your care team if they continue or are bothersome): Cough Stomach pain Swelling of the ankles, hands, or feet This list may not describe all possible side effects. Call your  doctor for medical advice about side effects. You may report side effects to FDA at 1-800-FDA-1088. Where should I keep my medication? Keep out of the reach of children and pets. Store in a refrigerator. Do not freeze. Do not shake. Protect from light. Keep this medication in the original container until you are ready to take it. See product for storage information. Get rid of any unused medication after the expiration date. To get rid of medications that are no longer needed or have expired: Take the medication to a medication take-back program. Check with your pharmacy or law enforcement to find a location. If you cannot return the medication, ask your pharmacist or care team how to get rid of the medication safely. NOTE: This sheet is a summary. It may not cover all possible information. If you have questions about this medicine, talk to your doctor, pharmacist, or health care provider.  2023 [employer]/Gold Standard (2021-12-16 00:00:00)  Store in a refrigerator. Do not freeze. Do not shake. Protect from light. Keep this medication in the original container until you are ready to take it. See product for storage information. Get rid of any unused medication after the expiration date. To get rid of medications that are no longer needed or have expired: Take the medication to a medication take-back program. Check with your pharmacy or law enforcement to find a location. If you cannot return the medication, ask your pharmacist or care team how to get rid of the medication safely. NOTE: This sheet is a summary. It may not cover all possible information. If you have questions about this medicine, talk to your doctor, pharmacist, or health care provider.  2023 Elsevier/Gold Standard (2021-12-18 00:00:00)

## 2022-08-19 ENCOUNTER — Inpatient Hospital Stay: Payer: Medicare Other

## 2022-08-31 NOTE — Progress Notes (Signed)
HPI: Follow-up atrial fibrillation.  Previously followed by Dr. Bettina Gavia but transitioning to me.  He has a history of permanent atrial fibrillation treated with rate control and anticoagulation.  Echocardiogram May 2021 showed normal LV function, mild right ventricular enlargement, biatrial enlargement, mild aortic stenosis with mean gradient 18 mmHg and aortic valve area 1.71 cm, small pericardial effusion.  ABIs May 2021 normal on the right and abnormal TBI on the left.  Diuretic increased at last office visit due to worsening dyspnea.  Since last seen he has some dyspnea on exertion unchanged.  No orthopnea or PND.  Mild pedal edema.  He denies chest pain or syncope.  Occasional "dizzy spell" for 30 to 45 seconds.  Current Outpatient Medications  Medication Sig Dispense Refill   acetaminophen (TYLENOL) 325 MG tablet Take 650 mg by mouth every 6 (six) hours as needed for moderate pain.     allopurinol (ZYLOPRIM) 100 MG tablet Take 100 mg by mouth daily.     apixaban (ELIQUIS) 2.5 MG TABS tablet Take 1 tablet (2.5 mg total) by mouth 2 (two) times daily. 180 tablet 1   insulin glargine, 1 Unit Dial, (TOUJEO SOLOSTAR) 300 UNIT/ML Solostar Pen Inject 46 Units into the skin daily. Sliding scale     insulin lispro (HUMALOG) 100 UNIT/ML KwikPen Inject 20 Units into the skin 3 (three) times daily.     levothyroxine (SYNTHROID) 100 MCG tablet Take 50-100 mcg by mouth See admin instructions. Takes 100 mg daily except Sundays, on Sunday takes 50 mg     OneTouch Delica Lancets 96E MISC 4 (four) times daily.     silodosin (RAPAFLO) 8 MG CAPS capsule Take 8 mg by mouth daily.      simvastatin (ZOCOR) 20 MG tablet Take 1 tablet (20 mg total) by mouth at bedtime. 90 tablet 1   spironolactone (ALDACTONE) 50 MG tablet Take 1 tablet (50 mg total) by mouth daily. 90 tablet 1   torsemide (DEMADEX) 20 MG tablet Take 1 tablet (20 mg total) by mouth 2 (two) times daily. Please only take a second pill on M-W-F 90  tablet 3   No current facility-administered medications for this visit.     Past Medical History:  Diagnosis Date   Abnormal results of liver function studies 04/24/2014   Acquired hypothyroidism 04/24/2014   Acute on chronic diastolic heart failure ([place]) 02/12/2020   5/24-5/31/2021 acute on chronic heart failure in the context of chronic venous insufficiency and acute on CKD.  IV diuresis resulted in output of 6.9 L.   AKI (acute kidney injury) ([place]) 06/29/2019   Anemia    Atrial fibrillation ([place]) 01/15/2009   Qualifier: Diagnosis of  By: Lynnell Jude RN BSN, [employer] of this note might be different from the original. Overview:  Qualifier: Diagnosis of  By: Lynnell Jude RN BSN, [employer] of this note might be different from the original. 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  Overview:  EF 50-55%  Overview:  EF 50-55% Formatting of this note might be different from the original. EF 50-55%   Chronic atrial fibrillation ([place])    Chronic coronary artery disease 04/24/2014   Formatting of this note might be different from the original. Overview:  Cath 2003 [place]. Moderate - LAD 60% Formatting of this note might be different from the original. Cath 2003 [facility]. Moderate - LAD 60%   Chronic kidney disease, stage 3b ([place]) 06/28/2018   Colon polyp 07/08/2018   Current use of long term anticoagulation 10/12/2018   Degenerative arthritis of knee, bilateral  06/06/2014   Diabetes with neurologic complications ([place]) 37/62/8315   Diabetes, polyneuropathy ([place]) 17/61/6073   Diastolic heart failure ([place])    DIASTOLIC HEART FAILURE, CHRONIC 02/07/2009   Qualifier: Diagnosis of  By: Caryl Comes, MD, [facility], Mack Guise   Formatting of this note might be different from the original. Overview:  Overview:  Qualifier: Diagnosis of  By: Caryl Comes, MD, [facility], Mack Guise Formatting of this note might be different from the original. 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 The test differentiates between high school or less education level in reference to presence or absence of dementia. For high school education a score of 27-30 is normal, 21-26 is minimal neurocognitive deficit and 1-20 suggests dementia. With less than a high school education similar sco   Obesity 02/06/2009   Qualifier: Diagnosis of  By: Hollie Salk CMA, Monika Salk of this note might be different from the original. 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 Formatting of this note might be different from the original. Qualifier: Diagnosis of  By: Hollie Salk CMA, [place]:  Formatting of this note might be different from the original. 02/12/2020 A1c 8.4% which is adequate control with age & advanced co-morbidities Glucos   Shingles    Skin lesion of left leg 03/21/2014   Tendinitis of right shoulder 07/18/2014   Thoracic or lumbosacral neuritis or radiculitis, unspecified 04/24/2014   Formatting of this note might be  different from the original. Possible right L5 or S1   Thrombocytopenia ([name]) 02/20/2020   Platelet count varied from 73,000 up to 102,000 as inpatient.  Hematology outpatient consultation on 02/29/2020.   Formatting of this note might be different from the original. Formatting of this note might be different from the original. Platelet count varied from 73,000 up to 102,000 as inpatient.  Hematology outpatient consultation on 02/29/2020.   Last Assessment & Plan:  Formatting of this note   Type 2 diabetes mellitus with stage 3b chronic kidney disease, with long-term current use of insulin ([place]) 02/25/2013   Venous insufficiency 02/19/2020   Formatting of this note might be different from the original. Last Assessment & Plan:  Formatting of this note might be different from the original. Unna boots will be continued at the SNF under the supervision of the wound care nurse.  Outpatient follow-up clinic in the vein clinic will  be arranged. It is anticipated that the ABIs will be reattempted ;results were incomplete during hospitalizatio   Vitamin B12 deficiency 09/26/2014    Past Surgical History:  Procedure Laterality Date   APPENDECTOMY     CHOLECYSTECTOMY     TOTAL HIP ARTHROPLASTY      Social History   Socioeconomic History   Marital status: Married    Spouse name: Not on file   Number of children: Not on file   Years of education: Not on file   Highest education level: Not on file  Occupational History   Not on file  Tobacco Use   Smoking status: Never   Smokeless tobacco: Never  Vaping Use   Vaping Use: Never used  Substance and Sexual Activity   Alcohol use: Never   Drug use: Never   Sexual activity: Yes    Partners: Female  Other Topics Concern   Not on file  Social History Narrative   Not on file   Social Determinants of Health   Financial Resource Strain: Not on file  Food Insecurity: Not on file  Transportation Needs: Not on file  Physical Activity: Not on file  Stress: Not on file  Social Connections: Not on file  Intimate Partner Violence: Not on file    Family History  Problem Relation Age of Onset   Heart disease Mother    Congestive Heart Failure Mother    Alzheimer's disease Father     ROS: no fevers or chills, productive cough, hemoptysis, dysphasia, odynophagia, melena, hematochezia, dysuria, hematuria, rash, seizure activity, orthopnea, PND, pedal edema, claudication. Remaining systems are negative.  Physical Exam: Well-developed well-nourished in no acute distress.  Skin is warm and dry.  HEENT is normal.  Neck is supple.  Chest is clear to auscultation with normal expansion.  Cardiovascular exam is irregular, 2/6 systolic murmur left sternal border. Abdominal exam nontender or distended. No masses palpated. Extremities show trace edema. neuro grossly intact  ECG-atrial fibrillation at a rate of 71, normal axis, no ST changes.  Personally  reviewed  A/P  1 permanent atrial fibrillation-rate is controlled on no medications.  Note he does have occasional "dizzy spells" for 45 seconds.  If these worsen we will consider monitor to make sure that he is not having bradycardia mediated events.  Continue apixaban.  2 history of aortic stenosis-mild on most recent study.  Will plan repeat echocardiogram.  3 chronic diastolic congestive heart failure-patient has volume excess likely secondary to chronic diastolic dysfunction as well as renal insufficiency.  He appears to be euvolemic today.  Will continue diuretics at present dose.  4 hypertension-patient's blood pressure is controlled.  Continue present medications.  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  Kirk Ruths, MD

## 2022-09-09 ENCOUNTER — Encounter: Payer: Self-pay | Admitting: Cardiology

## 2022-09-09 ENCOUNTER — Ambulatory Visit: Payer: Medicare Other | Attending: Cardiology | Admitting: Cardiology

## 2022-09-09 VITALS — BP 128/74 | HR 71 | Ht 72.0 in | Wt 247.8 lb

## 2022-09-09 DIAGNOSIS — I4819 Other persistent atrial fibrillation: Secondary | ICD-10-CM

## 2022-09-09 DIAGNOSIS — I350 Nonrheumatic aortic (valve) stenosis: Secondary | ICD-10-CM | POA: Diagnosis not present

## 2022-09-09 NOTE — Patient Instructions (Signed)
  Testing/Procedures:  Your physician has requested that you have an echocardiogram. Echocardiography is a painless test that uses sound waves to create images of your heart. It provides your doctor with information about the size and shape of your heart and how well your heart's chambers and valves are working. This procedure takes approximately one hour. There are no restrictions for this procedure. Please do NOT wear cologne, perfume, aftershave, or lotions (deodorant is allowed). Please arrive 15 minutes prior to your appointment time. [facility] OFF[facility]-1ST FLOOR IMAGING DEPARTMENT   Follow-Up: At [facility], you and your health needs are our priority.  As part of our continuing mission to provide you with exceptional heart care, we have created designated Provider Care Teams.  These Care Teams include your primary Cardiologist (physician) and Advanced Practice Providers (APPs -  Physician Assistants and Nurse Practitioners) who all work together to provide you with the care you need, when you need it.  We recommend signing up for the patient portal called "MyChart".  Sign up information is provided on this After Visit Summary.  MyChart is used to connect with patients for Virtual Visits (Telemedicine).  Patients are able to view lab/test results, encounter notes, upcoming appointments, etc.  Non-urgent messages can be sent to your provider as well.   To learn more about what you can do with MyChart, go to [email or web address].    Your next appointment:   6 month(s)  The format for your next appointment:   In Person  Provider:   Kirk Ruths, MD

## 2022-09-10 ENCOUNTER — Other Ambulatory Visit: Payer: Self-pay

## 2022-09-15 ENCOUNTER — Telehealth: Payer: Self-pay | Admitting: Cardiology

## 2022-09-15 NOTE — Telephone Encounter (Signed)
This is [facility] pt. Sent to wrong pool

## 2022-09-15 NOTE — Telephone Encounter (Signed)
Pt's wife would like a call back to discuss patients care while patient is in hospital.

## 2022-09-15 NOTE — Telephone Encounter (Signed)
Pt spouse calling back because she wanted to inform Reuben Likes, RN that pt is now having an echo at [facility]

## 2022-09-15 NOTE — Telephone Encounter (Signed)
Spoke with Doti who states that her husband saw Dr. Stanford Breed last week for the first time. Doti states that he husband is currently a patient at [facility] after he had a fall. Doti states that the doctors there says his heart is running away and they feel he needs either a pacemaker or loop recorder. Doti was requesting Dr. Jacalyn Lefevre input. Advised that Dr. Stanford Breed is currently out of the office. I told her that cardiologist will do what is best and once discharged Dr. Stanford Breed can monitor her husband and what device he has.

## 2022-09-16 ENCOUNTER — Telehealth: Payer: Self-pay | Admitting: Cardiology

## 2022-09-16 ENCOUNTER — Other Ambulatory Visit: Payer: Medicare Other

## 2022-09-16 ENCOUNTER — Ambulatory Visit: Payer: Medicare Other | Admitting: Family

## 2022-09-16 ENCOUNTER — Ambulatory Visit: Payer: Medicare Other

## 2022-09-16 NOTE — Telephone Encounter (Signed)
Wife called stating patient has been in hospital since Saturday (12/23).  Wife stated patient had Echocardiogram while in hospital and his test scheduled on 1/4 should be canceled.  Wife would like a call back to confirm test has been canceled, can leave VM.

## 2022-09-16 NOTE — Telephone Encounter (Signed)
FYI Appointment cancelled as pt had echo while in [facility]. Doti states that the pt is getting a pacemaker.

## 2022-09-21 ENCOUNTER — Encounter: Payer: Self-pay | Admitting: Family

## 2022-09-21 ENCOUNTER — Encounter: Payer: Self-pay | Admitting: Cardiology

## 2022-09-22 ENCOUNTER — Inpatient Hospital Stay: Payer: Medicare Other

## 2022-09-22 ENCOUNTER — Other Ambulatory Visit: Payer: Self-pay | Admitting: *Deleted

## 2022-09-22 ENCOUNTER — Other Ambulatory Visit: Payer: Self-pay

## 2022-09-22 ENCOUNTER — Inpatient Hospital Stay (HOSPITAL_BASED_OUTPATIENT_CLINIC_OR_DEPARTMENT_OTHER): Payer: Medicare Other | Admitting: Medical Oncology

## 2022-09-22 ENCOUNTER — Encounter: Payer: Self-pay | Admitting: Cardiology

## 2022-09-22 ENCOUNTER — Encounter: Payer: Self-pay | Admitting: Family

## 2022-09-22 ENCOUNTER — Inpatient Hospital Stay: Payer: Medicare Other | Attending: Hematology & Oncology

## 2022-09-22 ENCOUNTER — Encounter: Payer: Self-pay | Admitting: Medical Oncology

## 2022-09-22 VITALS — BP 132/58 | HR 61 | Temp 97.7°F | Resp 19 | Ht 72.0 in | Wt 237.8 lb

## 2022-09-22 DIAGNOSIS — Z79899 Other long term (current) drug therapy: Secondary | ICD-10-CM | POA: Diagnosis not present

## 2022-09-22 DIAGNOSIS — D518 Other vitamin B12 deficiency anemias: Secondary | ICD-10-CM

## 2022-09-22 DIAGNOSIS — D4620 Refractory anemia with excess of blasts, unspecified: Secondary | ICD-10-CM

## 2022-09-22 DIAGNOSIS — D509 Iron deficiency anemia, unspecified: Secondary | ICD-10-CM | POA: Diagnosis not present

## 2022-09-22 DIAGNOSIS — D461 Refractory anemia with ring sideroblasts: Secondary | ICD-10-CM | POA: Diagnosis present

## 2022-09-22 LAB — IRON AND IRON BINDING CAPACITY (CC-WL,HP ONLY)
Iron: 91 ug/dL (ref 45–182)
Saturation Ratios: 27 % (ref 17.9–39.5)
TIBC: 333 ug/dL (ref 250–450)
UIBC: 242 ug/dL (ref 117–376)

## 2022-09-22 LAB — CBC WITH DIFFERENTIAL (CANCER CENTER ONLY)
Abs Immature Granulocytes: 0.02 10*3/uL (ref 0.00–0.07)
Basophils Absolute: 0.1 10*3/uL (ref 0.0–0.1)
Basophils Relative: 1 %
Eosinophils Absolute: 0.2 10*3/uL (ref 0.0–0.5)
Eosinophils Relative: 3 %
HCT: 30.6 % — ABNORMAL LOW (ref 39.0–52.0)
Hemoglobin: 10.4 g/dL — ABNORMAL LOW (ref 13.0–17.0)
Immature Granulocytes: 0 %
Lymphocytes Relative: 23 %
Lymphs Abs: 1.5 10*3/uL (ref 0.7–4.0)
MCH: 30.5 pg (ref 26.0–34.0)
MCHC: 34 g/dL (ref 30.0–36.0)
MCV: 89.7 fL (ref 80.0–100.0)
Monocytes Absolute: 0.6 10*3/uL (ref 0.1–1.0)
Monocytes Relative: 10 %
Neutro Abs: 4.1 10*3/uL (ref 1.7–7.7)
Neutrophils Relative %: 63 %
Platelet Count: 132 10*3/uL — ABNORMAL LOW (ref 150–400)
RBC: 3.41 MIL/uL — ABNORMAL LOW (ref 4.22–5.81)
RDW: 16.6 % — ABNORMAL HIGH (ref 11.5–15.5)
WBC Count: 6.4 10*3/uL (ref 4.0–10.5)
nRBC: 0 % (ref 0.0–0.2)

## 2022-09-22 LAB — CMP (CANCER CENTER ONLY)
ALT: 17 U/L (ref 0–44)
AST: 16 U/L (ref 15–41)
Albumin: 4.5 g/dL (ref 3.5–5.0)
Alkaline Phosphatase: 77 U/L (ref 38–126)
Anion gap: 9 (ref 5–15)
BUN: 88 mg/dL — ABNORMAL HIGH (ref 8–23)
CO2: 23 mmol/L (ref 22–32)
Calcium: 9.7 mg/dL (ref 8.9–10.3)
Chloride: 106 mmol/L (ref 98–111)
Creatinine: 2.78 mg/dL — ABNORMAL HIGH (ref 0.61–1.24)
GFR, Estimated: 22 mL/min — ABNORMAL LOW (ref >60)
Glucose, Bld: 165 mg/dL — ABNORMAL HIGH (ref 70–99)
Potassium: 5 mmol/L (ref 3.5–5.1)
Sodium: 138 mmol/L (ref 135–145)
Total Bilirubin: 2.1 mg/dL — ABNORMAL HIGH (ref 0.3–1.2)
Total Protein: 7.1 g/dL (ref 6.5–8.1)

## 2022-09-22 LAB — FERRITIN: Ferritin: 176 ng/mL (ref 24–336)

## 2022-09-22 LAB — RETICULOCYTES
Immature Retic Fract: 19.4 % — ABNORMAL HIGH (ref 2.3–15.9)
RBC.: 3.43 MIL/uL — ABNORMAL LOW (ref 4.22–5.81)
Retic Count, Absolute: 191.1 10*3/uL — ABNORMAL HIGH (ref 19.0–186.0)
Retic Ct Pct: 5.6 % — ABNORMAL HIGH (ref 0.4–3.1)

## 2022-09-22 MED ORDER — DARBEPOETIN ALFA 150 MCG/0.3ML IJ SOSY
150.0000 ug | PREFILLED_SYRINGE | Freq: Once | INTRAMUSCULAR | Status: AC
  Administered 2022-09-22: 150 ug via SUBCUTANEOUS

## 2022-09-22 NOTE — Progress Notes (Signed)
Hematology and Oncology Follow Up Visit  Eric [name] [id number] 09-01-1941 82 y.o. 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 He is doing quite well and has no complaints at this time.    Since our last visit he had a pacemaker implanted.  He is doing well postsurgery but has had a bit of fatigue.  He has follow-up next week with his surgeon.  He has not noted any blood loss. No bruising or petechiae.  No fever, chills, n/v, cough, rash, dizziness, SOB, chest pain, palpitations, abdominal pain or changes in bowel or bladder habits.  No swelling, tenderness, numbness or tingling in his extremities at this time.  No falls or syncope.  Appetite and hydration are good. Weight is 246 lbs.   ECOG Performance Status: 1 - Symptomatic but completely ambulatory  Medications:  Allergies as of 09/22/2022       Reactions   Iohexol Other (See Comments)    Desc: "shuts kidneys down"   Morphine And Related Shortness Of Breath   Hydrocodone Nausea And Vomiting   Sulfa Antibiotics Other (See Comments)   UNKNOWN REACTION   Sulfonamide Derivatives Other (See Comments)   UNKNOWN REACTION   Tramadol Rash   Trazodone Rash, Other (See Comments)   Pain   Trazodone And Nefazodone Rash   Pain        Medication List        Accurate as of September 22, 2022  2:28 PM. If you have any questions, ask your nurse or doctor.          acetaminophen 325 MG tablet Commonly known as: TYLENOL Take 650 mg by mouth every 6 (six) hours as needed for moderate pain.   allopurinol 100 MG tablet Commonly known as: ZYLOPRIM Take 100 mg by mouth daily.   apixaban 2.5 MG Tabs tablet Commonly known as: ELIQUIS Take 1 tablet (2.5 mg total) by mouth 2  (two) times daily.   Cholecalciferol 125 MCG (5000 UT) Tabs Take 5,000 Units by mouth daily.   cyanocobalamin 1000 MCG tablet Commonly known as: VITAMIN B12 Take 1 tablet by mouth daily.   ferrous sulfate 325 (65 FE) MG tablet Take 1 tablet by mouth daily.   insulin lispro 100 UNIT/ML KwikPen Commonly known as: HUMALOG Inject 20 Units into the skin 3 (three) times daily.   levothyroxine 100 MCG tablet Commonly known as: SYNTHROID Take 50-100 mcg by mouth See admin instructions. Takes 100 mg daily except Sundays, on Sunday takes 50 mg   levothyroxine 100 MCG tablet Commonly known as: SYNTHROID Take 50 mcg by mouth daily before breakfast.   metoprolol tartrate 25 MG tablet Commonly known as: LOPRESSOR Take 25 mg by mouth 2 (two) times daily.   OneTouch Delica Lancets 24O Misc 4 (four) times daily.   silodosin 8 MG Caps capsule Commonly known as: RAPAFLO Take 8 mg by mouth daily.   simvastatin 20 MG tablet Commonly known as: ZOCOR Take 1 tablet (20 mg total) by mouth at bedtime.   spironolactone 50 MG tablet Commonly known as: ALDACTONE Take 1 tablet (50 mg total) by mouth daily.   torsemide 20 MG tablet Commonly known as: DEMADEX Take 1 tablet (20 mg total)  by mouth 2 (two) times daily. Please only take a second pill on M-W-F   Toujeo SoloStar 300 UNIT/ML Solostar Pen Generic drug: insulin glargine (1 Unit Dial) Inject 46 Units into the skin daily. Sliding scale        Allergies:  Allergies  Allergen Reactions   Iohexol Other (See Comments)     Desc: "shuts kidneys down"    Morphine And Related Shortness Of Breath   Hydrocodone Nausea And Vomiting   Sulfa Antibiotics Other (See Comments)    UNKNOWN REACTION   Sulfonamide Derivatives Other (See Comments)    UNKNOWN REACTION   Tramadol Rash   Trazodone Rash and Other (See Comments)    Pain   Trazodone And Nefazodone Rash    Pain    Past Medical History, Surgical history, Social history, and Family  History were reviewed and updated.  Review of Systems: All other 10 point review of systems is negative.   Physical Exam:  height is 6' (1.829 m) and weight is 237 lb 12.8 oz (107.9 kg). His oral temperature is 97.7 F (36.5 C). His blood pressure is 132/58 (abnormal) and his pulse is 61. His respiration is 19 and oxygen saturation is 100%.   Wt Readings from Last 3 Encounters:  09/22/22 237 lb 12.8 oz (107.9 kg)  09/09/22 247 lb 12.8 oz (112.4 kg)  06/17/22 246 lb (111.6 kg)    Ocular: Sclerae unicteric, pupils equal, round and reactive to light Ear-nose-throat: Oropharynx clear, dentition fair Lymphatic: No cervical or supraclavicular adenopathy Lungs no rales or rhonchi, good excursion bilaterally Heart regular rate and rhythm, no murmur appreciated Abd soft, nontender, positive bowel sounds MSK no focal spinal tenderness, no joint edema Neuro: non-focal, well-oriented, appropriate affect  Lab Results  Component Value Date   WBC 6.4 09/22/2022   HGB 10.4 (L) 09/22/2022   HCT 30.6 (L) 09/22/2022   MCV 89.7 09/22/2022   PLT 132 (L) 09/22/2022   Lab Results  Component Value Date   FERRITIN 120 06/17/2022   IRON 99 06/17/2022   TIBC 287 06/17/2022   UIBC 188 06/17/2022   IRONPCTSAT 35 06/17/2022   Lab Results  Component Value Date   RETICCTPCT 5.6 (H) 09/22/2022   RBC 3.43 (L) 09/22/2022   Lab Results  Component Value Date   KPAFRELGTCHN 49.7 (H) 02/17/2020   LAMBDASER 40.1 (H) 02/17/2020   KAPLAMBRATIO 6.90 04/30/2020   Lab Results  Component Value Date   IGGSERUM 1,267 04/29/2020   IGA 231 04/29/2020   IGMSERUM 36 04/29/2020   Lab Results  Component Value Date   TOTALPROTELP 6.5 04/29/2020   ALBUMINELP 2.9 02/17/2020   A1GS 0.4 02/17/2020   A2GS 0.7 02/17/2020   BETS 0.8 02/17/2020   GAMS 0.7 02/17/2020   MSPIKE 0.3 (H) 02/17/2020   SPEI Comment 02/17/2020     Chemistry      Component Value Date/Time   NA 138 09/22/2022 1053   K 5.0 09/22/2022  1053   CL 106 09/22/2022 1053   CO2 23 09/22/2022 1053   BUN 88 (H) 09/22/2022 1053   CREATININE 2.78 (H) 09/22/2022 1053      Component Value Date/Time   CALCIUM 9.7 09/22/2022 1053   ALKPHOS 77 09/22/2022 1053   AST 16 09/22/2022 1053   ALT 17 09/22/2022 1053   BILITOT 2.1 (H) 09/22/2022 1053      Impression and Plan: Mr. Yamashiro is a very pleasant 82 yo caucasian gentleman with myelodysplasia -- refractory anemia with ringed sideroblast.  ESA given today (Hgb 10.4) Friday Luspatercept- (had to be ordered by pharmacy) RTC 3 weeks from Friday MD, labs, +- Retacrit/Luspatercept -[place]    Hughie Closs, PA-C 1/2/20242:28 PM

## 2022-09-22 NOTE — Patient Instructions (Signed)

## 2022-09-23 ENCOUNTER — Other Ambulatory Visit: Payer: Self-pay

## 2022-09-23 NOTE — Telephone Encounter (Signed)
Spoke with pt, he reports he is doing well and he is moving to follow up with cardiology to [place]. They will call us back if anything is needed.

## 2022-09-24 ENCOUNTER — Ambulatory Visit (HOSPITAL_BASED_OUTPATIENT_CLINIC_OR_DEPARTMENT_OTHER): Payer: Medicare Other

## 2022-09-25 ENCOUNTER — Inpatient Hospital Stay: Payer: Medicare Other

## 2022-09-25 VITALS — BP 154/63 | HR 66 | Temp 97.5°F | Resp 18

## 2022-09-25 DIAGNOSIS — D4620 Refractory anemia with excess of blasts, unspecified: Secondary | ICD-10-CM

## 2022-09-25 DIAGNOSIS — D461 Refractory anemia with ring sideroblasts: Secondary | ICD-10-CM | POA: Diagnosis not present

## 2022-09-25 MED ORDER — LUSPATERCEPT-AAMT 75 MG ~~LOC~~ SOLR
100.0000 mg | Freq: Once | SUBCUTANEOUS | Status: AC
  Administered 2022-09-25: 100 mg via SUBCUTANEOUS
  Filled 2022-09-25: qty 1.5

## 2022-09-25 NOTE — Patient Instructions (Signed)
Luspatercept Injection What is this medication? LUSPATERCEPT (lus PAT er sept) treats low levels of red blood cells (anemia) in the body in people with beta thalassemia or myelodysplastic syndromes. It works by helping the body make more red blood cells. This medicine may be used for other purposes; ask your health care provider or pharmacist if you have questions. COMMON BRAND NAME(S): REBLOZYL What should I tell my care team before I take this medication? They need to know if you have any of these conditions: Have had your spleen removed High blood pressure History of blood clots Tobacco use An unusual or allergic reaction to luspatercept, other medications, foods, dyes or preservatives Pregnant or trying to get pregnant Breast-feeding How should I use this medication? This medication is for injection under the skin. It is given by your care team in a hospital or clinic setting. Talk to your care team about the use of the medication in children. This medication is not approved for use in children. Overdosage: If you think you have taken too much of this medicine contact a poison control center or emergency room at once. NOTE: This medicine is only for you. Do not share this medicine with others. What if I miss a dose? Keep appointments for follow-up doses. It is important not to miss your dose. Call your care team if you are unable to keep an appointment. What may interact with this medication? Interactions are not expected. This list may not describe all possible interactions. Give your health care provider a list of all the medicines, herbs, non-prescription drugs, or dietary supplements you use. Also tell them if you smoke, drink alcohol, or use illegal drugs. Some items may interact with your medicine. What should I watch for while using this medication? Your condition will be monitored carefully while you are receiving this medication. Talk to your care team if you wish to become  pregnant or think you might be pregnant. This medication can cause serious birth defects. Discuss contraceptive options with your care team. Do not breastfeed while taking this medication. You may need blood work done while you are taking this medication. What side effects may I notice from receiving this medication? Side effects that you should report to your care team as soon as possible: Allergic reactions--skin rash, itching, hives, swelling of the face, lips, tongue, or throat Blood clot--pain, swelling, or warmth in the leg, shortness of breath, chest pain Increase in blood pressure Severe back pain, numbness or weakness of the hands, arms, legs, or feet, loss of coordination, loss of bowel or bladder control Side effects that usually do not require medical attention (report these to your care team if they continue or are bothersome): Bone pain Dizziness Fatigue Headache Joint pain Muscle pain Stomach pain This list may not describe all possible side effects. Call your doctor for medical advice about side effects. You may report side effects to FDA at 1-800-FDA-1088. Where should I keep my medication? This medication is given in a hospital or clinic. It will not be stored at home. NOTE: This sheet is a summary. It may not cover all possible information. If you have questions about this medicine, talk to your doctor, pharmacist, or health care provider.  2023 Elsevier/Gold Standard (2021-04-04 00:00:00)  

## 2022-10-13 ENCOUNTER — Inpatient Hospital Stay: Payer: Medicare Other

## 2022-10-13 ENCOUNTER — Encounter: Payer: Self-pay | Admitting: Family

## 2022-10-13 ENCOUNTER — Inpatient Hospital Stay (HOSPITAL_BASED_OUTPATIENT_CLINIC_OR_DEPARTMENT_OTHER): Payer: Medicare Other | Admitting: Family

## 2022-10-13 VITALS — BP 129/69 | HR 74 | Temp 97.4°F | Resp 18 | Wt 246.8 lb

## 2022-10-13 DIAGNOSIS — D461 Refractory anemia with ring sideroblasts: Secondary | ICD-10-CM

## 2022-10-13 DIAGNOSIS — D4620 Refractory anemia with excess of blasts, unspecified: Secondary | ICD-10-CM

## 2022-10-13 DIAGNOSIS — D509 Iron deficiency anemia, unspecified: Secondary | ICD-10-CM | POA: Diagnosis not present

## 2022-10-13 LAB — CBC WITH DIFFERENTIAL (CANCER CENTER ONLY)
Abs Immature Granulocytes: 0.02 10*3/uL (ref 0.00–0.07)
Basophils Absolute: 0 10*3/uL (ref 0.0–0.1)
Basophils Relative: 1 %
Eosinophils Absolute: 0.1 10*3/uL (ref 0.0–0.5)
Eosinophils Relative: 3 %
HCT: 35.1 % — ABNORMAL LOW (ref 39.0–52.0)
Hemoglobin: 11.5 g/dL — ABNORMAL LOW (ref 13.0–17.0)
Immature Granulocytes: 0 %
Lymphocytes Relative: 29 %
Lymphs Abs: 1.4 10*3/uL (ref 0.7–4.0)
MCH: 31.3 pg (ref 26.0–34.0)
MCHC: 32.8 g/dL (ref 30.0–36.0)
MCV: 95.4 fL (ref 80.0–100.0)
Monocytes Absolute: 0.4 10*3/uL (ref 0.1–1.0)
Monocytes Relative: 8 %
Neutro Abs: 2.8 10*3/uL (ref 1.7–7.7)
Neutrophils Relative %: 59 %
Platelet Count: 103 10*3/uL — ABNORMAL LOW (ref 150–400)
RBC: 3.68 MIL/uL — ABNORMAL LOW (ref 4.22–5.81)
RDW: 18.5 % — ABNORMAL HIGH (ref 11.5–15.5)
WBC Count: 4.8 10*3/uL (ref 4.0–10.5)
nRBC: 0 % (ref 0.0–0.2)

## 2022-10-13 LAB — CMP (CANCER CENTER ONLY)
ALT: 11 U/L (ref 0–44)
AST: 13 U/L — ABNORMAL LOW (ref 15–41)
Albumin: 4.2 g/dL (ref 3.5–5.0)
Alkaline Phosphatase: 93 U/L (ref 38–126)
Anion gap: 10 (ref 5–15)
BUN: 55 mg/dL — ABNORMAL HIGH (ref 8–23)
CO2: 23 mmol/L (ref 22–32)
Calcium: 9.9 mg/dL (ref 8.9–10.3)
Chloride: 104 mmol/L (ref 98–111)
Creatinine: 2.69 mg/dL — ABNORMAL HIGH (ref 0.61–1.24)
GFR, Estimated: 23 mL/min — ABNORMAL LOW (ref >60)
Glucose, Bld: 215 mg/dL — ABNORMAL HIGH (ref 70–99)
Potassium: 5.1 mmol/L (ref 3.5–5.1)
Sodium: 137 mmol/L (ref 135–145)
Total Bilirubin: 2 mg/dL — ABNORMAL HIGH (ref 0.3–1.2)
Total Protein: 6.5 g/dL (ref 6.5–8.1)

## 2022-10-13 LAB — RETICULOCYTES
Immature Retic Fract: 20.1 % — ABNORMAL HIGH (ref 2.3–15.9)
RBC.: 3.66 MIL/uL — ABNORMAL LOW (ref 4.22–5.81)
Retic Count, Absolute: 223.5 10*3/uL — ABNORMAL HIGH (ref 19.0–186.0)
Retic Ct Pct: 6.1 % — ABNORMAL HIGH (ref 0.4–3.1)

## 2022-10-13 LAB — IRON AND IRON BINDING CAPACITY (CC-WL,HP ONLY)
Iron: 74 ug/dL (ref 45–182)
Saturation Ratios: 25 % (ref 17.9–39.5)
TIBC: 300 ug/dL (ref 250–450)
UIBC: 226 ug/dL (ref 117–376)

## 2022-10-13 LAB — FERRITIN: Ferritin: 94 ng/mL (ref 24–336)

## 2022-10-13 NOTE — Progress Notes (Signed)
Hematology and Oncology Follow Up Visit  Eric [name] [id number] October 23, 1940 82 y.o. 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 Hgb today is 11.5! No injection needed this visit.  He has fatigue at times but overall is doing well.  He had one episode of dizziness after having his pacemaker placed but has had no issues since. No falls or syncope reported.  No blood loss noted.  He is having itching and skin does appear to be dry at this time.  He has some mild bruising where he has been scratching. He will try using a thick moisturizing cream twice and day and see if this helps.  No fever, chills, n/v, cough, rash,  chest pain, palpitations, abdominal pain or changes in bowel or bladder habits.  No falls or syncope reported.  Appetite and hydration are good. Weight is stable at 246 lbs.   ECOG Performance Status: 1 - Symptomatic but completely ambulatory  Medications:  Allergies as of 10/13/2022       Reactions   Iohexol Other (See Comments)    Desc: "shuts kidneys down"   Morphine And Related Shortness Of Breath   Hydrocodone Nausea And Vomiting   Sulfa Antibiotics Other (See Comments)   UNKNOWN REACTION   Sulfonamide Derivatives Other (See Comments)   UNKNOWN REACTION   Tramadol Rash   Trazodone Rash, Other (See Comments)   Pain   Trazodone And Nefazodone Rash   Pain        Medication List        Accurate as of October 13, 2022  1:02 PM. If you have any questions, ask your nurse or doctor.          acetaminophen 325 MG tablet Commonly known as: TYLENOL Take 650 mg by mouth every 6 (six) hours as needed for moderate pain.   allopurinol 100 MG tablet Commonly known as: ZYLOPRIM Take 100 mg by mouth  daily.   apixaban 2.5 MG Tabs tablet Commonly known as: ELIQUIS Take 1 tablet (2.5 mg total) by mouth 2 (two) times daily.   Cholecalciferol 125 MCG (5000 UT) Tabs Take 5,000 Units by mouth daily.   cyanocobalamin 1000 MCG tablet Commonly known as: VITAMIN B12 Take 1 tablet by mouth daily.   ferrous sulfate 325 (65 FE) MG tablet Take 1 tablet by mouth daily.   insulin lispro 100 UNIT/ML KwikPen Commonly known as: HUMALOG Inject 20 Units into the skin 3 (three) times daily.   levothyroxine 100 MCG tablet Commonly known as: SYNTHROID Take 50-100 mcg by mouth See admin instructions. Takes 100 mg daily except Sundays, on Sunday takes 50 mg   levothyroxine 100 MCG tablet Commonly known as: SYNTHROID Take 50 mcg by mouth daily before breakfast.   metoprolol tartrate 25 MG tablet Commonly known as: LOPRESSOR Take 25 mg by mouth 2 (two) times daily.   OneTouch Delica Lancets 54T Misc 4 (four) times daily.   silodosin 8 MG Caps capsule Commonly known as: RAPAFLO Take 8 mg by mouth daily.   simvastatin 20 MG tablet Commonly known as: ZOCOR Take 1 tablet (20 mg total) by mouth at bedtime.   spironolactone 50 MG tablet Commonly known as: ALDACTONE Take 1 tablet (50  mg total) by mouth daily.   torsemide 20 MG tablet Commonly known as: DEMADEX Take 1 tablet (20 mg total) by mouth 2 (two) times daily. Please only take a second pill on M-W-F   Toujeo SoloStar 300 UNIT/ML Solostar Pen Generic drug: insulin glargine (1 Unit Dial) Inject 46 Units into the skin daily. Sliding scale        Allergies:  Allergies  Allergen Reactions   Iohexol Other (See Comments)     Desc: "shuts kidneys down"    Morphine And Related Shortness Of Breath   Hydrocodone Nausea And Vomiting   Sulfa Antibiotics Other (See Comments)    UNKNOWN REACTION   Sulfonamide Derivatives Other (See Comments)    UNKNOWN REACTION   Tramadol Rash   Trazodone Rash and Other (See Comments)    Pain    Trazodone And Nefazodone Rash    Pain    Past Medical History, Surgical history, Social history, and Family History were reviewed and updated.  Review of Systems: All other 10 point review of systems is negative.   Physical Exam:  vitals were not taken for this visit.   Wt Readings from Last 3 Encounters:  09/22/22 237 lb 12.8 oz (107.9 kg)  09/09/22 247 lb 12.8 oz (112.4 kg)  06/17/22 246 lb (111.6 kg)    Ocular: Sclerae unicteric, pupils equal, round and reactive to light Ear-nose-throat: Oropharynx clear, dentition fair Lymphatic: No cervical or supraclavicular adenopathy Lungs no rales or rhonchi, good excursion bilaterally Heart regular rate and rhythm, no murmur appreciated Abd soft, nontender, positive bowel sounds MSK no focal spinal tenderness, no joint edema Neuro: non-focal, well-oriented, appropriate affect Breasts: Deferred   Lab Results  Component Value Date   WBC 4.8 10/13/2022   HGB 11.5 (L) 10/13/2022   HCT 35.1 (L) 10/13/2022   MCV 95.4 10/13/2022   PLT 103 (L) 10/13/2022   Lab Results  Component Value Date   FERRITIN 176 09/22/2022   IRON 91 09/22/2022   TIBC 333 09/22/2022   UIBC 242 09/22/2022   IRONPCTSAT 27 09/22/2022   Lab Results  Component Value Date   RETICCTPCT 5.6 (H) 09/22/2022   RBC 3.68 (L) 10/13/2022   Lab Results  Component Value Date   KPAFRELGTCHN 49.7 (H) 02/17/2020   LAMBDASER 40.1 (H) 02/17/2020   KAPLAMBRATIO 6.90 04/30/2020   Lab Results  Component Value Date   IGGSERUM 1,267 04/29/2020   IGA 231 04/29/2020   IGMSERUM 36 04/29/2020   Lab Results  Component Value Date   TOTALPROTELP 6.5 04/29/2020   ALBUMINELP 2.9 02/17/2020   A1GS 0.4 02/17/2020   A2GS 0.7 02/17/2020   BETS 0.8 02/17/2020   GAMS 0.7 02/17/2020   MSPIKE 0.3 (H) 02/17/2020   SPEI Comment 02/17/2020     Chemistry      Component Value Date/Time   NA 138 09/22/2022 1053   K 5.0 09/22/2022 1053   CL 106 09/22/2022 1053   CO2 23 09/22/2022  1053   BUN 88 (H) 09/22/2022 1053   CREATININE 2.78 (H) 09/22/2022 1053      Component Value Date/Time   CALCIUM 9.7 09/22/2022 1053   ALKPHOS 77 09/22/2022 1053   AST 16 09/22/2022 1053   ALT 17 09/22/2022 1053   BILITOT 2.1 (H) 09/22/2022 1053       Impression and Plan: Eric [name] is a very pleasant 82 yo caucasian gentleman with myelodysplasia -- refractory anemia with ringed sideroblast.   No injections today, Hgb 11.5.  Follow-up in 3  weeks.   Lottie Dawson, NP 1/23/20241:02 PM

## 2022-10-14 ENCOUNTER — Inpatient Hospital Stay: Payer: Medicare Other | Admitting: Oncology

## 2022-10-14 ENCOUNTER — Inpatient Hospital Stay: Payer: Medicare Other

## 2022-11-01 ENCOUNTER — Other Ambulatory Visit: Payer: Self-pay | Admitting: Cardiology

## 2022-11-02 NOTE — Telephone Encounter (Signed)
Rx refill sent to pharmacy. 

## 2022-11-03 ENCOUNTER — Other Ambulatory Visit: Payer: Self-pay

## 2022-11-04 ENCOUNTER — Encounter: Payer: Self-pay | Admitting: Family

## 2022-11-04 ENCOUNTER — Inpatient Hospital Stay (HOSPITAL_BASED_OUTPATIENT_CLINIC_OR_DEPARTMENT_OTHER): Payer: Medicare Other | Admitting: Family

## 2022-11-04 ENCOUNTER — Other Ambulatory Visit: Payer: Self-pay

## 2022-11-04 ENCOUNTER — Inpatient Hospital Stay: Payer: Medicare Other | Attending: Hematology & Oncology

## 2022-11-04 ENCOUNTER — Inpatient Hospital Stay: Payer: Medicare Other

## 2022-11-04 VITALS — BP 146/69 | HR 67 | Temp 97.6°F | Resp 19 | Ht 72.0 in | Wt 249.1 lb

## 2022-11-04 DIAGNOSIS — D4620 Refractory anemia with excess of blasts, unspecified: Secondary | ICD-10-CM | POA: Diagnosis not present

## 2022-11-04 DIAGNOSIS — D461 Refractory anemia with ring sideroblasts: Secondary | ICD-10-CM | POA: Diagnosis present

## 2022-11-04 DIAGNOSIS — D509 Iron deficiency anemia, unspecified: Secondary | ICD-10-CM

## 2022-11-04 LAB — RETICULOCYTES
Immature Retic Fract: 16 % — ABNORMAL HIGH (ref 2.3–15.9)
RBC.: 3.6 MIL/uL — ABNORMAL LOW (ref 4.22–5.81)
Retic Count, Absolute: 146.9 10*3/uL (ref 19.0–186.0)
Retic Ct Pct: 4.1 % — ABNORMAL HIGH (ref 0.4–3.1)

## 2022-11-04 LAB — CMP (CANCER CENTER ONLY)
ALT: 16 U/L (ref 0–44)
AST: 15 U/L (ref 15–41)
Albumin: 4.2 g/dL (ref 3.5–5.0)
Alkaline Phosphatase: 89 U/L (ref 38–126)
Anion gap: 9 (ref 5–15)
BUN: 57 mg/dL — ABNORMAL HIGH (ref 8–23)
CO2: 24 mmol/L (ref 22–32)
Calcium: 9.3 mg/dL (ref 8.9–10.3)
Chloride: 103 mmol/L (ref 98–111)
Creatinine: 2.44 mg/dL — ABNORMAL HIGH (ref 0.61–1.24)
GFR, Estimated: 26 mL/min — ABNORMAL LOW (ref >60)
Glucose, Bld: 292 mg/dL — ABNORMAL HIGH (ref 70–99)
Potassium: 4.9 mmol/L (ref 3.5–5.1)
Sodium: 136 mmol/L (ref 135–145)
Total Bilirubin: 2 mg/dL — ABNORMAL HIGH (ref 0.3–1.2)
Total Protein: 6.6 g/dL (ref 6.5–8.1)

## 2022-11-04 LAB — CBC WITH DIFFERENTIAL (CANCER CENTER ONLY)
Abs Immature Granulocytes: 0.02 10*3/uL (ref 0.00–0.07)
Basophils Absolute: 0.1 10*3/uL (ref 0.0–0.1)
Basophils Relative: 1 %
Eosinophils Absolute: 0.1 10*3/uL (ref 0.0–0.5)
Eosinophils Relative: 3 %
HCT: 33.1 % — ABNORMAL LOW (ref 39.0–52.0)
Hemoglobin: 11.2 g/dL — ABNORMAL LOW (ref 13.0–17.0)
Immature Granulocytes: 0 %
Lymphocytes Relative: 21 %
Lymphs Abs: 1 10*3/uL (ref 0.7–4.0)
MCH: 30.8 pg (ref 26.0–34.0)
MCHC: 33.8 g/dL (ref 30.0–36.0)
MCV: 90.9 fL (ref 80.0–100.0)
Monocytes Absolute: 0.3 10*3/uL (ref 0.1–1.0)
Monocytes Relative: 7 %
Neutro Abs: 3.3 10*3/uL (ref 1.7–7.7)
Neutrophils Relative %: 68 %
Platelet Count: 88 10*3/uL — ABNORMAL LOW (ref 150–400)
RBC: 3.64 MIL/uL — ABNORMAL LOW (ref 4.22–5.81)
RDW: 15.8 % — ABNORMAL HIGH (ref 11.5–15.5)
WBC Count: 4.8 10*3/uL (ref 4.0–10.5)
nRBC: 0 % (ref 0.0–0.2)

## 2022-11-04 LAB — FERRITIN: Ferritin: 107 ng/mL (ref 24–336)

## 2022-11-04 MED ORDER — LUSPATERCEPT-AAMT 75 MG ~~LOC~~ SOLR: 1.0000 mg/kg | Freq: Once | SUBCUTANEOUS | Status: DC

## 2022-11-04 NOTE — Progress Notes (Addendum)
Hematology and Oncology Follow Up Visit  Eric [name] [id number] September 30, 1940 82 y.o. 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 He is doing well.  His only compliant at this time is itching with dry skin. He feels this may be due to his diuretic and plans to discuss with cardiology at his next follow-up.  No blood loss noted. No bruising or petechiae.  No fever, chills, n/v, cough, rash, dizziness, chest pain, palpitations, abdominal pain or changes in bowel or bladder habits.  Occasional SOB with over exertion unchanged from baseline.  No swelling, tenderness in his extremities.  He has intermittent tingling in his hands that comes and goes.  No new falls or syncope.  Appetite and hydration are good. Weight is 249 lbs.   ECOG Performance Status: 1 - Symptomatic but completely ambulatory  Medications:  Allergies as of 11/04/2022       Reactions   Iohexol Other (See Comments)    Desc: "shuts kidneys down"   Morphine And Related Shortness Of Breath   Hydrocodone Nausea And Vomiting   Sulfa Antibiotics Other (See Comments)   UNKNOWN REACTION   Sulfonamide Derivatives Other (See Comments)   UNKNOWN REACTION   Tramadol Rash   Trazodone Rash, Other (See Comments)   Pain   Trazodone And Nefazodone Rash   Pain        Medication List        Accurate as of November 04, 2022  1:05 PM. If you have any questions, ask your nurse or doctor.          acetaminophen 325 MG tablet Commonly known as: TYLENOL Take 650 mg by mouth every 6 (six) hours as needed for moderate pain.   allopurinol 100 MG tablet Commonly known as: ZYLOPRIM Take 100 mg by mouth daily.   apixaban 2.5 MG Tabs  tablet Commonly known as: ELIQUIS Take 1 tablet (2.5 mg total) by mouth 2 (two) times daily.   Cholecalciferol 125 MCG (5000 UT) Tabs Take 5,000 Units by mouth daily.   cyanocobalamin 1000 MCG tablet Commonly known as: VITAMIN B12 Take 1 tablet by mouth daily.   ferrous sulfate 325 (65 FE) MG tablet Take 1 tablet by mouth daily.   insulin lispro 100 UNIT/ML KwikPen Commonly known as: HUMALOG Inject 20 Units into the skin 3 (three) times daily.   levothyroxine 100 MCG tablet Commonly known as: SYNTHROID Take 50-100 mcg by mouth See admin instructions. Takes 100 mg daily except Sundays, on Sunday takes 50 mg   levothyroxine 100 MCG tablet Commonly known as: SYNTHROID Take 50 mcg by mouth daily before breakfast.   metoprolol tartrate 25 MG tablet Commonly known as: LOPRESSOR Take 25 mg by mouth 2 (two) times daily.   OneTouch Delica Lancets 99991111 Misc 4 (four) times daily.   silodosin 8 MG Caps capsule Commonly known as: RAPAFLO Take 8 mg by mouth daily.   simvastatin 20 MG tablet Commonly known as: ZOCOR Take 1 tablet (20 mg total) by mouth at bedtime.   spironolactone 50 MG tablet Commonly known as: ALDACTONE Take 1 tablet (50 mg total) by mouth daily.   torsemide 20 MG tablet Commonly known as: DEMADEX  Take 1 tablet (20 mg total) by mouth daily. Take 2nd pill on Monday, Wednesday and Friday   Toujeo SoloStar 300 UNIT/ML Solostar Pen Generic drug: insulin glargine (1 Unit Dial) Inject 46 Units into the skin daily. Sliding scale        Allergies:  Allergies  Allergen Reactions   Iohexol Other (See Comments)     Desc: "shuts kidneys down"    Morphine And Related Shortness Of Breath   Hydrocodone Nausea And Vomiting   Sulfa Antibiotics Other (See Comments)    UNKNOWN REACTION   Sulfonamide Derivatives Other (See Comments)    UNKNOWN REACTION   Tramadol Rash   Trazodone Rash and Other (See Comments)    Pain   Trazodone And Nefazodone Rash    Pain     Past Medical History, Surgical history, Social history, and Family History were reviewed and updated.  Review of Systems: All other 10 point review of systems is negative.   Physical Exam:  vitals were not taken for this visit.   Wt Readings from Last 3 Encounters:  10/13/22 246 lb 12.8 oz (111.9 kg)  09/22/22 237 lb 12.8 oz (107.9 kg)  09/09/22 247 lb 12.8 oz (112.4 kg)    Ocular: Sclerae unicteric, pupils equal, round and reactive to light Ear-nose-throat: Oropharynx clear, dentition fair Lymphatic: No cervical or supraclavicular adenopathy Lungs no rales or rhonchi, good excursion bilaterally Heart regular rate and rhythm, no murmur appreciated Abd soft, nontender, positive bowel sounds MSK no focal spinal tenderness, no joint edema Neuro: non-focal, well-oriented, appropriate affect Breasts: Deferred   Lab Results  Component Value Date   WBC 4.8 10/13/2022   HGB 11.5 (L) 10/13/2022   HCT 35.1 (L) 10/13/2022   MCV 95.4 10/13/2022   PLT 103 (L) 10/13/2022   Lab Results  Component Value Date   FERRITIN 94 10/13/2022   IRON 74 10/13/2022   TIBC 300 10/13/2022   UIBC 226 10/13/2022   IRONPCTSAT 25 10/13/2022   Lab Results  Component Value Date   RETICCTPCT 6.1 (H) 10/13/2022   RBC 3.66 (L) 10/13/2022   Lab Results  Component Value Date   KPAFRELGTCHN 49.7 (H) 02/17/2020   LAMBDASER 40.1 (H) 02/17/2020   KAPLAMBRATIO 6.90 04/30/2020   Lab Results  Component Value Date   IGGSERUM 1,267 04/29/2020   IGA 231 04/29/2020   IGMSERUM 36 04/29/2020   Lab Results  Component Value Date   TOTALPROTELP 6.5 04/29/2020   ALBUMINELP 2.9 02/17/2020   A1GS 0.4 02/17/2020   A2GS 0.7 02/17/2020   BETS 0.8 02/17/2020   GAMS 0.7 02/17/2020   MSPIKE 0.3 (H) 02/17/2020   SPEI Comment 02/17/2020     Chemistry      Component Value Date/Time   NA 137 10/13/2022 1243   K 5.1 10/13/2022 1243   CL 104 10/13/2022 1243   CO2 23 10/13/2022 1243   BUN 55 (H) 10/13/2022  1243   CREATININE 2.69 (H) 10/13/2022 1243      Component Value Date/Time   CALCIUM 9.9 10/13/2022 1243   ALKPHOS 93 10/13/2022 1243   AST 13 (L) 10/13/2022 1243   ALT 11 10/13/2022 1243   BILITOT 2.0 (H) 10/13/2022 1243       Impression and Plan: Eric [name] is a very pleasant 82 yo caucasian gentleman with myelodysplasia -- refractory anemia with ringed sideroblast.   No injections needed today, Hgb 11.2.  Iron studies pending.  Follow-up in 3 weeks.   Lottie Dawson, NP 2/14/20241:05 PM

## 2022-11-05 LAB — IRON AND IRON BINDING CAPACITY (CC-WL,HP ONLY)
Iron: 51 ug/dL (ref 45–182)
Saturation Ratios: 17 % — ABNORMAL LOW (ref 17.9–39.5)
TIBC: 302 ug/dL (ref 250–450)
UIBC: 251 ug/dL (ref 117–376)

## 2022-11-09 ENCOUNTER — Other Ambulatory Visit: Payer: Self-pay | Admitting: Family

## 2022-11-19 ENCOUNTER — Telehealth: Payer: Self-pay | Admitting: *Deleted

## 2022-11-19 NOTE — Telephone Encounter (Signed)
Call received from patient stating that he would like to hold off on iron infusions at this time.  Infusions canceled and [place] NP notified.

## 2022-11-19 NOTE — Telephone Encounter (Signed)
Opened in error

## 2022-11-20 ENCOUNTER — Other Ambulatory Visit: Payer: Self-pay

## 2022-11-24 ENCOUNTER — Encounter: Payer: Self-pay | Admitting: Family

## 2022-11-24 ENCOUNTER — Inpatient Hospital Stay: Payer: Medicare Other

## 2022-11-24 ENCOUNTER — Other Ambulatory Visit: Payer: Self-pay | Admitting: Hematology & Oncology

## 2022-11-24 ENCOUNTER — Inpatient Hospital Stay: Payer: Medicare Other | Attending: Hematology & Oncology

## 2022-11-24 ENCOUNTER — Inpatient Hospital Stay (HOSPITAL_BASED_OUTPATIENT_CLINIC_OR_DEPARTMENT_OTHER): Payer: Medicare Other | Admitting: Family

## 2022-11-24 VITALS — BP 135/58 | HR 72 | Temp 97.9°F | Resp 18 | Wt 252.0 lb

## 2022-11-24 DIAGNOSIS — D4620 Refractory anemia with excess of blasts, unspecified: Secondary | ICD-10-CM

## 2022-11-24 DIAGNOSIS — Z79899 Other long term (current) drug therapy: Secondary | ICD-10-CM | POA: Diagnosis not present

## 2022-11-24 DIAGNOSIS — D509 Iron deficiency anemia, unspecified: Secondary | ICD-10-CM | POA: Diagnosis not present

## 2022-11-24 DIAGNOSIS — D518 Other vitamin B12 deficiency anemias: Secondary | ICD-10-CM

## 2022-11-24 DIAGNOSIS — D461 Refractory anemia with ring sideroblasts: Secondary | ICD-10-CM

## 2022-11-24 LAB — CBC WITH DIFFERENTIAL (CANCER CENTER ONLY)
Abs Immature Granulocytes: 0.01 10*3/uL (ref 0.00–0.07)
Basophils Absolute: 0.1 10*3/uL (ref 0.0–0.1)
Basophils Relative: 1 %
Eosinophils Absolute: 0.1 10*3/uL (ref 0.0–0.5)
Eosinophils Relative: 2 %
HCT: 31.8 % — ABNORMAL LOW (ref 39.0–52.0)
Hemoglobin: 10.7 g/dL — ABNORMAL LOW (ref 13.0–17.0)
Immature Granulocytes: 0 %
Lymphocytes Relative: 18 %
Lymphs Abs: 0.9 10*3/uL (ref 0.7–4.0)
MCH: 30.7 pg (ref 26.0–34.0)
MCHC: 33.6 g/dL (ref 30.0–36.0)
MCV: 91.1 fL (ref 80.0–100.0)
Monocytes Absolute: 0.4 10*3/uL (ref 0.1–1.0)
Monocytes Relative: 7 %
Neutro Abs: 3.9 10*3/uL (ref 1.7–7.7)
Neutrophils Relative %: 72 %
Platelet Count: 104 10*3/uL — ABNORMAL LOW (ref 150–400)
RBC: 3.49 MIL/uL — ABNORMAL LOW (ref 4.22–5.81)
RDW: 15.5 % (ref 11.5–15.5)
WBC Count: 5.3 10*3/uL (ref 4.0–10.5)
nRBC: 0 % (ref 0.0–0.2)

## 2022-11-24 LAB — CMP (CANCER CENTER ONLY)
ALT: 16 U/L (ref 0–44)
AST: 18 U/L (ref 15–41)
Albumin: 4 g/dL (ref 3.5–5.0)
Alkaline Phosphatase: 103 U/L (ref 38–126)
Anion gap: 6 (ref 5–15)
BUN: 58 mg/dL — ABNORMAL HIGH (ref 8–23)
CO2: 21 mmol/L — ABNORMAL LOW (ref 22–32)
Calcium: 8.8 mg/dL — ABNORMAL LOW (ref 8.9–10.3)
Chloride: 105 mmol/L (ref 98–111)
Creatinine: 2.44 mg/dL — ABNORMAL HIGH (ref 0.61–1.24)
GFR, Estimated: 26 mL/min — ABNORMAL LOW (ref >60)
Glucose, Bld: 359 mg/dL — ABNORMAL HIGH (ref 70–99)
Potassium: 4.7 mmol/L (ref 3.5–5.1)
Sodium: 132 mmol/L — ABNORMAL LOW (ref 135–145)
Total Bilirubin: 2.5 mg/dL — ABNORMAL HIGH (ref 0.3–1.2)
Total Protein: 6.9 g/dL (ref 6.5–8.1)

## 2022-11-24 MED ORDER — DARBEPOETIN ALFA 150 MCG/0.3ML IJ SOSY
150.0000 ug | PREFILLED_SYRINGE | Freq: Once | INTRAMUSCULAR | Status: AC
  Administered 2022-11-24: 150 ug via SUBCUTANEOUS

## 2022-11-24 MED ORDER — LUSPATERCEPT-AAMT 75 MG ~~LOC~~ SOLR
100.0000 mg | Freq: Once | SUBCUTANEOUS | Status: AC
  Administered 2022-11-24: 100 mg via SUBCUTANEOUS
  Filled 2022-11-24: qty 1.5

## 2022-11-24 NOTE — Progress Notes (Signed)
Hematology and Oncology Follow Up Visit  Eric [name] [id number] [birth year] 81 y.o. 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 Hgb is 10.7.  He has occasional fatigue at times.  No episodes of blood loss. No bruising or petechiae.  No fever, chills, n/v, cough, rash, dizziness, chest pain, palpitations, abdominal pain or changes in bowel or bladder habits.  He has mild SOB with over exertion and exacerbation of CHF.  No swelling in his extremities.  Numbness and tingling comes and goes in his fingertips.  No falls or syncope.  Appetite and hydration are good. Weight is stable at 252 lbs.   ECOG Performance Status: 1 - Symptomatic but completely ambulatory  Medications:  Allergies as of 11/24/2022       Reactions   Iohexol Other (See Comments)    Desc: "shuts kidneys down"   Morphine And Related Shortness Of Breath   Hydroxyzine Nausea Only   Fluconazole Rash   And welts   Hydrocodone Nausea And Vomiting   Metoprolol Rash   Sulfa Antibiotics Other (See Comments)   UNKNOWN REACTION   Sulfonamide Derivatives Other (See Comments)   UNKNOWN REACTION   Tramadol Rash   Trazodone Rash, Other (See Comments)   Pain   Trazodone And Nefazodone Rash   Pain   Valtrex [valacyclovir] Itching, Rash        Medication List        Accurate as of November 24, 2022  2:19 PM. If you have any questions, ask your nurse or doctor.          acetaminophen 325 MG tablet Commonly known as: TYLENOL Take 650 mg by mouth every 6 (six) hours as needed for moderate pain.   allopurinol 100 MG tablet Commonly known as: ZYLOPRIM Take 100 mg by mouth daily.   apixaban 2.5 MG Tabs tablet Commonly known as: ELIQUIS Take 1  tablet (2.5 mg total) by mouth 2 (two) times daily.   Cholecalciferol 125 MCG (5000 UT) Tabs Take 5,000 Units by mouth daily.   cyanocobalamin 1000 MCG tablet Commonly known as: VITAMIN B12 Take 1 tablet by mouth daily.   ferrous sulfate 325 (65 FE) MG tablet Take 1 tablet by mouth daily.   insulin lispro 100 UNIT/ML KwikPen Commonly known as: HUMALOG Inject 20 Units into the skin 3 (three) times daily.   levothyroxine 100 MCG tablet Commonly known as: SYNTHROID Take 50-100 mcg by mouth See admin instructions. Takes 100 mg daily except Sundays, on Sunday takes 50 mg   levothyroxine 100 MCG tablet Commonly known as: SYNTHROID Take 50 mcg by mouth daily before breakfast.   OneTouch Delica Lancets 99991111 Misc 4 (four) times daily.   silodosin 8 MG Caps capsule Commonly known as: RAPAFLO Take 8 mg by mouth daily.   simvastatin 20 MG tablet Commonly known as: ZOCOR Take 1 tablet (20 mg total) by mouth at bedtime.   spironolactone 50 MG tablet Commonly known as: ALDACTONE Take 1 tablet (50 mg total) by mouth daily.   torsemide 20 MG tablet Commonly known as: DEMADEX Take 1 tablet (20 mg total) by mouth daily. Take 2nd pill on Monday, Wednesday and Friday   Toujeo SoloStar 300 UNIT/ML  Solostar Pen Generic drug: insulin glargine (1 Unit Dial) Inject 46 Units into the skin daily. Sliding scale        Allergies:  Allergies  Allergen Reactions   Iohexol Other (See Comments)     Desc: "shuts kidneys down"    Morphine And Related Shortness Of Breath   Hydroxyzine Nausea Only   Fluconazole Rash    And welts   Hydrocodone Nausea And Vomiting   Metoprolol Rash   Sulfa Antibiotics Other (See Comments)    UNKNOWN REACTION   Sulfonamide Derivatives Other (See Comments)    UNKNOWN REACTION   Tramadol Rash   Trazodone Rash and Other (See Comments)    Pain   Trazodone And Nefazodone Rash    Pain   Valtrex [Valacyclovir] Itching and Rash    Past Medical History,  Surgical history, Social history, and Family History were reviewed and updated.  Review of Systems: All other 10 point review of systems is negative.   Physical Exam:  weight is 252 lb (114.3 kg). His oral temperature is 97.9 F (36.6 C). His blood pressure is 135/58 (abnormal) and his pulse is 72. His respiration is 18 and oxygen saturation is 100%.   Wt Readings from Last 3 Encounters:  11/24/22 252 lb (114.3 kg)  11/04/22 249 lb 1.3 oz (113 kg)  10/13/22 246 lb 12.8 oz (111.9 kg)    Ocular: Sclerae unicteric, pupils equal, round and reactive to light Ear-nose-throat: Oropharynx clear, dentition fair Lymphatic: No cervical or supraclavicular adenopathy Lungs no rales or rhonchi, good excursion bilaterally Heart regular rate and rhythm, no murmur appreciated Abd soft, nontender, positive bowel sounds MSK no focal spinal tenderness, no joint edema Neuro: non-focal, well-oriented, appropriate affect Breasts: Deferred   Lab Results  Component Value Date   WBC 5.3 11/24/2022   HGB 10.7 (L) 11/24/2022   HCT 31.8 (L) 11/24/2022   MCV 91.1 11/24/2022   PLT 104 (L) 11/24/2022   Lab Results  Component Value Date   FERRITIN 107 11/04/2022   IRON 51 11/04/2022   TIBC 302 11/04/2022   UIBC 251 11/04/2022   IRONPCTSAT 17 (L) 11/04/2022   Lab Results  Component Value Date   RETICCTPCT 4.1 (H) 11/04/2022   RBC 3.49 (L) 11/24/2022   Lab Results  Component Value Date   KPAFRELGTCHN 49.7 (H) 02/17/2020   LAMBDASER 40.1 (H) 02/17/2020   KAPLAMBRATIO 6.90 04/30/2020   Lab Results  Component Value Date   IGGSERUM 1,267 04/29/2020   IGA 231 04/29/2020   IGMSERUM 36 04/29/2020   Lab Results  Component Value Date   TOTALPROTELP 6.5 04/29/2020   ALBUMINELP 2.9 02/17/2020   A1GS 0.4 02/17/2020   A2GS 0.7 02/17/2020   BETS 0.8 02/17/2020   GAMS 0.7 02/17/2020   MSPIKE 0.3 (H) 02/17/2020   SPEI Comment 02/17/2020     Chemistry      Component Value Date/Time   NA 132 (L)  11/24/2022 1345   K 4.7 11/24/2022 1345   CL 105 11/24/2022 1345   CO2 21 (L) 11/24/2022 1345   BUN 58 (H) 11/24/2022 1345   CREATININE 2.44 (H) 11/24/2022 1345      Component Value Date/Time   CALCIUM 8.8 (L) 11/24/2022 1345   ALKPHOS 103 11/24/2022 1345   AST 18 11/24/2022 1345   ALT 16 11/24/2022 1345   BILITOT 2.5 (H) 11/24/2022 1345       Impression and Plan: Eric [name] is a very pleasant 82 yo caucasian gentleman with myelodysplasia -- refractory  anemia with ringed sideroblast.   Both injections given today, Hgb is 10.7.  Iron studies pending.  Follow-up in 3 weeks.   Lottie Dawson, NP 3/5/20242:19 PM

## 2022-11-24 NOTE — Patient Instructions (Signed)

## 2022-11-25 ENCOUNTER — Other Ambulatory Visit: Payer: Self-pay

## 2022-11-27 ENCOUNTER — Ambulatory Visit: Payer: Medicare Other

## 2022-12-04 ENCOUNTER — Ambulatory Visit: Payer: Medicare Other

## 2022-12-15 ENCOUNTER — Inpatient Hospital Stay: Payer: Medicare Other

## 2022-12-15 ENCOUNTER — Inpatient Hospital Stay (HOSPITAL_BASED_OUTPATIENT_CLINIC_OR_DEPARTMENT_OTHER): Payer: Medicare Other | Admitting: Family

## 2022-12-15 ENCOUNTER — Encounter: Payer: Self-pay | Admitting: Family

## 2022-12-15 DIAGNOSIS — D461 Refractory anemia with ring sideroblasts: Secondary | ICD-10-CM

## 2022-12-15 DIAGNOSIS — D509 Iron deficiency anemia, unspecified: Secondary | ICD-10-CM

## 2022-12-15 DIAGNOSIS — D4620 Refractory anemia with excess of blasts, unspecified: Secondary | ICD-10-CM

## 2022-12-15 LAB — CBC WITH DIFFERENTIAL (CANCER CENTER ONLY)
Abs Immature Granulocytes: 0.03 10*3/uL (ref 0.00–0.07)
Basophils Absolute: 0.1 10*3/uL (ref 0.0–0.1)
Basophils Relative: 1 %
Eosinophils Absolute: 0.2 10*3/uL (ref 0.0–0.5)
Eosinophils Relative: 3 %
HCT: 35.8 % — ABNORMAL LOW (ref 39.0–52.0)
Hemoglobin: 12 g/dL — ABNORMAL LOW (ref 13.0–17.0)
Immature Granulocytes: 1 %
Lymphocytes Relative: 26 %
Lymphs Abs: 1.5 10*3/uL (ref 0.7–4.0)
MCH: 29.6 pg (ref 26.0–34.0)
MCHC: 33.5 g/dL (ref 30.0–36.0)
MCV: 88.2 fL (ref 80.0–100.0)
Monocytes Absolute: 0.4 10*3/uL (ref 0.1–1.0)
Monocytes Relative: 8 %
Neutro Abs: 3.6 10*3/uL (ref 1.7–7.7)
Neutrophils Relative %: 61 %
Platelet Count: 116 10*3/uL — ABNORMAL LOW (ref 150–400)
RBC: 4.06 MIL/uL — ABNORMAL LOW (ref 4.22–5.81)
RDW: 16 % — ABNORMAL HIGH (ref 11.5–15.5)
WBC Count: 5.7 10*3/uL (ref 4.0–10.5)
nRBC: 0 % (ref 0.0–0.2)

## 2022-12-15 LAB — CMP (CANCER CENTER ONLY)
ALT: 14 U/L (ref 0–44)
AST: 17 U/L (ref 15–41)
Albumin: 3.7 g/dL (ref 3.5–5.0)
Alkaline Phosphatase: 107 U/L (ref 38–126)
Anion gap: 9 (ref 5–15)
BUN: 44 mg/dL — ABNORMAL HIGH (ref 8–23)
CO2: 21 mmol/L — ABNORMAL LOW (ref 22–32)
Calcium: 8.5 mg/dL — ABNORMAL LOW (ref 8.9–10.3)
Chloride: 104 mmol/L (ref 98–111)
Creatinine: 2.18 mg/dL — ABNORMAL HIGH (ref 0.61–1.24)
GFR, Estimated: 30 mL/min — ABNORMAL LOW (ref >60)
Glucose, Bld: 335 mg/dL — ABNORMAL HIGH (ref 70–99)
Potassium: 4.3 mmol/L (ref 3.5–5.1)
Sodium: 134 mmol/L — ABNORMAL LOW (ref 135–145)
Total Bilirubin: 2.2 mg/dL — ABNORMAL HIGH (ref 0.3–1.2)
Total Protein: 6.8 g/dL (ref 6.5–8.1)

## 2022-12-15 NOTE — Progress Notes (Signed)
Hematology and Oncology Follow Up Visit  Eric [name] [id number] [birth year] 82 y.o. 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 He is doing quite well and has no complaints at this time.  Hgb is now 12.0!!!! He has not noted any blood loss. No bruising or petechiae.  He has occasional dizziness when standing to quickly.  No fever, chills, n/v, cough, rash, SOB, chest pain, palpitations, abdominal pain or changes in bowel or bladder habits.  No swelling, tenderness, numbness or tingling in his extremities.  No falls or syncope reported.  Appetite and hydration are good. Weight is 248 lbs.   ECOG Performance Status: 0 - Asymptomatic  Medications:  Allergies as of 12/15/2022       Reactions   Iohexol Other (See Comments)    Desc: "shuts kidneys down"   Morphine And Related Shortness Of Breath   Hydroxyzine Nausea Only   Fluconazole Rash   And welts   Hydrocodone Nausea And Vomiting   Metoprolol Rash   Sulfa Antibiotics Other (See Comments)   UNKNOWN REACTION   Sulfonamide Derivatives Other (See Comments)   UNKNOWN REACTION   Tramadol Rash   Trazodone Rash, Other (See Comments)   Pain   Trazodone And Nefazodone Rash   Pain   Valtrex [valacyclovir] Itching, Rash        Medication List        Accurate as of December 15, 2022  2:04 PM. If you have any questions, ask your nurse or doctor.          acetaminophen 325 MG tablet Commonly known as: TYLENOL Take 650 mg by mouth every 6 (six) hours as needed for moderate pain.   allopurinol 100 MG tablet Commonly known as: ZYLOPRIM Take 100 mg by mouth daily.   apixaban 2.5 MG Tabs tablet Commonly known as: ELIQUIS Take 1 tablet  (2.5 mg total) by mouth 2 (two) times daily.   Cholecalciferol 125 MCG (5000 UT) Tabs Take 5,000 Units by mouth daily.   cyanocobalamin 1000 MCG tablet Commonly known as: VITAMIN B12 Take 1 tablet by mouth daily.   ferrous sulfate 325 (65 FE) MG tablet Take 1 tablet by mouth daily.   insulin lispro 100 UNIT/ML KwikPen Commonly known as: HUMALOG Inject 20 Units into the skin 3 (three) times daily.   levothyroxine 100 MCG tablet Commonly known as: SYNTHROID Take 50-100 mcg by mouth See admin instructions. Takes 100 mg daily except Sundays, on Sunday takes 50 mg   levothyroxine 100 MCG tablet Commonly known as: SYNTHROID Take 50 mcg by mouth daily before breakfast.   OneTouch Delica Lancets [id number] Misc 4 (four) times daily.   silodosin 8 MG Caps capsule Commonly known as: RAPAFLO Take 8 mg by mouth daily.   simvastatin 20 MG tablet Commonly known as: ZOCOR Take 1 tablet (20 mg total) by mouth at bedtime.   spironolactone 50 MG tablet Commonly known as: ALDACTONE Take 1 tablet (50 mg total) by mouth daily.   torsemide 20 MG tablet Commonly known as: DEMADEX Take 1 tablet (20 mg total) by mouth daily. Take 2nd pill on Monday, Wednesday and Friday   Toujeo SoloStar 300 UNIT/ML Solostar Pen  Generic drug: insulin glargine (1 Unit Dial) Inject 46 Units into the skin daily. Sliding scale        Allergies:  Allergies  Allergen Reactions   Iohexol Other (See Comments)     Desc: "shuts kidneys down"    Morphine And Related Shortness Of Breath   Hydroxyzine Nausea Only   Fluconazole Rash    And welts   Hydrocodone Nausea And Vomiting   Metoprolol Rash   Sulfa Antibiotics Other (See Comments)    UNKNOWN REACTION   Sulfonamide Derivatives Other (See Comments)    UNKNOWN REACTION   Tramadol Rash   Trazodone Rash and Other (See Comments)    Pain   Trazodone And Nefazodone Rash    Pain   Valtrex [Valacyclovir] Itching and Rash    Past Medical History, Surgical  history, Social history, and Family History were reviewed and updated.  Review of Systems: All other 10 point review of systems is negative.   Physical Exam:  weight is 248 lb 12.8 oz (112.9 kg). His oral temperature is 97.5 F (36.4 C) (abnormal). His blood pressure is 137/53 (abnormal) and his pulse is 88. His respiration is 18 and oxygen saturation is 98%.   Wt Readings from Last 3 Encounters:  12/15/22 248 lb 12.8 oz (112.9 kg)  11/24/22 252 lb (114.3 kg)  11/04/22 249 lb 1.3 oz (113 kg)    Ocular: Sclerae unicteric, pupils equal, round and reactive to light Ear-nose-throat: Oropharynx clear, dentition fair Lymphatic: No cervical or supraclavicular adenopathy Lungs no rales or rhonchi, good excursion bilaterally Heart regular rate and rhythm, no murmur appreciated Abd soft, nontender, positive bowel sounds MSK no focal spinal tenderness, no joint edema Neuro: non-focal, well-oriented, appropriate affect Breasts: Deferred   Lab Results  Component Value Date   WBC 5.7 12/15/2022   HGB 12.0 (L) 12/15/2022   HCT 35.8 (L) 12/15/2022   MCV 88.2 12/15/2022   PLT 116 (L) 12/15/2022   Lab Results  Component Value Date   FERRITIN 107 11/04/2022   IRON 51 11/04/2022   TIBC 302 11/04/2022   UIBC 251 11/04/2022   IRONPCTSAT 17 (L) 11/04/2022   Lab Results  Component Value Date   RETICCTPCT 4.1 (H) 11/04/2022   RBC 4.06 (L) 12/15/2022   Lab Results  Component Value Date   KPAFRELGTCHN 49.7 (H) 02/17/2020   LAMBDASER 40.1 (H) 02/17/2020   KAPLAMBRATIO 6.90 04/30/2020   Lab Results  Component Value Date   IGGSERUM 1,267 04/29/2020   IGA 231 04/29/2020   IGMSERUM 36 04/29/2020   Lab Results  Component Value Date   TOTALPROTELP 6.5 04/29/2020   ALBUMINELP 2.9 02/17/2020   A1GS 0.4 02/17/2020   A2GS 0.7 02/17/2020   BETS 0.8 02/17/2020   GAMS 0.7 02/17/2020   MSPIKE 0.3 (H) 02/17/2020   SPEI Comment 02/17/2020     Chemistry      Component Value Date/Time   NA  132 (L) 11/24/2022 1345   K 4.7 11/24/2022 1345   CL 105 11/24/2022 1345   CO2 21 (L) 11/24/2022 1345   BUN 58 (H) 11/24/2022 1345   CREATININE 2.44 (H) 11/24/2022 1345      Component Value Date/Time   CALCIUM 8.8 (L) 11/24/2022 1345   ALKPHOS 103 11/24/2022 1345   AST 18 11/24/2022 1345   ALT 16 11/24/2022 1345   BILITOT 2.5 (H) 11/24/2022 1345       Impression and Plan: Mr. Zoss is a very pleasant 82 yo caucasian gentleman with myelodysplasia --  refractory anemia with ringed sideroblast.   No  Iron studies pending.  Follow-up in 3 weeks.   Lottie Dawson, NP 3/26/20242:04 PM

## 2022-12-22 ENCOUNTER — Other Ambulatory Visit: Payer: Self-pay

## 2023-01-03 ENCOUNTER — Other Ambulatory Visit: Payer: Self-pay

## 2023-01-05 ENCOUNTER — Ambulatory Visit: Payer: Medicare Other | Admitting: Family

## 2023-01-05 ENCOUNTER — Inpatient Hospital Stay: Payer: Medicare Other

## 2023-01-05 ENCOUNTER — Ambulatory Visit: Payer: Medicare Other

## 2023-01-08 ENCOUNTER — Inpatient Hospital Stay (HOSPITAL_BASED_OUTPATIENT_CLINIC_OR_DEPARTMENT_OTHER): Payer: Medicare Other | Admitting: Family

## 2023-01-08 ENCOUNTER — Inpatient Hospital Stay: Payer: Medicare Other | Attending: Hematology & Oncology

## 2023-01-08 ENCOUNTER — Inpatient Hospital Stay: Payer: Medicare Other

## 2023-01-08 DIAGNOSIS — D4620 Refractory anemia with excess of blasts, unspecified: Secondary | ICD-10-CM | POA: Diagnosis not present

## 2023-01-08 DIAGNOSIS — E611 Iron deficiency: Secondary | ICD-10-CM | POA: Diagnosis not present

## 2023-01-08 DIAGNOSIS — D461 Refractory anemia with ring sideroblasts: Secondary | ICD-10-CM

## 2023-01-08 DIAGNOSIS — D509 Iron deficiency anemia, unspecified: Secondary | ICD-10-CM

## 2023-01-08 LAB — CMP (CANCER CENTER ONLY)
ALT: 14 U/L (ref 0–44)
AST: 16 U/L (ref 15–41)
Albumin: 4.1 g/dL (ref 3.5–5.0)
Alkaline Phosphatase: 102 U/L (ref 38–126)
Anion gap: 8 (ref 5–15)
BUN: 47 mg/dL — ABNORMAL HIGH (ref 8–23)
CO2: 25 mmol/L (ref 22–32)
Calcium: 9.1 mg/dL (ref 8.9–10.3)
Chloride: 104 mmol/L (ref 98–111)
Creatinine: 2.36 mg/dL — ABNORMAL HIGH (ref 0.61–1.24)
GFR, Estimated: 27 mL/min — ABNORMAL LOW (ref >60)
Glucose, Bld: 240 mg/dL — ABNORMAL HIGH (ref 70–99)
Potassium: 5.1 mmol/L (ref 3.5–5.1)
Sodium: 137 mmol/L (ref 135–145)
Total Bilirubin: 2.7 mg/dL — ABNORMAL HIGH (ref 0.3–1.2)
Total Protein: 6.6 g/dL (ref 6.5–8.1)

## 2023-01-08 LAB — CBC WITH DIFFERENTIAL (CANCER CENTER ONLY)
Abs Immature Granulocytes: 0.01 10*3/uL (ref 0.00–0.07)
Basophils Absolute: 0 10*3/uL (ref 0.0–0.1)
Basophils Relative: 1 %
Eosinophils Absolute: 0.1 10*3/uL (ref 0.0–0.5)
Eosinophils Relative: 2 %
HCT: 34.9 % — ABNORMAL LOW (ref 39.0–52.0)
Hemoglobin: 11.6 g/dL — ABNORMAL LOW (ref 13.0–17.0)
Immature Granulocytes: 0 %
Lymphocytes Relative: 24 %
Lymphs Abs: 1.3 10*3/uL (ref 0.7–4.0)
MCH: 29.7 pg (ref 26.0–34.0)
MCHC: 33.2 g/dL (ref 30.0–36.0)
MCV: 89.3 fL (ref 80.0–100.0)
Monocytes Absolute: 0.4 10*3/uL (ref 0.1–1.0)
Monocytes Relative: 8 %
Neutro Abs: 3.5 10*3/uL (ref 1.7–7.7)
Neutrophils Relative %: 65 %
Platelet Count: 95 10*3/uL — ABNORMAL LOW (ref 150–400)
RBC: 3.91 MIL/uL — ABNORMAL LOW (ref 4.22–5.81)
RDW: 15.8 % — ABNORMAL HIGH (ref 11.5–15.5)
WBC Count: 5.2 10*3/uL (ref 4.0–10.5)
nRBC: 0 % (ref 0.0–0.2)

## 2023-01-08 NOTE — Progress Notes (Signed)
Hematology and Oncology Follow Up Visit  Eric [name] [id number] Oct 14, 1940 82 y.o. 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 He is doing well and has no complaints at this time.  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  No blood loss noted. No bruising or petechiae.  No fever, chills, n/v, cough, rash, dizziness, SOB, chest pain, palpitations, abdominal pain or changes in bowel or bladder habits.  No swelling, tenderness, numbness or tingling in his extremities.  No falls or syncope.  Appetite and hydration are good. He is trying to increase his protein intake. Hydration is good. Weight is stable at 252 lbs.   ECOG Performance Status: 1 - Symptomatic but completely ambulatory  Medications:  Allergies as of 01/08/2023       Reactions   Iohexol Other (See Comments)    Desc: "shuts kidneys down"   Morphine And Related Shortness Of Breath   Hydroxyzine Nausea Only   Fluconazole Rash   And welts   Hydrocodone Nausea And Vomiting   Metoprolol Rash   Sulfa Antibiotics Other (See Comments)   UNKNOWN REACTION   Sulfonamide Derivatives Other (See Comments)   UNKNOWN REACTION   Tramadol Rash   Trazodone Rash, Other (See Comments)   Pain   Trazodone And Nefazodone Rash   Pain   Valtrex [valacyclovir] Itching, Rash        Medication List        Accurate as of January 08, 2023  1:31 PM. If you have any questions, ask your nurse or doctor.          acetaminophen 325 MG tablet Commonly known as: TYLENOL Take 650 mg by mouth every 6 (six) hours as needed for moderate pain.   allopurinol 100 MG tablet Commonly known as: ZYLOPRIM Take 100 mg by mouth daily.    apixaban 2.5 MG Tabs tablet Commonly known as: ELIQUIS Take 1 tablet (2.5 mg total) by mouth 2 (two) times daily.   Cholecalciferol 125 MCG (5000 UT) Tabs Take 5,000 Units by mouth daily.   cyanocobalamin 1000 MCG tablet Commonly known as: VITAMIN B12 Take 1 tablet by mouth daily.   ferrous sulfate 325 (65 FE) MG tablet Take 1 tablet by mouth daily.   insulin lispro 100 UNIT/ML KwikPen Commonly known as: HUMALOG Inject 20 Units into the skin 3 (three) times daily.   levothyroxine 100 MCG tablet Commonly known as: SYNTHROID Take 50-100 mcg by mouth See admin instructions. Takes 100 mg daily except Sundays, on Sunday takes 50 mg   levothyroxine 100 MCG tablet Commonly known as: SYNTHROID Take 50 mcg by mouth daily before breakfast.   OneTouch Delica Lancets 30G Misc 4 (four) times daily.   silodosin 8 MG Caps capsule Commonly known as: RAPAFLO Take 8 mg by mouth daily.   simvastatin 20 MG tablet Commonly known as: ZOCOR Take 1 tablet (20 mg total) by mouth at bedtime.   spironolactone 50 MG tablet Commonly known as: ALDACTONE Take 1 tablet (50 mg total) by mouth daily.   torsemide 20 MG tablet Commonly known as: DEMADEX Take 1 tablet (20 mg total) by mouth daily. Take 2nd pill  on Monday, Wednesday and Friday   Toujeo SoloStar 300 UNIT/ML Solostar Pen Generic drug: insulin glargine (1 Unit Dial) Inject 46 Units into the skin daily. Sliding scale        Allergies:  Allergies  Allergen Reactions   Iohexol Other (See Comments)     Desc: "shuts kidneys down"    Morphine And Related Shortness Of Breath   Hydroxyzine Nausea Only   Fluconazole Rash    And welts   Hydrocodone Nausea And Vomiting   Metoprolol Rash   Sulfa Antibiotics Other (See Comments)    UNKNOWN REACTION   Sulfonamide Derivatives Other (See Comments)    UNKNOWN REACTION   Tramadol Rash   Trazodone Rash and Other (See Comments)    Pain   Trazodone And Nefazodone Rash    Pain   Valtrex  [Valacyclovir] Itching and Rash    Past Medical History, Surgical history, Social history, and Family History were reviewed and updated.  Review of Systems: All other 10 point review of systems is negative.   Physical Exam:  vitals were not taken for this visit.   Wt Readings from Last 3 Encounters:  12/15/22 248 lb 12.8 oz (112.9 kg)  11/24/22 252 lb (114.3 kg)  11/04/22 249 lb 1.3 oz (113 kg)    Ocular: Sclerae unicteric, pupils equal, round and reactive to light Ear-nose-throat: Oropharynx clear, dentition fair Lymphatic: No cervical or supraclavicular adenopathy Lungs no rales or rhonchi, good excursion bilaterally Heart regular rate and rhythm, no murmur appreciated Abd soft, nontender, positive bowel sounds MSK no focal spinal tenderness, no joint edema Neuro: non-focal, well-oriented, appropriate affect Breasts: Deferred   Lab Results  Component Value Date   WBC 5.2 01/08/2023   HGB 11.6 (L) 01/08/2023   HCT 34.9 (L) 01/08/2023   MCV 89.3 01/08/2023   PLT 95 (L) 01/08/2023   Lab Results  Component Value Date   FERRITIN 107 11/04/2022   IRON 51 11/04/2022   TIBC 302 11/04/2022   UIBC 251 11/04/2022   IRONPCTSAT 17 (L) 11/04/2022   Lab Results  Component Value Date   RETICCTPCT 4.1 (H) 11/04/2022   RBC 3.91 (L) 01/08/2023   Lab Results  Component Value Date   KPAFRELGTCHN 49.7 (H) 02/17/2020   LAMBDASER 40.1 (H) 02/17/2020   KAPLAMBRATIO 6.90 04/30/2020   Lab Results  Component Value Date   IGGSERUM 1,267 04/29/2020   IGA 231 04/29/2020   IGMSERUM 36 04/29/2020   Lab Results  Component Value Date   TOTALPROTELP 6.5 04/29/2020   ALBUMINELP 2.9 02/17/2020   A1GS 0.4 02/17/2020   A2GS 0.7 02/17/2020   BETS 0.8 02/17/2020   GAMS 0.7 02/17/2020   MSPIKE 0.3 (H) 02/17/2020   SPEI Comment 02/17/2020     Chemistry      Component Value Date/Time   NA 134 (L) 12/15/2022 1348   K 4.3 12/15/2022 1348   CL 104 12/15/2022 1348   CO2 21 (L)  12/15/2022 1348   BUN 44 (H) 12/15/2022 1348   CREATININE 2.18 (H) 12/15/2022 1348      Component Value Date/Time   CALCIUM 8.5 (L) 12/15/2022 1348   ALKPHOS 107 12/15/2022 1348   AST 17 12/15/2022 1348   ALT 14 12/15/2022 1348   BILITOT 2.2 (H) 12/15/2022 1348       Impression and Plan: Eric [name] is a very pleasant 82 yo caucasian gentleman with myelodysplasia -- refractory anemia with ringed sideroblast.   No injection needed, Hgb 11.6.  Iron studies pending.  Follow-up in 3 weeks.  Eileen Stanford, NP 4/19/20241:31 PM

## 2023-01-09 ENCOUNTER — Other Ambulatory Visit: Payer: Self-pay

## 2023-01-14 ENCOUNTER — Other Ambulatory Visit: Payer: Self-pay

## 2023-01-16 ENCOUNTER — Other Ambulatory Visit: Payer: Self-pay

## 2023-01-26 ENCOUNTER — Inpatient Hospital Stay: Payer: Medicare Other

## 2023-01-26 ENCOUNTER — Ambulatory Visit: Payer: Medicare Other | Admitting: Family

## 2023-01-26 ENCOUNTER — Ambulatory Visit: Payer: Medicare Other

## 2023-01-28 NOTE — Progress Notes (Signed)
Patient with weight gain. Maintain Reblozyl dose 100 mg per Dr. Myna Hidalgo.

## 2023-02-05 ENCOUNTER — Inpatient Hospital Stay: Payer: Medicare Other

## 2023-02-05 ENCOUNTER — Inpatient Hospital Stay (HOSPITAL_BASED_OUTPATIENT_CLINIC_OR_DEPARTMENT_OTHER): Payer: Medicare Other | Admitting: Family

## 2023-02-05 ENCOUNTER — Encounter: Payer: Self-pay | Admitting: Family

## 2023-02-05 ENCOUNTER — Inpatient Hospital Stay: Payer: Medicare Other | Attending: Hematology & Oncology

## 2023-02-05 VITALS — BP 141/62 | HR 80 | Temp 97.9°F | Resp 18 | Wt 257.1 lb

## 2023-02-05 DIAGNOSIS — Z79899 Other long term (current) drug therapy: Secondary | ICD-10-CM | POA: Diagnosis not present

## 2023-02-05 DIAGNOSIS — B379 Candidiasis, unspecified: Secondary | ICD-10-CM

## 2023-02-05 DIAGNOSIS — D461 Refractory anemia with ring sideroblasts: Secondary | ICD-10-CM

## 2023-02-05 DIAGNOSIS — D509 Iron deficiency anemia, unspecified: Secondary | ICD-10-CM | POA: Diagnosis not present

## 2023-02-05 DIAGNOSIS — D4620 Refractory anemia with excess of blasts, unspecified: Secondary | ICD-10-CM

## 2023-02-05 DIAGNOSIS — E611 Iron deficiency: Secondary | ICD-10-CM | POA: Insufficient documentation

## 2023-02-05 DIAGNOSIS — D518 Other vitamin B12 deficiency anemias: Secondary | ICD-10-CM

## 2023-02-05 LAB — CMP (CANCER CENTER ONLY)
ALT: 10 U/L (ref 0–44)
AST: 12 U/L — ABNORMAL LOW (ref 15–41)
Albumin: 4.3 g/dL (ref 3.5–5.0)
Alkaline Phosphatase: 123 U/L (ref 38–126)
Anion gap: 11 (ref 5–15)
BUN: 56 mg/dL — ABNORMAL HIGH (ref 8–23)
CO2: 25 mmol/L (ref 22–32)
Calcium: 9.5 mg/dL (ref 8.9–10.3)
Chloride: 102 mmol/L (ref 98–111)
Creatinine: 2.49 mg/dL — ABNORMAL HIGH (ref 0.61–1.24)
GFR, Estimated: 25 mL/min — ABNORMAL LOW (ref >60)
Glucose, Bld: 300 mg/dL — ABNORMAL HIGH (ref 70–99)
Potassium: 4.8 mmol/L (ref 3.5–5.1)
Sodium: 138 mmol/L (ref 135–145)
Total Bilirubin: 2.2 mg/dL — ABNORMAL HIGH (ref 0.3–1.2)
Total Protein: 6.6 g/dL (ref 6.5–8.1)

## 2023-02-05 LAB — CBC WITH DIFFERENTIAL (CANCER CENTER ONLY)
Abs Immature Granulocytes: 0.02 10*3/uL (ref 0.00–0.07)
Basophils Absolute: 0 10*3/uL (ref 0.0–0.1)
Basophils Relative: 1 %
Eosinophils Absolute: 0.1 10*3/uL (ref 0.0–0.5)
Eosinophils Relative: 2 %
HCT: 29.7 % — ABNORMAL LOW (ref 39.0–52.0)
Hemoglobin: 9.7 g/dL — ABNORMAL LOW (ref 13.0–17.0)
Immature Granulocytes: 0 %
Lymphocytes Relative: 21 %
Lymphs Abs: 1 10*3/uL (ref 0.7–4.0)
MCH: 29 pg (ref 26.0–34.0)
MCHC: 32.7 g/dL (ref 30.0–36.0)
MCV: 88.7 fL (ref 80.0–100.0)
Monocytes Absolute: 0.4 10*3/uL (ref 0.1–1.0)
Monocytes Relative: 9 %
Neutro Abs: 3.1 10*3/uL (ref 1.7–7.7)
Neutrophils Relative %: 67 %
Platelet Count: 99 10*3/uL — ABNORMAL LOW (ref 150–400)
RBC: 3.35 MIL/uL — ABNORMAL LOW (ref 4.22–5.81)
RDW: 15.7 % — ABNORMAL HIGH (ref 11.5–15.5)
WBC Count: 4.7 10*3/uL (ref 4.0–10.5)
nRBC: 0 % (ref 0.0–0.2)

## 2023-02-05 MED ORDER — DARBEPOETIN ALFA 150 MCG/0.3ML IJ SOSY
150.0000 ug | PREFILLED_SYRINGE | Freq: Once | INTRAMUSCULAR | Status: AC
  Administered 2023-02-05: 150 ug via SUBCUTANEOUS

## 2023-02-05 MED ORDER — NYSTATIN 100000 UNIT/GM EX POWD
1.0000 | Freq: Three times a day (TID) | CUTANEOUS | 1 refills | Status: DC
Start: 2023-02-05 — End: 2023-06-25

## 2023-02-05 MED ORDER — LUSPATERCEPT-AAMT 75 MG ~~LOC~~ SOLR
100.0000 mg | Freq: Once | SUBCUTANEOUS | Status: AC
  Administered 2023-02-05: 100 mg via SUBCUTANEOUS
  Filled 2023-02-05: qty 1.5

## 2023-02-05 NOTE — Addendum Note (Signed)
Addended by: Corena Pilgrim on: 02/05/2023 03:22 PM   Modules accepted: Orders

## 2023-02-05 NOTE — Progress Notes (Signed)
OK to treat with today's platelets and creatinine values from today per Eileen Stanford, NP.

## 2023-02-05 NOTE — Progress Notes (Addendum)
Hematology and Oncology Follow Up Visit  Eric [name] [id number] 02-06-1941 82 y.o. 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 He is feeling fatigued and his appetite is down.  He denies feeling ill. No fever, chills, n/v, cough, dizziness, chest pain, palpitations abdominal pain or changes in bowel or bladder habits.  He has had itching under his breasts for the last week. We will have him try nystatin powder and see if this helps.  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  Blood glucose is not well controlled.  He has not noted any blood loss. No abnormal bruising, No petechiae.  Swelling in his lower extremities unchanged from baseline.  Neuropathy in his feet stable.  No falls or syncope reported.  He is doing his best to stay well hydrated. His weight is 257 lbs.   ECOG Performance Status: 1 - Symptomatic but completely ambulatory  Medications:  Allergies as of 02/05/2023       Reactions   Iohexol Other (See Comments)    Desc: "shuts kidneys down"   Morphine And Codeine Shortness Of Breath   Hydroxyzine Nausea Only   Fluconazole Rash   And welts   Hydrocodone Nausea And Vomiting   Metoprolol Rash   Sulfa Antibiotics Other (See Comments)   UNKNOWN REACTION   Sulfonamide Derivatives Other (See Comments)   UNKNOWN REACTION   Tramadol Rash   Trazodone Rash, Other (See Comments)   Pain   Trazodone And Nefazodone Rash   Pain   Valtrex [valacyclovir] Itching, Rash        Medication List        Accurate as of Feb 05, 2023  2:17 PM. If you have any questions, ask your nurse or doctor.          acetaminophen 325 MG tablet Commonly known as: TYLENOL Take 650 mg by mouth  every 6 (six) hours as needed for moderate pain.   allopurinol 100 MG tablet Commonly known as: ZYLOPRIM Take 100 mg by mouth daily.   apixaban 2.5 MG Tabs tablet Commonly known as: ELIQUIS Take 1 tablet (2.5 mg total) by mouth 2 (two) times daily.   Cholecalciferol 125 MCG (5000 UT) Tabs Take 5,000 Units by mouth daily.   cyanocobalamin 1000 MCG tablet Commonly known as: VITAMIN B12 Take 1 tablet by mouth daily.   ferrous sulfate 325 (65 FE) MG tablet Take 1 tablet by mouth daily.   insulin lispro 100 UNIT/ML KwikPen Commonly known as: HUMALOG Inject 20 Units into the skin 3 (three) times daily.   levothyroxine 100 MCG tablet Commonly known as: SYNTHROID Take 50-100 mcg by mouth See admin instructions. Takes 100 mg daily except Sundays, on Sunday takes 50 mg   levothyroxine 100 MCG tablet Commonly known as: SYNTHROID Take 50 mcg by mouth daily before breakfast.   OneTouch Delica Lancets 30G Misc 4 (four) times daily.   silodosin 8 MG Caps capsule Commonly known as: RAPAFLO Take 8 mg by mouth daily.   simvastatin 20 MG tablet Commonly known as: ZOCOR Take 1 tablet (20 mg total) by mouth at bedtime.   spironolactone 50 MG tablet Commonly known as: ALDACTONE Take 1 tablet (50  mg total) by mouth daily.   torsemide 20 MG tablet Commonly known as: DEMADEX Take 1 tablet (20 mg total) by mouth daily. Take 2nd pill on Monday, Wednesday and Friday   Toujeo SoloStar 300 UNIT/ML Solostar Pen Generic drug: insulin glargine (1 Unit Dial) Inject 46 Units into the skin daily. Sliding scale        Allergies:  Allergies  Allergen Reactions   Iohexol Other (See Comments)     Desc: "shuts kidneys down"    Morphine And Codeine Shortness Of Breath   Hydroxyzine Nausea Only   Fluconazole Rash    And welts   Hydrocodone Nausea And Vomiting   Metoprolol Rash   Sulfa Antibiotics Other (See Comments)    UNKNOWN REACTION   Sulfonamide Derivatives Other (See Comments)     UNKNOWN REACTION   Tramadol Rash   Trazodone Rash and Other (See Comments)    Pain   Trazodone And Nefazodone Rash    Pain   Valtrex [Valacyclovir] Itching and Rash    Past Medical History, Surgical history, Social history, and Family History were reviewed and updated.  Review of Systems: All other 10 point review of systems is negative.   Physical Exam:  vitals were not taken for this visit.   Wt Readings from Last 3 Encounters:  12/15/22 248 lb 12.8 oz (112.9 kg)  11/24/22 252 lb (114.3 kg)  11/04/22 249 lb 1.3 oz (113 kg)    Ocular: Sclerae unicteric, pupils equal, round and reactive to light Ear-nose-throat: Oropharynx clear, dentition fair Lymphatic: No cervical or supraclavicular adenopathy Lungs no rales or rhonchi, good excursion bilaterally Heart regular rate and rhythm, no murmur appreciated Abd soft, nontender, positive bowel sounds MSK no focal spinal tenderness, no joint edema Neuro: non-focal, well-oriented, appropriate affect Breasts: Deferred   Lab Results  Component Value Date   WBC 4.7 02/05/2023   HGB 9.7 (L) 02/05/2023   HCT 29.7 (L) 02/05/2023   MCV 88.7 02/05/2023   PLT 99 (L) 02/05/2023   Lab Results  Component Value Date   FERRITIN 107 11/04/2022   IRON 51 11/04/2022   TIBC 302 11/04/2022   UIBC 251 11/04/2022   IRONPCTSAT 17 (L) 11/04/2022   Lab Results  Component Value Date   RETICCTPCT 4.1 (H) 11/04/2022   RBC 3.35 (L) 02/05/2023   Lab Results  Component Value Date   KPAFRELGTCHN 49.7 (H) 02/17/2020   LAMBDASER 40.1 (H) 02/17/2020   KAPLAMBRATIO 6.90 04/30/2020   Lab Results  Component Value Date   IGGSERUM 1,267 04/29/2020   IGA 231 04/29/2020   IGMSERUM 36 04/29/2020   Lab Results  Component Value Date   TOTALPROTELP 6.5 04/29/2020   ALBUMINELP 2.9 02/17/2020   A1GS 0.4 02/17/2020   A2GS 0.7 02/17/2020   BETS 0.8 02/17/2020   GAMS 0.7 02/17/2020   MSPIKE 0.3 (H) 02/17/2020   SPEI Comment 02/17/2020     Chemistry       Component Value Date/Time   NA 137 01/08/2023 1246   K 5.1 01/08/2023 1246   CL 104 01/08/2023 1246   CO2 25 01/08/2023 1246   BUN 47 (H) 01/08/2023 1246   CREATININE 2.36 (H) 01/08/2023 1246      Component Value Date/Time   CALCIUM 9.1 01/08/2023 1246   ALKPHOS 102 01/08/2023 1246   AST 16 01/08/2023 1246   ALT 14 01/08/2023 1246   BILITOT 2.7 (H) 01/08/2023 1246       Impression and Plan: Eric [name] is a very pleasant  82 yo caucasian gentleman with myelodysplasia -- refractory anemia with ringed sideroblast.   He received Luspatercept and ESA today for Hgb 9.7.   Lab and injection in 3 weeks, follow-up in 6 weeks.   Eileen Stanford, NP 5/17/20242:17 PM

## 2023-02-05 NOTE — Patient Instructions (Signed)
Luspatercept Injection What is this medication? LUSPATERCEPT (lus PAT er sept) treats low levels of red blood cells (anemia) in the body in people with beta thalassemia or myelodysplastic syndromes. It works by helping the body make more red blood cells. This medicine may be used for other purposes; ask your health care provider or pharmacist if you have questions. COMMON BRAND NAME(S): REBLOZYL What should I tell my care team before I take this medication? They need to know if you have any of these conditions: Have had your spleen removed High blood pressure History of blood clots Tobacco use An unusual or allergic reaction to luspatercept, other medications, foods, dyes or preservatives Pregnant or trying to get pregnant Breast-feeding How should I use this medication? This medication is for injection under the skin. It is given by your care team in a hospital or clinic setting. Talk to your care team about the use of the medication in children. This medication is not approved for use in children. Overdosage: If you think you have taken too much of this medicine contact a poison control center or emergency room at once. NOTE: This medicine is only for you. Do not share this medicine with others. What if I miss a dose? Keep appointments for follow-up doses. It is important not to miss your dose. Call your care team if you are unable to keep an appointment. What may interact with this medication? Interactions are not expected. This list may not describe all possible interactions. Give your health care provider a list of all the medicines, herbs, non-prescription drugs, or dietary supplements you use. Also tell them if you smoke, drink alcohol, or use illegal drugs. Some items may interact with your medicine. What should I watch for while using this medication? Your condition will be monitored carefully while you are receiving this medication. Talk to your care team if you wish to become  pregnant or think you might be pregnant. This medication can cause serious birth defects. Discuss contraceptive options with your care team. Do not breastfeed while taking this medication. You may need blood work done while you are taking this medication. What side effects may I notice from receiving this medication? Side effects that you should report to your care team as soon as possible: Allergic reactions--skin rash, itching, hives, swelling of the face, lips, tongue, or throat Blood clot--pain, swelling, or warmth in the leg, shortness of breath, chest pain Increase in blood pressure Severe back pain, numbness or weakness of the hands, arms, legs, or feet, loss of coordination, loss of bowel or bladder control Side effects that usually do not require medical attention (report these to your care team if they continue or are bothersome): Bone pain Dizziness Fatigue Headache Joint pain Muscle pain Stomach pain This list may not describe all possible side effects. Call your doctor for medical advice about side effects. You may report side effects to FDA at 1-800-FDA-1088. Where should I keep my medication? This medication is given in a hospital or clinic. It will not be stored at home. NOTE: This sheet is a summary. It may not cover all possible information. If you have questions about this medicine, talk to your doctor, pharmacist, or health care provider.  2023 Elsevier/Gold Standard (2021-04-04 00:00:00) Darbepoetin Alfa Injection What is this medication? DARBEPOETIN ALFA (dar be POE e tin AL fa) treats low levels of red blood cells (anemia) caused by kidney disease or chemotherapy. It works by helping your body make more red blood cells, which reduces   the need for blood transfusions. This medicine may be used for other purposes; ask your health care provider or pharmacist if you have questions. COMMON BRAND NAME(S): Aranesp What should I tell my care team before I take this  medication? They need to know if you have any of these conditions: Blood clots Cancer Heart disease High blood pressure On dialysis Seizures Stroke An unusual or allergic reaction to darbepoetin, latex, other medications, foods, dyes, or preservatives Pregnant or trying to get pregnant Breast-feeding How should I use this medication? This medication is injected into a vein or under the skin. It is usually given by a care team in a hospital or clinic setting. It may also be given at home. If you get this medication at home, you will be taught how to prepare and give it. Use exactly as directed. Take it as directed on the prescription label at the same time every day. Keep taking it unless your care team tells you to stop. It is important that you put your used needles and syringes in a special sharps container. Do not put them in a trash can. If you do not have a sharps container, call your pharmacist or care team to get one. A special MedGuide will be given to you by the pharmacist with each prescription and refill. Be sure to read this information carefully each time. Talk to your care team about the use of this medication in children. While this medication may be used in children as young as 1 month of age for selected conditions, precautions do apply. Overdosage: If you think you have taken too much of this medicine contact a poison control center or emergency room at once. NOTE: This medicine is only for you. Do not share this medicine with others. What if I miss a dose? If you miss a dose, take it as soon as you can. If it is almost time for your next dose, take only that dose. Do not take double or extra doses. What may interact with this medication? Epoetin alfa Methoxy polyethylene glycol-epoetin beta This list may not describe all possible interactions. Give your health care provider a list of all the medicines, herbs, non-prescription drugs, or dietary supplements you use. Also  tell them if you smoke, drink alcohol, or use illegal drugs. Some items may interact with your medicine. What should I watch for while using this medication? Visit your care team for regular checks on your progress. Check your blood pressure as directed. Know what your blood pressure should be and when to contact your care team. Your condition will be monitored carefully while you are receiving this medication. You may need blood work while taking this medication. What side effects may I notice from receiving this medication? Side effects that you should report to your care team as soon as possible: Allergic reactions--skin rash, itching, hives, swelling of the face, lips, tongue, or throat Blood clot--pain, swelling, or warmth in the leg, shortness of breath, chest pain Heart attack--pain or tightness in the chest, shoulders, arms, or jaw, nausea, shortness of breath, cold or clammy skin, feeling faint or lightheaded Increase in blood pressure Rash, fever, and swollen lymph nodes Redness, blistering, peeling, or loosening of the skin, including inside the mouth Seizures Stroke--sudden numbness or weakness of the face, arm, or leg, trouble speaking, confusion, trouble walking, loss of balance or coordination, dizziness, severe headache, change in vision Side effects that usually do not require medical attention (report to your care team if they   continue or are bothersome): Cough Stomach pain Swelling of the ankles, hands, or feet This list may not describe all possible side effects. Call your doctor for medical advice about side effects. You may report side effects to FDA at 1-800-FDA-1088. Where should I keep my medication? Keep out of the reach of children and pets. Store in a refrigerator. Do not freeze. Do not shake. Protect from light. Keep this medication in the original container until you are ready to take it. See product for storage information. Get rid of any unused medication after  the expiration date. To get rid of medications that are no longer needed or have expired: Take the medication to a medication take-back program. Check with your pharmacy or law enforcement to find a location. If you cannot return the medication, ask your pharmacist or care team how to get rid of the medication safely. NOTE: This sheet is a summary. It may not cover all possible information. If you have questions about this medicine, talk to your doctor, pharmacist, or health care provider.  2023 Elsevier/Gold Standard (2021-12-16 00:00:00)  

## 2023-02-06 ENCOUNTER — Other Ambulatory Visit: Payer: Self-pay

## 2023-02-26 ENCOUNTER — Inpatient Hospital Stay: Payer: Medicare Other | Attending: Hematology & Oncology

## 2023-02-26 ENCOUNTER — Inpatient Hospital Stay: Payer: Medicare Other

## 2023-02-26 VITALS — BP 134/73 | HR 78 | Temp 97.6°F | Resp 20

## 2023-02-26 DIAGNOSIS — D4620 Refractory anemia with excess of blasts, unspecified: Secondary | ICD-10-CM

## 2023-02-26 DIAGNOSIS — D461 Refractory anemia with ring sideroblasts: Secondary | ICD-10-CM | POA: Insufficient documentation

## 2023-02-26 DIAGNOSIS — Z79899 Other long term (current) drug therapy: Secondary | ICD-10-CM | POA: Insufficient documentation

## 2023-02-26 DIAGNOSIS — D518 Other vitamin B12 deficiency anemias: Secondary | ICD-10-CM

## 2023-02-26 MED ORDER — LUSPATERCEPT-AAMT 75 MG ~~LOC~~ SOLR
100.0000 mg | Freq: Once | SUBCUTANEOUS | Status: AC
  Administered 2023-02-26: 100 mg via SUBCUTANEOUS
  Filled 2023-02-26: qty 1.5

## 2023-02-26 MED ORDER — DARBEPOETIN ALFA 150 MCG/0.3ML IJ SOSY
150.0000 ug | PREFILLED_SYRINGE | Freq: Once | INTRAMUSCULAR | Status: AC
  Administered 2023-02-26: 150 ug via SUBCUTANEOUS

## 2023-02-26 NOTE — Patient Instructions (Signed)
Luspatercept Injection What is this medication? LUSPATERCEPT (lus PAT er sept) treats low levels of red blood cells (anemia) in the body in people with beta thalassemia or myelodysplastic syndromes. It works by helping the body make more red blood cells. This medicine may be used for other purposes; ask your health care provider or pharmacist if you have questions. COMMON BRAND NAME(S): REBLOZYL What should I tell my care team before I take this medication? They need to know if you have any of these conditions: Have had your spleen removed High blood pressure History of blood clots Tobacco use An unusual or allergic reaction to luspatercept, other medications, foods, dyes or preservatives Pregnant or trying to get pregnant Breast-feeding How should I use this medication? This medication is for injection under the skin. It is given by your care team in a hospital or clinic setting. Talk to your care team about the use of the medication in children. This medication is not approved for use in children. Overdosage: If you think you have taken too much of this medicine contact a poison control center or emergency room at once. NOTE: This medicine is only for you. Do not share this medicine with others. What if I miss a dose? Keep appointments for follow-up doses. It is important not to miss your dose. Call your care team if you are unable to keep an appointment. What may interact with this medication? Interactions are not expected. This list may not describe all possible interactions. Give your health care provider a list of all the medicines, herbs, non-prescription drugs, or dietary supplements you use. Also tell them if you smoke, drink alcohol, or use illegal drugs. Some items may interact with your medicine. What should I watch for while using this medication? Your condition will be monitored carefully while you are receiving this medication. Talk to your care team if you wish to become  pregnant or think you might be pregnant. This medication can cause serious birth defects. Discuss contraceptive options with your care team. Do not breastfeed while taking this medication. You may need blood work done while you are taking this medication. What side effects may I notice from receiving this medication? Side effects that you should report to your care team as soon as possible: Allergic reactions--skin rash, itching, hives, swelling of the face, lips, tongue, or throat Blood clot--pain, swelling, or warmth in the leg, shortness of breath, chest pain Increase in blood pressure Severe back pain, numbness or weakness of the hands, arms, legs, or feet, loss of coordination, loss of bowel or bladder control Side effects that usually do not require medical attention (report these to your care team if they continue or are bothersome): Bone pain Dizziness Fatigue Headache Joint pain Muscle pain Stomach pain This list may not describe all possible side effects. Call your doctor for medical advice about side effects. You may report side effects to FDA at 1-800-FDA-1088. Where should I keep my medication? This medication is given in a hospital or clinic. It will not be stored at home. NOTE: This sheet is a summary. It may not cover all possible information. If you have questions about this medicine, talk to your doctor, pharmacist, or health care provider.  2024 Elsevier/Gold Standard (2021-04-04 00:00:00) Darbepoetin Alfa Injection What is this medication? DARBEPOETIN ALFA (dar be POE e tin AL fa) treats low levels of red blood cells (anemia) caused by kidney disease or chemotherapy. It works by Systems analyst make more red blood cells, which reduces  the need for blood transfusions. This medicine may be used for other purposes; ask your health care provider or pharmacist if you have questions. COMMON BRAND NAME(S): Aranesp What should I tell my care team before I take this  medication? They need to know if you have any of these conditions: Blood clots Cancer Heart disease High blood pressure On dialysis Seizures Stroke An unusual or allergic reaction to darbepoetin, latex, other medications, foods, dyes, or preservatives Pregnant or trying to get pregnant Breast-feeding How should I use this medication? This medication is injected into a vein or under the skin. It is usually given by a care team in a hospital or clinic setting. It may also be given at home. If you get this medication at home, you will be taught how to prepare and give it. Use exactly as directed. Take it as directed on the prescription label at the same time every day. Keep taking it unless your care team tells you to stop. It is important that you put your used needles and syringes in a special sharps container. Do not put them in a trash can. If you do not have a sharps container, call your pharmacist or care team to get one. A special MedGuide will be given to you by the pharmacist with each prescription and refill. Be sure to read this information carefully each time. Talk to your care team about the use of this medication in children. While this medication may be used in children as young as 1 month of age for selected conditions, precautions do apply. Overdosage: If you think you have taken too much of this medicine contact a poison control center or emergency room at once. NOTE: This medicine is only for you. Do not share this medicine with others. What if I miss a dose? If you miss a dose, take it as soon as you can. If it is almost time for your next dose, take only that dose. Do not take double or extra doses. What may interact with this medication? Epoetin alfa Methoxy polyethylene glycol-epoetin beta This list may not describe all possible interactions. Give your health care provider a list of all the medicines, herbs, non-prescription drugs, or dietary supplements you use. Also  tell them if you smoke, drink alcohol, or use illegal drugs. Some items may interact with your medicine. What should I watch for while using this medication? Visit your care team for regular checks on your progress. Check your blood pressure as directed. Know what your blood pressure should be and when to contact your care team. Your condition will be monitored carefully while you are receiving this medication. You may need blood work while taking this medication. What side effects may I notice from receiving this medication? Side effects that you should report to your care team as soon as possible: Allergic reactions--skin rash, itching, hives, swelling of the face, lips, tongue, or throat Blood clot--pain, swelling, or warmth in the leg, shortness of breath, chest pain Heart attack--pain or tightness in the chest, shoulders, arms, or jaw, nausea, shortness of breath, cold or clammy skin, feeling faint or lightheaded Increase in blood pressure Rash, fever, and swollen lymph nodes Redness, blistering, peeling, or loosening of the skin, including inside the mouth Seizures Stroke--sudden numbness or weakness of the face, arm, or leg, trouble speaking, confusion, trouble walking, loss of balance or coordination, dizziness, severe headache, change in vision Side effects that usually do not require medical attention (report to your care team if they  continue or are bothersome): Cough Stomach pain Swelling of the ankles, hands, or feet This list may not describe all possible side effects. Call your doctor for medical advice about side effects. You may report side effects to FDA at 1-800-FDA-1088. Where should I keep my medication? Keep out of the reach of children and pets. Store in a refrigerator. Do not freeze. Do not shake. Protect from light. Keep this medication in the original container until you are ready to take it. See product for storage information. Get rid of any unused medication after  the expiration date. To get rid of medications that are no longer needed or have expired: Take the medication to a medication take-back program. Check with your pharmacy or law enforcement to find a location. If you cannot return the medication, ask your pharmacist or care team how to get rid of the medication safely. NOTE: This sheet is a summary. It may not cover all possible information. If you have questions about this medicine, talk to your doctor, pharmacist, or health care provider.  2024 Elsevier/Gold Standard (2022-01-07 00:00:00)

## 2023-03-05 ENCOUNTER — Other Ambulatory Visit: Payer: Self-pay

## 2023-03-19 ENCOUNTER — Encounter: Payer: Self-pay | Admitting: Hematology & Oncology

## 2023-03-19 ENCOUNTER — Inpatient Hospital Stay: Payer: Medicare Other

## 2023-03-19 ENCOUNTER — Inpatient Hospital Stay (HOSPITAL_BASED_OUTPATIENT_CLINIC_OR_DEPARTMENT_OTHER): Payer: Medicare Other | Admitting: Hematology & Oncology

## 2023-03-19 ENCOUNTER — Other Ambulatory Visit: Payer: Self-pay

## 2023-03-19 VITALS — BP 117/67 | HR 66 | Temp 97.8°F | Resp 19 | Ht 72.0 in | Wt 249.0 lb

## 2023-03-19 DIAGNOSIS — D4620 Refractory anemia with excess of blasts, unspecified: Secondary | ICD-10-CM

## 2023-03-19 DIAGNOSIS — D509 Iron deficiency anemia, unspecified: Secondary | ICD-10-CM

## 2023-03-19 DIAGNOSIS — D461 Refractory anemia with ring sideroblasts: Secondary | ICD-10-CM

## 2023-03-19 LAB — CBC WITH DIFFERENTIAL (CANCER CENTER ONLY)
Abs Immature Granulocytes: 0.03 10*3/uL (ref 0.00–0.07)
Basophils Absolute: 0.1 10*3/uL (ref 0.0–0.1)
Basophils Relative: 1 %
Eosinophils Absolute: 0.1 10*3/uL (ref 0.0–0.5)
Eosinophils Relative: 3 %
HCT: 33.9 % — ABNORMAL LOW (ref 39.0–52.0)
Hemoglobin: 11 g/dL — ABNORMAL LOW (ref 13.0–17.0)
Immature Granulocytes: 1 %
Lymphocytes Relative: 25 %
Lymphs Abs: 1.2 10*3/uL (ref 0.7–4.0)
MCH: 29.3 pg (ref 26.0–34.0)
MCHC: 32.4 g/dL (ref 30.0–36.0)
MCV: 90.4 fL (ref 80.0–100.0)
Monocytes Absolute: 0.4 10*3/uL (ref 0.1–1.0)
Monocytes Relative: 9 %
Neutro Abs: 3 10*3/uL (ref 1.7–7.7)
Neutrophils Relative %: 61 %
Platelet Count: 107 10*3/uL — ABNORMAL LOW (ref 150–400)
RBC: 3.75 MIL/uL — ABNORMAL LOW (ref 4.22–5.81)
RDW: 17.5 % — ABNORMAL HIGH (ref 11.5–15.5)
WBC Count: 4.8 10*3/uL (ref 4.0–10.5)
nRBC: 0 % (ref 0.0–0.2)

## 2023-03-19 LAB — CMP (CANCER CENTER ONLY)
ALT: 7 U/L (ref 0–44)
AST: 15 U/L (ref 15–41)
Albumin: 4.2 g/dL (ref 3.5–5.0)
Alkaline Phosphatase: 110 U/L (ref 38–126)
Anion gap: 10 (ref 5–15)
BUN: 49 mg/dL — ABNORMAL HIGH (ref 8–23)
CO2: 24 mmol/L (ref 22–32)
Calcium: 10.2 mg/dL (ref 8.9–10.3)
Chloride: 106 mmol/L (ref 98–111)
Creatinine: 2.46 mg/dL — ABNORMAL HIGH (ref 0.61–1.24)
GFR, Estimated: 26 mL/min — ABNORMAL LOW (ref >60)
Glucose, Bld: 177 mg/dL — ABNORMAL HIGH (ref 70–99)
Potassium: 4.3 mmol/L (ref 3.5–5.1)
Sodium: 140 mmol/L (ref 135–145)
Total Bilirubin: 2.7 mg/dL — ABNORMAL HIGH (ref 0.3–1.2)
Total Protein: 7 g/dL (ref 6.5–8.1)

## 2023-03-19 LAB — IRON AND IRON BINDING CAPACITY (CC-WL,HP ONLY)
Iron: 73 ug/dL (ref 45–182)
Saturation Ratios: 20 % (ref 17.9–39.5)
TIBC: 370 ug/dL (ref 250–450)
UIBC: 297 ug/dL (ref 117–376)

## 2023-03-19 LAB — FERRITIN: Ferritin: 61 ng/mL (ref 24–336)

## 2023-03-19 MED ORDER — LUSPATERCEPT-AAMT 75 MG ~~LOC~~ SOLR
100.0000 mg | Freq: Once | SUBCUTANEOUS | Status: AC
  Administered 2023-03-19: 100 mg via SUBCUTANEOUS
  Filled 2023-03-19: qty 1.5

## 2023-03-19 NOTE — Progress Notes (Signed)
proceed with Luspatercept at 3 weeks vs 4 weeks per Dr Myna Hidalgo. dph

## 2023-03-19 NOTE — Progress Notes (Signed)
Hematology and Oncology Follow Up Visit  Eric [name] [id number] 09-13-1941 82 y.o. 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  We last saw him back in May.  He seems to be managing fairly well.  He does get around with a cane.  His wife comes in with him.  He is eating okay.  He has had no change in bowel or bladder habits.  He has chronic diarrhea.  This is been going on for years.  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  This is really helped him.  He has had iron studies back in February.  At that time, his ferritin was 107 with an iron saturation of 17%.  He has had no cough.  He does have some shortness of breath on occasion.  He does have some episodes of congestive heart failure.  Currently, I would say his performance status is probably ECOG 2-3.   Medications:  Allergies as of 03/19/2023       Reactions   Iohexol Other (See Comments)    Desc: "shuts kidneys down"   Morphine And Codeine Shortness Of Breath   Hydroxyzine Nausea Only   Fluconazole Rash   And welts   Hydrocodone Nausea And Vomiting   Metoprolol Rash   Sulfa Antibiotics Other (See Comments)   UNKNOWN REACTION   Sulfonamide Derivatives Other (See Comments)   UNKNOWN REACTION   Tramadol Rash   Trazodone Rash, Other (See Comments)   Pain   Trazodone And Nefazodone Rash   Pain   Valtrex [valacyclovir] Itching, Rash        Medication List        Accurate as of March 19, 2023  2:12 PM. If you have any questions, ask your nurse or doctor.          acetaminophen 325 MG tablet Commonly known as: TYLENOL Take 650 mg by mouth every 6 (six) hours as needed for moderate pain.    allopurinol 100 MG tablet Commonly known as: ZYLOPRIM Take 100 mg by mouth daily.   apixaban 2.5 MG Tabs tablet Commonly known as: ELIQUIS Take 1 tablet (2.5 mg total) by mouth 2 (two) times daily.   Cholecalciferol 125 MCG (5000 UT) Tabs Take 5,000 Units by mouth daily.   cyanocobalamin 1000 MCG tablet Commonly known as: VITAMIN B12 Take 1 tablet by mouth daily.   ferrous sulfate 325 (65 FE) MG tablet Take 1 tablet by mouth daily.   insulin lispro 100 UNIT/ML KwikPen Commonly known as: HUMALOG Inject 20 Units into the skin 3 (three) times daily.   levothyroxine 100 MCG tablet Commonly known as: SYNTHROID Take 50-100 mcg by mouth See admin instructions. Takes 100 mg daily except Sundays, on Sunday takes 50 mg   levothyroxine 100 MCG tablet Commonly known as: SYNTHROID Take 50 mcg by mouth daily before breakfast.   nystatin powder Commonly known as: MYCOSTATIN/NYSTOP Apply 1 Application topically 3 (three) times daily.   OneTouch Delica Lancets 30G Misc 4 (four) times daily.   silodosin 8 MG Caps capsule Commonly known as: RAPAFLO Take 8 mg by mouth daily.   simvastatin 20 MG tablet Commonly  known as: ZOCOR Take 1 tablet (20 mg total) by mouth at bedtime.   spironolactone 50 MG tablet Commonly known as: ALDACTONE Take 1 tablet (50 mg total) by mouth daily.   torsemide 20 MG tablet Commonly known as: DEMADEX Take 1 tablet (20 mg total) by mouth daily. Take 2nd pill on Monday, Wednesday and Friday   Toujeo SoloStar 300 UNIT/ML Solostar Pen Generic drug: insulin glargine (1 Unit Dial) Inject 46 Units into the skin daily. Sliding scale        Allergies:  Allergies  Allergen Reactions   Iohexol Other (See Comments)     Desc: "shuts kidneys down"    Morphine And Codeine Shortness Of Breath   Hydroxyzine Nausea Only   Fluconazole Rash    And welts   Hydrocodone Nausea And Vomiting   Metoprolol Rash   Sulfa Antibiotics Other (See Comments)    UNKNOWN  REACTION   Sulfonamide Derivatives Other (See Comments)    UNKNOWN REACTION   Tramadol Rash   Trazodone Rash and Other (See Comments)    Pain   Trazodone And Nefazodone Rash    Pain   Valtrex [Valacyclovir] Itching and Rash    Past Medical History, Surgical history, Social history, and Family History were reviewed and updated.  Review of Systems: All other 10 point review of systems is negative.   Physical Exam:  height is 6' (1.829 m) and weight is 249 lb 0.6 oz (113 kg). His oral temperature is 97.8 F (36.6 C). His blood pressure is 117/67 and his pulse is 66. His respiration is 19 and oxygen saturation is 100%.   Wt Readings from Last 3 Encounters:  03/19/23 249 lb 0.6 oz (113 kg)  02/05/23 257 lb 1.9 oz (116.6 kg)  12/15/22 248 lb 12.8 oz (112.9 kg)    Physical Exam Vitals reviewed.  HENT:     Head: Normocephalic and atraumatic.  Eyes:     Pupils: Pupils are equal, round, and reactive to light.  Cardiovascular:     Rate and Rhythm: Normal rate and regular rhythm.     Heart sounds: Normal heart sounds.     Comments: Cardiac exam is regular rate and rhythm.  He does have 2/6 systolic ejection murmur. Pulmonary:     Effort: Pulmonary effort is normal.     Breath sounds: Normal breath sounds.  Abdominal:     General: Bowel sounds are normal.     Palpations: Abdomen is soft.  Musculoskeletal:        General: No tenderness or deformity. Normal range of motion.     Cervical back: Normal range of motion.     Comments: His extremities does show some 2+ edema.  He does have some compression stockings on.  Lymphadenopathy:     Cervical: No cervical adenopathy.  Skin:    General: Skin is warm and dry.     Findings: No erythema or rash.  Neurological:     Mental Status: He is alert and oriented to person, place, and time.  Psychiatric:        Behavior: Behavior normal.        Thought Content: Thought content normal.        Judgment: Judgment normal.      Lab  Results  Component Value Date   WBC 4.8 03/19/2023   HGB 11.0 (L) 03/19/2023   HCT 33.9 (L) 03/19/2023   MCV 90.4 03/19/2023   PLT 107 (L) 03/19/2023   Lab Results  Component Value Date  FERRITIN 107 11/04/2022   IRON 51 11/04/2022   TIBC 302 11/04/2022   UIBC 251 11/04/2022   IRONPCTSAT 17 (L) 11/04/2022   Lab Results  Component Value Date   RETICCTPCT 4.1 (H) 11/04/2022   RBC 3.75 (L) 03/19/2023   Lab Results  Component Value Date   KPAFRELGTCHN 49.7 (H) 02/17/2020   LAMBDASER 40.1 (H) 02/17/2020   KAPLAMBRATIO 6.90 04/30/2020   Lab Results  Component Value Date   IGGSERUM 1,267 04/29/2020   IGA 231 04/29/2020   IGMSERUM 36 04/29/2020   Lab Results  Component Value Date   TOTALPROTELP 6.5 04/29/2020   ALBUMINELP 2.9 02/17/2020   A1GS 0.4 02/17/2020   A2GS 0.7 02/17/2020   BETS 0.8 02/17/2020   GAMS 0.7 02/17/2020   MSPIKE 0.3 (H) 02/17/2020   SPEI Comment 02/17/2020     Chemistry      Component Value Date/Time   NA 140 03/19/2023 1312   K 4.3 03/19/2023 1312   CL 106 03/19/2023 1312   CO2 24 03/19/2023 1312   BUN 49 (H) 03/19/2023 1312   CREATININE 2.46 (H) 03/19/2023 1312      Component Value Date/Time   CALCIUM 10.2 03/19/2023 1312   ALKPHOS 110 03/19/2023 1312   AST 15 03/19/2023 1312   ALT 7 03/19/2023 1312   BILITOT 2.7 (H) 03/19/2023 1312       Impression and Plan: Mr. Dimarco is a very pleasant 82 yo caucasian gentleman with myelodysplasia -- refractory anemia with ringed sideroblast.   We will go ahead and give him some Luspatercept today.  I really think this is going to be quite helpful for him.  He does not need any Retacrit today.  He will continue on the aspirin.  He is also on low-dose of Eliquis.  We will plan to get him back in 4 weeks.    Josph Macho, MD 6/28/20242:12 PM

## 2023-03-19 NOTE — Patient Instructions (Signed)
Luspatercept Injection What is this medication? LUSPATERCEPT (lus PAT er sept) treats low levels of red blood cells (anemia) in the body in people with beta thalassemia or myelodysplastic syndromes. It works by helping the body make more red blood cells. This medicine may be used for other purposes; ask your health care provider or pharmacist if you have questions. COMMON BRAND NAME(S): REBLOZYL What should I tell my care team before I take this medication? They need to know if you have any of these conditions: Have had your spleen removed High blood pressure History of blood clots Tobacco use An unusual or allergic reaction to luspatercept, other medications, foods, dyes or preservatives Pregnant or trying to get pregnant Breast-feeding How should I use this medication? This medication is for injection under the skin. It is given by your care team in a hospital or clinic setting. Talk to your care team about the use of the medication in children. This medication is not approved for use in children. Overdosage: If you think you have taken too much of this medicine contact a poison control center or emergency room at once. NOTE: This medicine is only for you. Do not share this medicine with others. What if I miss a dose? Keep appointments for follow-up doses. It is important not to miss your dose. Call your care team if you are unable to keep an appointment. What may interact with this medication? Interactions are not expected. This list may not describe all possible interactions. Give your health care provider a list of all the medicines, herbs, non-prescription drugs, or dietary supplements you use. Also tell them if you smoke, drink alcohol, or use illegal drugs. Some items may interact with your medicine. What should I watch for while using this medication? Your condition will be monitored carefully while you are receiving this medication. Talk to your care team if you wish to become  pregnant or think you might be pregnant. This medication can cause serious birth defects. Discuss contraceptive options with your care team. Do not breastfeed while taking this medication. You may need blood work done while you are taking this medication. What side effects may I notice from receiving this medication? Side effects that you should report to your care team as soon as possible: Allergic reactions--skin rash, itching, hives, swelling of the face, lips, tongue, or throat Blood clot--pain, swelling, or warmth in the leg, shortness of breath, chest pain Increase in blood pressure Severe back pain, numbness or weakness of the hands, arms, legs, or feet, loss of coordination, loss of bowel or bladder control Side effects that usually do not require medical attention (report these to your care team if they continue or are bothersome): Bone pain Dizziness Fatigue Headache Joint pain Muscle pain Stomach pain This list may not describe all possible side effects. Call your doctor for medical advice about side effects. You may report side effects to FDA at 1-800-FDA-1088. Where should I keep my medication? This medication is given in a hospital or clinic. It will not be stored at home. NOTE: This sheet is a summary. It may not cover all possible information. If you have questions about this medicine, talk to your doctor, pharmacist, or health care provider.  2024 Elsevier/Gold Standard (2021-04-04 00:00:00)  

## 2023-03-21 ENCOUNTER — Other Ambulatory Visit: Payer: Self-pay

## 2023-03-23 ENCOUNTER — Other Ambulatory Visit: Payer: Self-pay

## 2023-04-06 ENCOUNTER — Other Ambulatory Visit: Payer: Self-pay

## 2023-04-16 ENCOUNTER — Inpatient Hospital Stay: Payer: Medicare Other

## 2023-04-16 ENCOUNTER — Encounter: Payer: Self-pay | Admitting: Hematology & Oncology

## 2023-04-16 ENCOUNTER — Other Ambulatory Visit: Payer: Self-pay

## 2023-04-16 ENCOUNTER — Inpatient Hospital Stay: Payer: Medicare Other | Admitting: Hematology & Oncology

## 2023-04-20 ENCOUNTER — Inpatient Hospital Stay: Payer: Medicare Other | Attending: Hematology & Oncology

## 2023-04-20 ENCOUNTER — Inpatient Hospital Stay: Payer: Medicare Other

## 2023-04-20 ENCOUNTER — Inpatient Hospital Stay: Payer: Medicare Other | Admitting: Medical Oncology

## 2023-04-21 ENCOUNTER — Other Ambulatory Visit: Payer: Self-pay

## 2023-04-26 ENCOUNTER — Other Ambulatory Visit: Payer: Self-pay | Admitting: Hematology & Oncology

## 2023-04-26 DIAGNOSIS — D461 Refractory anemia with ring sideroblasts: Secondary | ICD-10-CM

## 2023-04-26 DIAGNOSIS — D4620 Refractory anemia with excess of blasts, unspecified: Secondary | ICD-10-CM

## 2023-04-27 ENCOUNTER — Inpatient Hospital Stay: Payer: Medicare Other

## 2023-04-27 ENCOUNTER — Encounter: Payer: Self-pay | Admitting: Medical Oncology

## 2023-04-27 ENCOUNTER — Inpatient Hospital Stay: Payer: Medicare Other | Attending: Hematology & Oncology

## 2023-04-27 ENCOUNTER — Inpatient Hospital Stay (HOSPITAL_BASED_OUTPATIENT_CLINIC_OR_DEPARTMENT_OTHER): Payer: Medicare Other | Admitting: Medical Oncology

## 2023-04-27 ENCOUNTER — Other Ambulatory Visit: Payer: Self-pay

## 2023-04-27 ENCOUNTER — Inpatient Hospital Stay: Payer: Medicare Other | Admitting: Medical Oncology

## 2023-04-27 DIAGNOSIS — D4620 Refractory anemia with excess of blasts, unspecified: Secondary | ICD-10-CM

## 2023-04-27 DIAGNOSIS — Z79899 Other long term (current) drug therapy: Secondary | ICD-10-CM | POA: Diagnosis not present

## 2023-04-27 DIAGNOSIS — D461 Refractory anemia with ring sideroblasts: Secondary | ICD-10-CM | POA: Insufficient documentation

## 2023-04-27 DIAGNOSIS — E611 Iron deficiency: Secondary | ICD-10-CM | POA: Diagnosis not present

## 2023-04-27 DIAGNOSIS — D518 Other vitamin B12 deficiency anemias: Secondary | ICD-10-CM

## 2023-04-27 LAB — CBC WITH DIFFERENTIAL (CANCER CENTER ONLY)
Abs Immature Granulocytes: 0.05 10*3/uL (ref 0.00–0.07)
Basophils Absolute: 0 10*3/uL (ref 0.0–0.1)
Basophils Relative: 1 %
Eosinophils Absolute: 0.1 10*3/uL (ref 0.0–0.5)
Eosinophils Relative: 1 %
HCT: 34 % — ABNORMAL LOW (ref 39.0–52.0)
Hemoglobin: 10.9 g/dL — ABNORMAL LOW (ref 13.0–17.0)
Immature Granulocytes: 1 %
Lymphocytes Relative: 17 %
Lymphs Abs: 1.3 10*3/uL (ref 0.7–4.0)
MCH: 29.1 pg (ref 26.0–34.0)
MCHC: 32.1 g/dL (ref 30.0–36.0)
MCV: 90.9 fL (ref 80.0–100.0)
Monocytes Absolute: 0.6 10*3/uL (ref 0.1–1.0)
Monocytes Relative: 8 %
Neutro Abs: 5.6 10*3/uL (ref 1.7–7.7)
Neutrophils Relative %: 72 %
Platelet Count: 144 10*3/uL — ABNORMAL LOW (ref 150–400)
RBC: 3.74 MIL/uL — ABNORMAL LOW (ref 4.22–5.81)
RDW: 17.6 % — ABNORMAL HIGH (ref 11.5–15.5)
WBC Count: 7.7 10*3/uL (ref 4.0–10.5)
nRBC: 0.3 % — ABNORMAL HIGH (ref 0.0–0.2)

## 2023-04-27 LAB — FERRITIN: Ferritin: 68 ng/mL (ref 24–336)

## 2023-04-27 LAB — CMP (CANCER CENTER ONLY)
ALT: 25 U/L (ref 0–44)
AST: 15 U/L (ref 15–41)
Albumin: 3.8 g/dL (ref 3.5–5.0)
Alkaline Phosphatase: 106 U/L (ref 38–126)
Anion gap: 7 (ref 5–15)
BUN: 60 mg/dL — ABNORMAL HIGH (ref 8–23)
CO2: 25 mmol/L (ref 22–32)
Calcium: 9 mg/dL (ref 8.9–10.3)
Chloride: 107 mmol/L (ref 98–111)
Creatinine: 2.26 mg/dL — ABNORMAL HIGH (ref 0.61–1.24)
GFR, Estimated: 28 mL/min — ABNORMAL LOW (ref >60)
Glucose, Bld: 258 mg/dL — ABNORMAL HIGH (ref 70–99)
Potassium: 4.8 mmol/L (ref 3.5–5.1)
Sodium: 139 mmol/L (ref 135–145)
Total Bilirubin: 1.7 mg/dL — ABNORMAL HIGH (ref 0.3–1.2)
Total Protein: 6.5 g/dL (ref 6.5–8.1)

## 2023-04-27 LAB — IRON AND IRON BINDING CAPACITY (CC-WL,HP ONLY)
Iron: 56 ug/dL (ref 45–182)
Saturation Ratios: 19 % (ref 17.9–39.5)
TIBC: 302 ug/dL (ref 250–450)
UIBC: 246 ug/dL (ref 117–376)

## 2023-04-27 LAB — LACTATE DEHYDROGENASE: LDH: 205 U/L — ABNORMAL HIGH (ref 98–192)

## 2023-04-27 MED ORDER — LUSPATERCEPT-AAMT 75 MG ~~LOC~~ SOLR
100.0000 mg | Freq: Once | SUBCUTANEOUS | Status: AC
  Administered 2023-04-27: 100 mg via SUBCUTANEOUS
  Filled 2023-04-27: qty 1.5

## 2023-04-27 MED ORDER — DARBEPOETIN ALFA 150 MCG/0.3ML IJ SOSY
150.0000 ug | PREFILLED_SYRINGE | Freq: Once | INTRAMUSCULAR | Status: AC
  Administered 2023-04-27: 150 ug via SUBCUTANEOUS

## 2023-04-27 NOTE — Patient Instructions (Signed)
Luspatercept Injection What is this medication? LUSPATERCEPT (lus PAT er sept) treats low levels of red blood cells (anemia) in the body in people with beta thalassemia or myelodysplastic syndromes. It works by helping the body make more red blood cells. This medicine may be used for other purposes; ask your health care provider or pharmacist if you have questions. COMMON BRAND NAME(S): REBLOZYL What should I tell my care team before I take this medication? They need to know if you have any of these conditions: Have had your spleen removed High blood pressure History of blood clots Tobacco use An unusual or allergic reaction to luspatercept, other medications, foods, dyes, or preservatives Pregnant or trying to get pregnant Breastfeeding How should I use this medication? This medication is injected under the skin. It is given by your care team in a hospital or clinic setting. Talk to your care team about the use of the medication in children. It is not approved for use in children. Overdosage: If you think you have taken too much of this medicine contact a poison control center or emergency room at once. NOTE: This medicine is only for you. Do not share this medicine with others. What if I miss a dose? Keep appointments for follow-up doses. It is important not to miss your dose. Call your care team if you are unable to keep an appointment. What may interact with this medication? Interactions are not expected. This list may not describe all possible interactions. Give your health care provider a list of all the medicines, herbs, non-prescription drugs, or dietary supplements you use. Also tell them if you smoke, drink alcohol, or use illegal drugs. Some items may interact with your medicine. What should I watch for while using this medication? Your condition will be monitored carefully while you are receiving this medication. You may need blood work done while you are taking this  medication. Talk to your care team if you may be pregnant. Serious birth defects can occur if you take this medication during pregnancy. Contraception is recommended while taking this medication. Your care team can help you find the option that works for you. Talk to your care team before breastfeeding. Changes to your treatment plan may be needed. What side effects may I notice from receiving this medication? Side effects that you should report to your care team as soon as possible: Allergic reactions--skin rash, itching, hives, swelling of the face, lips, tongue, or throat Blood clot--pain, swelling, or warmth in the leg, shortness of breath, chest pain Increase in blood pressure Severe back pain, numbness or weakness of the hands, arms, legs, or feet, loss of coordination, loss of bowel or bladder control Side effects that usually do not require medical attention (report these to your care team if they continue or are bothersome): Bone pain Dizziness Fatigue Headache Joint pain Muscle pain Stomach pain This list may not describe all possible side effects. Call your doctor for medical advice about side effects. You may report side effects to FDA at 1-800-FDA-1088. Where should I keep my medication? This medication is given in a hospital or clinic. It will not be stored at home. NOTE: This sheet is a summary. It may not cover all possible information. If you have questions about this medicine, talk to your doctor, pharmacist, or health care provider.  2024 Elsevier/Gold Standard (2023-02-05 00:00:00) Darbepoetin Alfa Injection What is this medication? DARBEPOETIN ALFA (dar be POE e tin AL fa) treats low levels of red blood cells (anemia) caused by  kidney disease or chemotherapy. It works by Systems analyst make more red blood cells, which reduces the need for blood transfusions. This medicine may be used for other purposes; ask your health care provider or pharmacist if you have  questions. COMMON BRAND NAME(S): Aranesp What should I tell my care team before I take this medication? They need to know if you have any of these conditions: Blood clots Cancer Heart disease High blood pressure On dialysis Seizures Stroke An unusual or allergic reaction to darbepoetin, latex, other medications, foods, dyes, or preservatives Pregnant or trying to get pregnant Breast-feeding How should I use this medication? This medication is injected into a vein or under the skin. It is usually given by a care team in a hospital or clinic setting. It may also be given at home. If you get this medication at home, you will be taught how to prepare and give it. Use exactly as directed. Take it as directed on the prescription label at the same time every day. Keep taking it unless your care team tells you to stop. It is important that you put your used needles and syringes in a special sharps container. Do not put them in a trash can. If you do not have a sharps container, call your pharmacist or care team to get one. A special MedGuide will be given to you by the pharmacist with each prescription and refill. Be sure to read this information carefully each time. Talk to your care team about the use of this medication in children. While this medication may be used in children as young as 1 month of age for selected conditions, precautions do apply. Overdosage: If you think you have taken too much of this medicine contact a poison control center or emergency room at once. NOTE: This medicine is only for you. Do not share this medicine with others. What if I miss a dose? If you miss a dose, take it as soon as you can. If it is almost time for your next dose, take only that dose. Do not take double or extra doses. What may interact with this medication? Epoetin alfa Methoxy polyethylene glycol-epoetin beta This list may not describe all possible interactions. Give your health care provider a list  of all the medicines, herbs, non-prescription drugs, or dietary supplements you use. Also tell them if you smoke, drink alcohol, or use illegal drugs. Some items may interact with your medicine. What should I watch for while using this medication? Visit your care team for regular checks on your progress. Check your blood pressure as directed. Know what your blood pressure should be and when to contact your care team. Your condition will be monitored carefully while you are receiving this medication. You may need blood work while taking this medication. What side effects may I notice from receiving this medication? Side effects that you should report to your care team as soon as possible: Allergic reactions--skin rash, itching, hives, swelling of the face, lips, tongue, or throat Blood clot--pain, swelling, or warmth in the leg, shortness of breath, chest pain Heart attack--pain or tightness in the chest, shoulders, arms, or jaw, nausea, shortness of breath, cold or clammy skin, feeling faint or lightheaded Increase in blood pressure Rash, fever, and swollen lymph nodes Redness, blistering, peeling, or loosening of the skin, including inside the mouth Seizures Stroke--sudden numbness or weakness of the face, arm, or leg, trouble speaking, confusion, trouble walking, loss of balance or coordination, dizziness, severe headache, change in  vision Side effects that usually do not require medical attention (report to your care team if they continue or are bothersome): Cough Stomach pain Swelling of the ankles, hands, or feet This list may not describe all possible side effects. Call your doctor for medical advice about side effects. You may report side effects to FDA at 1-800-FDA-1088. Where should I keep my medication? Keep out of the reach of children and pets. Store in a refrigerator. Do not freeze. Do not shake. Protect from light. Keep this medication in the original container until you are ready  to take it. See product for storage information. Get rid of any unused medication after the expiration date. To get rid of medications that are no longer needed or have expired: Take the medication to a medication take-back program. Check with your pharmacy or law enforcement to find a location. If you cannot return the medication, ask your pharmacist or care team how to get rid of the medication safely. NOTE: This sheet is a summary. It may not cover all possible information. If you have questions about this medicine, talk to your doctor, pharmacist, or health care provider.  2024 Elsevier/Gold Standard (2022-01-07 00:00:00)

## 2023-04-27 NOTE — Progress Notes (Signed)
Hematology and Oncology Follow Up Visit  Eric [name] [id number] [birth year] 82 y.o. 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  We last saw him back in June.   His wife comes in with him. He has been fair to ok since we last saw him. He had a fall onto concrete about a month ago. Luckily he did not have any fractures from this event.   He has had iron studies back in June.  At that time, his ferritin was 61 with an iron saturation of 20%.  He has had no cough.  He does have some shortness of breath on occasion.  He does have some episodes of congestive heart failure.  Currently, I would say his performance status is probably ECOG 2-3.   Wt Readings from Last 3 Encounters:  04/27/23 240 lb 1.9 oz (108.9 kg)  03/19/23 249 lb 0.6 oz (113 kg)  02/05/23 257 lb 1.9 oz (116.6 kg)     Medications:  Allergies as of 04/27/2023       Reactions   Iohexol Other (See Comments)    Desc: "shuts kidneys down"   Morphine And Codeine Shortness Of Breath   Hydroxyzine Nausea Only   Fluconazole Rash   And welts   Hydrocodone Nausea And Vomiting   Metoprolol Rash   Sulfa Antibiotics Other (See Comments)   UNKNOWN REACTION   Sulfonamide Derivatives Other (See Comments)   UNKNOWN REACTION   Tramadol Rash   Trazodone Rash, Other (See Comments)   Pain   Trazodone And Nefazodone Rash   Pain   Valtrex [valacyclovir] Itching, Rash        Medication List        Accurate as of April 27, 2023  1:22 PM. If you have any questions, ask your nurse or doctor.          acetaminophen 325 MG tablet Commonly known as: TYLENOL Take 650 mg by mouth every 6 (six) hours as needed for moderate pain.    allopurinol 100 MG tablet Commonly known as: ZYLOPRIM Take 100 mg by mouth daily.   apixaban 2.5 MG Tabs tablet Commonly known as: ELIQUIS Take 1 tablet (2.5 mg total) by mouth 2 (two) times daily.   Cholecalciferol 125 MCG (5000 UT) Tabs Take 5,000 Units by mouth daily.   cyanocobalamin 1000 MCG tablet Commonly known as: VITAMIN B12 Take 1 tablet by mouth daily.   ferrous sulfate 325 (65 FE) MG tablet Take 1 tablet by mouth daily.   insulin lispro 100 UNIT/ML KwikPen Commonly known as: HUMALOG Inject 20 Units into the skin 3 (three) times daily.   levothyroxine 100 MCG tablet Commonly known as: SYNTHROID Take 50-100 mcg by mouth See admin instructions. Takes 100 mg daily except Sundays, on Sunday takes 50 mg   levothyroxine 100 MCG tablet Commonly known as: SYNTHROID Take 50 mcg by mouth daily before breakfast.   nystatin powder Commonly known as: MYCOSTATIN/NYSTOP Apply 1 Application topically 3 (three) times daily.   OneTouch Delica Lancets 30G Misc 4 (four) times daily.   silodosin 8 MG Caps capsule Commonly known as: RAPAFLO Take 8 mg by mouth daily.   simvastatin 20 MG  tablet Commonly known as: ZOCOR Take 1 tablet (20 mg total) by mouth at bedtime.   spironolactone 50 MG tablet Commonly known as: ALDACTONE Take 1 tablet (50 mg total) by mouth daily.   torsemide 20 MG tablet Commonly known as: DEMADEX Take 1 tablet (20 mg total) by mouth daily. Take 2nd pill on Monday, Wednesday and Friday   Toujeo SoloStar 300 UNIT/ML Solostar Pen Generic drug: insulin glargine (1 Unit Dial) Inject 46 Units into the skin daily. Sliding scale        Allergies:  Allergies  Allergen Reactions   Iohexol Other (See Comments)     Desc: "shuts kidneys down"    Morphine And Codeine Shortness Of Breath   Hydroxyzine Nausea Only   Fluconazole Rash    And welts   Hydrocodone Nausea And Vomiting   Metoprolol Rash   Sulfa Antibiotics Other (See Comments)    UNKNOWN  REACTION   Sulfonamide Derivatives Other (See Comments)    UNKNOWN REACTION   Tramadol Rash   Trazodone Rash and Other (See Comments)    Pain   Trazodone And Nefazodone Rash    Pain   Valtrex [Valacyclovir] Itching and Rash    Past Medical History, Surgical history, Social history, and Family History were reviewed and updated.  Review of Systems: All other 10 point review of systems is negative.   Physical Exam:  height is 6' (1.829 m) and weight is 240 lb 1.9 oz (108.9 kg). His oral temperature is 97.6 F (36.4 C). His blood pressure is 138/54 (abnormal) and his pulse is 75. His respiration is 19 and oxygen saturation is 100%.   Wt Readings from Last 3 Encounters:  04/27/23 240 lb 1.9 oz (108.9 kg)  03/19/23 249 lb 0.6 oz (113 kg)  02/05/23 257 lb 1.9 oz (116.6 kg)    Physical Exam Vitals reviewed.  HENT:     Head: Normocephalic and atraumatic.  Eyes:     Pupils: Pupils are equal, round, and reactive to light.  Cardiovascular:     Rate and Rhythm: Normal rate and regular rhythm.     Heart sounds: Normal heart sounds.     Comments: Cardiac exam is regular rate and rhythm.  He does have 2/6 systolic ejection murmur. Pulmonary:     Effort: Pulmonary effort is normal.     Breath sounds: Normal breath sounds.  Abdominal:     General: Bowel sounds are normal.     Palpations: Abdomen is soft.  Musculoskeletal:        General: No tenderness or deformity. Normal range of motion.     Cervical back: Normal range of motion.     Comments: His extremities does show some 1+ edema.  He does have some compression stockings on.  Lymphadenopathy:     Cervical: No cervical adenopathy.  Skin:    General: Skin is warm and dry.     Findings: No erythema or rash.  Neurological:     Mental Status: He is alert and oriented to person, place, and time.  Psychiatric:        Behavior: Behavior normal.        Thought Content: Thought content normal.        Judgment: Judgment normal.       Lab Results  Component Value Date   WBC 7.7 04/27/2023   HGB 10.9 (L) 04/27/2023   HCT 34.0 (L) 04/27/2023   MCV 90.9 04/27/2023   PLT 144 (L) 04/27/2023   Lab Results  Component Value Date   FERRITIN 61 03/19/2023   IRON 73 03/19/2023   TIBC 370 03/19/2023   UIBC 297 03/19/2023   IRONPCTSAT 20 03/19/2023   Lab Results  Component Value Date   RETICCTPCT 4.1 (H) 11/04/2022   RBC 3.74 (L) 04/27/2023   Lab Results  Component Value Date   KPAFRELGTCHN 49.7 (H) 02/17/2020   LAMBDASER 40.1 (H) 02/17/2020   KAPLAMBRATIO 6.90 04/30/2020   Lab Results  Component Value Date   IGGSERUM 1,267 04/29/2020   IGA 231 04/29/2020   IGMSERUM 36 04/29/2020   Lab Results  Component Value Date   TOTALPROTELP 6.5 04/29/2020   ALBUMINELP 2.9 02/17/2020   A1GS 0.4 02/17/2020   A2GS 0.7 02/17/2020   BETS 0.8 02/17/2020   GAMS 0.7 02/17/2020   MSPIKE 0.3 (H) 02/17/2020   SPEI Comment 02/17/2020     Chemistry      Component Value Date/Time   NA 139 04/27/2023 1226   K 4.8 04/27/2023 1226   CL 107 04/27/2023 1226   CO2 25 04/27/2023 1226   BUN 60 (H) 04/27/2023 1226   CREATININE 2.26 (H) 04/27/2023 1226      Component Value Date/Time   CALCIUM 9.0 04/27/2023 1226   ALKPHOS 106 04/27/2023 1226   AST 15 04/27/2023 1226   ALT 25 04/27/2023 1226   BILITOT 1.7 (H) 04/27/2023 1226       Impression and Plan: Mr. Fickle is a very pleasant 82 yo caucasian gentleman with myelodysplasia -- refractory anemia with ringed sideroblast.   Luspatercept and Retacrit today  He will continue on the aspirin.  He is also on low-dose of Eliquis.  RTC 4 weeks MD/APP, labs(CBC, CMP, ferritin, iron, LDH) +- Lucpatercept +- Retacrit     Rushie Chestnut, PA-C 8/6/20241:22 PM

## 2023-04-28 ENCOUNTER — Other Ambulatory Visit: Payer: Self-pay

## 2023-05-26 ENCOUNTER — Inpatient Hospital Stay (HOSPITAL_BASED_OUTPATIENT_CLINIC_OR_DEPARTMENT_OTHER): Payer: Medicare Other | Admitting: Hematology & Oncology

## 2023-05-26 ENCOUNTER — Encounter: Payer: Self-pay | Admitting: Hematology & Oncology

## 2023-05-26 ENCOUNTER — Inpatient Hospital Stay: Payer: Medicare Other

## 2023-05-26 ENCOUNTER — Inpatient Hospital Stay: Payer: Medicare Other | Attending: Hematology & Oncology

## 2023-05-26 DIAGNOSIS — D461 Refractory anemia with ring sideroblasts: Secondary | ICD-10-CM

## 2023-05-26 DIAGNOSIS — D4620 Refractory anemia with excess of blasts, unspecified: Secondary | ICD-10-CM

## 2023-05-26 DIAGNOSIS — Z79899 Other long term (current) drug therapy: Secondary | ICD-10-CM | POA: Insufficient documentation

## 2023-05-26 DIAGNOSIS — D518 Other vitamin B12 deficiency anemias: Secondary | ICD-10-CM

## 2023-05-26 DIAGNOSIS — D509 Iron deficiency anemia, unspecified: Secondary | ICD-10-CM

## 2023-05-26 LAB — CBC WITH DIFFERENTIAL (CANCER CENTER ONLY)
Abs Immature Granulocytes: 0.01 10*3/uL (ref 0.00–0.07)
Basophils Absolute: 0 10*3/uL (ref 0.0–0.1)
Basophils Relative: 1 %
Eosinophils Absolute: 0.1 10*3/uL (ref 0.0–0.5)
Eosinophils Relative: 2 %
HCT: 32.2 % — ABNORMAL LOW (ref 39.0–52.0)
Hemoglobin: 10 g/dL — ABNORMAL LOW (ref 13.0–17.0)
Immature Granulocytes: 0 %
Lymphocytes Relative: 24 %
Lymphs Abs: 1 10*3/uL (ref 0.7–4.0)
MCH: 28.1 pg (ref 26.0–34.0)
MCHC: 31.1 g/dL (ref 30.0–36.0)
MCV: 90.4 fL (ref 80.0–100.0)
Monocytes Absolute: 0.5 10*3/uL (ref 0.1–1.0)
Monocytes Relative: 11 %
Neutro Abs: 2.6 10*3/uL (ref 1.7–7.7)
Neutrophils Relative %: 62 %
Platelet Count: 100 10*3/uL — ABNORMAL LOW (ref 150–400)
RBC: 3.56 MIL/uL — ABNORMAL LOW (ref 4.22–5.81)
RDW: 18.6 % — ABNORMAL HIGH (ref 11.5–15.5)
WBC Count: 4.2 10*3/uL (ref 4.0–10.5)
nRBC: 0 % (ref 0.0–0.2)

## 2023-05-26 LAB — CMP (CANCER CENTER ONLY)
ALT: 10 U/L (ref 0–44)
AST: 11 U/L — ABNORMAL LOW (ref 15–41)
Albumin: 3.6 g/dL (ref 3.5–5.0)
Alkaline Phosphatase: 122 U/L (ref 38–126)
Anion gap: 9 (ref 5–15)
BUN: 67 mg/dL — ABNORMAL HIGH (ref 8–23)
CO2: 23 mmol/L (ref 22–32)
Calcium: 8.7 mg/dL — ABNORMAL LOW (ref 8.9–10.3)
Chloride: 106 mmol/L (ref 98–111)
Creatinine: 2.35 mg/dL — ABNORMAL HIGH (ref 0.61–1.24)
GFR, Estimated: 27 mL/min — ABNORMAL LOW (ref >60)
Glucose, Bld: 365 mg/dL — ABNORMAL HIGH (ref 70–99)
Potassium: 4.9 mmol/L (ref 3.5–5.1)
Sodium: 138 mmol/L (ref 135–145)
Total Bilirubin: 1.6 mg/dL — ABNORMAL HIGH (ref 0.3–1.2)
Total Protein: 6.4 g/dL — ABNORMAL LOW (ref 6.5–8.1)

## 2023-05-26 MED ORDER — LUSPATERCEPT-AAMT 75 MG ~~LOC~~ SOLR
100.0000 mg | Freq: Once | SUBCUTANEOUS | Status: AC
  Administered 2023-05-26: 100 mg via SUBCUTANEOUS
  Filled 2023-05-26: qty 1.5

## 2023-05-26 MED ORDER — DARBEPOETIN ALFA 150 MCG/0.3ML IJ SOSY
150.0000 ug | PREFILLED_SYRINGE | Freq: Once | INTRAMUSCULAR | Status: AC
  Administered 2023-05-26: 150 ug via SUBCUTANEOUS

## 2023-05-26 NOTE — Patient Instructions (Signed)

## 2023-05-26 NOTE — Progress Notes (Signed)
Hematology and Oncology Follow Up Visit  Saint Welp [id number] 08/22/1941 82 y.o. 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 Marquez is here today for follow-up and injection.  He has been in rehab.  He apparently is having problems with his right leg.  His right lower leg is quite swollen.  Not sure as to why it would be swollen.  He has not had an evaluation over at [facility].  There is a very thorough evaluation and did not find anything.  He is on low-dose Eliquis.  This really should be helpful with respect to thromboembolic disease.  He is supposed to see a vascular doctor but apparently cannot see him until February of next year.  I would think this is unacceptable.  We will try to get him in sooner if possible.  Hopefully, he will be going home from rehab on Friday.  He really cannot put weight on the right leg.  Again, he has had an evaluation which has been unremarkable to date.  He has had Dopplers.  This was negative.  He has had no cough.  He has had no change in bowel or bladder habits.  He has had no nausea or vomiting.  He seems to be eating okay.  His blood sugar is horrible.  It is 365.  He has renal insufficiency with a BUN of 67 creatinine 2.35.   His last iron studies that were done back in August showed a ferritin of 68 with an iron saturation of 19%.  Currently, I would say that his performance status is probably ECOG 2.   Medications:  Allergies as of 05/26/2023       Reactions   Iohexol Other (See Comments)    Desc: "shuts kidneys down"   Morphine And Codeine Shortness Of Breath   Hydroxyzine Nausea Only   Fluconazole Rash   And welts   Hydrocodone Nausea And Vomiting   Metoprolol Rash   Sulfa  Antibiotics Other (See Comments)   UNKNOWN REACTION   Sulfonamide Derivatives Other (See Comments)   UNKNOWN REACTION   Tramadol Rash   Trazodone Rash, Other (See Comments)   Pain   Trazodone And Nefazodone Rash   Pain   Valtrex [valacyclovir] Itching, Rash        Medication List        Accurate as of May 26, 2023  3:16 PM. If you have any questions, ask your nurse or doctor.          STOP taking these medications    cyanocobalamin 1000 MCG tablet Commonly known as: VITAMIN B12 Stopped by: Josph Macho   ferrous sulfate 325 (65 FE) MG tablet Stopped by: Josph Macho       TAKE these medications    acetaminophen 325 MG tablet Commonly known as: TYLENOL Take 650 mg by mouth every 6 (six) hours as needed for moderate pain.   allopurinol 100 MG tablet Commonly known as: ZYLOPRIM Take 100 mg by mouth daily.   apixaban 2.5 MG Tabs tablet Commonly known as: ELIQUIS Take 1 tablet (2.5 mg total) by mouth 2 (two) times daily.   Cholecalciferol 125 MCG (5000 UT) Tabs Take 5,000 Units by mouth daily.  cyclobenzaprine 5 MG tablet Commonly known as: FLEXERIL Take 5 mg by mouth 3 (three) times daily as needed.   Darbepoetin Alfa 150 MCG/0.3ML Sosy injection Commonly known as: ARANESP Inject into the skin.   insulin lispro 100 UNIT/ML KwikPen Commonly known as: HUMALOG Inject 20 Units into the skin 3 (three) times daily.   levothyroxine 100 MCG tablet Commonly known as: SYNTHROID Take 50-100 mcg by mouth See admin instructions. Takes 100 mg daily except Sundays, on Sunday takes 50 mg   levothyroxine 100 MCG tablet Commonly known as: SYNTHROID Take 50 mcg by mouth daily before breakfast.   nystatin powder Commonly known as: MYCOSTATIN/NYSTOP Apply 1 Application topically 3 (three) times daily.   OneTouch Delica Lancets 30G Misc 4 (four) times daily.   pregabalin 25 MG capsule Commonly known as: LYRICA Take by mouth 2 (two) times daily.    Reblozyl 25 MG subcutaneous injection Generic drug: luspatercept-aamt Inject 1 mg/kg into the skin once.   silodosin 4 MG Caps capsule Commonly known as: RAPAFLO Take 4 mg by mouth daily. What changed: Another medication with the same name was removed. Continue taking this medication, and follow the directions you see here. Changed by: Josph Macho   simvastatin 20 MG tablet Commonly known as: ZOCOR Take 1 tablet (20 mg total) by mouth at bedtime.   spironolactone 50 MG tablet Commonly known as: ALDACTONE Take 1 tablet (50 mg total) by mouth daily.   torsemide 20 MG tablet Commonly known as: DEMADEX Take 1 tablet (20 mg total) by mouth daily. Take 2nd pill on Monday, Wednesday and Friday   Toujeo SoloStar 300 UNIT/ML Solostar Pen Generic drug: insulin glargine (1 Unit Dial) Inject 30 Units into the skin daily. Sliding scale        Allergies:  Allergies  Allergen Reactions   Iohexol Other (See Comments)     Desc: "shuts kidneys down"    Morphine And Codeine Shortness Of Breath   Hydroxyzine Nausea Only   Fluconazole Rash    And welts   Hydrocodone Nausea And Vomiting   Metoprolol Rash   Sulfa Antibiotics Other (See Comments)    UNKNOWN REACTION   Sulfonamide Derivatives Other (See Comments)    UNKNOWN REACTION   Tramadol Rash   Trazodone Rash and Other (See Comments)    Pain   Trazodone And Nefazodone Rash    Pain   Valtrex [Valacyclovir] Itching and Rash    Past Medical History, Surgical history, Social history, and Family History were reviewed and updated.  Review of Systems: All other 10 point review of systems is negative.   Physical Exam:  height is 6' (1.829 m) and weight is 230 lb (104.3 kg). His oral temperature is 97.7 F (36.5 C). His blood pressure is 140/66 (abnormal) and his pulse is 77. His respiration is 20 and oxygen saturation is 100%.   Wt Readings from Last 3 Encounters:  05/26/23 230 lb (104.3 kg)  04/27/23 240 lb 1.9 oz (108.9  kg)  03/19/23 249 lb 0.6 oz (113 kg)    Physical Exam Vitals reviewed.  HENT:     Head: Normocephalic and atraumatic.  Eyes:     Pupils: Pupils are equal, round, and reactive to light.  Cardiovascular:     Rate and Rhythm: Normal rate and regular rhythm.     Heart sounds: Normal heart sounds.     Comments: Cardiac exam is regular rate and rhythm.  He does have 2/6 systolic ejection murmur. Pulmonary:  Effort: Pulmonary effort is normal.     Breath sounds: Normal breath sounds.  Abdominal:     General: Bowel sounds are normal.     Palpations: Abdomen is soft.  Musculoskeletal:        General: No tenderness or deformity. Normal range of motion.     Cervical back: Normal range of motion.     Comments: His extremities does show edema in the left leg.  This is some pitting edema.  It is a little bit tender.  There is no redness.    Lymphadenopathy:     Cervical: No cervical adenopathy.  Skin:    General: Skin is warm and dry.     Findings: No erythema or rash.  Neurological:     Mental Status: He is alert and oriented to person, place, and time.  Psychiatric:        Behavior: Behavior normal.        Thought Content: Thought content normal.        Judgment: Judgment normal.      Lab Results  Component Value Date   WBC 4.2 05/26/2023   HGB 10.0 (L) 05/26/2023   HCT 32.2 (L) 05/26/2023   MCV 90.4 05/26/2023   PLT 100 (L) 05/26/2023   Lab Results  Component Value Date   FERRITIN 68 04/27/2023   IRON 56 04/27/2023   TIBC 302 04/27/2023   UIBC 246 04/27/2023   IRONPCTSAT 19 04/27/2023   Lab Results  Component Value Date   RETICCTPCT 4.1 (H) 11/04/2022   RBC 3.56 (L) 05/26/2023   Lab Results  Component Value Date   KPAFRELGTCHN 49.7 (H) 02/17/2020   LAMBDASER 40.1 (H) 02/17/2020   KAPLAMBRATIO 6.90 04/30/2020   Lab Results  Component Value Date   IGGSERUM 1,267 04/29/2020   IGA 231 04/29/2020   IGMSERUM 36 04/29/2020   Lab Results  Component Value Date    TOTALPROTELP 6.5 04/29/2020   ALBUMINELP 2.9 02/17/2020   A1GS 0.4 02/17/2020   A2GS 0.7 02/17/2020   BETS 0.8 02/17/2020   GAMS 0.7 02/17/2020   MSPIKE 0.3 (H) 02/17/2020   SPEI Comment 02/17/2020     Chemistry      Component Value Date/Time   NA 138 05/26/2023 1423   K 4.9 05/26/2023 1423   CL 106 05/26/2023 1423   CO2 23 05/26/2023 1423   BUN 67 (H) 05/26/2023 1423   CREATININE 2.35 (H) 05/26/2023 1423      Component Value Date/Time   CALCIUM 8.7 (L) 05/26/2023 1423   ALKPHOS 122 05/26/2023 1423   AST 11 (L) 05/26/2023 1423   ALT 10 05/26/2023 1423   BILITOT 1.6 (H) 05/26/2023 1423       Impression and Plan: Mr. Levatino is a very pleasant 82 yo caucasian gentleman with myelodysplasia -- refractory anemia with ringed sideroblast.   I am still not sure as to why has all the swelling in the left lower leg.  Again has been evaluated for this.  We will have to see if we can get him into see the vascular surgeon.  He will get his Luspatercept.  He will get his Retacrit.  I will like to see him back in about 3 or 4 weeks.   Josph Macho, MD 9/4/20243:16 PM

## 2023-05-27 ENCOUNTER — Other Ambulatory Visit: Payer: Self-pay

## 2023-06-04 ENCOUNTER — Other Ambulatory Visit: Payer: Self-pay

## 2023-06-14 ENCOUNTER — Other Ambulatory Visit: Payer: Self-pay

## 2023-06-25 ENCOUNTER — Inpatient Hospital Stay: Payer: Medicare Other | Attending: Hematology & Oncology

## 2023-06-25 ENCOUNTER — Inpatient Hospital Stay (HOSPITAL_BASED_OUTPATIENT_CLINIC_OR_DEPARTMENT_OTHER): Payer: Medicare Other | Admitting: Medical Oncology

## 2023-06-25 ENCOUNTER — Inpatient Hospital Stay: Payer: Medicare Other

## 2023-06-25 VITALS — BP 136/72 | HR 72 | Temp 97.8°F | Resp 16 | Ht 72.0 in | Wt 242.0 lb

## 2023-06-25 DIAGNOSIS — D4620 Refractory anemia with excess of blasts, unspecified: Secondary | ICD-10-CM | POA: Diagnosis not present

## 2023-06-25 DIAGNOSIS — D518 Other vitamin B12 deficiency anemias: Secondary | ICD-10-CM

## 2023-06-25 DIAGNOSIS — D461 Refractory anemia with ring sideroblasts: Secondary | ICD-10-CM | POA: Diagnosis present

## 2023-06-25 DIAGNOSIS — Z79899 Other long term (current) drug therapy: Secondary | ICD-10-CM | POA: Insufficient documentation

## 2023-06-25 LAB — FERRITIN: Ferritin: 57 ng/mL (ref 24–336)

## 2023-06-25 LAB — CBC WITH DIFFERENTIAL (CANCER CENTER ONLY)
Abs Immature Granulocytes: 0.02 10*3/uL (ref 0.00–0.07)
Basophils Absolute: 0.1 10*3/uL (ref 0.0–0.1)
Basophils Relative: 1 %
Eosinophils Absolute: 0.2 10*3/uL (ref 0.0–0.5)
Eosinophils Relative: 3 %
HCT: 33.4 % — ABNORMAL LOW (ref 39.0–52.0)
Hemoglobin: 10.3 g/dL — ABNORMAL LOW (ref 13.0–17.0)
Immature Granulocytes: 0 %
Lymphocytes Relative: 23 %
Lymphs Abs: 1.2 10*3/uL (ref 0.7–4.0)
MCH: 27 pg (ref 26.0–34.0)
MCHC: 30.8 g/dL (ref 30.0–36.0)
MCV: 87.7 fL (ref 80.0–100.0)
Monocytes Absolute: 0.5 10*3/uL (ref 0.1–1.0)
Monocytes Relative: 10 %
Neutro Abs: 3.1 10*3/uL (ref 1.7–7.7)
Neutrophils Relative %: 63 %
Platelet Count: 101 10*3/uL — ABNORMAL LOW (ref 150–400)
RBC: 3.81 MIL/uL — ABNORMAL LOW (ref 4.22–5.81)
RDW: 17.5 % — ABNORMAL HIGH (ref 11.5–15.5)
WBC Count: 5 10*3/uL (ref 4.0–10.5)
nRBC: 0 % (ref 0.0–0.2)

## 2023-06-25 LAB — IRON AND IRON BINDING CAPACITY (CC-WL,HP ONLY)
Iron: 56 ug/dL (ref 45–182)
Saturation Ratios: 16 % — ABNORMAL LOW (ref 17.9–39.5)
TIBC: 361 ug/dL (ref 250–450)
UIBC: 305 ug/dL (ref 117–376)

## 2023-06-25 LAB — CMP (CANCER CENTER ONLY)
ALT: 7 U/L (ref 0–44)
AST: 13 U/L — ABNORMAL LOW (ref 15–41)
Albumin: 3.8 g/dL (ref 3.5–5.0)
Alkaline Phosphatase: 118 U/L (ref 38–126)
Anion gap: 9 (ref 5–15)
BUN: 59 mg/dL — ABNORMAL HIGH (ref 8–23)
CO2: 23 mmol/L (ref 22–32)
Calcium: 9.3 mg/dL (ref 8.9–10.3)
Chloride: 105 mmol/L (ref 98–111)
Creatinine: 2.26 mg/dL — ABNORMAL HIGH (ref 0.61–1.24)
GFR, Estimated: 28 mL/min — ABNORMAL LOW (ref >60)
Glucose, Bld: 138 mg/dL — ABNORMAL HIGH (ref 70–99)
Potassium: 4.5 mmol/L (ref 3.5–5.1)
Sodium: 137 mmol/L (ref 135–145)
Total Bilirubin: 2 mg/dL — ABNORMAL HIGH (ref 0.3–1.2)
Total Protein: 6.7 g/dL (ref 6.5–8.1)

## 2023-06-25 LAB — RETICULOCYTES
Immature Retic Fract: 22.9 % — ABNORMAL HIGH (ref 2.3–15.9)
RBC.: 3.69 MIL/uL — ABNORMAL LOW (ref 4.22–5.81)
Retic Count, Absolute: 147.6 10*3/uL (ref 19.0–186.0)
Retic Ct Pct: 4 % — ABNORMAL HIGH (ref 0.4–3.1)

## 2023-06-25 MED ORDER — DARBEPOETIN ALFA 150 MCG/0.3ML IJ SOSY
150.0000 ug | PREFILLED_SYRINGE | Freq: Once | INTRAMUSCULAR | Status: AC
  Administered 2023-06-25: 150 ug via SUBCUTANEOUS

## 2023-06-25 MED ORDER — LUSPATERCEPT-AAMT 75 MG ~~LOC~~ SOLR
100.0000 mg | Freq: Once | SUBCUTANEOUS | Status: AC
  Administered 2023-06-25: 100 mg via SUBCUTANEOUS
  Filled 2023-06-25: qty 1.5

## 2023-06-25 NOTE — Progress Notes (Signed)
Hematology and Oncology Follow Up Visit  Eric [name] [id number] 1941-07-13 82 y.o. 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 He is here with his wife.   Since his last visit they have been doing well.   He is on low-dose Eliquis. No bleeding events.   He has chronic pain of the right leg. It has been better recently-he uses his cane.   He has had no cough.  He has had no change in bowel or bladder habits.  He has had no nausea or vomiting.  He seems to be eating okay.  His blood sugar is much better today- 138 which is much improved from his last reading of 365. He has renal insufficiency with a BUN of 59 creatinine 2.26.  His last iron studies that were done back in August showed a ferritin of 68 with an iron saturation of 19%.  Currently, I would say that his performance status is probably ECOG 2. Wt Readings from Last 3 Encounters:  06/25/23 242 lb (109.8 kg)  05/26/23 230 lb (104.3 kg)  04/27/23 240 lb 1.9 oz (108.9 kg)     Medications:  Allergies as of 06/25/2023       Reactions   Iohexol Other (See Comments)    Desc: "shuts kidneys down"   Morphine And Codeine Shortness Of Breath   Hydroxyzine Nausea Only   Fluconazole Rash   And welts   Hydrocodone Nausea And Vomiting   Metoprolol Rash   Sulfa Antibiotics Other (See Comments)   UNKNOWN REACTION   Sulfonamide Derivatives Other (See Comments)   UNKNOWN REACTION   Tramadol Rash   Trazodone Rash, Other (See Comments)   Pain   Trazodone And Nefazodone Rash   Pain   Valtrex [valacyclovir] Itching, Rash        Medication List        Accurate as of June 25, 2023 12:26 PM. If you have any questions, ask your nurse or  doctor.          STOP taking these medications    cyclobenzaprine 5 MG tablet Commonly known as: FLEXERIL Stopped by: Rushie Chestnut   nystatin powder Commonly known as: MYCOSTATIN/NYSTOP Stopped by: Rushie Chestnut   pregabalin 25 MG capsule Commonly known as: LYRICA Stopped by: Rushie Chestnut       TAKE these medications    acetaminophen 325 MG tablet Commonly known as: TYLENOL Take 650 mg by mouth every 6 (six) hours as needed for moderate pain.   allopurinol 100 MG tablet Commonly known as: ZYLOPRIM Take 100 mg by mouth daily.   apixaban 2.5 MG Tabs tablet Commonly known as: ELIQUIS Take 1 tablet (2.5 mg total) by mouth 2 (two) times daily.   Cholecalciferol 125 MCG (5000 UT) Tabs Take 5,000 Units by mouth daily.   Darbepoetin Alfa 150 MCG/0.3ML Sosy injection Commonly known as: ARANESP Inject into the skin.   insulin lispro 100 UNIT/ML KwikPen Commonly known as: HUMALOG Inject 20 Units into the skin 3 (three) times daily.   levothyroxine 100 MCG tablet Commonly known as: SYNTHROID Take 50-100 mcg by mouth See admin instructions. Takes 100 mg daily except Sundays, on  Sunday takes 50 mg   levothyroxine 100 MCG tablet Commonly known as: SYNTHROID Take 50 mcg by mouth daily before breakfast.   OneTouch Delica Lancets 30G Misc 4 (four) times daily.   Reblozyl 25 MG subcutaneous injection Generic drug: luspatercept-aamt Inject 1 mg/kg into the skin once.   silodosin 4 MG Caps capsule Commonly known as: RAPAFLO Take 4 mg by mouth daily.   simvastatin 20 MG tablet Commonly known as: ZOCOR Take 1 tablet (20 mg total) by mouth at bedtime.   spironolactone 50 MG tablet Commonly known as: ALDACTONE Take 1 tablet (50 mg total) by mouth daily.   torsemide 20 MG tablet Commonly known as: DEMADEX Take 1 tablet (20 mg total) by mouth daily. Take 2nd pill on Monday, Wednesday and Friday   Toujeo SoloStar 300 UNIT/ML Solostar Pen Generic drug:  insulin glargine (1 Unit Dial) Inject 30 Units into the skin daily. Sliding scale        Allergies:  Allergies  Allergen Reactions   Iohexol Other (See Comments)     Desc: "shuts kidneys down"    Morphine And Codeine Shortness Of Breath   Hydroxyzine Nausea Only   Fluconazole Rash    And welts   Hydrocodone Nausea And Vomiting   Metoprolol Rash   Sulfa Antibiotics Other (See Comments)    UNKNOWN REACTION   Sulfonamide Derivatives Other (See Comments)    UNKNOWN REACTION   Tramadol Rash   Trazodone Rash and Other (See Comments)    Pain   Trazodone And Nefazodone Rash    Pain   Valtrex [Valacyclovir] Itching and Rash    Past Medical History, Surgical history, Social history, and Family History were reviewed and updated.  Review of Systems: All other 10 point review of systems is negative.   Physical Exam:  height is 6' (1.829 m) and weight is 242 lb (109.8 kg). His oral temperature is 97.8 F (36.6 C). His blood pressure is 136/72 and his pulse is 72. His respiration is 16 and oxygen saturation is 100%.   Wt Readings from Last 3 Encounters:  06/25/23 242 lb (109.8 kg)  05/26/23 230 lb (104.3 kg)  04/27/23 240 lb 1.9 oz (108.9 kg)    Physical Exam Vitals reviewed.  HENT:     Head: Normocephalic and atraumatic.  Eyes:     Pupils: Pupils are equal, round, and reactive to light.  Cardiovascular:     Rate and Rhythm: Normal rate and regular rhythm.     Heart sounds: Normal heart sounds.     Comments: Cardiac exam is regular rate and rhythm.  He does have 2/6 systolic ejection murmur. Pulmonary:     Effort: Pulmonary effort is normal.     Breath sounds: Normal breath sounds.  Abdominal:     General: Bowel sounds are normal.     Palpations: Abdomen is soft.  Musculoskeletal:        General: No tenderness or deformity. Normal range of motion.     Cervical back: Normal range of motion.  Lymphadenopathy:     Cervical: No cervical adenopathy.  Skin:    General:  Skin is warm and dry.     Findings: No erythema or rash.  Neurological:     Mental Status: He is alert and oriented to person, place, and time.  Psychiatric:        Behavior: Behavior normal.        Thought Content: Thought content normal.        Judgment: Judgment  normal.      Lab Results  Component Value Date   WBC 5.0 06/25/2023   HGB 10.3 (L) 06/25/2023   HCT 33.4 (L) 06/25/2023   MCV 87.7 06/25/2023   PLT 101 (L) 06/25/2023   Lab Results  Component Value Date   FERRITIN 68 04/27/2023   IRON 56 04/27/2023   TIBC 302 04/27/2023   UIBC 246 04/27/2023   IRONPCTSAT 19 04/27/2023   Lab Results  Component Value Date   RETICCTPCT 4.0 (H) 06/25/2023   RBC 3.81 (L) 06/25/2023   Lab Results  Component Value Date   KPAFRELGTCHN 49.7 (H) 02/17/2020   LAMBDASER 40.1 (H) 02/17/2020   KAPLAMBRATIO 6.90 04/30/2020   Lab Results  Component Value Date   IGGSERUM 1,267 04/29/2020   IGA 231 04/29/2020   IGMSERUM 36 04/29/2020   Lab Results  Component Value Date   TOTALPROTELP 6.5 04/29/2020   ALBUMINELP 2.9 02/17/2020   A1GS 0.4 02/17/2020   A2GS 0.7 02/17/2020   BETS 0.8 02/17/2020   GAMS 0.7 02/17/2020   MSPIKE 0.3 (H) 02/17/2020   SPEI Comment 02/17/2020     Chemistry      Component Value Date/Time   NA 137 06/25/2023 1144   K 4.5 06/25/2023 1144   CL 105 06/25/2023 1144   CO2 23 06/25/2023 1144   BUN 59 (H) 06/25/2023 1144   CREATININE 2.26 (H) 06/25/2023 1144      Component Value Date/Time   CALCIUM 9.3 06/25/2023 1144   ALKPHOS 118 06/25/2023 1144   AST 13 (L) 06/25/2023 1144   ALT 7 06/25/2023 1144   BILITOT 2.0 (H) 06/25/2023 1144       Impression and Plan: Mr. Slaski is a very pleasant 82 yo caucasian gentleman with myelodysplasia -- refractory anemia with ringed sideroblast.   I am glad to hear that he is doing well. Labs reviewed and stable to slightly improved.   He will get his Luspatercept and Retacrit.  I will like to see him back in  about 3 or 4 weeks  RTC 4 weeks MD, labs, Luspatercept    Rushie Chestnut, PA-C 10/4/202412:26 PM

## 2023-06-25 NOTE — Patient Instructions (Signed)
Luspatercept Injection What is this medication? LUSPATERCEPT (lus PAT er sept) treats low levels of red blood cells (anemia) in the body in people with beta thalassemia or myelodysplastic syndromes. It works by helping the body make more red blood cells. This medicine may be used for other purposes; ask your health care provider or pharmacist if you have questions. COMMON BRAND NAME(S): REBLOZYL What should I tell my care team before I take this medication? They need to know if you have any of these conditions: Have had your spleen removed High blood pressure History of blood clots Tobacco use An unusual or allergic reaction to luspatercept, other medications, foods, dyes, or preservatives Pregnant or trying to get pregnant Breastfeeding How should I use this medication? This medication is injected under the skin. It is given by your care team in a hospital or clinic setting. Talk to your care team about the use of the medication in children. It is not approved for use in children. Overdosage: If you think you have taken too much of this medicine contact a poison control center or emergency room at once. NOTE: This medicine is only for you. Do not share this medicine with others. What if I miss a dose? Keep appointments for follow-up doses. It is important not to miss your dose. Call your care team if you are unable to keep an appointment. What may interact with this medication? Interactions are not expected. This list may not describe all possible interactions. Give your health care provider a list of all the medicines, herbs, non-prescription drugs, or dietary supplements you use. Also tell them if you smoke, drink alcohol, or use illegal drugs. Some items may interact with your medicine. What should I watch for while using this medication? Your condition will be monitored carefully while you are receiving this medication. You may need blood work done while you are taking this  medication. Talk to your care team if you may be pregnant. Serious birth defects can occur if you take this medication during pregnancy. Contraception is recommended while taking this medication. Your care team can help you find the option that works for you. Talk to your care team before breastfeeding. Changes to your treatment plan may be needed. What side effects may I notice from receiving this medication? Side effects that you should report to your care team as soon as possible: Allergic reactions--skin rash, itching, hives, swelling of the face, lips, tongue, or throat Blood clot--pain, swelling, or warmth in the leg, shortness of breath, chest pain Increase in blood pressure Severe back pain, numbness or weakness of the hands, arms, legs, or feet, loss of coordination, loss of bowel or bladder control Side effects that usually do not require medical attention (report these to your care team if they continue or are bothersome): Bone pain Dizziness Fatigue Headache Joint pain Muscle pain Stomach pain This list may not describe all possible side effects. Call your doctor for medical advice about side effects. You may report side effects to FDA at 1-800-FDA-1088. Where should I keep my medication? This medication is given in a hospital or clinic. It will not be stored at home. NOTE: This sheet is a summary. It may not cover all possible information. If you have questions about this medicine, talk to your doctor, pharmacist, or health care provider.  2024 Elsevier/Gold Standard (2023-02-05 00:00:00) Darbepoetin Alfa Injection What is this medication? DARBEPOETIN ALFA (dar be POE e tin AL fa) treats low levels of red blood cells (anemia) caused by  kidney disease or chemotherapy. It works by Systems analyst make more red blood cells, which reduces the need for blood transfusions. This medicine may be used for other purposes; ask your health care provider or pharmacist if you have  questions. COMMON BRAND NAME(S): Aranesp What should I tell my care team before I take this medication? They need to know if you have any of these conditions: Blood clots Cancer Heart disease High blood pressure On dialysis Seizures Stroke An unusual or allergic reaction to darbepoetin, latex, other medications, foods, dyes, or preservatives Pregnant or trying to get pregnant Breast-feeding How should I use this medication? This medication is injected into a vein or under the skin. It is usually given by a care team in a hospital or clinic setting. It may also be given at home. If you get this medication at home, you will be taught how to prepare and give it. Use exactly as directed. Take it as directed on the prescription label at the same time every day. Keep taking it unless your care team tells you to stop. It is important that you put your used needles and syringes in a special sharps container. Do not put them in a trash can. If you do not have a sharps container, call your pharmacist or care team to get one. A special MedGuide will be given to you by the pharmacist with each prescription and refill. Be sure to read this information carefully each time. Talk to your care team about the use of this medication in children. While this medication may be used in children as young as 1 month of age for selected conditions, precautions do apply. Overdosage: If you think you have taken too much of this medicine contact a poison control center or emergency room at once. NOTE: This medicine is only for you. Do not share this medicine with others. What if I miss a dose? If you miss a dose, take it as soon as you can. If it is almost time for your next dose, take only that dose. Do not take double or extra doses. What may interact with this medication? Epoetin alfa Methoxy polyethylene glycol-epoetin beta This list may not describe all possible interactions. Give your health care provider a list  of all the medicines, herbs, non-prescription drugs, or dietary supplements you use. Also tell them if you smoke, drink alcohol, or use illegal drugs. Some items may interact with your medicine. What should I watch for while using this medication? Visit your care team for regular checks on your progress. Check your blood pressure as directed. Know what your blood pressure should be and when to contact your care team. Your condition will be monitored carefully while you are receiving this medication. You may need blood work while taking this medication. What side effects may I notice from receiving this medication? Side effects that you should report to your care team as soon as possible: Allergic reactions--skin rash, itching, hives, swelling of the face, lips, tongue, or throat Blood clot--pain, swelling, or warmth in the leg, shortness of breath, chest pain Heart attack--pain or tightness in the chest, shoulders, arms, or jaw, nausea, shortness of breath, cold or clammy skin, feeling faint or lightheaded Increase in blood pressure Rash, fever, and swollen lymph nodes Redness, blistering, peeling, or loosening of the skin, including inside the mouth Seizures Stroke--sudden numbness or weakness of the face, arm, or leg, trouble speaking, confusion, trouble walking, loss of balance or coordination, dizziness, severe headache, change in  vision Side effects that usually do not require medical attention (report to your care team if they continue or are bothersome): Cough Stomach pain Swelling of the ankles, hands, or feet This list may not describe all possible side effects. Call your doctor for medical advice about side effects. You may report side effects to FDA at 1-800-FDA-1088. Where should I keep my medication? Keep out of the reach of children and pets. Store in a refrigerator. Do not freeze. Do not shake. Protect from light. Keep this medication in the original container until you are ready  to take it. See product for storage information. Get rid of any unused medication after the expiration date. To get rid of medications that are no longer needed or have expired: Take the medication to a medication take-back program. Check with your pharmacy or law enforcement to find a location. If you cannot return the medication, ask your pharmacist or care team how to get rid of the medication safely. NOTE: This sheet is a summary. It may not cover all possible information. If you have questions about this medicine, talk to your doctor, pharmacist, or health care provider.  2024 Elsevier/Gold Standard (2022-01-07 00:00:00)

## 2023-06-26 ENCOUNTER — Other Ambulatory Visit: Payer: Self-pay

## 2023-06-29 ENCOUNTER — Telehealth: Payer: Self-pay | Admitting: Hematology & Oncology

## 2023-06-29 NOTE — Telephone Encounter (Signed)
Spoke with patient and his wife regarding scheduling one dose of IV Iron per order of Dr. Myna Hidalgo. Patient declined any and all sorts of IV iron and requested to never have it again. He stated "Dr. Myna Hidalgo could leave him alone."

## 2023-07-02 ENCOUNTER — Telehealth: Payer: Self-pay

## 2023-07-02 NOTE — Telephone Encounter (Signed)
Received phone call from [place] RN with [facility] who works with Dr. Mendel Ryder. Per Brittney pt was recently discharged home from a nursing home and was following up with patient. Per Brittney RN pt was complaining of itching "all over" from his luspatercept shot and had complained to this office with no help. Brittney was reaching out to this office to see if there was anything that can be done to help patient. This RN discussed pt complaints with pharmacy and Dr. Myna Hidalgo. Per pharmacy itching is not a common side effect unless at injection site. Pharmacy investigated luspatercept and aranesp side effects.  Systemic itching is not a common side effect; mostly just at injection site.  This RN discussed findings with Scientist, forensic and told her that I would call patient. This RN called pt and spoke to wife and patient. Pt states he is having itching 3-4 days after injections "all over but no rash" Pt states the itching moves all over his body and is very uncomfortable. Pt states he never gets a rash. Pt educated on side effects of medication being more localized. Pt denies any changes in laundry detergent. Pt aware that he can take Benadryl OTC as needed for itching. Pt aware that if itching persists the luspatercept or aranesp injections can be held per Dr. Myna Hidalgo. Pt verbalized understanding and had no further questions.

## 2023-07-22 ENCOUNTER — Inpatient Hospital Stay: Payer: Medicare Other

## 2023-07-22 ENCOUNTER — Ambulatory Visit: Payer: Medicare Other | Admitting: Hematology & Oncology

## 2023-08-04 ENCOUNTER — Telehealth: Payer: Self-pay | Admitting: *Deleted

## 2023-08-04 NOTE — Telephone Encounter (Signed)
Patient called and stated,"my fingers turned blue yesterday and are still blue." Can doctor Ennever see me today? Per Dr. Myna Hidalgo, you need to go to the ER and be seen. Patient verbalized understanding.

## 2023-08-11 ENCOUNTER — Other Ambulatory Visit: Payer: Self-pay

## 2023-08-30 ENCOUNTER — Emergency Department (HOSPITAL_BASED_OUTPATIENT_CLINIC_OR_DEPARTMENT_OTHER): Payer: Medicare Other

## 2023-08-30 ENCOUNTER — Emergency Department (HOSPITAL_BASED_OUTPATIENT_CLINIC_OR_DEPARTMENT_OTHER)
Admission: EM | Admit: 2023-08-30 | Discharge: 2023-08-30 | Disposition: A | Discharge status: Other Acute Inpatient Hospital | Payer: Medicare Other | Attending: Emergency Medicine | Admitting: Emergency Medicine

## 2023-08-30 ENCOUNTER — Other Ambulatory Visit: Payer: Self-pay

## 2023-08-30 ENCOUNTER — Encounter (HOSPITAL_BASED_OUTPATIENT_CLINIC_OR_DEPARTMENT_OTHER): Payer: Self-pay | Admitting: Emergency Medicine

## 2023-08-30 DIAGNOSIS — Z794 Long term (current) use of insulin: Secondary | ICD-10-CM | POA: Insufficient documentation

## 2023-08-30 DIAGNOSIS — I509 Heart failure, unspecified: Secondary | ICD-10-CM | POA: Diagnosis not present

## 2023-08-30 DIAGNOSIS — Z7901 Long term (current) use of anticoagulants: Secondary | ICD-10-CM | POA: Insufficient documentation

## 2023-08-30 DIAGNOSIS — R4182 Altered mental status, unspecified: Secondary | ICD-10-CM | POA: Insufficient documentation

## 2023-08-30 DIAGNOSIS — F0390 Unspecified dementia without behavioral disturbance: Secondary | ICD-10-CM | POA: Insufficient documentation

## 2023-08-30 DIAGNOSIS — N184 Chronic kidney disease, stage 4 (severe): Secondary | ICD-10-CM | POA: Insufficient documentation

## 2023-08-30 DIAGNOSIS — E1122 Type 2 diabetes mellitus with diabetic chronic kidney disease: Secondary | ICD-10-CM | POA: Insufficient documentation

## 2023-08-30 DIAGNOSIS — E1165 Type 2 diabetes mellitus with hyperglycemia: Secondary | ICD-10-CM | POA: Diagnosis not present

## 2023-08-30 DIAGNOSIS — D649 Anemia, unspecified: Secondary | ICD-10-CM | POA: Diagnosis not present

## 2023-08-30 DIAGNOSIS — I130 Hypertensive heart and chronic kidney disease with heart failure and stage 1 through stage 4 chronic kidney disease, or unspecified chronic kidney disease: Secondary | ICD-10-CM | POA: Insufficient documentation

## 2023-08-30 DIAGNOSIS — R350 Frequency of micturition: Secondary | ICD-10-CM | POA: Diagnosis present

## 2023-08-30 DIAGNOSIS — R739 Hyperglycemia, unspecified: Secondary | ICD-10-CM

## 2023-08-30 LAB — URINALYSIS, MICROSCOPIC (REFLEX)

## 2023-08-30 LAB — URINALYSIS, ROUTINE W REFLEX MICROSCOPIC
Bilirubin Urine: NEGATIVE
Glucose, UA: 100 mg/dL — AB
Ketones, ur: NEGATIVE mg/dL
Leukocytes,Ua: NEGATIVE
Nitrite: NEGATIVE
Protein, ur: >=300 mg/dL — AB
Specific Gravity, Urine: 1.015 (ref 1.005–1.030)
pH: 5.5 (ref 5.0–8.0)

## 2023-08-30 LAB — BASIC METABOLIC PANEL
Anion gap: 12 (ref 5–15)
BUN: 94 mg/dL — ABNORMAL HIGH (ref 8–23)
CO2: 22 mmol/L (ref 22–32)
Calcium: 8.4 mg/dL — ABNORMAL LOW (ref 8.9–10.3)
Chloride: 100 mmol/L (ref 98–111)
Creatinine, Ser: 2.39 mg/dL — ABNORMAL HIGH (ref 0.61–1.24)
GFR, Estimated: 26 mL/min — ABNORMAL LOW (ref >60)
Glucose, Bld: 347 mg/dL — ABNORMAL HIGH (ref 70–99)
Potassium: 4.4 mmol/L (ref 3.5–5.1)
Sodium: 134 mmol/L — ABNORMAL LOW (ref 135–145)

## 2023-08-30 LAB — CBC
HCT: 26.3 % — ABNORMAL LOW (ref 39.0–52.0)
Hemoglobin: 8.8 g/dL — ABNORMAL LOW (ref 13.0–17.0)
MCH: 28.1 pg (ref 26.0–34.0)
MCHC: 33.5 g/dL (ref 30.0–36.0)
MCV: 84 fL (ref 80.0–100.0)
Platelets: 70 10*3/uL — ABNORMAL LOW (ref 150–400)
RBC: 3.13 MIL/uL — ABNORMAL LOW (ref 4.22–5.81)
RDW: 19.9 % — ABNORMAL HIGH (ref 11.5–15.5)
WBC: 10.3 10*3/uL (ref 4.0–10.5)
nRBC: 0.2 % (ref 0.0–0.2)

## 2023-08-30 LAB — HEPATIC FUNCTION PANEL
ALT: 40 U/L (ref 0–44)
AST: 23 U/L (ref 15–41)
Albumin: 3.4 g/dL — ABNORMAL LOW (ref 3.5–5.0)
Alkaline Phosphatase: 101 U/L (ref 38–126)
Bilirubin, Direct: 0.8 mg/dL — ABNORMAL HIGH (ref 0.0–0.2)
Indirect Bilirubin: 3.9 mg/dL — ABNORMAL HIGH (ref 0.3–0.9)
Total Bilirubin: 4.7 mg/dL — ABNORMAL HIGH (ref <1.2)
Total Protein: 5.5 g/dL — ABNORMAL LOW (ref 6.5–8.1)

## 2023-08-30 LAB — CBG MONITORING, ED
Glucose-Capillary: 329 mg/dL — ABNORMAL HIGH (ref 70–99)
Glucose-Capillary: 349 mg/dL — ABNORMAL HIGH (ref 70–99)
Glucose-Capillary: 350 mg/dL — ABNORMAL HIGH (ref 70–99)
Glucose-Capillary: 370 mg/dL — ABNORMAL HIGH (ref 70–99)

## 2023-08-30 MED ORDER — INSULIN ASPART 100 UNIT/ML IJ SOLN
12.0000 [IU] | Freq: Once | INTRAMUSCULAR | Status: AC
  Administered 2023-08-30: 12 [IU] via SUBCUTANEOUS

## 2023-08-30 MED ORDER — SODIUM BICARBONATE 650 MG PO TABS: 1300.0000 mg | ORAL_TABLET | Freq: Two times a day (BID) | ORAL | Status: DC

## 2023-08-30 MED ORDER — SODIUM CHLORIDE 0.9 % IV BOLUS
500.0000 mL | Freq: Once | INTRAVENOUS | Status: AC
  Administered 2023-08-30: 500 mL via INTRAVENOUS

## 2023-08-30 MED ORDER — APIXABAN 2.5 MG PO TABS: 5.0000 mg | ORAL_TABLET | Freq: Two times a day (BID) | ORAL | Status: DC

## 2023-08-30 MED ORDER — APIXABAN 2.5 MG PO TABS: 2.5000 mg | ORAL_TABLET | Freq: Two times a day (BID) | ORAL | Status: DC

## 2023-08-30 NOTE — ED Provider Notes (Signed)
[place] EMERGENCY DEPARTMENT AT [facility] Provider Note   CSN: [id number] Arrival date & time: 08/30/23  1155     History  Chief Complaint  Patient presents with   Urinary Frequency    Eric [name] is a 82 y.o. male with a complicated medical history as noted below presenting from home with concern from his wife for urinary frequency and confusion.  His wife reports the patient does have some mild developing dementia but has been excessively confused the past several days, confused about going to the bathroom, where the bathroom is located, forgetting basic information.  She says she has been urinating "all day".  She feels this may be related to the long course of steroids that were started on after his last hospitalization at [facility] where he was diagnosed with vasculitis (see below) and prescribed a 7 week prednisone taper.  They are now on Week 3 at 30 mg daily prednisone.  Also started on sodium bicarb by nephrology at that time for CKD.  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 Hospital Course/Treatment: Pt reports presenting to ED for rash to b/l hands and legs x 2 days following a "head cold." ED work-up included BNP 214, Trop 20, BUN 81, Cr 2.34, Tbili 2.3, INR 1.7, WBC 4.24, Hgb 9.9, PLT 102. EKG: Afib HR 77. CXR: pending but no acute findings. Rheumatology consulted. Recommended rheumatological work-up ordered which was largely negative including ANCA, CCP AB, anti-histone, dsDNA Ab, PR3 Ab, Anti GBM Ab, MPO Ab, ANA, RF, anti-SSB and anti-SSA. Patient with no evidence of coagulopathy with normal PT-INR and low concern for infection so no  blood cultures were obtained. Patient was noted to have proteinuria with protein/creatinine ratio of ~2900 mg/g. Dermatology was also consulted. Lesion highly suspicious for vasculitis. Patient underwent punch biopsy with EGS. He was subsequently initiated on IV steroids and kenalog cream. He had improvement in the appearance of his rash. Patient discharged home with prescription for Prednisone taper starting from 60 mg, titrating down by 10 mg every 7 days. Patient was referred to rheumatology, family voiced preference for [facility]. There was also concern for rash associated with allopurinol which was held during hospitalization and held at discharge. Nephrology was consulted due to concern for renal involvement however no recommendation for renal biopsy. Pt reports he was told to stop spironolactone and allopurinol while taking steroid which he was continued to comply with. He was increased on Toujeo to 45 units and started on sodium bicarbonate, sliding scale of Humalog. Currently taking 12 unites TID    HPI     Home Medications Prior to Admission medications   Medication Sig Start Date End Date Taking? Authorizing Provider  acetaminophen (TYLENOL) 325 MG tablet Take 650 mg by mouth every 6 (six) hours as needed for moderate pain.    [provider]  allopurinol (ZYLOPRIM) 100 MG tablet Take 100 mg by mouth daily. 05/15/20   [provider]  apixaban (ELIQUIS) 2.5 MG TABS tablet Take 1 tablet (2.5 mg total) by mouth 2 (two) times daily. 04/29/22   Baldo Daub, MD  Cholecalciferol 125 MCG (5000 UT) TABS Take 5,000 Units by mouth daily. 09/22/22   [provider]  Darbepoetin  Alfa (ARANESP) 150 MCG/0.3ML SOSY injection Inject into the skin.    [provider]  insulin glargine, 1 Unit Dial, (TOUJEO SOLOSTAR) 300 UNIT/ML Solostar Pen Inject 30 Units into the skin daily. Sliding scale 08/31/15   [provider]  insulin lispro (HUMALOG) 100 UNIT/ML  KwikPen Inject 20 Units into the skin 3 (three) times daily. 04/15/15   [provider]  levothyroxine (SYNTHROID) 100 MCG tablet Take 50-100 mcg by mouth See admin instructions. Takes 100 mg daily except Sundays, on Sunday takes 50 mg 01/14/20   [provider]  levothyroxine (SYNTHROID) 100 MCG tablet Take 50 mcg by mouth daily before breakfast. 09/22/22   [provider]  luspatercept-aamt (REBLOZYL) 25 MG subcutaneous injection Inject 1 mg/kg into the skin once.    [provider]  OneTouch Delica Lancets 30G MISC 4 (four) times daily. 10/17/20   [provider]  silodosin (RAPAFLO) 4 MG CAPS capsule Take 4 mg by mouth daily. 05/12/23   [provider]  simvastatin (ZOCOR) 20 MG tablet Take 1 tablet (20 mg total) by mouth at bedtime. 07/06/22   Baldo Daub, MD  spironolactone (ALDACTONE) 50 MG tablet Take 1 tablet (50 mg total) by mouth daily. 04/09/22   Baldo Daub, MD  torsemide (DEMADEX) 20 MG tablet Take 1 tablet (20 mg total) by mouth daily. Take 2nd pill on Monday, Wednesday and Friday 11/02/22   Baldo Daub, MD  traZODone (DESYREL) 50 MG tablet  03/14/20 04/19/20  [provider]      Allergies    Iohexol, Morphine and codeine, Hydroxyzine, Fluconazole, Hydrocodone, Metoprolol, Sulfa antibiotics, Sulfonamide derivatives, Tramadol, Trazodone, Trazodone and nefazodone, and Valtrex [valacyclovir]    Review of Systems   Review of Systems  Physical Exam Updated Vital Signs BP (!) 140/73   Pulse 75   Temp 97.7 F (36.5 C)   Resp 16   Wt 114.1 kg   SpO2 99%   BMI 34.12 kg/m  Physical Exam Constitutional:      General: He is not in acute distress. HENT:     Head: Normocephalic and atraumatic.  Eyes:     Conjunctiva/sclera: Conjunctivae normal.     Pupils: Pupils are equal, round, and reactive to light.  Cardiovascular:     Rate and Rhythm: Normal rate and regular rhythm.  Pulmonary:     Effort: Pulmonary  effort is normal. No respiratory distress.  Abdominal:     General: There is no distension.     Tenderness: There is no abdominal tenderness.  Musculoskeletal:     Comments: Significant edematous symmetrical pitting edema of the lower extremities   Skin:    General: Skin is warm and dry.     Comments: Discolored red rash of the left and right upper extremities  Neurological:     General: No focal deficit present.     Mental Status: He is alert. Mental status is at baseline.  Psychiatric:        Mood and Affect: Mood normal.        Behavior: Behavior normal.     ED Results / Procedures / Treatments   Labs (all labs ordered are listed, but only abnormal results are displayed) Labs Reviewed  URINALYSIS, ROUTINE W REFLEX MICROSCOPIC - Abnormal; Notable for the following components:      Result Value   Glucose, UA 100 (*)    Hgb urine dipstick SMALL (*)    Protein, ur >=300 (*)    All other  components within normal limits  BASIC METABOLIC PANEL - Abnormal; Notable for the following components:   Sodium 134 (*)    Glucose, Bld 347 (*)    BUN 94 (*)    Creatinine, Ser 2.39 (*)    Calcium 8.4 (*)    GFR, Estimated 26 (*)    All other components within normal limits  CBC - Abnormal; Notable for the following components:   RBC 3.13 (*)    Hemoglobin 8.8 (*)    HCT 26.3 (*)    RDW 19.9 (*)    Platelets 70 (*)    All other components within normal limits  URINALYSIS, MICROSCOPIC (REFLEX) - Abnormal; Notable for the following components:   Bacteria, UA RARE (*)    All other components within normal limits  CBG MONITORING, ED - Abnormal; Notable for the following components:   Glucose-Capillary 329 (*)    All other components within normal limits  HEPATIC FUNCTION PANEL    EKG EKG Interpretation Date/Time:  Monday August 30 2023 12:13:15 EST Ventricular Rate:  95 PR Interval:    QRS Duration:  104 QT Interval:  392 QTC Calculation: 432 R Axis:   97  Text  Interpretation: Atrial fibrillation Ventricular tachycardia, unsustained Aberrant complex Right axis deviation Low voltage, precordial leads Confirmed by Alvester Chou [phone]) on 08/30/2023 12:20:46 PM  Radiology DG Chest 2 View  Result Date: 08/30/2023 CLINICAL DATA:  Pneumonia evaluation. EXAM: CHEST - 2 VIEW COMPARISON:  Chest radiograph 08/05/2023. FINDINGS: Left chest pacemaker with single lead projecting over the right ventricle. Low lung volumes accentuate the pulmonary vasculature and cardiomediastinal silhouette. No consolidation or pulmonary edema. No pleural effusion or pneumothorax. IMPRESSION: Low lung volumes without evidence of acute cardiopulmonary disease. Electronically Signed   By: Orvan Falconer M.D.   On: 08/30/2023 15:01   CT Head Wo Contrast  Result Date: 08/30/2023 CLINICAL DATA:  Mental status change, unknown cause. EXAM: CT HEAD WITHOUT CONTRAST TECHNIQUE: Contiguous axial images were obtained from the base of the skull through the vertex without intravenous contrast. RADIATION DOSE REDUCTION: This exam was performed according to the departmental dose-optimization program which includes automated exposure control, adjustment of the mA and/or kV according to patient size and/or use of iterative reconstruction technique. COMPARISON:  Head CT 04/15/2023. FINDINGS: Brain: No acute hemorrhage. Gray-white differentiation is preserved. Unchanged generalized volume loss. No acute hydrocephalus or extra-axial collection. No mass effect or midline shift. Vascular: No hyperdense vessel or unexpected calcification. Skull: No calvarial fracture or suspicious bone lesion. Skull base is unremarkable. Sinuses/Orbits: No acute findings. Other: None. IMPRESSION: No acute intracranial abnormality. Electronically Signed   By: Orvan Falconer M.D.   On: 08/30/2023 15:00    Procedures Procedures    Medications Ordered in ED Medications  insulin aspart (novoLOG) injection 12 Units (has no  administration in time range)  sodium bicarbonate tablet 1,300 mg (has no administration in time range)  apixaban (ELIQUIS) tablet 2.5 mg (has no administration in time range)  sodium chloride 0.9 % bolus 500 mL (500 mLs Intravenous New Bag/Given 08/30/23 1446)    ED Course/ Medical Decision Making/ A&P Clinical Course as of 08/30/23 1557  Mon Aug 30, 2023  1424 [facility] transfer center awaiting callback [MT]  1443 [facility] TC callback to clarify info, awaiting admissions team call [MT]  1541 Trying to transfer to [facility], awaiting call back, recent admit for vasculitis, rheum and derm saw, on 7 week prednisone taper, confused, some baseline dementia but much worse x 2-3  days, increased urination, hyperglycemia but no DKA, trying to re-admit for worsening confusion, rash is improving some [JD]    Clinical Course User Index [JD] Laurence Spates, MD [MT] Renaye Rakers Kermit Balo, MD                                 Medical Decision Making Amount and/or Complexity of Data Reviewed Labs: ordered. Radiology: ordered.   This patient presents to the ED with concern for confusion, excessive urination. This involves an extensive number of treatment options, and is a complaint that carries with it a high risk of complications and morbidity.  The differential diagnosis includes intracranial lesion versus metabolic derangement versus diabetic ketoacidosis versus symptomatic hyperglycemia versus steroid side effects sclerosis other  Co-morbidities that complicate the patient evaluation: Diabetes  Additional history obtained from the patient's wife at bedside  External records from outside source obtained and reviewed including [facility] medical record hospital discharge summary as noted above  I ordered and personally interpreted labs.  The pertinent results include: BUN elevated 94.  Creatinine near baseline at 2.8.  White blood cell count within normal is.  Chronic anemia with hemoglobin stable at 8.8.   Glucose is 347.  UA shows protein without glucose, no evidence of infection, negative nitrites and leukocytes.  I ordered imaging studies including CT scan of the head, x-ray of the chest I independently visualized and interpreted imaging which showed no emergent findings I agree with the radiologist interpretation  The patient was maintained on a cardiac monitor.  I personally viewed and interpreted the cardiac monitored which showed an underlying rhythm of: Rate controlled A-fib  Per my interpretation the patient's ECG shows rate controlled A-fib  I ordered medication including fluid for hydration, elevated BUN.  Home insulin with mealtime  I have reviewed the patients home medicines and have made adjustments as needed  Test Considered: Low suspicion for acute PE, sepsis, meningitis   After the interventions noted above, I reevaluated the patient and found that they have: stayed the same   Dispostion:  Patient is signed out to Dr. Marlene Bast EDP at 350 pm pending callback from [facility] regarding potential transfer to [facility] for AMS, which may be related to steroid use vs vasculitis or another cause.        Final Clinical Impression(s) / ED Diagnoses Final diagnoses:  Hyperglycemia  Altered mental status, unspecified altered mental status type    Rx / DC Orders ED Discharge Orders     None         Terald Sleeper, MD 08/30/23 1557

## 2023-08-30 NOTE — ED Notes (Signed)
Called [facility] for transfer per ED PA request.  Spoke with Marylene Land @13 :45. Callback to ED PA at [phone].

## 2023-08-30 NOTE — ED Notes (Signed)
Called [facility] for ETA on transfer.  No ETA not avaliable at this time.  They will call back with an ETA after 7pm.

## 2023-08-30 NOTE — ED Triage Notes (Signed)
Urinary frequency and odor to his urine x 2 weeks , recent admission for confusion , Hx diabetes .  Presents with bilateral leg edema x 4 days .  Denies chest pain , reports always had shortness of breath .

## 2023-08-30 NOTE — ED Notes (Signed)
Unsuccessful report attempt to RN at this time Eric [name], Charity fundraiser) at [facility] [address] [place]. RN states: "I am not ready for report, let the charge take report." RN then proceeded to take call back number and states that she would call back for report.

## 2023-09-02 ENCOUNTER — Other Ambulatory Visit: Payer: Self-pay

## 2023-09-13 ENCOUNTER — Other Ambulatory Visit: Payer: Self-pay

## 2023-09-15 ENCOUNTER — Other Ambulatory Visit: Payer: Self-pay

## 2023-09-22 DEATH — deceased
